# Patient Record
Sex: Female | Born: 1940 | Hispanic: No | Marital: Married | Smoking: Never smoker
Health system: Southern US, Community
[De-identification: ages and names within clinical notes are randomized; demographics above are authoritative.]

## PROBLEM LIST (undated history)

## (undated) DIAGNOSIS — I10 Essential (primary) hypertension: Secondary | ICD-10-CM

## (undated) DIAGNOSIS — E119 Type 2 diabetes mellitus without complications: Secondary | ICD-10-CM

## (undated) DIAGNOSIS — E7800 Pure hypercholesterolemia, unspecified: Secondary | ICD-10-CM

## (undated) DIAGNOSIS — E039 Hypothyroidism, unspecified: Secondary | ICD-10-CM

## (undated) DIAGNOSIS — I639 Cerebral infarction, unspecified: Secondary | ICD-10-CM

---

## 2000-01-09 ENCOUNTER — Ambulatory Visit (HOSPITAL_COMMUNITY): Admission: RE | Admit: 2000-01-09 | Discharge: 2000-01-09 | Payer: Self-pay | Admitting: *Deleted

## 2000-04-09 ENCOUNTER — Other Ambulatory Visit: Admission: RE | Admit: 2000-04-09 | Discharge: 2000-04-09 | Payer: Self-pay | Admitting: Family Medicine

## 2003-02-09 ENCOUNTER — Emergency Department (HOSPITAL_COMMUNITY): Admission: EM | Admit: 2003-02-09 | Discharge: 2003-02-09 | Payer: Self-pay | Admitting: Emergency Medicine

## 2003-02-10 ENCOUNTER — Ambulatory Visit: Admission: RE | Admit: 2003-02-10 | Discharge: 2003-02-10 | Payer: Self-pay | Admitting: Emergency Medicine

## 2004-03-30 ENCOUNTER — Encounter: Admission: RE | Admit: 2004-03-30 | Discharge: 2004-03-30 | Payer: Self-pay | Admitting: Internal Medicine

## 2005-02-14 ENCOUNTER — Encounter: Admission: RE | Admit: 2005-02-14 | Discharge: 2005-02-14 | Payer: Self-pay | Admitting: Family Medicine

## 2005-02-24 ENCOUNTER — Encounter: Admission: RE | Admit: 2005-02-24 | Discharge: 2005-02-24 | Payer: Self-pay | Admitting: Internal Medicine

## 2006-01-25 ENCOUNTER — Encounter: Admission: RE | Admit: 2006-01-25 | Discharge: 2006-01-25 | Payer: Self-pay | Admitting: Internal Medicine

## 2006-03-05 ENCOUNTER — Encounter: Admission: RE | Admit: 2006-03-05 | Discharge: 2006-03-05 | Payer: Self-pay | Admitting: Internal Medicine

## 2010-05-02 ENCOUNTER — Ambulatory Visit: Payer: Self-pay | Admitting: Vascular Surgery

## 2010-08-07 ENCOUNTER — Encounter: Payer: Self-pay | Admitting: Internal Medicine

## 2010-11-29 NOTE — Consult Note (Signed)
NEW PATIENT CONSULTATION   Jill Rose, Jill Rose  DOB:  Aug 01, 1940                                       05/02/2010  ZOXWR#:[id number]   Patient is a 70 year old female patient referred by Dr. Ralene Ok for  a swollen left leg.  She has a history of eczema in both lower  extremities, left worse than right, for the past 19 years and has been  treated by multiple dermatologists with periodic injections and topical  creams for exacerbations.  She has been having swelling in the left leg  for the last 3 to 4 years, greater than the right leg.  She has no  history of varicose veins, deep venous thrombosis, thrombophlebitis,  stasis ulcers, bleeding, or other known venous issues.  She also has a  history of PE.  She does not wear elastic compression stockings nor  elevate her legs at night and states that in the morning the swelling  has resolved but comes back as the day progresses.   CHRONIC MEDICAL PROBLEMS:  1. Hypothyroidism.  2. Eczema in bilateral lower extremities.  3. Chronic edema, left leg.  4. Negative for diabetes, hypertension, hyperlipidemia, coronary      artery disease, COPD, or stroke.   SOCIAL HISTORY:  She is married, has 3 children, is retired.  She does  not use tobacco or alcohol.   FAMILY HISTORY:  Positive for stroke in her father.  Negative for  coronary disease and diabetes.   REVIEW OF SYSTEMS:  Positive for left ankle discomfort which she  attributes to swelling as the day progresses.  Denies any chest pain,  dyspnea on exertion, PND, orthopnea.  No wheezing.  All other systems in  review of systems are negative.   PHYSICAL EXAMINATION:  Blood pressure 183/88, heart rate 79,  respirations 16.  Generally, she is a well-developed, well-nourished  female in no apparent distress, alert and oriented x3.  HEENT:  Normal  for age.  EOMs intact.  Lungs:  Clear to auscultation.  No rhonchi or  wheezing.  Cardiovascular:  Regular rhythm.  No  murmurs.  Carotid pulses  are 3+.  No bruits.  Abdomen is soft, nontender with no masses palpable.  Musculoskeletal exam is free of major deformities.  Neurologic exam is  normal.  Skin exam reveals 2 large patches of eczema in the left leg,  worse than right.  Left beginning just below the knee, extending  anteriorly along the pretibial area to the lateral malleolus over about  a 20 x 6 cm area.  The right leg has the same distribution but to a less  degree, about 10 x 6 cm.  She has active ulcerations.  There is mild  edema in the left ankle.  She does have 3+ femoral, popliteal, and  dorsalis pedis pulses bilaterally.  I see no bulging varicosities.   Today I ordered a venous duplex exam which I reviewed and interpreted.  Her deep systems are widely patent with some mild reflux in the deep  system.  She has some mild reflux at the saphenofemoral junctions  bilaterally but her saphenous veins are small, and there is some mild  reflux in the left small saphenous vein.   I do not think her venous disease is anything severe enough to require  treatment with laser ablation.  I have recommended elastic  compression  stockings (short-leg) as well as elevation of the legs at night to treat  these symptoms.  If she develops varicosities in the future or swelling  becomes worse we can reassess things at that time.     [name], M.D.  Electronically Signed   JDL/MEDQ  D:  05/02/2010  T:  05/03/2010  Job:  [id number]

## 2010-11-29 NOTE — Procedures (Signed)
LOWER EXTREMITY VENOUS REFLUX EXAM   INDICATION:   EXAM:  Using color-flow imaging and pulse Doppler spectral analysis, the  right and left common femoral, superficial femoral, popliteal, posterior  tibial, greater and lesser saphenous veins are evaluated.  There is  evidence suggesting deep venous insufficiency in the right and left  lower extremities mildly.   The right and left saphenofemoral junctions are not competent with  reflux of >555milliseconds. The right and left GSV's are not competent  with reflux of >526milliseconds with the caliber as described below.   The left proximal short saphenous vein demonstrates incompetency.   GSV Diameter (used if found to be incompetent only)                                            Right    Left  Proximal Greater Saphenous Vein           53 cm    55 cm  Proximal-to-mid-thigh                     cm       cm  Mid thigh                                 25 cm    41 cm  Mid-distal thigh                          cm       cm  Distal thigh                              32 cm    45 cm  Knee                                      cm       cm   IMPRESSION:  1. Right and left greater saphenous vein is not competent with reflux      with >567milliseconds.  2. The right and left greater saphenous vein is not tortuous.  3. The deep venous system is not competent with reflux of      >538milliseconds.  4. The left short saphenous vein is not competent with reflux of      >581milliseconds.   ___________________________________________  [name], M.D.   OD/MEDQ  D:  05/02/2010  T:  05/02/2010  Job:  [id number]

## 2011-12-21 ENCOUNTER — Other Ambulatory Visit: Payer: Self-pay | Admitting: Internal Medicine

## 2011-12-21 DIAGNOSIS — Z1231 Encounter for screening mammogram for malignant neoplasm of breast: Secondary | ICD-10-CM

## 2012-01-23 ENCOUNTER — Ambulatory Visit: Payer: Self-pay

## 2012-01-24 ENCOUNTER — Ambulatory Visit: Payer: Self-pay

## 2012-05-15 ENCOUNTER — Encounter: Payer: Self-pay | Admitting: Internal Medicine

## 2013-10-13 ENCOUNTER — Other Ambulatory Visit: Payer: Self-pay | Admitting: Internal Medicine

## 2013-10-13 DIAGNOSIS — N23 Unspecified renal colic: Secondary | ICD-10-CM

## 2013-10-13 DIAGNOSIS — Z1231 Encounter for screening mammogram for malignant neoplasm of breast: Secondary | ICD-10-CM

## 2013-10-13 DIAGNOSIS — R52 Pain, unspecified: Secondary | ICD-10-CM

## 2013-10-17 ENCOUNTER — Ambulatory Visit
Admission: RE | Admit: 2013-10-17 | Discharge: 2013-10-17 | Disposition: A | Discharge status: Home / Self Care | Payer: Medicare HMO | Source: Ambulatory Visit | Attending: Internal Medicine | Admitting: Internal Medicine

## 2013-10-17 ENCOUNTER — Other Ambulatory Visit: Payer: Self-pay | Admitting: Internal Medicine

## 2013-10-17 DIAGNOSIS — I10 Essential (primary) hypertension: Secondary | ICD-10-CM

## 2013-10-17 DIAGNOSIS — N23 Unspecified renal colic: Secondary | ICD-10-CM

## 2013-10-17 DIAGNOSIS — R52 Pain, unspecified: Secondary | ICD-10-CM

## 2013-10-31 ENCOUNTER — Ambulatory Visit
Admission: RE | Admit: 2013-10-31 | Discharge: 2013-10-31 | Disposition: A | Discharge status: Home / Self Care | Payer: Medicare HMO | Source: Ambulatory Visit | Attending: Internal Medicine | Admitting: Internal Medicine

## 2013-10-31 DIAGNOSIS — Z1231 Encounter for screening mammogram for malignant neoplasm of breast: Secondary | ICD-10-CM

## 2014-12-30 ENCOUNTER — Other Ambulatory Visit: Payer: Self-pay | Admitting: Internal Medicine

## 2014-12-30 DIAGNOSIS — R0989 Other specified symptoms and signs involving the circulatory and respiratory systems: Secondary | ICD-10-CM

## 2015-01-04 ENCOUNTER — Other Ambulatory Visit: Payer: Medicare HMO

## 2016-04-26 ENCOUNTER — Ambulatory Visit: Payer: Medicare HMO | Admitting: Family Medicine

## 2016-07-17 DIAGNOSIS — G459 Transient cerebral ischemic attack, unspecified: Secondary | ICD-10-CM

## 2016-07-17 HISTORY — DX: Transient cerebral ischemic attack, unspecified: G45.9

## 2017-01-26 ENCOUNTER — Inpatient Hospital Stay (HOSPITAL_COMMUNITY)
Admission: EM | Admit: 2017-01-26 | Discharge: 2017-01-28 | DRG: 066 | Disposition: A | Discharge status: Home Health | Payer: Medicare HMO | Attending: Internal Medicine | Admitting: Internal Medicine

## 2017-01-26 ENCOUNTER — Emergency Department (HOSPITAL_COMMUNITY): Payer: Medicare HMO

## 2017-01-26 ENCOUNTER — Encounter (HOSPITAL_COMMUNITY): Payer: Self-pay | Admitting: Internal Medicine

## 2017-01-26 DIAGNOSIS — Z7982 Long term (current) use of aspirin: Secondary | ICD-10-CM

## 2017-01-26 DIAGNOSIS — Z8249 Family history of ischemic heart disease and other diseases of the circulatory system: Secondary | ICD-10-CM

## 2017-01-26 DIAGNOSIS — I160 Hypertensive urgency: Secondary | ICD-10-CM | POA: Diagnosis present

## 2017-01-26 DIAGNOSIS — I161 Hypertensive emergency: Secondary | ICD-10-CM | POA: Diagnosis present

## 2017-01-26 DIAGNOSIS — G459 Transient cerebral ischemic attack, unspecified: Secondary | ICD-10-CM

## 2017-01-26 DIAGNOSIS — R4701 Aphasia: Secondary | ICD-10-CM | POA: Diagnosis not present

## 2017-01-26 DIAGNOSIS — I6523 Occlusion and stenosis of bilateral carotid arteries: Secondary | ICD-10-CM | POA: Diagnosis present

## 2017-01-26 DIAGNOSIS — I639 Cerebral infarction, unspecified: Secondary | ICD-10-CM | POA: Diagnosis not present

## 2017-01-26 DIAGNOSIS — E039 Hypothyroidism, unspecified: Secondary | ICD-10-CM | POA: Diagnosis not present

## 2017-01-26 DIAGNOSIS — R4781 Slurred speech: Secondary | ICD-10-CM | POA: Diagnosis present

## 2017-01-26 DIAGNOSIS — I10 Essential (primary) hypertension: Secondary | ICD-10-CM | POA: Diagnosis present

## 2017-01-26 DIAGNOSIS — R29701 NIHSS score 1: Secondary | ICD-10-CM | POA: Diagnosis not present

## 2017-01-26 DIAGNOSIS — R29700 NIHSS score 0: Secondary | ICD-10-CM | POA: Diagnosis present

## 2017-01-26 DIAGNOSIS — E119 Type 2 diabetes mellitus without complications: Secondary | ICD-10-CM

## 2017-01-26 DIAGNOSIS — R29810 Facial weakness: Secondary | ICD-10-CM | POA: Diagnosis present

## 2017-01-26 HISTORY — DX: Essential (primary) hypertension: I10

## 2017-01-26 HISTORY — DX: Hypothyroidism, unspecified: E03.9

## 2017-01-26 HISTORY — DX: Type 2 diabetes mellitus without complications: E11.9

## 2017-01-26 LAB — COMPREHENSIVE METABOLIC PANEL
ALT: 14 U/L (ref 14–54)
AST: 21 U/L (ref 15–41)
Albumin: 3.7 g/dL (ref 3.5–5.0)
Alkaline Phosphatase: 53 U/L (ref 38–126)
Anion gap: 9 (ref 5–15)
BUN: 12 mg/dL (ref 6–20)
CO2: 27 mmol/L (ref 22–32)
Calcium: 9.1 mg/dL (ref 8.9–10.3)
Chloride: 101 mmol/L (ref 101–111)
Creatinine, Ser: 0.67 mg/dL (ref 0.44–1.00)
GFR calc Af Amer: >60 mL/min (ref >60)
GFR calc non Af Amer: >60 mL/min (ref >60)
Glucose, Bld: 174 mg/dL — ABNORMAL HIGH (ref 65–99)
Potassium: 4.7 mmol/L (ref 3.5–5.1)
Sodium: 137 mmol/L (ref 135–145)
Total Bilirubin: 0.4 mg/dL (ref 0.3–1.2)
Total Protein: 7.9 g/dL (ref 6.5–8.1)

## 2017-01-26 LAB — I-STAT TROPONIN, ED: Troponin i, poc: 0.01 ng/mL (ref 0.00–0.08)

## 2017-01-26 LAB — DIFFERENTIAL
Basophils Absolute: 0 10*3/uL (ref 0.0–0.1)
Basophils Relative: 0 %
Eosinophils Absolute: 0.5 10*3/uL (ref 0.0–0.7)
Eosinophils Relative: 5 %
Lymphocytes Relative: 44 %
Lymphs Abs: 4.4 10*3/uL — ABNORMAL HIGH (ref 0.7–4.0)
Monocytes Absolute: 0.7 10*3/uL (ref 0.1–1.0)
Monocytes Relative: 7 %
Neutro Abs: 4.3 10*3/uL (ref 1.7–7.7)
Neutrophils Relative %: 44 %

## 2017-01-26 LAB — I-STAT CHEM 8, ED
BUN: 11 mg/dL (ref 6–20)
Calcium, Ion: 1.18 mmol/L (ref 1.15–1.40)
Chloride: 101 mmol/L (ref 101–111)
Creatinine, Ser: 0.6 mg/dL (ref 0.44–1.00)
Glucose, Bld: 170 mg/dL — ABNORMAL HIGH (ref 65–99)
HCT: 39 % (ref 36.0–46.0)
Hemoglobin: 13.3 g/dL (ref 12.0–15.0)
Potassium: 4.6 mmol/L (ref 3.5–5.1)
Sodium: 139 mmol/L (ref 135–145)
TCO2: 29 mmol/L (ref 0–100)

## 2017-01-26 LAB — RAPID URINE DRUG SCREEN, HOSP PERFORMED
Amphetamines: NOT DETECTED
Barbiturates: NOT DETECTED
Benzodiazepines: NOT DETECTED
Cocaine: NOT DETECTED
Opiates: NOT DETECTED
Tetrahydrocannabinol: NOT DETECTED

## 2017-01-26 LAB — CBC
HCT: 38.2 % (ref 36.0–46.0)
Hemoglobin: 12.3 g/dL (ref 12.0–15.0)
MCH: 27.3 pg (ref 26.0–34.0)
MCHC: 32.2 g/dL (ref 30.0–36.0)
MCV: 84.7 fL (ref 78.0–100.0)
Platelets: 247 10*3/uL (ref 150–400)
RBC: 4.51 MIL/uL (ref 3.87–5.11)
RDW: 15.4 % (ref 11.5–15.5)
WBC: 9.8 10*3/uL (ref 4.0–10.5)

## 2017-01-26 LAB — PROTIME-INR
INR: 0.93
Prothrombin Time: 12.5 seconds (ref 11.4–15.2)

## 2017-01-26 LAB — CBG MONITORING, ED
Glucose-Capillary: 182 mg/dL — ABNORMAL HIGH (ref 65–99)
Glucose-Capillary: 220 mg/dL — ABNORMAL HIGH (ref 65–99)

## 2017-01-26 LAB — APTT: aPTT: 34 seconds (ref 24–36)

## 2017-01-26 MED ORDER — ACETAMINOPHEN 160 MG/5ML PO SOLN: 650.0000 mg | ORAL | Status: DC | PRN

## 2017-01-26 MED ORDER — NICARDIPINE HCL IN NACL 20-0.86 MG/200ML-% IV SOLN: 3.0000 mg/h | INTRAVENOUS | Status: DC

## 2017-01-26 MED ORDER — ACETAMINOPHEN 325 MG PO TABS: 650.0000 mg | ORAL_TABLET | ORAL | Status: DC | PRN

## 2017-01-26 MED ORDER — HYDRALAZINE HCL 20 MG/ML IJ SOLN
10.0000 mg | INTRAMUSCULAR | Status: AC
  Administered 2017-01-26: 10 mg via INTRAVENOUS
  Filled 2017-01-26: qty 0.5

## 2017-01-26 MED ORDER — ONDANSETRON HCL 4 MG/2ML IJ SOLN: 4.0000 mg | Freq: Three times a day (TID) | INTRAMUSCULAR | Status: DC | PRN

## 2017-01-26 MED ORDER — ENOXAPARIN SODIUM 40 MG/0.4ML ~~LOC~~ SOLN
40.0000 mg | SUBCUTANEOUS | Status: DC
  Administered 2017-01-27 – 2017-01-28 (×2): 40 mg via SUBCUTANEOUS
  Filled 2017-01-26 (×2): qty 0.4

## 2017-01-26 MED ORDER — LISINOPRIL 10 MG PO TABS: 10.0000 mg | ORAL_TABLET | Freq: Every day | ORAL | Status: DC

## 2017-01-26 MED ORDER — ASPIRIN 300 MG RE SUPP: 300.0000 mg | Freq: Every day | RECTAL | Status: DC

## 2017-01-26 MED ORDER — ZOLPIDEM TARTRATE 5 MG PO TABS
5.0000 mg | ORAL_TABLET | Freq: Every evening | ORAL | Status: DC | PRN
Start: 2017-01-26 — End: 2017-01-28

## 2017-01-26 MED ORDER — LISINOPRIL 10 MG PO TABS
10.0000 mg | ORAL_TABLET | Freq: Every day | ORAL | Status: DC
  Administered 2017-01-27 – 2017-01-28 (×3): 10 mg via ORAL
  Filled 2017-01-26 (×3): qty 1

## 2017-01-26 MED ORDER — INSULIN ASPART 100 UNIT/ML ~~LOC~~ SOLN
0.0000 [IU] | Freq: Every day | SUBCUTANEOUS | Status: DC
  Administered 2017-01-27: 2 [IU] via SUBCUTANEOUS
  Filled 2017-01-26: qty 1

## 2017-01-26 MED ORDER — ACETAMINOPHEN 650 MG RE SUPP: 650.0000 mg | RECTAL | Status: DC | PRN

## 2017-01-26 MED ORDER — INSULIN ASPART 100 UNIT/ML ~~LOC~~ SOLN
0.0000 [IU] | Freq: Three times a day (TID) | SUBCUTANEOUS | Status: DC
  Administered 2017-01-27: 3 [IU] via SUBCUTANEOUS
  Administered 2017-01-27 (×2): 2 [IU] via SUBCUTANEOUS
  Administered 2017-01-28: 3 [IU] via SUBCUTANEOUS
  Administered 2017-01-28: 5 [IU] via SUBCUTANEOUS

## 2017-01-26 MED ORDER — STROKE: EARLY STAGES OF RECOVERY BOOK
Freq: Once | Status: DC
  Filled 2017-01-26: qty 1

## 2017-01-26 MED ORDER — ASPIRIN 325 MG PO TABS
325.0000 mg | ORAL_TABLET | Freq: Every day | ORAL | Status: DC
  Administered 2017-01-27 – 2017-01-28 (×2): 325 mg via ORAL
  Filled 2017-01-26 (×2): qty 1

## 2017-01-26 MED ORDER — SENNOSIDES-DOCUSATE SODIUM 8.6-50 MG PO TABS: 1.0000 | ORAL_TABLET | Freq: Every evening | ORAL | Status: DC | PRN

## 2017-01-26 NOTE — ED Notes (Signed)
Pt had drawn for labs: blue

## 2017-01-26 NOTE — ED Notes (Signed)
EKG brought back from triage with patient and put in room. EKG given to Jeraldine LootsLockwood, MD by Clinical research associatewriter.

## 2017-01-26 NOTE — ED Triage Notes (Signed)
Pt c/o slurred speech onset yesterday at 1900, currently resolved. Patient has left lower face droop, family member states this is new. No confusion, vision changes, weakness, numbness, dizziness. No new symptoms today.  Right lower facial droop. CN II-XII otherwise intact to exam. Sensation intact and symmetrical bilaterally to exam.

## 2017-01-26 NOTE — ED Provider Notes (Signed)
[facility]-EMERGENCY DEPT Provider Note   CSN: [id number] Arrival date & time: 01/26/17  1611     History   Chief Complaint Chief Complaint  Patient presents with  . Aphasia    HPI Jill Rose is a 76 y.o. female with a past medical history significant for hypothyroidism and T2DM who presents today from PCP office with resolved episode of slurred speech and facial droop. Patient reports she was watching TV yesterday at home when she was trying to answer her husband in her native language and had some difficulty with her speech that she describes as having a "heavy tongue". Patient also reports that her husband noticed some right sided facial droop. Patient report she took 3 baby aspirin and went to bed. She did not have any other symptoms. This morning, patient was still having some mild slurred speech and after talking to a friend decided to go to her PCP. At her PCP, patient had elevated BP with a SBP in the 190. After describing her symptoms was told to go to the ED for a head CT. Patent denies any shortness of breath, chest pain, difficulty with gait, vision changes, weakness in her upper or lower extremities, no urinary or bowel incontinence.  HPI  Past Medical History:  Diagnosis Date  . Diabetes mellitus without complication (HCC)   . Essential hypertension   . Hypothyroidism     Patient Active Problem List   Diagnosis Date Noted  . Hypertensive emergency 01/26/2017  . TIA (transient ischemic attack) 01/26/2017  . Hypothyroidism 01/26/2017  . Aphasia   . Essential hypertension   . Diabetes mellitus without complication (HCC)     History reviewed. No pertinent surgical history.  OB History    No data available       Home Medications    Prior to Admission medications   Medication Sig Start Date End Date Taking? Authorizing Provider  aspirin EC 81 MG tablet Take 81 mg by mouth daily.   Yes [provider]  Barberry-Oreg Grape-Goldenseal (BERBERINE COMPLEX  PO) Take 500 mg by mouth 3 (three) times daily.   Yes [provider]  Cholecalciferol (VITAMIN D3) 10000 units TABS Take 1 tablet by mouth daily.   Yes [provider]  Coenzyme Q10 (CO Q-10 PO) Take 1 capsule by mouth daily.   Yes [provider]  levothyroxine (SYNTHROID, LEVOTHROID) 150 MCG tablet Take 150 mcg by mouth daily before breakfast.  01/20/17  Yes [provider]  Magnesium 500 MG CAPS Take 1 capsule by mouth daily.   Yes [provider]    Family History Family History  Problem Relation Age of Onset  . Heart disease Mother     Social History Social History  Substance Use Topics  . Smoking status: Never Smoker  . Smokeless tobacco: Never Used  . Alcohol use No     Allergies   Ammonia; Peroxide [hydrogen peroxide]; and Ammonium-containing compounds   Review of Systems Review of Systems  Constitutional: Negative.   HENT: Negative.   Eyes: Negative.   Respiratory: Negative.   Cardiovascular: Negative.   Gastrointestinal: Negative.   Endocrine: Negative.   Genitourinary: Negative.   Musculoskeletal: Negative.   Skin: Negative.   Allergic/Immunologic: Negative.   Neurological: Negative.   Hematological: Negative.   Psychiatric/Behavioral: Negative.      Physical Exam Updated Vital Signs BP (!) 208/67 (BP Location: Left Arm)   Pulse (!) 111   Temp 98.3 F (36.8 C) (Oral)   Resp 19  SpO2 98%   Physical Exam  Constitutional: She is oriented to person, place, and time. She appears well-developed and well-nourished.  HENT:  Head: Normocephalic and atraumatic.  Eyes: Pupils are equal, round, and reactive to light. EOM are normal.  Neck: Normal range of motion. Neck supple.  Cardiovascular: Normal rate and regular rhythm.   Pulmonary/Chest: Effort normal and breath sounds normal.  Abdominal: Soft. Bowel sounds are normal.  Musculoskeletal: Normal range of motion.  Neurological: She is alert and oriented to  person, place, and time.  Psychiatric: She has a normal mood and affect. Her behavior is normal.     ED Treatments / Results  Labs (all labs ordered are listed, but only abnormal results are displayed) Labs Reviewed  DIFFERENTIAL - Abnormal; Notable for the following:       Result Value   Lymphs Abs 4.4 (*)    All other components within normal limits  COMPREHENSIVE METABOLIC PANEL - Abnormal; Notable for the following:    Glucose, Bld 174 (*)    All other components within normal limits  CBG MONITORING, ED - Abnormal; Notable for the following:    Glucose-Capillary 182 (*)    All other components within normal limits  I-STAT CHEM 8, ED - Abnormal; Notable for the following:    Glucose, Bld 170 (*)    All other components within normal limits  CBC  PROTIME-INR  APTT  RAPID URINE DRUG SCREEN, HOSP PERFORMED  TSH  HEMOGLOBIN A1C  LIPID PANEL  I-STAT TROPOININ, ED    EKG  EKG Interpretation  Date/Time:  Friday January 26 2017 16:20:05 EDT Ventricular Rate:  81 PR Interval:    QRS Duration: 75 QT Interval:  399 QTC Calculation: 464 R Axis:   7 Text Interpretation:  Sinus rhythm Consider anterior infarct T wave abnormality Artifact Baseline wander Abnormal ekg Confirmed by Gerhard MunchLockwood, Robert [phone]([id number]) on 01/26/2017 4:37:11 PM       Radiology Ct Head Wo Contrast  Result Date: 01/26/2017 CLINICAL DATA:  Slurred speech beginning at 17 p.m. today. No known injury. EXAM: CT HEAD WITHOUT CONTRAST TECHNIQUE: Contiguous axial images were obtained from the base of the skull through the vertex without intravenous contrast. COMPARISON:  None. FINDINGS: Brain: There is mild cortical atrophy and chronic microvascular ischemic change. No evidence of acute abnormality including hemorrhage, infarct, mass lesion, mass effect, midline shift or abnormal extra-axial fluid collection. No hydrocephalus or pneumocephalus. Vascular: Extensive atherosclerotic vascular disease is seen. Skull: Intact.  Sinuses/Orbits: Negative. Other: None. IMPRESSION: No acute abnormality. Mild atrophy and chronic microvascular ischemic change. Atherosclerosis. Electronically Signed   By: Drusilla Kannerhomas  Dalessio M.D.   On: 01/26/2017 17:35    Procedures Procedures (including critical care time)  Medications Ordered in ED Medications  nicardipine (CARDENE) 20mg  in 0.86% saline 200ml IV infusion (0.1 mg/ml) (not administered)  insulin aspart (novoLOG) injection 0-9 Units (not administered)  insulin aspart (novoLOG) injection 0-5 Units (not administered)   stroke: mapping our early stages of recovery book (not administered)  acetaminophen (TYLENOL) tablet 650 mg (not administered)    Or  acetaminophen (TYLENOL) solution 650 mg (not administered)    Or  acetaminophen (TYLENOL) suppository 650 mg (not administered)  senna-docusate (Senokot-S) tablet 1 tablet (not administered)  enoxaparin (LOVENOX) injection 40 mg (not administered)  aspirin suppository 300 mg (not administered)    Or  aspirin tablet 325 mg (not administered)  ondansetron (ZOFRAN) injection 4 mg (not administered)  zolpidem (AMBIEN) tablet 5 mg (not administered)  lisinopril (PRINIVIL,ZESTRIL) tablet  10 mg (not administered)  hydrALAZINE (APRESOLINE) injection 10 mg (10 mg Intravenous Given 01/26/17 1942)     Initial Impression / Assessment and Plan / ED Course  Patient is 76 yo female who presented with resolved slurred speech, facial droop and hypertension concerning for TIA. Head CT showed chronic microvascular ischemic change but no other acute findings. Neurologic exam is within normal limit with no focal deficit. Symptoms have essentially resolved except for significantly elevated BP 248/86. Patient was started on hydralazine though discussed permissive hypertension. Case was discussed with Neurology who recommended admission for TIA work up. Hospitalist Service was consulted and will admit. Atherosclerosis. I have reviewed the triage  vital signs and the nursing notes.  Pertinent labs & imaging results that were available during my care of the patient were reviewed by me and considered in my medical decision making (see chart for details).     Final Clinical Impressions(s) / ED Diagnoses   Final diagnoses:  Aphasia  Hypertensive urgency  Slurred speech  Essential hypertension  Diabetes mellitus without complication [facility])    New Prescriptions New Prescriptions   No medications on file     Lovena Neighbours, MD 01/26/17 2147    Gerhard Munch, MD 01/28/17 [phone]

## 2017-01-26 NOTE — ED Notes (Signed)
Pt had drawn for labs: Gold Lavender Lt green Dark green

## 2017-01-26 NOTE — ED Notes (Signed)
Patient transported to CT 

## 2017-01-26 NOTE — H&P (Signed)
History and Physical    ARLEEN BAR ZOX:[id number] DOB: 05-11-1941 DOA: 01/26/2017  Referring MD/NP/PA:   PCP: Patient, No Pcp Per   Patient coming from:  The patient is coming from home.  At baseline, pt is independent for most of ADL.   Chief Complaint: Slurred speech  HPI: EILEENE KISLING is a 76 y.o. female with medical history significant of hypertension, diabetes mellitus, hypothyroidism, who presents with slurred speech.   Pt states that she had slurred speech that happened at about 19:00 Yesterday. She took 3 pills of 81 mg ASA and went to bed, her symptoms has resolved in the morning when she woke up. Patient denies unilateral weakness, numbness, vision change or hearing loss. She was noted to have right facial droop.  Patient does not have chest pain, shortness breath, cough, fever, chills, nausea, vomiting, diarrhea, abdominal pain, symptoms of UTI. Patient states that she has hx of diabetes, is taking nature medication 'Berberine' which is equivalent to metformin per her doctor in [place]. She has hx of hypertension, but currently not taking medications.   ED Course: pt was found to have elevated blood pressure 248/86, WBC 9.8, INR 0.93, electrolytes and rnal function okay, temperature normal, no tachycardia, oxygen saturation 100% on room air, negative CT head for acute intracranial abnormalities. Patient is placed on stepdown bed for observation. Neurology, Dr. Wilford Corner was consulted.  Review of Systems:   General: no fevers, chills, no changes in body weight, has fatigue HEENT: no blurry vision, hearing changes or sore throat Respiratory: no dyspnea, coughing, wheezing CV: no chest pain, no palpitations GI: no nausea, vomiting, abdominal pain, diarrhea, constipation GU: no dysuria, burning on urination, increased urinary frequency, hematuria  Ext: no leg edema Neuro: has slurred speech and right facial droop. No unilateral weakness, numbness, or tingling in extremities. No  vision change or hearing loss Skin: no rash, no skin tear. MSK: No muscle spasm, no deformity, no limitation of range of movement in spin Heme: No easy bruising.  Travel history: No recent long distant travel.  Allergy:  Allergies  Allergen Reactions  . Ammonia Shortness Of Breath, Itching and Swelling  . Peroxide [Hydrogen Peroxide] Shortness Of Breath, Itching and Swelling  . Ammonium-Containing Compounds     Past Medical History:  Diagnosis Date  . Diabetes mellitus without complication (HCC)   . Essential hypertension   . Hypothyroidism     History reviewed. No pertinent surgical history.  Social History:  reports that she has never smoked. She has never used smokeless tobacco. She reports that she does not drink alcohol or use drugs.  Family History:  Family History  Problem Relation Age of Onset  . Heart disease Mother      Prior to Admission medications   Medication Sig Start Date End Date Taking? Authorizing Provider  aspirin EC 81 MG tablet Take 81 mg by mouth daily.   Yes [provider]  Barberry-Oreg Grape-Goldenseal (BERBERINE COMPLEX PO) Take 500 mg by mouth 3 (three) times daily.   Yes [provider]  Cholecalciferol (VITAMIN D3) 10000 units TABS Take 1 tablet by mouth daily.   Yes [provider]  Coenzyme Q10 (CO Q-10 PO) Take 1 capsule by mouth daily.   Yes [provider]  levothyroxine (SYNTHROID, LEVOTHROID) 150 MCG tablet Take 150 mcg by mouth daily before breakfast.  01/20/17  Yes [provider]  Magnesium 500 MG CAPS Take 1 capsule by mouth daily.   Yes [provider]  Physical Exam: Vitals:   01/26/17 1915 01/26/17 1930 01/26/17 1942 01/26/17 2025  BP: (!) 227/86 (!) 248/86 (!) 248/86 (!) 209/66  Pulse: 77 80  (!) 115  Resp: 16 16  (!) 22  Temp:    (!) 97.4 F (36.3 C)  TempSrc:    Oral  SpO2: 100% 99%  99%   General: Not in acute distress HEENT:       Eyes: PERRL, EOMI, no scleral  icterus.       ENT: No discharge from the ears and nose, no pharynx injection, no tonsillar enlargement.        Neck: No JVD, no bruit, no mass felt. Heme: No neck lymph node enlargement. Cardiac: S1/S2, RRR, No murmurs, No gallops or rubs. Respiratory:  No rales, wheezing, rhonchi or rubs. GI: Soft, nondistended, nontender, no rebound pain, no organomegaly, BS present. GU: No hematuria Ext: No pitting leg edema bilaterally. 2+DP/PT pulse bilaterally. Musculoskeletal: No joint deformities, No joint redness or warmth, no limitation of ROM in spin. Skin: No rashes.  Neuro: Alert, oriented X3, cranial nerves II-XII grossly intact except for mild right facial droop,  moves all extremities normally. Muscle strength 5/5 in all extremities, sensation to light touch intact. Brachial reflex 2+ bilaterally. Negative Babinski's sign. Normal finger to nose test. Psych: Patient is not psychotic, no suicidal or hemocidal ideation.  Labs on Admission: I have personally reviewed following labs and imaging studies  CBC:  Recent Labs Lab 01/26/17 1702 01/26/17 1747  WBC 9.8  --   NEUTROABS 4.3  --   HGB 12.3 13.3  HCT 38.2 39.0  MCV 84.7  --   PLT 247  --    Basic Metabolic Panel:  Recent Labs Lab 01/26/17 1744 01/26/17 1747  NA 137 139  K 4.7 4.6  CL 101 101  CO2 27  --   GLUCOSE 174* 170*  BUN 12 11  CREATININE 0.67 0.60  CALCIUM 9.1  --    GFR: CrCl cannot be calculated (Unknown ideal weight.). Liver Function Tests:  Recent Labs Lab 01/26/17 1744  AST 21  ALT 14  ALKPHOS 53  BILITOT 0.4  PROT 7.9  ALBUMIN 3.7   No results for input(s): LIPASE, AMYLASE in the last 168 hours. No results for input(s): AMMONIA in the last 168 hours. Coagulation Profile:  Recent Labs Lab 01/26/17 1800  INR 0.93   Cardiac Enzymes: No results for input(s): CKTOTAL, CKMB, CKMBINDEX, TROPONINI in the last 168 hours. BNP (last 3 results) No results for input(s): PROBNP in the last 8760  hours. HbA1C: No results for input(s): HGBA1C in the last 72 hours. CBG:  Recent Labs Lab 01/26/17 1657  GLUCAP 182*   Lipid Profile: No results for input(s): CHOL, HDL, LDLCALC, TRIG, CHOLHDL, LDLDIRECT in the last 72 hours. Thyroid Function Tests: No results for input(s): TSH, T4TOTAL, FREET4, T3FREE, THYROIDAB in the last 72 hours. Anemia Panel: No results for input(s): VITAMINB12, FOLATE, FERRITIN, TIBC, IRON, RETICCTPCT in the last 72 hours. Urine analysis: No results found for: COLORURINE, APPEARANCEUR, LABSPEC, PHURINE, GLUCOSEU, HGBUR, BILIRUBINUR, KETONESUR, PROTEINUR, UROBILINOGEN, NITRITE, LEUKOCYTESUR Sepsis Labs: @LABRCNTIP (procalcitonin:4,lacticidven:4) )No results found for this or any previous visit (from the past 240 hour(s)).   Radiological Exams on Admission: Ct Head Wo Contrast  Result Date: 01/26/2017 CLINICAL DATA:  Slurred speech beginning at 17 p.m. today. No known injury. EXAM: CT HEAD WITHOUT CONTRAST TECHNIQUE: Contiguous axial images were obtained from the base of the skull through the vertex without intravenous contrast. COMPARISON:  None. FINDINGS: Brain: There is mild cortical atrophy and chronic microvascular ischemic change. No evidence of acute abnormality including hemorrhage, infarct, mass lesion, mass effect, midline shift or abnormal extra-axial fluid collection. No hydrocephalus or pneumocephalus. Vascular: Extensive atherosclerotic vascular disease is seen. Skull: Intact. Sinuses/Orbits: Negative. Other: None. IMPRESSION: No acute abnormality. Mild atrophy and chronic microvascular ischemic change. Atherosclerosis. Electronically Signed   By: Drusilla Kannerhomas  Dalessio M.D.   On: 01/26/2017 17:35     EKG: Independently reviewed.  Sinus rhythm, QTC 464, poor R-wave progression, nonspecific T-wave change.  Assessment/Plan Principal Problem:   Hypertensive emergency Active Problems:   TIA (transient ischemic attack)   Hypothyroidism   Essential  hypertension   Diabetes mellitus without complication (HCC)   Hypertensive emergency: Blood pressure elevated at 248/86, with slurred speech and right facial droop, indicating possible TIA. Mental status normal. No chest pain. Renal function okay. Pt received 10 mg of IV hydralazine ED, but the blood pressure is still elevated.   -will place on SDU for -start Cardene gtt targeting SBP 170-190 mmHg. -start Lisinopril 10 mg daily  Possible TIA: pt has right facial droop and slurred speech, her slurred speech has resolved. CT head is negative for acute intracranial abnormalities. Neurology, Dr. Wilford CornerArora was consulted by EDP.  - Neurology was consulted by EDP, will follow up recommendations. -Atrial fibrillation: not present  - Risk factor modification: HgbA1c, fasting lipid panel - MRI, MRA of the brain without contrast  - PT consult, OT consult - Bedside swallowing screen was ordered, will get speech consult in AM - 2 d Echocardiogram  - Carotid dopplers  - Aspirin 325 mg daily - check UDS  Hypothyroidism: Last TSH was not on record -Continue home Synthroid -Check TSH  Essential hypertension: not taking meds at home. Now has hypertensive emergency -See above  DM-II: Last A1c not on record. Patient states that she is taking nature medication 'Berberine' which is equivalent to metformin per her doctor in [place]. Blood sugar is 174. -SSI -Check A1c  DVT ppx: SQ Lovenox Code Status: Full code Family Communication:   Yes, patient's husband and son at bed side Disposition Plan:  Anticipate discharge back to previous home environment Consults called:  Neuro, dr. Wilford CornerArora Admission status:   SDU/obs  Date of Service 01/26/2017    Lorretta HarpNIU, Crispin Vogel [facility] Pager [phone]  If 7PM-7AM, please contact night-coverage [email or web address] Password [facility] 01/26/2017, 9:17 PM

## 2017-01-27 ENCOUNTER — Observation Stay (HOSPITAL_COMMUNITY): Payer: Medicare HMO

## 2017-01-27 ENCOUNTER — Encounter (HOSPITAL_COMMUNITY): Payer: Medicare HMO

## 2017-01-27 DIAGNOSIS — Z7982 Long term (current) use of aspirin: Secondary | ICD-10-CM | POA: Diagnosis not present

## 2017-01-27 DIAGNOSIS — I639 Cerebral infarction, unspecified: Secondary | ICD-10-CM | POA: Diagnosis present

## 2017-01-27 DIAGNOSIS — I161 Hypertensive emergency: Secondary | ICD-10-CM | POA: Diagnosis not present

## 2017-01-27 DIAGNOSIS — I63312 Cerebral infarction due to thrombosis of left middle cerebral artery: Secondary | ICD-10-CM | POA: Diagnosis not present

## 2017-01-27 DIAGNOSIS — I6523 Occlusion and stenosis of bilateral carotid arteries: Secondary | ICD-10-CM | POA: Diagnosis present

## 2017-01-27 DIAGNOSIS — I160 Hypertensive urgency: Secondary | ICD-10-CM

## 2017-01-27 DIAGNOSIS — E039 Hypothyroidism, unspecified: Secondary | ICD-10-CM | POA: Diagnosis present

## 2017-01-27 DIAGNOSIS — R4701 Aphasia: Secondary | ICD-10-CM | POA: Diagnosis present

## 2017-01-27 DIAGNOSIS — I10 Essential (primary) hypertension: Secondary | ICD-10-CM

## 2017-01-27 DIAGNOSIS — G459 Transient cerebral ischemic attack, unspecified: Secondary | ICD-10-CM | POA: Diagnosis not present

## 2017-01-27 DIAGNOSIS — R29701 NIHSS score 1: Secondary | ICD-10-CM | POA: Diagnosis present

## 2017-01-27 DIAGNOSIS — R29810 Facial weakness: Secondary | ICD-10-CM | POA: Diagnosis present

## 2017-01-27 DIAGNOSIS — Z8249 Family history of ischemic heart disease and other diseases of the circulatory system: Secondary | ICD-10-CM | POA: Diagnosis not present

## 2017-01-27 DIAGNOSIS — R29700 NIHSS score 0: Secondary | ICD-10-CM | POA: Diagnosis present

## 2017-01-27 DIAGNOSIS — E119 Type 2 diabetes mellitus without complications: Secondary | ICD-10-CM | POA: Diagnosis present

## 2017-01-27 DIAGNOSIS — R4781 Slurred speech: Secondary | ICD-10-CM | POA: Diagnosis present

## 2017-01-27 LAB — LIPID PANEL
Cholesterol: 237 mg/dL — ABNORMAL HIGH (ref 0–200)
HDL: 50 mg/dL (ref >40)
LDL Cholesterol: 168 mg/dL — ABNORMAL HIGH (ref 0–99)
Total CHOL/HDL Ratio: 4.7 RATIO
Triglycerides: 96 mg/dL (ref <150)
VLDL: 19 mg/dL (ref 0–40)

## 2017-01-27 LAB — GLUCOSE, CAPILLARY
Glucose-Capillary: 182 mg/dL — ABNORMAL HIGH (ref 65–99)
Glucose-Capillary: 219 mg/dL — ABNORMAL HIGH (ref 65–99)
Glucose-Capillary: 184 mg/dL — ABNORMAL HIGH (ref 65–99)
Glucose-Capillary: 196 mg/dL — ABNORMAL HIGH (ref 65–99)

## 2017-01-27 LAB — MRSA PCR SCREENING: MRSA by PCR: NEGATIVE

## 2017-01-27 LAB — TSH: TSH: 43.122 u[IU]/mL — ABNORMAL HIGH (ref 0.350–4.500)

## 2017-01-27 MED ORDER — ATORVASTATIN CALCIUM 40 MG PO TABS
40.0000 mg | ORAL_TABLET | Freq: Every day | ORAL | Status: DC
  Administered 2017-01-27: 40 mg via ORAL
  Filled 2017-01-27: qty 1

## 2017-01-27 NOTE — Progress Notes (Signed)
PT Cancellation Note  Patient Details Name: Jill Rose MRN: [id number] DOB: 12/02/1940   Cancelled Treatment:    Reason Eval/Treat Not Completed: Patient not medically ready Patient remains on bedrest per orders.   MD:  Please write activity orders when appropriate for patient.  PT will initiate evaluation at that time.  Thank you.  Vena AustriaSusan H Jahir Halt 01/27/2017, 12:56 PM Durenda HurtSusan H. Renaldo Fiddleravis, PT, [facility] Acute Rehab Services Pager [phone]'

## 2017-01-27 NOTE — ED Notes (Signed)
[facility] called for transport to [place] 

## 2017-01-27 NOTE — Consult Note (Signed)
Reason for Consult: Dysarthria Referring Physician: Dr. Myra Gianotti is an 76 y.o. female.  HPI: Thursday night, she developed acute onset of dysarthria.  She denies any weakness, numbness, or incoordination at any time.  No headaches.  No vertigo.  She normally takes ASA 162 mg per day, but took 243 mg instead and went to bed.  When she woke up in the morning, she still had dysarthria which is more noticeable if she spoke in her native language.  CT Brain is normal except for mild small vessel ischemic disease.  She has a history of HTN and DM, but does not take medication or takes herbal supplements for them.  She was not on a statin.    Past Medical History:  Diagnosis Date  . Diabetes mellitus without complication ([place])   . Essential hypertension   . Hypothyroidism     History reviewed. No pertinent surgical history.  Family History  Problem Relation Age of Onset  . Heart disease Mother     Social History:  reports that she has never smoked. She has never used smokeless tobacco. She reports that she does not drink alcohol or use drugs.  Allergies:  Allergies  Allergen Reactions  . Ammonia Shortness Of Breath, Itching and Swelling  . Peroxide [Hydrogen Peroxide] Shortness Of Breath, Itching and Swelling  . Ammonium-Containing Compounds     Prior to Admission medications   Medication Sig Start Date End Date Taking? Authorizing Provider  aspirin EC 81 MG tablet Take 81 mg by mouth daily.   Yes [provider]  Barberry-Oreg Grape-Goldenseal (BERBERINE COMPLEX PO) Take 500 mg by mouth 3 (three) times daily.   Yes [provider]  Cholecalciferol (VITAMIN D3) 10000 units TABS Take 1 tablet by mouth daily.   Yes [provider]  Coenzyme Q10 (CO Q-10 PO) Take 1 capsule by mouth daily.   Yes [provider]  levothyroxine (SYNTHROID, LEVOTHROID) 150 MCG tablet Take 150 mcg by mouth daily before breakfast.  01/20/17  Yes [provider]  Magnesium 500 MG CAPS Take 1 capsule by mouth daily.   Yes [provider]    Medications:  Scheduled: .  stroke: mapping our early stages of recovery book   Does not apply Once  . aspirin  300 mg Rectal Daily   Or  . aspirin  325 mg Oral Daily  . enoxaparin (LOVENOX) injection  40 mg Subcutaneous Q24H  . insulin aspart  0-5 Units Subcutaneous QHS  . insulin aspart  0-9 Units Subcutaneous TID WC  . lisinopril  10 mg Oral Daily    Results for orders placed or performed during the hospital encounter of 01/26/17 (from the past 48 hour(s))  CBG monitoring, ED     Status: Abnormal   Collection Time: 01/26/17  4:57 PM  Result Value Ref Range   Glucose-Capillary 182 (H) 65 - 99 mg/dL  CBC     Status: None   Collection Time: 01/26/17  5:02 PM  Result Value Ref Range   WBC 9.8 4.0 - 10.5 K/uL   RBC 4.51 3.87 - 5.11 MIL/uL   Hemoglobin 12.3 12.0 - 15.0 g/dL   HCT 38.2 36.0 - 46.0 %   MCV 84.7 78.0 - 100.0 fL   MCH 27.3 26.0 - 34.0 pg   MCHC 32.2 30.0 - 36.0 g/dL   RDW 15.4 11.5 - 15.5 %   Platelets 247 150 - 400 K/uL  Differential     Status: Abnormal  Collection Time: 01/26/17  5:02 PM  Result Value Ref Range   Neutrophils Relative % 44 %   Neutro Abs 4.3 1.7 - 7.7 K/uL   Lymphocytes Relative 44 %   Lymphs Abs 4.4 (H) 0.7 - 4.0 K/uL   Monocytes Relative 7 %   Monocytes Absolute 0.7 0.1 - 1.0 K/uL   Eosinophils Relative 5 %   Eosinophils Absolute 0.5 0.0 - 0.7 K/uL   Basophils Relative 0 %   Basophils Absolute 0.0 0.0 - 0.1 K/uL  I-stat troponin, ED     Status: None   Collection Time: 01/26/17  5:14 PM  Result Value Ref Range   Troponin i, poc 0.01 0.00 - 0.08 ng/mL   Comment 3            Comment: Due to the release kinetics of cTnI, a negative result within the first hours of the onset of symptoms does not rule out myocardial infarction with certainty. If myocardial infarction is still suspected, repeat the test at appropriate intervals.   Comprehensive  metabolic panel     Status: Abnormal   Collection Time: 01/26/17  5:44 PM  Result Value Ref Range   Sodium 137 135 - 145 mmol/L   Potassium 4.7 3.5 - 5.1 mmol/L   Chloride 101 101 - 111 mmol/L   CO2 27 22 - 32 mmol/L   Glucose, Bld 174 (H) 65 - 99 mg/dL   BUN 12 6 - 20 mg/dL   Creatinine, Ser 0.67 0.44 - 1.00 mg/dL   Calcium 9.1 8.9 - 10.3 mg/dL   Total Protein 7.9 6.5 - 8.1 g/dL   Albumin 3.7 3.5 - 5.0 g/dL   AST 21 15 - 41 U/L   ALT 14 14 - 54 U/L   Alkaline Phosphatase 53 38 - 126 U/L   Total Bilirubin 0.4 0.3 - 1.2 mg/dL   GFR calc non Af Amer >60 >60 mL/min   GFR calc Af Amer >60 >60 mL/min    Comment: (NOTE) The eGFR has been calculated using the CKD EPI equation. This calculation has not been validated in all clinical situations. eGFR's persistently <60 mL/min signify possible Chronic Kidney Disease.    Anion gap 9 5 - 15  I-Stat Chem 8, ED     Status: Abnormal   Collection Time: 01/26/17  5:47 PM  Result Value Ref Range   Sodium 139 135 - 145 mmol/L   Potassium 4.6 3.5 - 5.1 mmol/L   Chloride 101 101 - 111 mmol/L   BUN 11 6 - 20 mg/dL   Creatinine, Ser 0.60 0.44 - 1.00 mg/dL   Glucose, Bld 170 (H) 65 - 99 mg/dL   Calcium, Ion 1.18 1.15 - 1.40 mmol/L   TCO2 29 0 - 100 mmol/L   Hemoglobin 13.3 12.0 - 15.0 g/dL   HCT 39.0 36.0 - 46.0 %  Protime-INR     Status: None   Collection Time: 01/26/17  6:00 PM  Result Value Ref Range   Prothrombin Time 12.5 11.4 - 15.2 seconds   INR 0.93   APTT     Status: None   Collection Time: 01/26/17  6:00 PM  Result Value Ref Range   aPTT 34 24 - 36 seconds  Rapid urine drug screen (hospital performed)     Status: None   Collection Time: 01/26/17  7:39 PM  Result Value Ref Range   Opiates NONE DETECTED NONE DETECTED   Cocaine NONE DETECTED NONE DETECTED   Benzodiazepines NONE DETECTED NONE DETECTED  Amphetamines NONE DETECTED NONE DETECTED   Tetrahydrocannabinol NONE DETECTED NONE DETECTED   Barbiturates NONE DETECTED NONE  DETECTED    Comment:        DRUG SCREEN FOR MEDICAL PURPOSES ONLY.  IF CONFIRMATION IS NEEDED FOR ANY PURPOSE, NOTIFY LAB WITHIN 5 DAYS.        LOWEST DETECTABLE LIMITS FOR URINE DRUG SCREEN Drug Class       Cutoff (ng/mL) Amphetamine      1000 Barbiturate      200 Benzodiazepine   863 Tricyclics       817 Opiates          300 Cocaine          300 THC              50   CBG monitoring, ED     Status: Abnormal   Collection Time: 01/26/17 10:13 PM  Result Value Ref Range   Glucose-Capillary 220 (H) 65 - 99 mg/dL  MRSA PCR Screening     Status: None   Collection Time: 01/27/17  2:45 AM  Result Value Ref Range   MRSA by PCR NEGATIVE NEGATIVE    Comment:        The GeneXpert MRSA Assay (FDA approved for NASAL specimens only), is one component of a comprehensive MRSA colonization surveillance program. It is not intended to diagnose MRSA infection nor to guide or monitor treatment for MRSA infections.     Ct Head Wo Contrast  Result Date: 01/26/2017 CLINICAL DATA:  Slurred speech beginning at 17 p.m. today. No known injury. EXAM: CT HEAD WITHOUT CONTRAST TECHNIQUE: Contiguous axial images were obtained from the base of the skull through the vertex without intravenous contrast. COMPARISON:  None. FINDINGS: Brain: There is mild cortical atrophy and chronic microvascular ischemic change. No evidence of acute abnormality including hemorrhage, infarct, mass lesion, mass effect, midline shift or abnormal extra-axial fluid collection. No hydrocephalus or pneumocephalus. Vascular: Extensive atherosclerotic vascular disease is seen. Skull: Intact. Sinuses/Orbits: Negative. Other: None. IMPRESSION: No acute abnormality. Mild atrophy and chronic microvascular ischemic change. Atherosclerosis. Electronically Signed   By: Inge Rise M.D.   On: 01/26/2017 17:35    ROS Blood pressure (!) 169/73, pulse 96, temperature 97.6 F (36.4 C), temperature source Oral, resp. rate 16, weight 80.7  kg (178 lb), SpO2 98 %. Neurologic Examination:  Very mild and subtle dysarthria.   Fluent.   Comprehension, naming, repetition- intact. Face symmetric. Tongue midline. Palate raises symmetrically.   Strength 5/5 BUE and BLE.   Coord- FTN, HTS, RAM intact bil. No babinski.  No hoffman's.    Assessment/Plan:  History of isolated mild dysarthria without any other neurological symptoms.  Presumably, she may have had a subcortical small tiny infarct causing this.    Awaiting MRI/MRA Brain.  Go ahead and do TTE and CDUS because we would do it if it was a TIA too.    Consider changing ASA to Plavix as may constitute Aspirin failure.    Her risk factors are poorly controlled and need to be modified.    Rogue Jury, MD 01/27/2017, 4:14 AM

## 2017-01-27 NOTE — Progress Notes (Signed)
[facility] PROGRESS NOTE  Jill Rose ZOX:[id number] DOB: 04/19/1941 DOA: 01/26/2017 PCP: Patient, No Pcp Per  Brief summary   76 y.o. female with a past medical history significant for hypothyroidism and T2DM who presents from PCP office with resolved episode of slurred speech and facial droop. Patient reports she was watching TV yesterday at home when she was trying to answer her husband in her native language and had some difficulty with her speech that she describes as having a "heavy tongue". Patient also reports that her husband noticed some right sided facial droop. Patient report she took 3 baby aspirin and went to bed. She did not have any other symptoms. This morning, patient was still having some mild slurred speech and after talking to a friend decided to go to her PCP. At her PCP, patient had elevated BP with a SBP in the 190. After describing her symptoms was told to go to the ED for a head CT. Patent denies any shortness of breath, chest pain, difficulty with gait, vision changes, weakness in her upper or lower extremities, no urinary or bowel incontinence.  -ED BP is elevated. Up to 248/86.    Assessment/Plan:  Slurred speech. Unclear etiology. Probable TIA vs hypertension related. Ongoing work up to r/o stroke. Pend mra, echo, carotids, lipids, ha1c.  -symptoms have resolved. Neuro exam is non focal, ct head: no acute infarcts.  Cont aspirin, neuro checks.  monitor   HTN Uregncy. Not on meds at home. Improved on lisinopril. Will adjust add second gent as needed.  DM. Unclear history. Reports taking barbery at home. Will check ha1c Hypothyroidism, f/u TSH  Code Status: full Family Communication: d/w patient, her hsuband (indicate person spoken with, relationship, and if by phone, the number) Disposition Plan: home pend stroke work up   Consultants:  neuro  Procedures:  Pend echo  Antibiotics:  none (indicate start date, and stop date if  known)  HPI/Subjective: Alert, oriented. Reports feeling better, no focal neuro symptoms   Objective: Vitals:   01/27/17 0600 01/27/17 0743  BP: (!) 161/65 (!) 168/74  Pulse: 83   Resp: 11   Temp:  97.9 F (36.6 C)   No intake or output data in the 24 hours ending 01/27/17 0812 Filed Weights   01/27/17 0206 01/27/17 0240  Weight: 80.7 kg (178 lb) 80.7 kg (178 lb)    Exam:   General:  No distress   Cardiovascular: s1,s2 rrr  Respiratory: CTA BL  Abdomen: soft, nt, nd   Musculoskeletal: no leg edema    Data Reviewed: Basic Metabolic Panel:  Recent Labs Lab 01/26/17 1744 01/26/17 1747  NA 137 139  K 4.7 4.6  CL 101 101  CO2 27  --   GLUCOSE 174* 170*  BUN 12 11  CREATININE 0.67 0.60  CALCIUM 9.1  --    Liver Function Tests:  Recent Labs Lab 01/26/17 1744  AST 21  ALT 14  ALKPHOS 53  BILITOT 0.4  PROT 7.9  ALBUMIN 3.7   No results for input(s): LIPASE, AMYLASE in the last 168 hours. No results for input(s): AMMONIA in the last 168 hours. CBC:  Recent Labs Lab 01/26/17 1702 01/26/17 1747  WBC 9.8  --   NEUTROABS 4.3  --   HGB 12.3 13.3  HCT 38.2 39.0  MCV 84.7  --   PLT 247  --    Cardiac Enzymes: No results for input(s): CKTOTAL, CKMB, CKMBINDEX, TROPONINI in the last 168 hours. BNP (last 3  results) No results for input(s): BNP in the last 8760 hours.  ProBNP (last 3 results) No results for input(s): PROBNP in the last 8760 hours.  CBG:  Recent Labs Lab 01/26/17 1657 01/26/17 2213  GLUCAP 182* 220*    Recent Results (from the past 240 hour(s))  MRSA PCR Screening     Status: None   Collection Time: 01/27/17  2:45 AM  Result Value Ref Range Status   MRSA by PCR NEGATIVE NEGATIVE Final    Comment:        The GeneXpert MRSA Assay (FDA approved for NASAL specimens only), is one component of a comprehensive MRSA colonization surveillance program. It is not intended to diagnose MRSA infection nor to guide or monitor  treatment for MRSA infections.      Studies: Ct Head Wo Contrast  Result Date: 01/26/2017 CLINICAL DATA:  Slurred speech beginning at 17 p.m. today. No known injury. EXAM: CT HEAD WITHOUT CONTRAST TECHNIQUE: Contiguous axial images were obtained from the base of the skull through the vertex without intravenous contrast. COMPARISON:  None. FINDINGS: Brain: There is mild cortical atrophy and chronic microvascular ischemic change. No evidence of acute abnormality including hemorrhage, infarct, mass lesion, mass effect, midline shift or abnormal extra-axial fluid collection. No hydrocephalus or pneumocephalus. Vascular: Extensive atherosclerotic vascular disease is seen. Skull: Intact. Sinuses/Orbits: Negative. Other: None. IMPRESSION: No acute abnormality. Mild atrophy and chronic microvascular ischemic change. Atherosclerosis. Electronically Signed   By: Drusilla Kanner M.D.   On: 01/26/2017 17:35    Scheduled Meds: .  stroke: mapping our early stages of recovery book   Does not apply Once  . aspirin  300 mg Rectal Daily   Or  . aspirin  325 mg Oral Daily  . enoxaparin (LOVENOX) injection  40 mg Subcutaneous Q24H  . insulin aspart  0-5 Units Subcutaneous QHS  . insulin aspart  0-9 Units Subcutaneous TID WC  . lisinopril  10 mg Oral Daily   Continuous Infusions: . niCARDipine      Principal Problem:   Hypertensive emergency Active Problems:   TIA (transient ischemic attack)   Hypothyroidism   Essential hypertension   Diabetes mellitus without complication (H[facility])    Time spent: >35 minutes     Esperanza Sheets  [facility] Pager [phone]. If 7PM-7AM, please contact night-coverage at [email or web address], password [email or web address] 01/27/2017, 8:12 AM  LOS: 0 days

## 2017-01-27 NOTE — Progress Notes (Addendum)
STROKE TEAM PROGRESS NOTE   HISTORY OF PRESENT ILLNESS (per record) Jill Rose is a 76 y.o. female who developed acute onset of dysarthria Thursday evening. She denied any weakness, numbness, or incoordination at any time.  No headaches.  No vertigo.  She normally takes ASA 162 mg per day, but took 243 mg instead and went to bed. When she woke up in the morning, she still had dysarthria which is more noticeable if she spoke in her native language.  CT Brain is normal except for mild small vessel ischemic disease.  She has a history of HTN and DM, but does not take medication or takes herbal supplements for them.  She was not on a statin.     SUBJECTIVE (INTERVAL HISTORY) Her husband is at the bedside.  Pt still has right facial droop and mild dysarthria but otherwise doing well. Pending CUS and TTE.    OBJECTIVE Temp:  [97.5 F (36.4 C)-98.2 F (36.8 C)] 97.5 F (36.4 C) (07/15 0743) Pulse Rate:  [80-89] 80 (07/15 0743) Cardiac Rhythm: Normal sinus rhythm (07/15 0735) Resp:  [16-22] 16 (07/14 2336) BP: (159-165)/(62-73) 159/73 (07/15 0743) SpO2:  [97 %-99 %] 99 % (07/15 0743) Weight:  [81.2 kg (179 lb 0.2 oz)] 81.2 kg (179 lb 0.2 oz) (07/15 0000)  CBC:   Recent Labs Lab 01/26/17 1702 01/26/17 1747  WBC 9.8  --   NEUTROABS 4.3  --   HGB 12.3 13.3  HCT 38.2 39.0  MCV 84.7  --   PLT 247  --     Basic Metabolic Panel:   Recent Labs Lab 01/26/17 1744 01/26/17 1747  NA 137 139  K 4.7 4.6  CL 101 101  CO2 27  --   GLUCOSE 174* 170*  BUN 12 11  CREATININE 0.67 0.60  CALCIUM 9.1  --     Lipid Panel:     Component Value Date/Time   CHOL 237 (H) 01/27/2017 0748   TRIG 96 01/27/2017 0748   HDL 50 01/27/2017 0748   CHOLHDL 4.7 01/27/2017 0748   VLDL 19 01/27/2017 0748   LDLCALC 168 (H) 01/27/2017 0748   HgbA1c: No results found for: HGBA1C Urine Drug Screen:     Component Value Date/Time   LABOPIA NONE DETECTED 01/26/2017 1939   COCAINSCRNUR NONE DETECTED  01/26/2017 1939   LABBENZ NONE DETECTED 01/26/2017 1939   AMPHETMU NONE DETECTED 01/26/2017 1939   THCU NONE DETECTED 01/26/2017 1939   LABBARB NONE DETECTED 01/26/2017 1939    Alcohol Level No results found for: ETH  IMAGING I have personally reviewed the radiological images below and agree with the radiology interpretations.  Ct Head Wo Contrast 01/26/2017 No acute abnormality. Mild atrophy and chronic microvascular ischemic change. Atherosclerosis.   Mri and Mra Head/brain Wo Cm 01/27/2017 1. Small areas of acute/subacute nonhemorrhagic white matter infarction involving the posterior left centrum semi ovale correlate with patient's symptoms.  2. Mild diffuse periventricular and subcortical T2 changes bilaterally are mildly advanced for age. This likely reflects the sequela of chronic microvascular ischemia.  3. The non dominant left vertebral artery terminates at the PICA origin the aunt is likely occluded more distally given the small stump of a vessel at the vertebrobasilar junction.  4. 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   CUS - Findings are consistent with a 1-39 percent stenosis involving the right internal carotid artery and the left internal carotid artery. The vertebral arteries demonstrate antegrade flow. The vertebral arteries also demonstrate high  resistive waveforms bilaterally of an unknown etiology.   TTE - Left ventricle: The cavity size was normal. There was mild focal   basal hypertrophy of the septum. Systolic function was vigorous.   The estimated ejection fraction was in the range of 65% to 70%.   Wall motion was normal; there were no regional wall motion   abnormalities. Doppler parameters are consistent with abnormal   left ventricular relaxation (grade 1 diastolic dysfunction). - Aortic valve: Trileaflet; mildly thickened, mildly calcified   leaflets. - Mitral valve: Calcified annulus.   PHYSICAL EXAM  Temp:  [97.5 F (36.4 C)-98.2 F (36.8  C)] 97.5 F (36.4 C) (07/15 0743) Pulse Rate:  [80-89] 80 (07/15 0743) Resp:  [16-22] 16 (07/14 2336) BP: (159-165)/(62-73) 159/73 (07/15 0743) SpO2:  [97 %-99 %] 99 % (07/15 0743) Weight:  [179 lb 0.2 oz (81.2 kg)] 179 lb 0.2 oz (81.2 kg) (07/15 0000)  General - Well nourished, well developed, in no apparent distress.  Ophthalmologic - Sharp disc margins OU.   Cardiovascular - Regular rate and rhythm.  Mental Status -  Level of arousal and orientation to time, place, and person were intact. Language including expression, naming, repetition, comprehension was assessed and found intact. Attention span and concentration were normal. Fund of Knowledge was assessed and was intact.  Cranial Nerves II - XII - II - Visual field intact OU. III, IV, VI - Extraocular movements intact. V - Facial sensation intact bilaterally. VII - right lower facial weakness. VIII - Hearing & vestibular intact bilaterally. X - Palate elevates symmetrically, mild dysarthria. XI - Chin turning & shoulder shrug intact bilaterally. XII - Tongue protrusion intact.  Motor Strength - The patient's strength was normal in all extremities and pronator drift was absent.  Bulk was normal and fasciculations were absent.   Motor Tone - Muscle tone was assessed at the neck and appendages and was normal.  Reflexes - The patient's reflexes were 1+ in all extremities and she had no pathological reflexes.  Sensory - Light touch, temperature/pinprick, vibration and proprioception, and Romberg testing were assessed and were symmetrical.    Coordination - The patient had normal movements in the hands and feet with no ataxia or dysmetria.  Tremor was absent.  Gait and Station - The patient's transfers, posture, gait, station, and turns were observed as normal.    ASSESSMENT/PLAN [name] is a 76 y.o. female with history of hypertension, diabetes mellitus, and hypothyroidism presenting with aphasia. She did not  receive IV t-PA due to late presentation.  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  CT head - No acute abnormality.  MRI head - small areas of acute/subacute nonhemorrhagic white matter infarct involving the posterior Lt centrum semi ovale  MRA head - Possible occlusion left vertebral artery.  Carotid Doppler unremarkable  2D Echo - EF 55-70%  LDL - 168  HgbA1c - pending  VTE prophylaxis - Lovenox Diet heart healthy/carb modified Room service appropriate? Yes; Fluid consistency: Thin  aspirin 162 mg daily prior to admission, now on aspirin 325 mg daily. Due to failed ASA therapy, will change to plavix for stroke prevention.   Patient counseled to be compliant with her antithrombotic medications  Ongoing aggressive stroke risk factor management  Therapy recommendations: Home health PT  Disposition:  Likely home today  Hypertension  Stable  Permissive hypertension (OK if < 220/120) but gradually normalize in 5-7 days  Long-term BP goal normotensive  Hyperlipidemia  Home meds: No lipid lowering medications prior to admission  LDL 168, goal < 70  Add Lipitor 40 mg daily  Continue statin at discharge  Diabetes  HgbA1c pending, goal < 7.0  Uncontrolled  Hyperglycemia  SSI  CBG monitoring  Close PCP follow up  Other Stroke Risk Factors  Advanced age  Obesity, Body mass index is 33.82 kg/m., recommend weight loss, diet and exercise as appropriate   Other Active Problems  Hypothyroidism with high TSH - on levothyroxine.   Hospital day # 1  Neurology will sign off. Please call with questions. [facility]. Thanks for the consult.   Marvel PlanJindong Karista Aispuro, MD PhD Stroke Neurology 01/28/2017 11:08 AM    To contact Stroke Continuity provider, please refer to [email or web address]. After hours, contact General Neurology

## 2017-01-28 ENCOUNTER — Inpatient Hospital Stay (HOSPITAL_COMMUNITY): Payer: Medicare HMO

## 2017-01-28 DIAGNOSIS — G459 Transient cerebral ischemic attack, unspecified: Secondary | ICD-10-CM

## 2017-01-28 DIAGNOSIS — I63312 Cerebral infarction due to thrombosis of left middle cerebral artery: Secondary | ICD-10-CM

## 2017-01-28 LAB — ECHOCARDIOGRAM COMPLETE
Height: 61 in
Weight: 2864.22 oz

## 2017-01-28 LAB — GLUCOSE, CAPILLARY
Glucose-Capillary: 203 mg/dL — ABNORMAL HIGH (ref 65–99)
Glucose-Capillary: 256 mg/dL — ABNORMAL HIGH (ref 65–99)

## 2017-01-28 LAB — T4, FREE: Free T4: 0.93 ng/dL (ref 0.61–1.12)

## 2017-01-28 MED ORDER — METFORMIN HCL 500 MG PO TABS: 500.0000 mg | ORAL_TABLET | Freq: Every day | ORAL | 0 refills | Status: DC

## 2017-01-28 MED ORDER — ATORVASTATIN CALCIUM 40 MG PO TABS: 40.0000 mg | ORAL_TABLET | Freq: Every day | ORAL | 0 refills | Status: DC

## 2017-01-28 MED ORDER — LISINOPRIL 20 MG PO TABS: 20.0000 mg | ORAL_TABLET | Freq: Every day | ORAL | 0 refills | Status: DC

## 2017-01-28 MED ORDER — CLOPIDOGREL BISULFATE 75 MG PO TABS: 75.0000 mg | ORAL_TABLET | Freq: Every day | ORAL | 0 refills | Status: DC

## 2017-01-28 MED ORDER — LEVOTHYROXINE SODIUM 75 MCG PO TABS: 150.0000 ug | ORAL_TABLET | Freq: Every day | ORAL | Status: DC

## 2017-01-28 MED ORDER — LEVOTHYROXINE SODIUM 175 MCG PO TABS: 175.0000 ug | ORAL_TABLET | Freq: Every day | ORAL | 0 refills | Status: DC

## 2017-01-28 MED ORDER — LEVOTHYROXINE SODIUM 75 MCG PO TABS: 175.0000 ug | ORAL_TABLET | Freq: Every day | ORAL | Status: DC

## 2017-01-28 MED ORDER — PERFLUTREN LIPID MICROSPHERE
INTRAVENOUS | Status: AC
  Filled 2017-01-28: qty 10

## 2017-01-28 MED ORDER — CLOPIDOGREL BISULFATE 75 MG PO TABS: 75.0000 mg | ORAL_TABLET | Freq: Every day | ORAL | Status: DC

## 2017-01-28 MED ORDER — PERFLUTREN LIPID MICROSPHERE
1.0000 mL | INTRAVENOUS | Status: AC | PRN
  Administered 2017-01-28: 2 mL via INTRAVENOUS
  Filled 2017-01-28: qty 10

## 2017-01-28 MED ORDER — BLOOD GLUCOSE MONITOR KIT: PACK | 0 refills | Status: DC

## 2017-01-28 NOTE — Care Management Note (Signed)
Case Management Note  Patient Details  Name: Jill Rose MRN: [id number] Date of Birth: 01/10/1941  Subjective/Objective:                 [facility] S[facility]. No other CM needs identified.    Action/Plan:   Expected Discharge Date:  01/28/17               Expected Discharge Plan:  Home w Home Health Services  In-House Referral:     Discharge planning Services  CM Consult  Post Acute Care Choice:  Home Health Choice offered to:  Patient, Spouse  DME Arranged:    DME Agency:     HH Arranged:  PT, OT, Speech Therapy HH Agency:  [facility]  Status of Service:  Completed, signed off  If discussed at Long Length of Stay Meetings, dates discussed:    Additional Comments:  Jill SabalDebbie Juliano Mceachin, RN 01/28/2017, 3:39 PM

## 2017-01-28 NOTE — Progress Notes (Signed)
**  Preliminary report by tech**  Carotid artery duplex complete. Findings are consistent with a 1-39 percent stenosis involving the right internal carotid artery and the left internal carotid artery.  The vertebral arteries demonstrate antegrade flow. The vertebral arteries also demonstrate high resistive waveforms bilaterally of an unknown etiology.   01/28/17 1:49 PM Olen CordialGreg Azekiel Cremer RVT

## 2017-01-28 NOTE — Discharge Instructions (Signed)
DASH Eating Plan DASH stands for "Dietary Approaches to Stop Hypertension." The DASH eating plan is a healthy eating plan that has been shown to reduce high blood pressure (hypertension). It may also reduce your risk for type 2 diabetes, heart disease, and stroke. The DASH eating plan may also help with weight loss. What are tips for following this plan? General guidelines  Avoid eating more than 2,300 mg (milligrams) of salt (sodium) a day. If you have hypertension, you may need to reduce your sodium intake to 1,500 mg a day.  Limit alcohol intake to no more than 1 drink a day for nonpregnant women and 2 drinks a day for men. One drink equals 12 oz of beer, 5 oz of wine, or 1 oz of hard liquor.  Work with your health care provider to maintain a healthy body weight or to lose weight. Ask what an ideal weight is for you.  Get at least 30 minutes of exercise that causes your heart to beat faster (aerobic exercise) most days of the week. Activities may include walking, swimming, or biking.  Work with your health care provider or diet and nutrition specialist (dietitian) to adjust your eating plan to your individual calorie needs. Reading food labels  Check food labels for the amount of sodium per serving. Choose foods with less than 5 percent of the Daily Value of sodium. Generally, foods with less than 300 mg of sodium per serving fit into this eating plan.  To find whole grains, look for the word "whole" as the first word in the ingredient list. Shopping  Buy products labeled as "low-sodium" or "no salt added."  Buy fresh foods. Avoid canned foods and premade or frozen meals. Cooking  Avoid adding salt when cooking. Use salt-free seasonings or herbs instead of table salt or sea salt. Check with your health care provider or pharmacist before using salt substitutes.  Do not fry foods. Cook foods using healthy methods such as baking, boiling, grilling, and broiling instead.  Cook with  heart-healthy oils, such as olive, canola, soybean, or sunflower oil. Meal planning   Eat a balanced diet that includes: ? 5 or more servings of fruits and vegetables each day. At each meal, try to fill half of your plate with fruits and vegetables. ? Up to 6-8 servings of whole grains each day. ? Less than 6 oz of lean meat, poultry, or fish each day. A 3-oz serving of meat is about the same size as a deck of cards. One egg equals 1 oz. ? 2 servings of low-fat dairy each day. ? A serving of nuts, seeds, or beans 5 times each week. ? Heart-healthy fats. Healthy fats called Omega-3 fatty acids are found in foods such as flaxseeds and coldwater fish, like sardines, salmon, and mackerel.  Limit how much you eat of the following: ? Canned or prepackaged foods. ? Food that is high in trans fat, such as fried foods. ? Food that is high in saturated fat, such as fatty meat. ? Sweets, desserts, sugary drinks, and other foods with added sugar. ? Full-fat dairy products.  Do not salt foods before eating.  Try to eat at least 2 vegetarian meals each week.  Eat more home-cooked food and less restaurant, buffet, and fast food.  When eating at a restaurant, ask that your food be prepared with less salt or no salt, if possible. What foods are recommended? The items listed may not be a complete list. Talk with your dietitian about what   dietary choices are best for you. Grains Whole-grain or whole-wheat bread. Whole-grain or whole-wheat pasta. Brown rice. Oatmeal. Quinoa. Bulgur. Whole-grain and low-sodium cereals. Pita bread. Low-fat, low-sodium crackers. Whole-wheat flour tortillas. Vegetables Fresh or frozen vegetables (raw, steamed, roasted, or grilled). Low-sodium or reduced-sodium tomato and vegetable juice. Low-sodium or reduced-sodium tomato sauce and tomato paste. Low-sodium or reduced-sodium canned vegetables. Fruits All fresh, dried, or frozen fruit. Canned fruit in natural juice (without  added sugar). Meat and other protein foods Skinless chicken or turkey. Ground chicken or turkey. Pork with fat trimmed off. Fish and seafood. Egg whites. Dried beans, peas, or lentils. Unsalted nuts, nut butters, and seeds. Unsalted canned beans. Lean cuts of beef with fat trimmed off. Low-sodium, lean deli meat. Dairy Low-fat (1%) or fat-free (skim) milk. Fat-free, low-fat, or reduced-fat cheeses. Nonfat, low-sodium ricotta or cottage cheese. Low-fat or nonfat yogurt. Low-fat, low-sodium cheese. Fats and oils Soft margarine without trans fats. Vegetable oil. Low-fat, reduced-fat, or light mayonnaise and salad dressings (reduced-sodium). Canola, safflower, olive, soybean, and sunflower oils. Avocado. Seasoning and other foods Herbs. Spices. Seasoning mixes without salt. Unsalted popcorn and pretzels. Fat-free sweets. What foods are not recommended? The items listed may not be a complete list. Talk with your dietitian about what dietary choices are best for you. Grains Baked goods made with fat, such as croissants, muffins, or some breads. Dry pasta or rice meal packs. Vegetables Creamed or fried vegetables. Vegetables in a cheese sauce. Regular canned vegetables (not low-sodium or reduced-sodium). Regular canned tomato sauce and paste (not low-sodium or reduced-sodium). Regular tomato and vegetable juice (not low-sodium or reduced-sodium). Pickles. Olives. Fruits Canned fruit in a light or heavy syrup. Fried fruit. Fruit in cream or butter sauce. Meat and other protein foods Fatty cuts of meat. Ribs. Fried meat. Bacon. Sausage. Bologna and other processed lunch meats. Salami. Fatback. Hotdogs. Bratwurst. Salted nuts and seeds. Canned beans with added salt. Canned or smoked fish. Whole eggs or egg yolks. Chicken or turkey with skin. Dairy Whole or 2% milk, cream, and half-and-half. Whole or full-fat cream cheese. Whole-fat or sweetened yogurt. Full-fat cheese. Nondairy creamers. Whipped toppings.  Processed cheese and cheese spreads. Fats and oils Butter. Stick margarine. Lard. Shortening. Ghee. Bacon fat. Tropical oils, such as coconut, palm kernel, or palm oil. Seasoning and other foods Salted popcorn and pretzels. Onion salt, garlic salt, seasoned salt, table salt, and sea salt. Worcestershire sauce. Tartar sauce. Barbecue sauce. Teriyaki sauce. Soy sauce, including reduced-sodium. Steak sauce. Canned and packaged gravies. Fish sauce. Oyster sauce. Cocktail sauce. Horseradish that you find on the shelf. Ketchup. Mustard. Meat flavorings and tenderizers. Bouillon cubes. Hot sauce and Tabasco sauce. Premade or packaged marinades. Premade or packaged taco seasonings. Relishes. Regular salad dressings. Where to find more information:  National Heart, Lung, and Blood Institute: www.nhlbi.nih.gov  American Heart Association: www.heart.org Summary  The DASH eating plan is a healthy eating plan that has been shown to reduce high blood pressure (hypertension). It may also reduce your risk for type 2 diabetes, heart disease, and stroke.  With the DASH eating plan, you should limit salt (sodium) intake to 2,300 mg a day. If you have hypertension, you may need to reduce your sodium intake to 1,500 mg a day.  When on the DASH eating plan, aim to eat more fresh fruits and vegetables, whole grains, lean proteins, low-fat dairy, and heart-healthy fats.  Work with your health care provider or diet and nutrition specialist (dietitian) to adjust your eating plan to your individual   calorie needs. This information is not intended to replace advice given to you by your health care provider. Make sure you discuss any questions you have with your health care provider. Document Released: 06/22/2011 Document Revised: 06/26/2016 Document Reviewed: 06/26/2016 Elsevier Interactive Patient Education  2017 [employer]  

## 2017-01-28 NOTE — Progress Notes (Signed)
Patient to transfer to 5M03 report given to receiving nurse, all questions answered at this time.  Pt. VSS with no s/s of distress noted.  Patient stable for transfer.

## 2017-01-28 NOTE — Progress Notes (Signed)
[facility] PROGRESS NOTE  DILIA ALEMANY WUJ:[id number] DOB: 1941/04/01 DOA: 01/26/2017 PCP: Patient, No Pcp Per  Brief summary   76 y.o. female with a past medical history significant for hypothyroidism and T2DM who presents from PCP office with resolved episode of slurred speech and facial droop. Patient reports she was watching TV yesterday at home when she was trying to answer her husband in her native language and had some difficulty with her speech that she describes as having a "heavy tongue". Patient also reports that her husband noticed some right sided facial droop. Patient report she took 3 baby aspirin and went to bed. She did not have any other symptoms. This morning, patient was still having some mild slurred speech and after talking to a friend decided to go to her PCP. At her PCP, patient had elevated BP with a SBP in the 190. After describing her symptoms was told to go to the ED for a head CT. Patent denies any shortness of breath, chest pain, difficulty with gait, vision changes, weakness in her upper or lower extremities, no urinary or bowel incontinence. In the ED, BP was elevated. Up to 248/86.    Assessment/Plan:  CVA -ASA changed to plavix -echo pending -carotids pending -Hgba1c pending -LDL: 168- on statin -appreciate neurology consultation -PT/OT eval  HTN Urgency -Not on meds at home -permissive HTN due to CVA  DM. Unclear history. Reports taking barbery at home - hbga1c pending -refusing insulin  Hypothyroidism -TSH 43 -was on synthroid at home (not sure she was taking)   Code Status: full Family Communication: husband Disposition Plan: home pend stroke work up   Consultants:  neuro    HPI/Subjective: Facial droop only now with slurred speech  Objective: Vitals:   01/28/17 0743 01/28/17 1141  BP: (!) 159/73 (!) 164/67  Pulse: 80 94  Resp:  18  Temp: (!) 97.5 F (36.4 C) 98.7 F (37.1 C)   No intake or output data in the  24 hours ending 01/28/17 1251 Filed Weights   01/27/17 0206 01/27/17 0240 01/28/17 0000  Weight: 80.7 kg (178 lb) 80.7 kg (178 lb) 81.2 kg (179 lb 0.2 oz)    Exam:   General:  In bed, NAD  Cardiovascular: rrr  Respiratory: Clear  Abdomen: +BS ,soft  Musculoskeletal: moves all 4 ext  Data Reviewed: Basic Metabolic Panel:  Recent Labs Lab 01/26/17 1744 01/26/17 1747  NA 137 139  K 4.7 4.6  CL 101 101  CO2 27  --   GLUCOSE 174* 170*  BUN 12 11  CREATININE 0.67 0.60  CALCIUM 9.1  --    Liver Function Tests:  Recent Labs Lab 01/26/17 1744  AST 21  ALT 14  ALKPHOS 53  BILITOT 0.4  PROT 7.9  ALBUMIN 3.7   No results for input(s): LIPASE, AMYLASE in the last 168 hours. No results for input(s): AMMONIA in the last 168 hours. CBC:  Recent Labs Lab 01/26/17 1702 01/26/17 1747  WBC 9.8  --   NEUTROABS 4.3  --   HGB 12.3 13.3  HCT 38.2 39.0  MCV 84.7  --   PLT 247  --    Cardiac Enzymes: No results for input(s): CKTOTAL, CKMB, CKMBINDEX, TROPONINI in the last 168 hours. BNP (last 3 results) No results for input(s): BNP in the last 8760 hours.  ProBNP (last 3 results) No results for input(s): PROBNP in the last 8760 hours.  CBG:  Recent Labs Lab 01/27/17 1150 01/27/17 1637 01/27/17  2122 01/28/17 0754 01/28/17 1120  GLUCAP 219* 184* 196* 203* 256*    Recent Results (from the past 240 hour(s))  MRSA PCR Screening     Status: None   Collection Time: 01/27/17  2:45 AM  Result Value Ref Range Status   MRSA by PCR NEGATIVE NEGATIVE Final    Comment:        The GeneXpert MRSA Assay (FDA approved for NASAL specimens only), is one component of a comprehensive MRSA colonization surveillance program. It is not intended to diagnose MRSA infection nor to guide or monitor treatment for MRSA infections.      Studies: Ct Head Wo Contrast  Result Date: 01/26/2017 CLINICAL DATA:  Slurred speech beginning at 17 p.m. today. No known injury. EXAM:  CT HEAD WITHOUT CONTRAST TECHNIQUE: Contiguous axial images were obtained from the base of the skull through the vertex without intravenous contrast. COMPARISON:  None. FINDINGS: Brain: There is mild cortical atrophy and chronic microvascular ischemic change. No evidence of acute abnormality including hemorrhage, infarct, mass lesion, mass effect, midline shift or abnormal extra-axial fluid collection. No hydrocephalus or pneumocephalus. Vascular: Extensive atherosclerotic vascular disease is seen. Skull: Intact. Sinuses/Orbits: Negative. Other: None. IMPRESSION: No acute abnormality. Mild atrophy and chronic microvascular ischemic change. Atherosclerosis. Electronically Signed   By: Drusilla Kanner M.D.   On: 01/26/2017 17:35   Mr Brain Wo Contrast  Result Date: 01/27/2017 CLINICAL DATA:  Episode of slurred speech beginning at 7 p.m. yesterday. Patient tip 3 baby aspirin and went today. Symptoms had resolved this morning. Right facial droop. Personal history of hypertension and diabetes. EXAM: MRI HEAD WITHOUT CONTRAST MRA HEAD WITHOUT CONTRAST TECHNIQUE: Multiplanar, multiecho pulse sequences of the brain and surrounding structures were obtained without intravenous contrast. Angiographic images of the head were obtained using MRA technique without contrast. COMPARISON:  CT head without contrast 01/26/17 FINDINGS: MRI HEAD FINDINGS Brain: The diffusion-weighted images demonstrate 3 foci of acute nonhemorrhagic white matter infarct involving the posterior left centrum semi ovale. The largest measures 9 mm. T2 changes are associated with the areas of acute infarction. Additional periventricular and scattered subcortical T2 hyperintensities are noted bilaterally. No acute hemorrhage or mass lesion is present. Mild white matter changes extend into the brainstem. A remote lacunar infarct is present in the posterior right cerebellum. Vascular: Flow is present in the major intracranial arteries. Skull and upper  cervical spine: The skullbase is within normal limits. Midline sagittal structures are unremarkable. Craniocervical junction is normal. Sinuses/Orbits: A fluid level is present in the right sphenoid sinus. A single ethmoid air cell is opacified bilaterally. The remaining paranasal sinuses and mastoid air cells are clear. Bilateral lens replacements are present. The globes and orbits are within normal limits. MRA HEAD FINDINGS The internal carotid arteries are within normal limits bilaterally. Prominent bilateral posterior communicating arteries are present. The left A1 and M1 segments are normal. The anterior communicating artery is present. High-grade stenosis is present proximally within the non dominant right A1 segment. The right M1 segment is normal. The MCA bifurcations are intact bilaterally. The ACA and MCA branch vessels are within normal limits. The right vertebral artery is the dominant vessel. The right PICA origin is visualized and normal. The left vertebral artery terminates at the PICA. There is a moderate stenosis of the origin of the left PICA. There is a stump of the distal left vertebral artery at the vertebrobasilar junction suggesting occlusion. Basilar artery is small and somewhat irregular. Fetal type posterior cerebral arteries are patent bilaterally.  There is focal signal loss at the right P2 bifurcation suggesting stenosis. PCA branch vessels are otherwise intact. IMPRESSION: 1. Small areas of acute/subacute nonhemorrhagic white matter infarction involving the posterior left centrum semi ovale correlate with patient's symptoms. 2. Mild diffuse periventricular and subcortical T2 changes bilaterally are mildly advanced for age. This likely reflects the sequela of chronic microvascular ischemia. 3. The non dominant left vertebral artery terminates at the PICA origin the aunt is likely occluded more distally given the small stump of a vessel at the vertebrobasilar junction. 4. Moderate stenosis  at the right P2 bifurcation. Electronically Signed   By: Marin Roberts M.D.   On: 01/27/2017 13:38   Mr Maxine Glenn Head/brain ZO Cm  Result Date: 01/27/2017 CLINICAL DATA:  Episode of slurred speech beginning at 7 p.m. yesterday. Patient tip 3 baby aspirin and went today. Symptoms had resolved this morning. Right facial droop. Personal history of hypertension and diabetes. EXAM: MRI HEAD WITHOUT CONTRAST MRA HEAD WITHOUT CONTRAST TECHNIQUE: Multiplanar, multiecho pulse sequences of the brain and surrounding structures were obtained without intravenous contrast. Angiographic images of the head were obtained using MRA technique without contrast. COMPARISON:  CT head without contrast 01/26/17 FINDINGS: MRI HEAD FINDINGS Brain: The diffusion-weighted images demonstrate 3 foci of acute nonhemorrhagic white matter infarct involving the posterior left centrum semi ovale. The largest measures 9 mm. T2 changes are associated with the areas of acute infarction. Additional periventricular and scattered subcortical T2 hyperintensities are noted bilaterally. No acute hemorrhage or mass lesion is present. Mild white matter changes extend into the brainstem. A remote lacunar infarct is present in the posterior right cerebellum. Vascular: Flow is present in the major intracranial arteries. Skull and upper cervical spine: The skullbase is within normal limits. Midline sagittal structures are unremarkable. Craniocervical junction is normal. Sinuses/Orbits: A fluid level is present in the right sphenoid sinus. A single ethmoid air cell is opacified bilaterally. The remaining paranasal sinuses and mastoid air cells are clear. Bilateral lens replacements are present. The globes and orbits are within normal limits. MRA HEAD FINDINGS The internal carotid arteries are within normal limits bilaterally. Prominent bilateral posterior communicating arteries are present. The left A1 and M1 segments are normal. The anterior communicating  artery is present. High-grade stenosis is present proximally within the non dominant right A1 segment. The right M1 segment is normal. The MCA bifurcations are intact bilaterally. The ACA and MCA branch vessels are within normal limits. The right vertebral artery is the dominant vessel. The right PICA origin is visualized and normal. The left vertebral artery terminates at the PICA. There is a moderate stenosis of the origin of the left PICA. There is a stump of the distal left vertebral artery at the vertebrobasilar junction suggesting occlusion. Basilar artery is small and somewhat irregular. Fetal type posterior cerebral arteries are patent bilaterally. There is focal signal loss at the right P2 bifurcation suggesting stenosis. PCA branch vessels are otherwise intact. IMPRESSION: 1. Small areas of acute/subacute nonhemorrhagic white matter infarction involving the posterior left centrum semi ovale correlate with patient's symptoms. 2. Mild diffuse periventricular and subcortical T2 changes bilaterally are mildly advanced for age. This likely reflects the sequela of chronic microvascular ischemia. 3. The non dominant left vertebral artery terminates at the PICA origin the aunt is likely occluded more distally given the small stump of a vessel at the vertebrobasilar junction. 4. 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 Electronically Signed   By: Marin Roberts M.D.   On: 01/27/2017 13:38  Scheduled Meds: . perflutren lipid microspheres (DEFINITY) IV suspension      .  stroke: mapping our early stages of recovery book   Does not apply Once  . atorvastatin  40 mg Oral q1800  . [START ON 01/29/2017] clopidogrel  75 mg Oral Daily  . enoxaparin (LOVENOX) injection  40 mg Subcutaneous Q24H  . insulin aspart  0-5 Units Subcutaneous QHS  . insulin aspart  0-9 Units Subcutaneous TID WC  . lisinopril  10 mg Oral Daily   Continuous Infusions:   Principal Problem:   Hypertensive emergency Active  Problems:   TIA (transient ischemic attack)   Hypothyroidism   Essential hypertension   Diabetes mellitus without complication (HCC)    Time spent: 25 minutes     Sanita Estrada U Winna Golla  [facility]  If 7PM-7AM, please contact night-coverage at [email or web address], password [email or web address] 01/28/2017, 12:51 PM  LOS: 1 day

## 2017-01-28 NOTE — Evaluation (Signed)
Physical Therapy Evaluation Patient Details Name: Jill Rose MRN: [id number] DOB: 1941/07/08 Today's Date: 01/28/2017   History of Present Illness  Jill Rose is a 76 y.o. female with medical history significant of hypertension, diabetes mellitus, hypothyroidism, who presents with slurred speech. Pt was diagnosed with a acute.subacute nonhemorrhagic white matter infarct via MRI.   Clinical Impression  Pt presents with the above diagnosis and below deficits for therapy evaluation. Prior to admission, pt lived with her husband in a two story home and was completely independent. Pt is able to perform transfers and gait with Min guard to Mod I this session. Pt will benefit from continued acute PT follow-up in order to ensure safety with stair negotiation and longer distance gait prior to discharge.     Follow Up Recommendations Home health PT    Equipment Recommendations  None recommended by PT    Recommendations for Other Services OT consult     Precautions / Restrictions Precautions Precautions: None Restrictions Weight Bearing Restrictions: No      Mobility  Bed Mobility Overal bed mobility: Independent                Transfers Overall transfer level: Modified independent               General transfer comment: pt uses railings to stand. otherwise no assitance needed.   Ambulation/Gait Ambulation/Gait assistance: Min guard Ambulation Distance (Feet): 150 Feet Assistive device: None Gait Pattern/deviations: Step-through pattern;Decreased stride length Gait velocity: decreased Gait velocity interpretation: Below normal speed for age/gender General Gait Details: decreased stride length bilaterally, no LOB. Min guard for safety. Pt was coming out of the bathroom when PT arrived for evaluation.   Stairs            Wheelchair Mobility    Modified Rankin (Stroke Patients Only)       Balance Overall balance assessment: Modified Independent                                           Pertinent Vitals/Pain Pain Assessment: No/denies pain    Home Living Family/patient expects to be discharged to:: Private residence Living Arrangements: Spouse/significant other Available Help at Discharge: Family Type of Home: House Home Access: Stairs to enter     Home Layout: Two level Home Equipment: None      Prior Function Level of Independence: Independent         Comments: was completely independent and driving prior to admission     Hand Dominance   Dominant Hand: Right    Extremity/Trunk Assessment   Upper Extremity Assessment Upper Extremity Assessment: Defer to OT evaluation    Lower Extremity Assessment Lower Extremity Assessment: Overall WFL for tasks assessed    Cervical / Trunk Assessment Cervical / Trunk Assessment: Normal  Communication   Communication: No difficulties  Cognition Arousal/Alertness: Awake/alert Behavior During Therapy: WFL for tasks assessed/performed Overall Cognitive Status: Within Functional Limits for tasks assessed                                        General Comments      Exercises     Assessment/Plan    PT Assessment Patient needs continued PT services  PT Problem List Decreased strength;Decreased activity tolerance;Decreased balance;Decreased mobility  PT Treatment Interventions DME instruction;Gait training;Stair training;Functional mobility training;Balance training;Therapeutic exercise;Therapeutic activities    PT Goals (Current goals can be found in the Care Plan section)  Acute Rehab PT Goals Patient Stated Goal: to get back to doing for herself PT Goal Formulation: With patient Time For Goal Achievement: 02/11/17 Potential to Achieve Goals: Good    Frequency Min 5X/week   Barriers to discharge        Co-evaluation               AM-PAC PT "6 Clicks" Daily Activity  Outcome Measure Difficulty turning over in  bed (including adjusting bedclothes, sheets and blankets)?: None Difficulty moving from lying on back to sitting on the side of the bed? : None Difficulty sitting down on and standing up from a chair with arms (e.g., wheelchair, bedside commode, etc,.)?: A Little Help needed moving to and from a bed to chair (including a wheelchair)?: A Little Help needed walking in hospital room?: A Little Help needed climbing 3-5 steps with a railing? : A Little 6 Click Score: 20    End of Session Equipment Utilized During Treatment: Gait belt Activity Tolerance: Patient tolerated treatment well Patient left: in bed;with call bell/phone within reach;with family/visitor present Nurse Communication: Mobility status PT Visit Diagnosis: Unsteadiness on feet (R26.81);Other abnormalities of gait and mobility (R26.89)    Time: [id number] PT Time Calculation (min) (ACUTE ONLY): 5 min   Charges:   PT Evaluation $PT Eval Moderate Complexity: 1 Procedure     PT G Codes:        Colin BroachSabra M. Devra Stare PT, DPT  [phone]   Roxy MannsSabra Marie Ryann Pauli 01/28/2017, 1:54 PM

## 2017-01-28 NOTE — Progress Notes (Signed)
Patient ready for discharge to home; discharge instructions given and reviewed; Rx's given; patient verbalized her understanding of the instructions; husband present at bedside; patient discharged out via wheelchair; accompanied home by husband.

## 2017-01-28 NOTE — Discharge Summary (Signed)
Physician Discharge Summary  [name] PPJ:[id number] DOB: 11-14-1940 DOA: 01/26/2017  PCP: Jill Panda, MD  Admit date: 01/26/2017 Discharge date: 01/28/2017   Recommendations for Outpatient Follow-Up:   Free t4 pending TSH elevated-- needs follow up-- patient claims compliance  Discharge Diagnosis:   Principal Problem:   Hypertensive emergency Active Problems:   TIA (transient ischemic attack)   Hypothyroidism   Essential hypertension   Diabetes mellitus without complication [facility])   Discharge disposition:  Home.  Discharge Condition: Improved.  Diet recommendation: Low sodium, heart healthy.  Carbohydrate-modified.  Regular.  Wound care: None.   History of Present Illness:   Jill Rose is a 76 y.o. female with medical history significant of hypertension, diabetes mellitus, hypothyroidism, who presents with slurred speech.   Pt states that she had slurred speech that happened at about 19:00 Yesterday. She took 3 pills of 81 mg ASA and went to bed, her symptoms has resolved in the morning when she woke up. Patient denies unilateral weakness, numbness, vision change or hearing loss. She was noted to have right facial droop.  Patient does not have chest pain, shortness breath, cough, fever, chills, nausea, vomiting, diarrhea, abdominal pain, symptoms of UTI. Patient states that she has hx of diabetes, is taking nature medication 'Berberine' which is equivalent to metformin per her doctor in [place]. She has hx of hypertension, but currently not taking medications.    Hospital Course by Problem:   CVA -ASA changed to plavix by neuro -echo below -carotids Findings are consistent with a 1-39 percent stenosis involving the right internal carotid artery and the left internal carotid artery. The vertebral arteries demonstrate antegrade flow. The vertebral arteries also demonstrate high resistive waveforms bilaterally of an unknown etiology. -Hgba1c pending -LDL: 168- on  statin -appreciate neurology consultation -PT/OT eval  HTN Urgency -Not on meds at home -permissive HTN due to CVA  DM. Unclear history. Reports taking barbery at home - hbga1c pending -refusing insulin -added metformin  Hypothyroidism -TSH 43 -was on synthroid 147mg at home-- plan to increase dose and follow up outpatient     Medical Consultants:    Neuro.   Discharge Exam:   Vitals:   01/28/17 0743 01/28/17 1141  BP: (!) 159/73 (!) 164/67  Pulse: 80 94  Resp:  18  Temp: (!) 97.5 F (36.4 C) 98.7 F (37.1 C)   Vitals:   01/27/17 2336 01/28/17 0000 01/28/17 0743 01/28/17 1141  BP: (!) 163/62  (!) 159/73 (!) 164/67  Pulse: 84  80 94  Resp: 16   18  Temp: (!) 97.5 F (36.4 C)  (!) 97.5 F (36.4 C) 98.7 F (37.1 C)  TempSrc: Oral  Oral Oral  SpO2: 97%  99% 100%  Weight:  81.2 kg (179 lb 0.2 oz)    Height:  5' 1"  (1.549 m)         The results of significant diagnostics from this hospitalization (including imaging, microbiology, ancillary and laboratory) are listed below for reference.     Procedures and Diagnostic Studies:   Ct Head Wo Contrast  Result Date: 01/26/2017 CLINICAL DATA:  Slurred speech beginning at 17 p.m. today. No known injury. EXAM: CT HEAD WITHOUT CONTRAST TECHNIQUE: Contiguous axial images were obtained from the base of the skull through the vertex without intravenous contrast. COMPARISON:  None. FINDINGS: Brain: There is mild cortical atrophy and chronic microvascular ischemic change. No evidence of acute abnormality including hemorrhage, infarct, mass lesion, mass effect, midline shift or abnormal extra-axial  fluid collection. No hydrocephalus or pneumocephalus. Vascular: Extensive atherosclerotic vascular disease is seen. Skull: Intact. Sinuses/Orbits: Negative. Other: None. IMPRESSION: No acute abnormality. Mild atrophy and chronic microvascular ischemic change. Atherosclerosis. Electronically Signed   By: Inge Rise M.D.    On: 01/26/2017 17:35   Mr Brain Wo Contrast  Result Date: 01/27/2017 CLINICAL DATA:  Episode of slurred speech beginning at 7 p.m. yesterday. Patient tip 3 baby aspirin and went today. Symptoms had resolved this morning. Right facial droop. Personal history of hypertension and diabetes. EXAM: MRI HEAD WITHOUT CONTRAST MRA HEAD WITHOUT CONTRAST TECHNIQUE: Multiplanar, multiecho pulse sequences of the brain and surrounding structures were obtained without intravenous contrast. Angiographic images of the head were obtained using MRA technique without contrast. COMPARISON:  CT head without contrast 01/26/17 FINDINGS: MRI HEAD FINDINGS Brain: The diffusion-weighted images demonstrate 3 foci of acute nonhemorrhagic white matter infarct involving the posterior left centrum semi ovale. The largest measures 9 mm. T2 changes are associated with the areas of acute infarction. Additional periventricular and scattered subcortical T2 hyperintensities are noted bilaterally. No acute hemorrhage or mass lesion is present. Mild white matter changes extend into the brainstem. A remote lacunar infarct is present in the posterior right cerebellum. Vascular: Flow is present in the major intracranial arteries. Skull and upper cervical spine: The skullbase is within normal limits. Midline sagittal structures are unremarkable. Craniocervical junction is normal. Sinuses/Orbits: A fluid level is present in the right sphenoid sinus. A single ethmoid air cell is opacified bilaterally. The remaining paranasal sinuses and mastoid air cells are clear. Bilateral lens replacements are present. The globes and orbits are within normal limits. MRA HEAD FINDINGS The internal carotid arteries are within normal limits bilaterally. Prominent bilateral posterior communicating arteries are present. The left A1 and M1 segments are normal. The anterior communicating artery is present. High-grade stenosis is present proximally within the non dominant right  A1 segment. The right M1 segment is normal. The MCA bifurcations are intact bilaterally. The ACA and MCA branch vessels are within normal limits. The right vertebral artery is the dominant vessel. The right PICA origin is visualized and normal. The left vertebral artery terminates at the PICA. There is a moderate stenosis of the origin of the left PICA. There is a stump of the distal left vertebral artery at the vertebrobasilar junction suggesting occlusion. Basilar artery is small and somewhat irregular. Fetal type posterior cerebral arteries are patent bilaterally. There is focal signal loss at the right P2 bifurcation suggesting stenosis. PCA branch vessels are otherwise intact. IMPRESSION: 1. Small areas of acute/subacute nonhemorrhagic white matter infarction involving the posterior left centrum semi ovale correlate with patient's symptoms. 2. Mild diffuse periventricular and subcortical T2 changes bilaterally are mildly advanced for age. This likely reflects the sequela of chronic microvascular ischemia. 3. The non dominant left vertebral artery terminates at the PICA origin the aunt is likely occluded more distally given the small stump of a vessel at the vertebrobasilar junction. 4. 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 Electronically Signed   By: San Morelle M.D.   On: 01/27/2017 13:38   Mr Jodene Nam Head/brain NF Cm  Result Date: 01/27/2017 CLINICAL DATA:  Episode of slurred speech beginning at 7 p.m. yesterday. Patient tip 3 baby aspirin and went today. Symptoms had resolved this morning. Right facial droop. Personal history of hypertension and diabetes. EXAM: MRI HEAD WITHOUT CONTRAST MRA HEAD WITHOUT CONTRAST TECHNIQUE: Multiplanar, multiecho pulse sequences of the brain and surrounding structures were obtained without intravenous contrast.  Angiographic images of the head were obtained using MRA technique without contrast. COMPARISON:  CT head without contrast 01/26/17 FINDINGS: MRI HEAD  FINDINGS Brain: The diffusion-weighted images demonstrate 3 foci of acute nonhemorrhagic white matter infarct involving the posterior left centrum semi ovale. The largest measures 9 mm. T2 changes are associated with the areas of acute infarction. Additional periventricular and scattered subcortical T2 hyperintensities are noted bilaterally. No acute hemorrhage or mass lesion is present. Mild white matter changes extend into the brainstem. A remote lacunar infarct is present in the posterior right cerebellum. Vascular: Flow is present in the major intracranial arteries. Skull and upper cervical spine: The skullbase is within normal limits. Midline sagittal structures are unremarkable. Craniocervical junction is normal. Sinuses/Orbits: A fluid level is present in the right sphenoid sinus. A single ethmoid air cell is opacified bilaterally. The remaining paranasal sinuses and mastoid air cells are clear. Bilateral lens replacements are present. The globes and orbits are within normal limits. MRA HEAD FINDINGS The internal carotid arteries are within normal limits bilaterally. Prominent bilateral posterior communicating arteries are present. The left A1 and M1 segments are normal. The anterior communicating artery is present. High-grade stenosis is present proximally within the non dominant right A1 segment. The right M1 segment is normal. The MCA bifurcations are intact bilaterally. The ACA and MCA branch vessels are within normal limits. The right vertebral artery is the dominant vessel. The right PICA origin is visualized and normal. The left vertebral artery terminates at the PICA. There is a moderate stenosis of the origin of the left PICA. There is a stump of the distal left vertebral artery at the vertebrobasilar junction suggesting occlusion. Basilar artery is small and somewhat irregular. Fetal type posterior cerebral arteries are patent bilaterally. There is focal signal loss at the right P2 bifurcation  suggesting stenosis. PCA branch vessels are otherwise intact. IMPRESSION: 1. Small areas of acute/subacute nonhemorrhagic white matter infarction involving the posterior left centrum semi ovale correlate with patient's symptoms. 2. Mild diffuse periventricular and subcortical T2 changes bilaterally are mildly advanced for age. This likely reflects the sequela of chronic microvascular ischemia. 3. The non dominant left vertebral artery terminates at the PICA origin the aunt is likely occluded more distally given the small stump of a vessel at the vertebrobasilar junction. 4. 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 Electronically Signed   By: San Morelle M.D.   On: 01/27/2017 13:38     Labs:   Basic Metabolic Panel:  Recent Labs Lab 01/26/17 1744 01/26/17 1747  NA 137 139  K 4.7 4.6  CL 101 101  CO2 27  --   GLUCOSE 174* 170*  BUN 12 11  CREATININE 0.67 0.60  CALCIUM 9.1  --    GFR Estimated Creatinine Clearance: 59.6 mL/min (by C-G formula based on SCr of 0.6 mg/dL). Liver Function Tests:  Recent Labs Lab 01/26/17 1744  AST 21  ALT 14  ALKPHOS 53  BILITOT 0.4  PROT 7.9  ALBUMIN 3.7   No results for input(s): LIPASE, AMYLASE in the last 168 hours. No results for input(s): AMMONIA in the last 168 hours. Coagulation profile  Recent Labs Lab 01/26/17 1800  INR 0.93    CBC:  Recent Labs Lab 01/26/17 1702 01/26/17 1747  WBC 9.8  --   NEUTROABS 4.3  --   HGB 12.3 13.3  HCT 38.2 39.0  MCV 84.7  --   PLT 247  --    Cardiac Enzymes: No results for input(s):  CKTOTAL, CKMB, CKMBINDEX, TROPONINI in the last 168 hours. BNP: Invalid input(s): POCBNP CBG:  Recent Labs Lab 01/27/17 1150 01/27/17 1637 01/27/17 2122 01/28/17 0754 01/28/17 1120  GLUCAP 219* 184* 196* 203* 256*   D-Dimer No results for input(s): DDIMER in the last 72 hours. Hgb A1c No results for input(s): HGBA1C in the last 72 hours. Lipid Profile  Recent Labs  01/27/17 0748   CHOL 237*  HDL 50  LDLCALC 168*  TRIG 96  CHOLHDL 4.7   Thyroid function studies  Recent Labs  01/27/17 0748  TSH 43.122*   Anemia work up No results for input(s): VITAMINB12, FOLATE, FERRITIN, TIBC, IRON, RETICCTPCT in the last 72 hours. Microbiology Recent Results (from the past 240 hour(s))  MRSA PCR Screening     Status: None   Collection Time: 01/27/17  2:45 AM  Result Value Ref Range Status   MRSA by PCR NEGATIVE NEGATIVE Final    Comment:        The GeneXpert MRSA Assay (FDA approved for NASAL specimens only), is one component of a comprehensive MRSA colonization surveillance program. It is not intended to diagnose MRSA infection nor to guide or monitor treatment for MRSA infections.      Discharge Instructions:   Discharge Instructions    Ambulatory referral to Neurology    Complete by:  As directed    An appointment is requested in approximately: 6 weeks   Diet - low sodium heart healthy    Complete by:  As directed    Diet Carb Modified    Complete by:  As directed    Discharge instructions    Complete by:  As directed    Home health PT/OT/SLP Glucometer for blood sugar See PCP 1 week for   Increase activity slowly    Complete by:  As directed      Allergies as of 01/28/2017      Reactions   Ammonia Shortness Of Breath, Itching, Swelling   Peroxide [hydrogen Peroxide] Shortness Of Breath, Itching, Swelling   Ammonium-containing Compounds       Medication List    STOP taking these medications   aspirin EC 81 MG tablet   BERBERINE COMPLEX PO     TAKE these medications   atorvastatin 40 MG tablet Commonly known as:  LIPITOR Take 1 tablet (40 mg total) by mouth daily at 6 PM.   blood glucose meter kit and supplies Kit Dispense based on patient and insurance preference. Use up to four times daily as directed. (FOR ICD-9 250.00, 250.01).   clopidogrel 75 MG tablet Commonly known as:  PLAVIX Take 1 tablet (75 mg total) by mouth  daily.   CO Q-10 PO Take 1 capsule by mouth daily.   levothyroxine 175 MCG tablet Commonly known as:  SYNTHROID, LEVOTHROID Take 1 tablet (175 mcg total) by mouth daily before breakfast. What changed:  medication strength  how much to take   lisinopril 20 MG tablet Commonly known as:  PRINIVIL,ZESTRIL Take 1 tablet (20 mg total) by mouth daily.   Magnesium 500 MG Caps Take 1 capsule by mouth daily.   metFORMIN 500 MG tablet Commonly known as:  GLUCOPHAGE Take 1 tablet (500 mg total) by mouth daily with breakfast.   Vitamin D3 10000 units Tabs Take 1 tablet by mouth daily.      Follow-up Information    Dennie Bible, NP. Schedule an appointment as soon as possible for a visit in 6 week(s).   Specialty:  Family  Medicine Contact information: [address] [place] [address] [phone]            Time coordinating discharge: 35 min  Signed:  Aaron Bostwick Alison Stalling   [facility] 01/28/2017, 3:03 PM

## 2017-01-28 NOTE — Progress Notes (Signed)
  Echocardiogram 2D Echocardiogram has been performed.  Channel Papandrea 01/28/2017, 9:50 AM

## 2017-01-29 LAB — VAS US CAROTID
LEFT ECA DIAS: 0 cm/s
LEFT VERTEBRAL DIAS: -13 cm/s
Left CCA dist dias: -10 cm/s
Left CCA dist sys: -59 cm/s
Left CCA prox dias: 9 cm/s
Left CCA prox sys: 81 cm/s
Left ICA dist dias: -22 cm/s
Left ICA dist sys: -82 cm/s
Left ICA prox dias: -8 cm/s
Left ICA prox sys: -47 cm/s
RIGHT ECA DIAS: 0 cm/s
RIGHT VERTEBRAL DIAS: -10 cm/s
Right CCA prox dias: -7 cm/s
Right CCA prox sys: -47 cm/s
Right cca dist sys: -66 cm/s

## 2017-01-30 LAB — HEMOGLOBIN A1C
Hgb A1c MFr Bld: 8.5 % — ABNORMAL HIGH (ref 4.8–5.6)
Mean Plasma Glucose: 197 mg/dL

## 2017-02-13 NOTE — Progress Notes (Signed)
Cardiology Office Note   Date:  02/16/2017   ID:  Jill Rose, DOB 09/22/1940, MRN [id number]  PCP:  Ralene OkMoreira, Roy, MD  Cardiologist:   Jill HawsPeter Nishan, MD   No chief complaint on file.     History of Present Illness: Jill Rose is a 76 y.o. female who presents for consultation regarding vascular disease and recent CVA. Patient has elevated lipids, poorly controlled DM, HTN and hypothyroidism  [facility]21/17 with slurred speech.  MRI showed small area of infarct involving posterior left centrum semi ovale Carotids plaque no stenosis . Echo 01/28/17 also reviewed EF 65-70% no valve disease or SOE. Telemetry showed no arrhythmia  TSH noted to be elevated at 43 despite claiming compliance   She still is self conscious about speech a bit but is doing well Thyroid dose has been increased twice. She has chronic LLE venous disease with stasis and complains of LE  Pain that may be neuropathic She needs screening for CAD given stroke and poorly controlled DM and elevated lipids.   Past Medical History:  Diagnosis Date  . Diabetes mellitus without complication (HCC)   . Essential hypertension   . Hypothyroidism     History reviewed. No pertinent surgical history.   Current Outpatient Prescriptions  Medication Sig Dispense Refill  . atorvastatin (LIPITOR) 40 MG tablet Take 1 tablet (40 mg total) by mouth daily at 6 PM. 30 tablet 0  . Cholecalciferol (VITAMIN D3) 10000 units TABS Take 1 tablet by mouth daily.    . clopidogrel (PLAVIX) 75 MG tablet Take 1 tablet (75 mg total) by mouth daily. 30 tablet 0  . Coenzyme Q10 (CO Q-10 PO) Take 1 capsule by mouth daily.    Marland Kitchen. levothyroxine (SYNTHROID, LEVOTHROID) 175 MCG tablet Take 1 tablet (175 mcg total) by mouth daily before breakfast. 30 tablet 0  . lisinopril (PRINIVIL,ZESTRIL) 20 MG tablet Take 1 tablet (20 mg total) by mouth daily. 30 tablet 0  . Magnesium 500 MG CAPS Take 1 capsule by mouth daily.    . metFORMIN (GLUCOPHAGE)  500 MG tablet Take 1 tablet (500 mg total) by mouth daily with breakfast. 30 tablet 0  . metoprolol tartrate (LOPRESSOR) 25 MG tablet Take 1 tablet (25 mg total) by mouth 2 (two) times daily. 180 tablet 3   No current facility-administered medications for this visit.     Allergies:   Ammonia; Peroxide [hydrogen peroxide]; and Ammonium-containing compounds    Social History:  The patient  reports that she has never smoked. She has never used smokeless tobacco. She reports that she does not drink alcohol or use drugs.   Family History:  The patient's family history includes Heart disease in her mother.    ROS:  Please see the history of present illness.   Otherwise, review of systems are positive for none.   All other systems are reviewed and negative.    PHYSICAL EXAM: VS:  BP (!) 142/80   Pulse (!) 109   Ht 5\' 1"  (1.549 m)   Wt 174 lb 6.4 oz (79.1 kg)   SpO2 99%   BMI 32.95 kg/m  , BMI Body mass index is 32.95 kg/m. Affect appropriate Overweight [place] female  HEENT: normal Neck supple with no adenopathy JVP normal no bruits no thyromegaly Lungs clear with no wheezing and good diaphragmatic motion Heart:  S1/S2 SEM murmur, no rub, gallop or click PMI normal Abdomen: benighn, BS positve, no tenderness, no AAA no bruit.  No  HSM or HJR Distal pulses intact with no bruits Plus one bilateral  Edema with vitiligo and stasis LLE Neuro non-focal Skin warm and dry No muscular weakness    EKG:  01/26/17 SR nonspecific ST changes low precordial voltage    Recent Labs: 01/26/2017: ALT 14; BUN 11; Creatinine, Ser 0.60; Hemoglobin 13.3; Platelets 247; Potassium 4.6; Sodium 139 01/27/2017: TSH 43.122    Lipid Panel    Component Value Date/Time   CHOL 237 (H) 01/27/2017 0748   TRIG 96 01/27/2017 0748   HDL 50 01/27/2017 0748   CHOLHDL 4.7 01/27/2017 0748   VLDL 19 01/27/2017 0748   LDLCALC 168 (H) 01/27/2017 0748      Wt Readings from Last 3 Encounters:  02/16/17 174 lb  6.4 oz (79.1 kg)  01/28/17 179 lb 0.2 oz (81.2 kg)      Other studies Reviewed: Additional studies/ records that were reviewed today include: Epic notes recent admission echo, MRI, carotid duplex And ECG as well as lab work .    ASSESSMENT AND PLAN:  1.  CVA: continue plavix does not appear embolic speech Rx 2. HTN borderline control with tachycardia add lopressor 25 bid and f/u next available  3. Cholesterol on statin now labs with primary  4. Hypothyroidism escalating doses of synthroid f/u primary for TSH 5. DM suspect she will need more than once daily glucophage A1c 8.5 Correlation with stroke and vascular disease discussed  6. Venous Stasis :  Compression stockings she indicates having seen a vein specialist before May need diuretic in future 7. Leg pain: check ABI('s and LE arterial duplex more likely neuropathic 8. Vascular:  Poorly controlled DM , elevated lipids and CVA f/u lexiscan myovue to r/o CAD   Current medicines are reviewed at length with the patient today.  The patient does not have concerns regarding medicines.  The following changes have been made:  Lopressor 25 bid   Labs/ tests ordered today include: Lexiscan myovue and ABI with LE arterieal duplex  Orders Placed This Encounter  Procedures  . Myocardial Perfusion Imaging     Disposition:   FU with me next available     Signed, Jill HawsPeter Nishan, MD  02/16/2017 11:30 AM    [facility] [address], [place], [place]  [address] Phone: [phone]) [phone]; Fax: [phone]) [phone]

## 2017-02-16 ENCOUNTER — Ambulatory Visit (INDEPENDENT_AMBULATORY_CARE_PROVIDER_SITE_OTHER): Payer: Medicare HMO | Admitting: Cardiovascular Disease

## 2017-02-16 ENCOUNTER — Encounter: Payer: Self-pay | Admitting: Cardiovascular Disease

## 2017-02-16 ENCOUNTER — Telehealth (HOSPITAL_COMMUNITY): Payer: Self-pay | Admitting: *Deleted

## 2017-02-16 VITALS — BP 142/80 | HR 109 | Ht 61.0 in | Wt 174.4 lb

## 2017-02-16 DIAGNOSIS — I739 Peripheral vascular disease, unspecified: Secondary | ICD-10-CM

## 2017-02-16 DIAGNOSIS — R9431 Abnormal electrocardiogram [ECG] [EKG]: Secondary | ICD-10-CM | POA: Diagnosis not present

## 2017-02-16 DIAGNOSIS — R0600 Dyspnea, unspecified: Secondary | ICD-10-CM

## 2017-02-16 MED ORDER — METOPROLOL TARTRATE 25 MG PO TABS: 25.0000 mg | ORAL_TABLET | Freq: Two times a day (BID) | ORAL | 3 refills | Status: DC

## 2017-02-16 NOTE — Telephone Encounter (Signed)
Patient given detailed instructions per Myocardial Perfusion Study Information Sheet for the test on 8.9/18 at 10:00. Patient notified to arrive 15 minutes early and that it is imperative to arrive on time for appointment to keep from having the test rescheduled. ° If you need to cancel or reschedule your appointment, please call the office within 24 hours of your appointment. . Patient verbalized understanding.Sharon S Brooks ° ° ° °

## 2017-02-16 NOTE — Patient Instructions (Signed)
Medication Instructions:  Your physician has recommended you make the following change in your medication:  1-START Metoprolol 25 mg by mouth twice daily  Labwork: NONE  Testing/Procedures: Your physician has requested that you have a lexiscan myoview. For further information please visit [email or web address]. Please follow instruction sheet, as given.  Your physician has requested that you have a lower extremity arterial duplex. During this test an ultrasound is used to evaluate arterial blood flow in the legs. Allow one hour for this exam. There are no restrictions or special instructions.  Follow-Up: Your physician wants you to follow-up next available with Dr. Eden EmmsNishan.  If you need a refill on your cardiac medications before your next appointment, please call your pharmacy.

## 2017-02-19 ENCOUNTER — Other Ambulatory Visit: Payer: Self-pay | Admitting: Cardiovascular Disease

## 2017-02-19 DIAGNOSIS — R0600 Dyspnea, unspecified: Secondary | ICD-10-CM

## 2017-02-19 DIAGNOSIS — I739 Peripheral vascular disease, unspecified: Secondary | ICD-10-CM

## 2017-02-19 DIAGNOSIS — R9431 Abnormal electrocardiogram [ECG] [EKG]: Secondary | ICD-10-CM

## 2017-02-22 ENCOUNTER — Encounter (HOSPITAL_COMMUNITY): Payer: Medicare HMO

## 2017-03-02 ENCOUNTER — Inpatient Hospital Stay (HOSPITAL_COMMUNITY): Admission: RE | Admit: 2017-03-02 | Payer: Medicare HMO | Source: Ambulatory Visit

## 2017-03-08 ENCOUNTER — Telehealth (HOSPITAL_COMMUNITY): Payer: Self-pay | Admitting: Cardiovascular Disease

## 2017-03-20 NOTE — Telephone Encounter (Signed)
User: Trina AoGRIFFIN, Tishina Lown A Date/time: 03/09/17 1:40 PM  Comment: Called pt and lmsg for her to CB to r/s myoview.   Context:  Outcome: Left Message  Phone number: [phone] Phone Type: Home Phone  Comm. type: Telephone Call type: Outgoing  Contact: Lottie Raterhomas, Makaelyn L Relation to patient: Self    User: Trina AoGRIFFIN, Chinenye Katzenberger A Date/time: 03/08/17 9:53 AM  Comment: Called pt and lmsg for her to Cb r/s myoview in 3 months. Pt was also contacted at 3:30 on 03/07/17 and a message was left as well.   Context:  Outcome: Left Message  Phone number: [phone] Phone Type: Home Phone  Comm. type: Telephone Call type: Outgoing  Contact: Lottie Raterhomas, Janea L Relation to patient: Self

## 2017-05-15 ENCOUNTER — Ambulatory Visit: Payer: Medicare HMO | Admitting: Cardiovascular Disease

## 2017-05-30 ENCOUNTER — Encounter: Payer: Self-pay | Admitting: Cardiovascular Disease

## 2019-12-29 ENCOUNTER — Encounter: Payer: Medicare HMO | Admitting: Family Medicine

## 2020-01-05 ENCOUNTER — Other Ambulatory Visit: Payer: Self-pay

## 2020-01-05 ENCOUNTER — Emergency Department (HOSPITAL_COMMUNITY): Payer: Medicare HMO

## 2020-01-05 ENCOUNTER — Observation Stay (HOSPITAL_COMMUNITY): Payer: Medicare HMO

## 2020-01-05 ENCOUNTER — Inpatient Hospital Stay (HOSPITAL_COMMUNITY)
Admission: EM | Admit: 2020-01-05 | Discharge: 2020-01-14 | DRG: 065 | Disposition: A | Discharge status: Rehabilitation Facility | Payer: Medicare HMO | Attending: Internal Medicine | Admitting: Internal Medicine

## 2020-01-05 ENCOUNTER — Encounter (HOSPITAL_COMMUNITY): Payer: Self-pay

## 2020-01-05 DIAGNOSIS — E669 Obesity, unspecified: Secondary | ICD-10-CM | POA: Diagnosis present

## 2020-01-05 DIAGNOSIS — Z6830 Body mass index (BMI) 30.0-30.9, adult: Secondary | ICD-10-CM

## 2020-01-05 DIAGNOSIS — E039 Hypothyroidism, unspecified: Secondary | ICD-10-CM | POA: Diagnosis not present

## 2020-01-05 DIAGNOSIS — G8194 Hemiplegia, unspecified affecting left nondominant side: Secondary | ICD-10-CM

## 2020-01-05 DIAGNOSIS — Z883 Allergy status to other anti-infective agents status: Secondary | ICD-10-CM

## 2020-01-05 DIAGNOSIS — E871 Hypo-osmolality and hyponatremia: Secondary | ICD-10-CM | POA: Diagnosis present

## 2020-01-05 DIAGNOSIS — R531 Weakness: Secondary | ICD-10-CM

## 2020-01-05 DIAGNOSIS — E6609 Other obesity due to excess calories: Secondary | ICD-10-CM

## 2020-01-05 DIAGNOSIS — I110 Hypertensive heart disease with heart failure: Secondary | ICD-10-CM | POA: Diagnosis present

## 2020-01-05 DIAGNOSIS — I5032 Chronic diastolic (congestive) heart failure: Secondary | ICD-10-CM | POA: Diagnosis present

## 2020-01-05 DIAGNOSIS — I63511 Cerebral infarction due to unspecified occlusion or stenosis of right middle cerebral artery: Secondary | ICD-10-CM | POA: Diagnosis not present

## 2020-01-05 DIAGNOSIS — I358 Other nonrheumatic aortic valve disorders: Secondary | ICD-10-CM | POA: Diagnosis present

## 2020-01-05 DIAGNOSIS — R29810 Facial weakness: Secondary | ICD-10-CM | POA: Diagnosis present

## 2020-01-05 DIAGNOSIS — E785 Hyperlipidemia, unspecified: Secondary | ICD-10-CM | POA: Diagnosis present

## 2020-01-05 DIAGNOSIS — Z8249 Family history of ischemic heart disease and other diseases of the circulatory system: Secondary | ICD-10-CM

## 2020-01-05 DIAGNOSIS — E1165 Type 2 diabetes mellitus with hyperglycemia: Secondary | ICD-10-CM | POA: Diagnosis present

## 2020-01-05 DIAGNOSIS — Z8673 Personal history of transient ischemic attack (TIA), and cerebral infarction without residual deficits: Secondary | ICD-10-CM

## 2020-01-05 DIAGNOSIS — E1142 Type 2 diabetes mellitus with diabetic polyneuropathy: Secondary | ICD-10-CM | POA: Diagnosis present

## 2020-01-05 DIAGNOSIS — Z79899 Other long term (current) drug therapy: Secondary | ICD-10-CM

## 2020-01-05 DIAGNOSIS — I10 Essential (primary) hypertension: Secondary | ICD-10-CM | POA: Diagnosis present

## 2020-01-05 DIAGNOSIS — Z91048 Other nonmedicinal substance allergy status: Secondary | ICD-10-CM

## 2020-01-05 DIAGNOSIS — Z7982 Long term (current) use of aspirin: Secondary | ICD-10-CM

## 2020-01-05 DIAGNOSIS — Z79890 Hormone replacement therapy (postmenopausal): Secondary | ICD-10-CM

## 2020-01-05 DIAGNOSIS — M1990 Unspecified osteoarthritis, unspecified site: Secondary | ICD-10-CM | POA: Diagnosis present

## 2020-01-05 DIAGNOSIS — I639 Cerebral infarction, unspecified: Secondary | ICD-10-CM | POA: Diagnosis not present

## 2020-01-05 DIAGNOSIS — D72829 Elevated white blood cell count, unspecified: Secondary | ICD-10-CM

## 2020-01-05 DIAGNOSIS — Z20822 Contact with and (suspected) exposure to covid-19: Secondary | ICD-10-CM | POA: Diagnosis present

## 2020-01-05 LAB — COMPREHENSIVE METABOLIC PANEL
ALT: 12 U/L (ref 0–44)
AST: 18 U/L (ref 15–41)
Albumin: 3.5 g/dL (ref 3.5–5.0)
Alkaline Phosphatase: 57 U/L (ref 38–126)
Anion gap: 12 (ref 5–15)
BUN: 15 mg/dL (ref 8–23)
CO2: 22 mmol/L (ref 22–32)
Calcium: 9.3 mg/dL (ref 8.9–10.3)
Chloride: 98 mmol/L (ref 98–111)
Creatinine, Ser: 0.78 mg/dL (ref 0.44–1.00)
GFR calc Af Amer: >60 mL/min (ref >60)
GFR calc non Af Amer: >60 mL/min (ref >60)
Glucose, Bld: 135 mg/dL — ABNORMAL HIGH (ref 70–99)
Potassium: 5.3 mmol/L — ABNORMAL HIGH (ref 3.5–5.1)
Sodium: 132 mmol/L — ABNORMAL LOW (ref 135–145)
Total Bilirubin: 0.3 mg/dL (ref 0.3–1.2)
Total Protein: 8 g/dL (ref 6.5–8.1)

## 2020-01-05 LAB — CBC
HCT: 41.7 % (ref 36.0–46.0)
Hemoglobin: 13 g/dL (ref 12.0–15.0)
MCH: 27.3 pg (ref 26.0–34.0)
MCHC: 31.2 g/dL (ref 30.0–36.0)
MCV: 87.4 fL (ref 80.0–100.0)
Platelets: 258 10*3/uL (ref 150–400)
RBC: 4.77 MIL/uL (ref 3.87–5.11)
RDW: 15.2 % (ref 11.5–15.5)
WBC: 11.1 10*3/uL — ABNORMAL HIGH (ref 4.0–10.5)
nRBC: 0 % (ref 0.0–0.2)

## 2020-01-05 LAB — DIFFERENTIAL
Abs Immature Granulocytes: 0.05 10*3/uL (ref 0.00–0.07)
Basophils Absolute: 0.1 10*3/uL (ref 0.0–0.1)
Basophils Relative: 1 %
Eosinophils Absolute: 0.3 10*3/uL (ref 0.0–0.5)
Eosinophils Relative: 3 %
Immature Granulocytes: 1 %
Lymphocytes Relative: 32 %
Lymphs Abs: 3.5 10*3/uL (ref 0.7–4.0)
Monocytes Absolute: 0.7 10*3/uL (ref 0.1–1.0)
Monocytes Relative: 7 %
Neutro Abs: 6.5 10*3/uL (ref 1.7–7.7)
Neutrophils Relative %: 56 %

## 2020-01-05 LAB — SARS CORONAVIRUS 2 BY RT PCR (HOSPITAL ORDER, PERFORMED IN ~~LOC~~ HOSPITAL LAB): SARS Coronavirus 2: NEGATIVE

## 2020-01-05 LAB — APTT: aPTT: 34 seconds (ref 24–36)

## 2020-01-05 LAB — PROTIME-INR
INR: 1 (ref 0.8–1.2)
Prothrombin Time: 12.6 seconds (ref 11.4–15.2)

## 2020-01-05 LAB — CBG MONITORING, ED: Glucose-Capillary: 151 mg/dL — ABNORMAL HIGH (ref 70–99)

## 2020-01-05 MED ORDER — LOSARTAN POTASSIUM 50 MG PO TABS
50.0000 mg | ORAL_TABLET | Freq: Every day | ORAL | Status: DC
  Administered 2020-01-05 – 2020-01-13 (×9): 50 mg via ORAL
  Filled 2020-01-05 (×9): qty 1

## 2020-01-05 MED ORDER — ASPIRIN 300 MG RE SUPP: 300.0000 mg | Freq: Every day | RECTAL | Status: DC

## 2020-01-05 MED ORDER — SODIUM CHLORIDE 0.9% FLUSH: 3.0000 mL | Freq: Once | INTRAVENOUS | Status: DC

## 2020-01-05 MED ORDER — ACETAMINOPHEN 160 MG/5ML PO SOLN: 650.0000 mg | ORAL | Status: DC | PRN

## 2020-01-05 MED ORDER — ACETAMINOPHEN 650 MG RE SUPP: 650.0000 mg | RECTAL | Status: DC | PRN

## 2020-01-05 MED ORDER — ACETAMINOPHEN 325 MG PO TABS
650.0000 mg | ORAL_TABLET | ORAL | Status: DC | PRN
  Administered 2020-01-11 – 2020-01-13 (×2): 650 mg via ORAL
  Filled 2020-01-05 (×2): qty 2

## 2020-01-05 MED ORDER — ENOXAPARIN SODIUM 40 MG/0.4ML ~~LOC~~ SOLN
40.0000 mg | SUBCUTANEOUS | Status: DC
  Administered 2020-01-06 – 2020-01-14 (×9): 40 mg via SUBCUTANEOUS
  Filled 2020-01-05 (×9): qty 0.4

## 2020-01-05 MED ORDER — LEVOTHYROXINE SODIUM 88 MCG PO TABS
88.0000 ug | ORAL_TABLET | Freq: Every day | ORAL | Status: DC
  Administered 2020-01-06 – 2020-01-14 (×9): 88 ug via ORAL
  Filled 2020-01-05 (×10): qty 1

## 2020-01-05 MED ORDER — STROKE: EARLY STAGES OF RECOVERY BOOK: Freq: Once | Status: AC

## 2020-01-05 MED ORDER — ASPIRIN 325 MG PO TABS
325.0000 mg | ORAL_TABLET | Freq: Every day | ORAL | Status: DC
  Administered 2020-01-06 – 2020-01-14 (×9): 325 mg via ORAL
  Filled 2020-01-05 (×9): qty 1

## 2020-01-05 NOTE — ED Provider Notes (Signed)
[facility] EMERGENCY DEPARTMENT Provider Note   CSN: [id number] Arrival date & time: 01/05/20  1255     History No chief complaint on file.   Jill Rose is a 79 y.o. female.  HPI Patient presents with left-sided weakness.  Began around 1 week ago.  States she has trouble moving her left leg.  Also some difficulty moving her left arm was at times feels as if her speech is slurred.  No headache.  States that is not pain in the left knee although she has been recently treated for pain in the knee.  States she cannot go up the stairs because of weakness in the leg.  States her speech sounds different than baseline.  Couple years ago had possible stroke reviewing records appears to have stroke on the left side of her brain.  Somewhat difficult to determine what symptoms were at that time.  No headache.  No fall.  States she had seen Jilda Panda as her primary but is now switching to [facility].    Past Medical History:  Diagnosis Date  . Diabetes mellitus without complication ([place])   . Essential hypertension   . Hypothyroidism     Patient Active Problem List   Diagnosis Date Noted  . Hypertensive emergency 01/26/2017  . TIA (transient ischemic attack) 01/26/2017  . Hypothyroidism 01/26/2017  . Aphasia   . Essential hypertension   . Diabetes mellitus without complication ([place])     History reviewed. No pertinent surgical history.   OB History   No obstetric history on file.     Family History  Problem Relation Age of Onset  . Heart disease Mother     Social History   Tobacco Use  . Smoking status: Never Smoker  . Smokeless tobacco: Never Used  Substance Use Topics  . Alcohol use: No  . Drug use: No    Home Medications Prior to Admission medications   Medication Sig Start Date End Date Taking? Authorizing Provider  atorvastatin (LIPITOR) 40 MG tablet Take 1 tablet (40 mg total) by mouth daily at 6 PM. 01/28/17   Geradine Girt, DO    Cholecalciferol (VITAMIN D3) 10000 units TABS Take 1 tablet by mouth daily.    [provider]  clopidogrel (PLAVIX) 75 MG tablet Take 1 tablet (75 mg total) by mouth daily. 01/29/17   Geradine Girt, DO  Coenzyme Q10 (CO Q-10 PO) Take 1 capsule by mouth daily.    [provider]  levothyroxine (SYNTHROID, LEVOTHROID) 175 MCG tablet Take 1 tablet (175 mcg total) by mouth daily before breakfast. 01/29/17   Geradine Girt, DO  lisinopril (PRINIVIL,ZESTRIL) 20 MG tablet Take 1 tablet (20 mg total) by mouth daily. 01/29/17   Geradine Girt, DO  Magnesium 500 MG CAPS Take 1 capsule by mouth daily.    [provider]  metFORMIN (GLUCOPHAGE) 500 MG tablet Take 1 tablet (500 mg total) by mouth daily with breakfast. 01/28/17 01/28/18  Geradine Girt, DO  metoprolol tartrate (LOPRESSOR) 25 MG tablet Take 1 tablet (25 mg total) by mouth 2 (two) times daily. 02/16/17 02/11/18  Josue Hector, MD    Allergies    Ammonia, Peroxide Karle Barr peroxide], and Ammonium-containing compounds  Review of Systems   Review of Systems  Constitutional: Negative for appetite change.  HENT: Negative for congestion.   Respiratory: Negative for shortness of breath.   Cardiovascular: Negative for chest pain.  Gastrointestinal: Negative for abdominal pain.  Genitourinary: Negative  for flank pain.  Musculoskeletal: Positive for gait problem.  Neurological: Positive for speech difficulty and weakness. Negative for headaches.  Psychiatric/Behavioral: Negative for confusion.    Physical Exam Updated Vital Signs BP (!) 188/65 (BP Location: Right Arm)   Pulse 90   Temp 98.3 F (36.8 C) (Oral)   Resp 20   SpO2 100%   Physical Exam Vitals and nursing note reviewed.  HENT:     Head: Atraumatic.     Comments: Does not have definite facial droop however patient's husband states the face is little more "swollen" than normal. Eyes:     Extraocular Movements: Extraocular movements intact.      Pupils: Pupils are equal, round, and reactive to light.     Comments: Visual fields grossly intact to confrontation.  Cardiovascular:     Rate and Rhythm: Regular rhythm.  Pulmonary:     Breath sounds: No wheezing or rhonchi.  Abdominal:     Tenderness: There is no abdominal tenderness.  Musculoskeletal:     Cervical back: Neck supple.  Skin:    Capillary Refill: Capillary refill takes less than 2 seconds.  Neurological:     Mental Status: She is alert and oriented to person, place, and time.     Comments: Eye movements intact.  Visual fields grossly intact by confrontation.  May have slight facial asymmetry.  Slight pronator drift on left side.  Strength in left upper extremity may be slightly less than the right, although patient is right-handed.  Mildly decreased strength in left lower extremity.  Good passive movement.  Good flexion-extension of the ankle but straight leg raises not as strong on the left compared to the right.     ED Results / Procedures / Treatments   Labs (all labs ordered are listed, but only abnormal results are displayed) Labs Reviewed  CBC - Abnormal; Notable for the following components:      Result Value   WBC 11.1 (*)    All other components within normal limits  COMPREHENSIVE METABOLIC PANEL - Abnormal; Notable for the following components:   Sodium 132 (*)    Potassium 5.3 (*)    Glucose, Bld 135 (*)    All other components within normal limits  CBG MONITORING, ED - Abnormal; Notable for the following components:   Glucose-Capillary 151 (*)    All other components within normal limits  PROTIME-INR  APTT  DIFFERENTIAL    EKG EKG Interpretation  Date/Time:  Monday January 05 2020 19:45:36 EDT Ventricular Rate:  90 PR Interval:    QRS Duration: 76 QT Interval:  360 QTC Calculation: 441 R Axis:   -11 Text Interpretation: Sinus rhythm LVH with secondary repolarization abnormality Anterior Q waves, possibly due to LVH Confirmed by Benjiman Core [phone]) on 01/05/2020 7:51:29 PM   Radiology CT HEAD WO CONTRAST  Result Date: 01/05/2020 CLINICAL DATA:  Increased left leg weakness. EXAM: CT HEAD WITHOUT CONTRAST TECHNIQUE: Contiguous axial images were obtained from the base of the skull through the vertex without intravenous contrast. COMPARISON:  January 26, 2017 FINDINGS: Brain: There is mild cerebral atrophy with widening of the extra-axial spaces and ventricular dilatation. There are areas of decreased attenuation within the white matter tracts of the supratentorial brain, consistent with microvascular disease changes. Small chronic left basal ganglia lacunar infarcts are noted. Vascular: No hyperdense vessel or unexpected calcification. Skull: Normal. Negative for fracture or focal lesion. Sinuses/Orbits: There is mild sphenoid sinus mucosal thickening. Other: None. IMPRESSION: 1. Generalized cerebral atrophy.  2. Small chronic left basal ganglia lacunar infarcts. 3. No acute intracranial abnormality. Electronically Signed   By: Aram Candela M.D.   On: 01/05/2020 15:42    Procedures Procedures (including critical care time)  Medications Ordered in ED Medications  sodium chloride flush (NS) 0.9 % injection 3 mL (has no administration in time range)    ED Course  I have reviewed the triage vital signs and the nursing notes.  Pertinent labs & imaging results that were available during my care of the patient were reviewed by me and considered in my medical decision making (see chart for details).    MDM Rules/Calculators/A&P                          Patient presents with left-sided weakness.  Initially thought may be only in the leg but with further history it looks as if there could be some upper extremity involvement.  Does have previous stroke but those were left-sided brain findings which should not give these left-sided deficits.  Mild hypertension with initial BP of 188/65.  Head CT shows old stroke on left-sided basal  ganglia.  With new deficits I think patient benefit from mission in the hospital for further work-up.  Has had some vascular abnormalities also.  Not a TPA candidate due to time of onset of a week ago.  Will discuss with neurology also. Final Clinical Impression(s) / ED Diagnoses Final diagnoses:  Weakness    Rx / DC Orders ED Discharge Orders    None       Benjiman Core, MD 01/05/20 2012

## 2020-01-05 NOTE — Consult Note (Addendum)
Neurology Consultation Reason for Consult: L sided weakness Referring Physician: Rubin Payor, N  CC: L sided weakness  History is obtained from: patient, daughter  HPI: Jill Rose is a 79 y.o. female with a history of diabetes, hypertension who presents with left-sided weakness that started abruptly about a week ago.  She states that she was unable to maintain her strength on her left leg, but due to knee problems this was initially attributed to arthritis.  She states that it was weakness but not pain that caused her to have difficulty standing.  Over the intervening week, she has continued to have problems with her left side and was not getting better and therefore decided to seek care today.  Her daughter also feels that her speech is off and she is slurring her words.   LKW: A week ago tpa given?: no, out of window    ROS: A 14 point ROS was performed and is negative except as noted in the HPI.   Past Medical History:  Diagnosis Date  . Diabetes mellitus without complication (HCC)   . Essential hypertension   . Hypothyroidism   . TIA (transient ischemic attack) 2018     Family History  Problem Relation Age of Onset  . Heart disease Mother      Social History:  reports that she has never smoked. She has never used smokeless tobacco. She reports that she does not drink alcohol and does not use drugs.   Exam: Current vital signs: BP (!) 188/65 (BP Location: Right Arm)   Pulse 90   Temp 98.3 F (36.8 C) (Oral)   Resp 20   SpO2 100%  Vital signs in last 24 hours: Temp:  [97.5 F (36.4 C)-98.3 F (36.8 C)] 98.3 F (36.8 C) (06/21 1940) Pulse Rate:  [88-90] 90 (06/21 1940) Resp:  [18-20] 20 (06/21 1942) BP: (163-188)/(65-68) 188/65 (06/21 1940) SpO2:  [99 %-100 %] 100 % (06/21 1940)   Physical Exam  Constitutional: Appears well-developed and well-nourished.  Psych: Affect appropriate to situation Eyes: No scleral injection HENT: No OP obstrucion MSK: no  joint deformities.  Cardiovascular: Normal rate and regular rhythm.  Respiratory: Effort normal, non-labored breathing GI: Soft.  No distension. There is no tenderness.  Skin: WDI  Neuro: Mental Status: Patient is awake, alert, oriented to person, place, month, year, and situation. Patient is able to give a clear and coherent history. She does have an increased latency of response, but unclear if this is due to language barrier versus mild aphasia. Cranial Nerves: II: Visual Fields are full. Pupils are equal, round, and reactive to light.   III,IV, VI: EOMI without ptosis or diploplia.  V: Facial sensation is symmetric to temperature VII: Facial movement with mild left facial weakness VIII: hearing is intact to voice X: Uvula elevates symmetrically XI: Shoulder shrug is symmetric. XII: tongue is midline without atrophy or fasciculations.  Motor: Tone is normal. Bulk is normal. 5/5 strength was present on the right, she has 4+/5 strength in the left arm and leg with mild drift in each Sensory: Sensation is symmetric to light touch and temperature in the arms but diminished in the left leg Cerebellar: Does not perform.   I have reviewed labs in epic and the results pertinent to this consultation are: Sodium 132, indicating mild hyponatremia Creatinine 0.78  I have reviewed the images obtained: CT head-negative  Impression: 79 year old female with left-sided weakness consistent with a small ischemic stroke.  CT is negative, however given her  degree of white matter disease and ischemic changes, a subacute stroke could easily be hiding in there.  He is being admitted for secondary risk factor modification and physical therapy.  Recommendations: - HgbA1c, fasting lipid panel - MRI, MRA  of the brain without contrast - Frequent neuro checks - Echocardiogram - Carotid dopplers - Prophylactic therapy-Antiplatelet med: Aspirin - dose 81 mg with Plavix 75 mg daily for 3 weeks - Risk  factor modification - Telemetry monitoring - PT consult, OT consult, Speech consult - Stroke team to follow    Roland Rack, MD [facility] [phone]  If 7pm- 7am, please page neurology on call as listed in [place].

## 2020-01-05 NOTE — H&P (Addendum)
History and Physical    Jill Rose SWN:[id number] DOB: [birth year] DOA: 01/05/2020  PCP: Jilda Panda, MD  Patient coming from: Home  I have personally briefly reviewed patient's old medical records in [place]  Chief Complaint: L sided weakness  HPI: Jill Rose is a 79 y.o. female with medical history significant of HTN, DM (diet controlled it seems), hypothyroidism, and prior TIA.  Pt with L sided weakness, LLE more so than LUE for past 1 week.  Symptoms constant, also feels like speech slurred.  No headache.  Difficulty getting up stairs due to weakness in L leg.   ED Course: CT head just shows old L basal ganglia strokes.  Neuro consulted, they are concerned she had a stroke.  Hospitalist asked to admit for stroke work up.   Review of Systems: As per HPI, otherwise all review of systems negative.  Past Medical History:  Diagnosis Date  . Diabetes mellitus without complication ([place])   . Essential hypertension   . Hypothyroidism   . TIA (transient ischemic attack) 2018    History reviewed. No pertinent surgical history.   reports that she has never smoked. She has never used smokeless tobacco. She reports that she does not drink alcohol and does not use drugs.  Allergies  Allergen Reactions  . Ammonia Shortness Of Breath, Itching and Swelling  . Peroxide [Hydrogen Peroxide] Shortness Of Breath, Itching and Swelling    Family History  Problem Relation Age of Onset  . Heart disease Mother      Prior to Admission medications   Medication Sig Start Date End Date Taking? Authorizing Provider  aspirin EC 81 MG tablet Take 162 mg by mouth daily before supper. Swallow whole.   Yes [provider]  b complex vitamins tablet Take 1 tablet by mouth daily with lunch.   Yes [provider]  Barberry-Oreg Grape-Goldenseal (BERBERINE COMPLEX PO) Take 1 tablet by mouth daily with lunch.   Yes [provider]  Cholecalciferol (VITAMIN  D3 PO) Take 1 tablet by mouth daily with lunch.    Yes [provider]  Cyanocobalamin (VITAMIN B-12 PO) Take 1 tablet by mouth daily with lunch.   Yes [provider]  levothyroxine (SYNTHROID) 88 MCG tablet Take 88 mcg by mouth daily before breakfast. 12/07/19  Yes [provider]  losartan (COZAAR) 50 MG tablet Take 50 mg by mouth at bedtime. 12/29/19  Yes [provider]  Menaquinone-7 (VITAMIN K2 PO) Take 1 tablet by mouth daily with lunch.   Yes [provider]    Physical Exam: Vitals:   01/05/20 1319 01/05/20 1942  BP: (!) 163/68 (!) 188/65  Pulse: 88 90  Resp: 18 20  Temp: (!) 97.5 F (36.4 C) 98.3 F (36.8 C)  TempSrc: Oral Oral  SpO2: 99% 100%    Constitutional: NAD, calm, comfortable Eyes: PERRL, lids and conjunctivae normal ENMT: Mucous membranes are moist. Posterior pharynx clear of any exudate or lesions.Normal dentition.  Neck: normal, supple, no masses, no thyromegaly Respiratory: clear to auscultation bilaterally, no wheezing, no crackles. Normal respiratory effort. No accessory muscle use.  Cardiovascular: Regular rate and rhythm, no murmurs / rubs / gallops. No extremity edema. 2+ pedal pulses. No carotid bruits.  Abdomen: no tenderness, no masses palpated. No hepatosplenomegaly. Bowel sounds positive.  Musculoskeletal: no clubbing / cyanosis. No joint deformity upper and lower extremities. Good ROM, no contractures. Normal muscle tone.  Skin: no rashes, lesions, ulcers. No induration Neurologic: Slight pronator drift  in LUE.  4/5 strength in LLE, primarily weak with straight leg raise. Psychiatric: Normal judgment and insight. Alert and oriented x 3. Normal mood.    Labs on Admission: I have personally reviewed following labs and imaging studies  CBC: Recent Labs  Lab 01/05/20 1401  WBC 11.1*  NEUTROABS 6.5  HGB 13.0  HCT 41.7  MCV 87.4  PLT 258   Basic Metabolic Panel: Recent Labs  Lab 01/05/20 1401  NA  132*  K 5.3*  CL 98  CO2 22  GLUCOSE 135*  BUN 15  CREATININE 0.78  CALCIUM 9.3   GFR: CrCl cannot be calculated (Unknown ideal weight.). Liver Function Tests: Recent Labs  Lab 01/05/20 1401  AST 18  ALT 12  ALKPHOS 57  BILITOT 0.3  PROT 8.0  ALBUMIN 3.5   No results for input(s): LIPASE, AMYLASE in the last 168 hours. No results for input(s): AMMONIA in the last 168 hours. Coagulation Profile: Recent Labs  Lab 01/05/20 1401  INR 1.0   Cardiac Enzymes: No results for input(s): CKTOTAL, CKMB, CKMBINDEX, TROPONINI in the last 168 hours. BNP (last 3 results) No results for input(s): PROBNP in the last 8760 hours. HbA1C: No results for input(s): HGBA1C in the last 72 hours. CBG: Recent Labs  Lab 01/05/20 1949  GLUCAP 151*   Lipid Profile: No results for input(s): CHOL, HDL, LDLCALC, TRIG, CHOLHDL, LDLDIRECT in the last 72 hours. Thyroid Function Tests: No results for input(s): TSH, T4TOTAL, FREET4, T3FREE, THYROIDAB in the last 72 hours. Anemia Panel: No results for input(s): VITAMINB12, FOLATE, FERRITIN, TIBC, IRON, RETICCTPCT in the last 72 hours. Urine analysis: No results found for: COLORURINE, APPEARANCEUR, LABSPEC, PHURINE, GLUCOSEU, HGBUR, BILIRUBINUR, KETONESUR, PROTEINUR, UROBILINOGEN, NITRITE, LEUKOCYTESUR  Radiological Exams on Admission: CT HEAD WO CONTRAST  Result Date: 01/05/2020 CLINICAL DATA:  Increased left leg weakness. EXAM: CT HEAD WITHOUT CONTRAST TECHNIQUE: Contiguous axial images were obtained from the base of the skull through the vertex without intravenous contrast. COMPARISON:  January 26, 2017 FINDINGS: Brain: There is mild cerebral atrophy with widening of the extra-axial spaces and ventricular dilatation. There are areas of decreased attenuation within the white matter tracts of the supratentorial brain, consistent with microvascular disease changes. Small chronic left basal ganglia lacunar infarcts are noted. Vascular: No hyperdense vessel  or unexpected calcification. Skull: Normal. Negative for fracture or focal lesion. Sinuses/Orbits: There is mild sphenoid sinus mucosal thickening. Other: None. IMPRESSION: 1. Generalized cerebral atrophy. 2. Small chronic left basal ganglia lacunar infarcts. 3. No acute intracranial abnormality. Electronically Signed   By: Aram Candela M.D.   On: 01/05/2020 15:42    EKG: Independently reviewed.  Assessment/Plan Principal Problem:   Acute ischemic stroke [facility]) Active Problems:   Hypothyroidism   Essential hypertension    1. Acute ischemic stroke - 1. Stroke pathway 2. ASA 325 for now 3. Neuro consult 4. Tele monitor 5. 2d echo 6. MRI brain 7. Carotid dopplers 8. LFTs, A1C 9. PT/OT/SLP 2. HTN - 1. Cont home losartan 3. Hypothyroidism - 1. Cont synthroid 4. ? DM2 - 1. Seems to be diet controlled 2. Will just check A1C for now 3. Check CBG AC/HS  DVT prophylaxis: Lovenox Code Status: Full Family Communication: Husband at bedside Disposition Plan: Home after stroke workup completed Consults called: Neuro Admission status: Place in 18    Denzel Etienne M. DO [facility]  How to contact the [facility] Attending or Consulting provider 7A - 7P or covering provider during after hours 7P -7A, for  this patient?  1. Check the care team in [facility] and look for a) attending/consulting [facility] provider listed and b) the [facility] team listed 2. Log into [email or web address]  Amion Physician Scheduling and messaging for groups and whole hospitals  On call and physician scheduling software for group practices, residents, hospitalists and other medical providers for call, clinic, rotation and shift schedules. On[employer] is a hospital-wide system for scheduling doctors and paging doctors on call. EasyPlot is for scientific plotting and data analysis.  [email or web address]  and use [facility]'s universal password to access. If you do not have the password, please contact the hospital operator.   3. Locate the [facility] provider you are looking for under [facility] and page to a number that you can be directly reached. 4. If you still have difficulty reaching the provider, please page the [facility] (Director on Call) for the Hospitalists listed on amion for assistance.  01/05/2020, 10:19 PM

## 2020-01-05 NOTE — ED Triage Notes (Signed)
Patient arrived by [facility] and has 1 week of increased left leg weakness that MD reports related to arthritis. Patient reports that she had stroke in past and has had some residual speech deficits from that, reports that her speech is normal for her. Also complains of neck pain since am. No drift noted, alert and orirnted

## 2020-01-05 NOTE — ED Notes (Signed)
CBG Results of 151 reported to Phill, RN.

## 2020-01-06 ENCOUNTER — Observation Stay (HOSPITAL_COMMUNITY): Payer: Medicare HMO

## 2020-01-06 ENCOUNTER — Observation Stay (HOSPITAL_BASED_OUTPATIENT_CLINIC_OR_DEPARTMENT_OTHER): Payer: Medicare HMO

## 2020-01-06 ENCOUNTER — Inpatient Hospital Stay (HOSPITAL_COMMUNITY): Payer: Medicare HMO

## 2020-01-06 DIAGNOSIS — Z20822 Contact with and (suspected) exposure to covid-19: Secondary | ICD-10-CM | POA: Diagnosis present

## 2020-01-06 DIAGNOSIS — Z8249 Family history of ischemic heart disease and other diseases of the circulatory system: Secondary | ICD-10-CM | POA: Diagnosis not present

## 2020-01-06 DIAGNOSIS — G8194 Hemiplegia, unspecified affecting left nondominant side: Secondary | ICD-10-CM | POA: Diagnosis present

## 2020-01-06 DIAGNOSIS — R799 Abnormal finding of blood chemistry, unspecified: Secondary | ICD-10-CM | POA: Diagnosis not present

## 2020-01-06 DIAGNOSIS — I358 Other nonrheumatic aortic valve disorders: Secondary | ICD-10-CM | POA: Diagnosis present

## 2020-01-06 DIAGNOSIS — Z79890 Hormone replacement therapy (postmenopausal): Secondary | ICD-10-CM | POA: Diagnosis not present

## 2020-01-06 DIAGNOSIS — Z91048 Other nonmedicinal substance allergy status: Secondary | ICD-10-CM | POA: Diagnosis not present

## 2020-01-06 DIAGNOSIS — I63511 Cerebral infarction due to unspecified occlusion or stenosis of right middle cerebral artery: Secondary | ICD-10-CM | POA: Diagnosis present

## 2020-01-06 DIAGNOSIS — R0989 Other specified symptoms and signs involving the circulatory and respiratory systems: Secondary | ICD-10-CM | POA: Diagnosis not present

## 2020-01-06 DIAGNOSIS — I110 Hypertensive heart disease with heart failure: Secondary | ICD-10-CM | POA: Diagnosis present

## 2020-01-06 DIAGNOSIS — Z79899 Other long term (current) drug therapy: Secondary | ICD-10-CM | POA: Diagnosis not present

## 2020-01-06 DIAGNOSIS — E669 Obesity, unspecified: Secondary | ICD-10-CM | POA: Diagnosis present

## 2020-01-06 DIAGNOSIS — I639 Cerebral infarction, unspecified: Secondary | ICD-10-CM | POA: Diagnosis not present

## 2020-01-06 DIAGNOSIS — I342 Nonrheumatic mitral (valve) stenosis: Secondary | ICD-10-CM

## 2020-01-06 DIAGNOSIS — D72829 Elevated white blood cell count, unspecified: Secondary | ICD-10-CM | POA: Diagnosis present

## 2020-01-06 DIAGNOSIS — Z6830 Body mass index (BMI) 30.0-30.9, adult: Secondary | ICD-10-CM | POA: Diagnosis not present

## 2020-01-06 DIAGNOSIS — E871 Hypo-osmolality and hyponatremia: Secondary | ICD-10-CM | POA: Diagnosis present

## 2020-01-06 DIAGNOSIS — I5032 Chronic diastolic (congestive) heart failure: Secondary | ICD-10-CM | POA: Diagnosis present

## 2020-01-06 DIAGNOSIS — I10 Essential (primary) hypertension: Secondary | ICD-10-CM | POA: Diagnosis not present

## 2020-01-06 DIAGNOSIS — N182 Chronic kidney disease, stage 2 (mild): Secondary | ICD-10-CM | POA: Diagnosis not present

## 2020-01-06 DIAGNOSIS — M1990 Unspecified osteoarthritis, unspecified site: Secondary | ICD-10-CM | POA: Diagnosis present

## 2020-01-06 DIAGNOSIS — E1142 Type 2 diabetes mellitus with diabetic polyneuropathy: Secondary | ICD-10-CM | POA: Diagnosis present

## 2020-01-06 DIAGNOSIS — R29810 Facial weakness: Secondary | ICD-10-CM | POA: Diagnosis present

## 2020-01-06 DIAGNOSIS — R7309 Other abnormal glucose: Secondary | ICD-10-CM | POA: Diagnosis not present

## 2020-01-06 DIAGNOSIS — E039 Hypothyroidism, unspecified: Secondary | ICD-10-CM | POA: Diagnosis present

## 2020-01-06 DIAGNOSIS — Z7982 Long term (current) use of aspirin: Secondary | ICD-10-CM | POA: Diagnosis not present

## 2020-01-06 DIAGNOSIS — I69354 Hemiplegia and hemiparesis following cerebral infarction affecting left non-dominant side: Secondary | ICD-10-CM | POA: Diagnosis not present

## 2020-01-06 DIAGNOSIS — E1165 Type 2 diabetes mellitus with hyperglycemia: Secondary | ICD-10-CM | POA: Diagnosis present

## 2020-01-06 DIAGNOSIS — E785 Hyperlipidemia, unspecified: Secondary | ICD-10-CM | POA: Diagnosis present

## 2020-01-06 DIAGNOSIS — Z8673 Personal history of transient ischemic attack (TIA), and cerebral infarction without residual deficits: Secondary | ICD-10-CM | POA: Diagnosis not present

## 2020-01-06 DIAGNOSIS — Z883 Allergy status to other anti-infective agents status: Secondary | ICD-10-CM | POA: Diagnosis not present

## 2020-01-06 DIAGNOSIS — E6609 Other obesity due to excess calories: Secondary | ICD-10-CM | POA: Diagnosis not present

## 2020-01-06 LAB — LIPID PANEL
Cholesterol: 265 mg/dL — ABNORMAL HIGH (ref 0–200)
HDL: 56 mg/dL (ref >40)
LDL Cholesterol: 196 mg/dL — ABNORMAL HIGH (ref 0–99)
Total CHOL/HDL Ratio: 4.7 RATIO
Triglycerides: 65 mg/dL (ref <150)
VLDL: 13 mg/dL (ref 0–40)

## 2020-01-06 LAB — BASIC METABOLIC PANEL
Anion gap: 11 (ref 5–15)
BUN: 13 mg/dL (ref 8–23)
CO2: 24 mmol/L (ref 22–32)
Calcium: 9.1 mg/dL (ref 8.9–10.3)
Chloride: 102 mmol/L (ref 98–111)
Creatinine, Ser: 0.72 mg/dL (ref 0.44–1.00)
GFR calc Af Amer: >60 mL/min (ref >60)
GFR calc non Af Amer: >60 mL/min (ref >60)
Glucose, Bld: 161 mg/dL — ABNORMAL HIGH (ref 70–99)
Potassium: 4.3 mmol/L (ref 3.5–5.1)
Sodium: 137 mmol/L (ref 135–145)

## 2020-01-06 LAB — GLUCOSE, CAPILLARY
Glucose-Capillary: 152 mg/dL — ABNORMAL HIGH (ref 70–99)
Glucose-Capillary: 176 mg/dL — ABNORMAL HIGH (ref 70–99)

## 2020-01-06 LAB — CBG MONITORING, ED: Glucose-Capillary: 207 mg/dL — ABNORMAL HIGH (ref 70–99)

## 2020-01-06 LAB — ECHOCARDIOGRAM COMPLETE

## 2020-01-06 MED ORDER — IOHEXOL 350 MG/ML SOLN
80.0000 mL | Freq: Once | INTRAVENOUS | Status: AC | PRN
  Administered 2020-01-06: 80 mL via INTRAVENOUS

## 2020-01-06 MED ORDER — CLOPIDOGREL BISULFATE 75 MG PO TABS
75.0000 mg | ORAL_TABLET | Freq: Every day | ORAL | Status: DC
  Administered 2020-01-07 – 2020-01-14 (×8): 75 mg via ORAL
  Filled 2020-01-06 (×8): qty 1

## 2020-01-06 MED ORDER — CLOPIDOGREL BISULFATE 300 MG PO TABS
300.0000 mg | ORAL_TABLET | Freq: Once | ORAL | Status: AC
  Administered 2020-01-06: 300 mg via ORAL
  Filled 2020-01-06: qty 1

## 2020-01-06 MED ORDER — PERFLUTREN LIPID MICROSPHERE
1.0000 mL | INTRAVENOUS | Status: DC | PRN
  Filled 2020-01-06: qty 10

## 2020-01-06 NOTE — ED Notes (Signed)
Lunch Tray Ordered @ 1048. °

## 2020-01-06 NOTE — Evaluation (Signed)
Physical Therapy Evaluation Patient Details Name: Jill Rose MRN: [id number] DOB: 16-May-1941 Today's Date: 01/06/2020   History of Present Illness  Pt is a 79 y/o female admitted secondary to worsening L sided weakness. 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 PMH includes HTN.   Clinical Impression  Pt admitted secondary to problem above with deficits below. Pt requiring mod A for bed mobility and to stand this session. Pt with L knee instability in standing and required manual assist to block. Pt was previously very independent. Feel she would be excellent candidate for CIR. Will continue to follow acutely to maximize functional mobility independence and safety.     Follow Up Recommendations CIR    Equipment Recommendations  Other (comment) (TBD)    Recommendations for Other Services       Precautions / Restrictions Precautions Precautions: Fall Restrictions Weight Bearing Restrictions: No      Mobility  Bed Mobility Overal bed mobility: Needs Assistance Bed Mobility: Supine to Sit;Sit to Supine     Supine to sit: Mod assist Sit to supine: Mod assist   General bed mobility comments: Mod A for trunk assist and LE assist. Required assist to scoot hips to EOB as well. Increased time required.   Transfers Overall transfer level: Needs assistance Equipment used: 1 person hand held assist Transfers: Sit to/from Stand Sit to Stand: Mod assist         General transfer comment: PT stood in front of pt and had pt hold to PT arms. Pt requiring mod A for lift assist and steadying to stand and required manual blocking of L knee. Pt unable to take steps at EOB this session.   Ambulation/Gait                Stairs            Wheelchair Mobility    Modified Rankin (Stroke Patients Only) Modified Rankin (Stroke Patients Only) Pre-Morbid Rankin Score: No symptoms Modified Rankin: Moderately severe disability     Balance  Overall balance assessment: Needs assistance Sitting-balance support: No upper extremity supported;Feet supported Sitting balance-Leahy Scale: Fair     Standing balance support: Bilateral upper extremity supported;During functional activity Standing balance-Leahy Scale: Poor Standing balance comment: Reliant on UE and external support                              Pertinent Vitals/Pain Pain Assessment: No/denies pain    Home Living Family/patient expects to be discharged to:: Private residence Living Arrangements: Spouse/significant other Available Help at Discharge: Family Type of Home: House Home Access: Stairs to enter Entrance Stairs-Rails: Doctor, general practice of Steps: 3-4 Home Layout: Two level Home Equipment: None      Prior Function Level of Independence: Independent               Hand Dominance        Extremity/Trunk Assessment   Upper Extremity Assessment Upper Extremity Assessment: Defer to OT evaluation    Lower Extremity Assessment Lower Extremity Assessment: LLE deficits/detail LLE Deficits / Details: Grossly 3+/5 throughout LLE. Functional weakness noted as LLE would buckle    Cervical / Trunk Assessment Cervical / Trunk Assessment: Normal  Communication   Communication: No difficulties  Cognition Arousal/Alertness: Awake/alert Behavior During Therapy: WFL for tasks assessed/performed Overall Cognitive Status: Within Functional Limits for tasks assessed  General Comments General comments (skin integrity, edema, etc.): Pt's sister present in room     Exercises     Assessment/Plan    PT Assessment Patient needs continued PT services  PT Problem List Decreased strength;Decreased range of motion;Decreased balance;Decreased mobility;Decreased coordination;Decreased knowledge of use of DME;Decreased knowledge of precautions       PT Treatment Interventions  DME instruction;Gait training;Functional mobility training;Therapeutic activities;Stair training;Therapeutic exercise;Balance training;Neuromuscular re-education;Patient/family education    PT Goals (Current goals can be found in the Care Plan section)  Acute Rehab PT Goals Patient Stated Goal: to be able to walk  PT Goal Formulation: With patient Time For Goal Achievement: 01/20/20 Potential to Achieve Goals: Good    Frequency Min 5X/week   Barriers to discharge        Co-evaluation               AM-PAC PT "6 Clicks" Mobility  Outcome Measure Help needed turning from your back to your side while in a flat bed without using bedrails?: A Lot Help needed moving from lying on your back to sitting on the side of a flat bed without using bedrails?: A Lot Help needed moving to and from a bed to a chair (including a wheelchair)?: A Lot Help needed standing up from a chair using your arms (e.g., wheelchair or bedside chair)?: A Lot Help needed to walk in hospital room?: Total Help needed climbing 3-5 steps with a railing? : Total 6 Click Score: 10    End of Session Equipment Utilized During Treatment: Gait belt Activity Tolerance: Patient tolerated treatment well Patient left: in bed;with call bell/phone within reach;with family/visitor present (in ED ) Nurse Communication: Mobility status PT Visit Diagnosis: Unsteadiness on feet (R26.81);Difficulty in walking, not elsewhere classified (R26.2);Hemiplegia and hemiparesis Hemiplegia - Right/Left: Left Hemiplegia - caused by: Cerebral infarction    Time: 1250-1307 PT Time Calculation (min) (ACUTE ONLY): 17 min   Charges:   PT Evaluation $PT Eval Moderate Complexity: 1 Mod          Reuel Derby, PT, DPT  Acute Rehabilitation Services  Pager: [phone] Office: [phone]   Rudean Hitt 01/06/2020, 2:05 PM

## 2020-01-06 NOTE — Progress Notes (Signed)
  Echocardiogram 2D Echocardiogram has been performed.  Leta Jungling M 01/06/2020, 8:47 AM

## 2020-01-06 NOTE — ED Notes (Signed)
Patient transported to MRI 

## 2020-01-06 NOTE — Progress Notes (Signed)
Inpatient Rehab Admissions Coordinator Note:   Per PT recommendations, pt was screened for CIR candidacy by Estill Dooms, PT, DPT.  At this time we are recommending a CIR consult.  I will place an order per our protocol.  Please contact me with questions.   Estill Dooms, PT, DPT [phone] 01/06/20 4:17 PM

## 2020-01-06 NOTE — Progress Notes (Signed)
STROKE TEAM PROGRESS NOTE   INTERVAL HISTORY I have personally reviewed history of presenting illness with the patient, reviewed electronic medical records and imaging films in PACS.  She presented with 1 week history of left-sided weakness.  MRI scan shows acute and subacute infarcts involving the right corona radiata, posterior temporal and occipital lobes in the PCA distribution mostly.  Remote lacunar infarcts are noted bilateral subcortical regions and left pons. CT angiogram shows severe intracranial atherosclerotic changes without large vessel occlusion.  Echocardiogram shows normal ejection fraction without cardiac source of embolism.  LDL cholesterol is 196 mg percent. Vitals:   01/06/20 0345 01/06/20 0400 01/06/20 0600 01/06/20 0700  BP:   (!) 157/60 (!) 179/70  Pulse: 94 95 79 88  Resp: (!) 23 (!) 21 17 17   Temp:      TempSrc:      SpO2: 97% 100% 99% 100%    CBC:  Recent Labs  Lab 01/05/20 1401  WBC 11.1*  NEUTROABS 6.5  HGB 13.0  HCT 41.7  MCV 87.4  PLT 258    Basic Metabolic Panel:  Recent Labs  Lab 01/05/20 1401 01/06/20 0842  NA 132* 137  K 5.3* 4.3  CL 98 102  CO2 22 24  GLUCOSE 135* 161*  BUN 15 13  CREATININE 0.78 0.72  CALCIUM 9.3 9.1   Lipid Panel:     Component Value Date/Time   CHOL 265 (H) 01/06/2020 0235   TRIG 65 01/06/2020 0235   HDL 56 01/06/2020 0235   CHOLHDL 4.7 01/06/2020 0235   VLDL 13 01/06/2020 0235   LDLCALC 196 (H) 01/06/2020 0235   HgbA1c:  Lab Results  Component Value Date   HGBA1C 8.5 (H) 01/27/2017   Urine Drug Screen:     Component Value Date/Time   LABOPIA NONE DETECTED 01/26/2017 1939   COCAINSCRNUR NONE DETECTED 01/26/2017 1939   LABBENZ NONE DETECTED 01/26/2017 1939   AMPHETMU NONE DETECTED 01/26/2017 1939   THCU NONE DETECTED 01/26/2017 1939   LABBARB NONE DETECTED 01/26/2017 1939    Alcohol Level No results found for: ETH  IMAGING past 24 hours DG Chest 2 View  Result Date: 01/05/2020 CLINICAL DATA:   Acute ischemic stroke. EXAM: CHEST - 2 VIEW COMPARISON:  10/17/2013 FINDINGS: Lower lung volumes from prior exam. There are patchy opacities at the left greater than right lung base. Mild cardiomegaly. Aortic atherosclerosis. No significant pleural effusion. There may be minimal fluid in the fissures. No pneumothorax. No acute osseous abnormalities are seen. IMPRESSION: 1. Patchy bibasilar opacities, left greater than right, may be atelectasis or pneumonia. 2. Mild cardiomegaly.  Aortic Atherosclerosis (ICD10-I70.0). Electronically Signed   By: 12/17/2013 M.D.   On: 01/05/2020 22:43   CT HEAD WO CONTRAST  Result Date: 01/05/2020 CLINICAL DATA:  Increased left leg weakness. EXAM: CT HEAD WITHOUT CONTRAST TECHNIQUE: Contiguous axial images were obtained from the base of the skull through the vertex without intravenous contrast. COMPARISON:  January 26, 2017 FINDINGS: Brain: There is mild cerebral atrophy with widening of the extra-axial spaces and ventricular dilatation. There are areas of decreased attenuation within the white matter tracts of the supratentorial brain, consistent with microvascular disease changes. Small chronic left basal ganglia lacunar infarcts are noted. Vascular: No hyperdense vessel or unexpected calcification. Skull: Normal. Negative for fracture or focal lesion. Sinuses/Orbits: There is mild sphenoid sinus mucosal thickening. Other: None. IMPRESSION: 1. Generalized cerebral atrophy. 2. Small chronic left basal ganglia lacunar infarcts. 3. No acute intracranial abnormality. Electronically Signed  By: Aram Candela M.D.   On: 01/05/2020 15:42   MR BRAIN WO CONTRAST  Result Date: 01/06/2020 CLINICAL DATA:  Left-sided weakness over the last week. EXAM: MRI HEAD WITHOUT CONTRAST TECHNIQUE: Multiplanar, multiecho pulse sequences of the brain and surrounding structures were obtained without intravenous contrast. COMPARISON:  CT head without contrast 01/05/2020 FINDINGS: Brain: Arcs  the diffusion-weighted images demonstrate acute/subacute nonhemorrhagic infarcts involving the right corona radiata. Additional subcortical white matter infarcts are present in the posterior right temporal and right occipital lobes. Minimal cortical involvement is present. Extensive white matter disease is present bilaterally. Remote lacunar infarcts are present within the corona radiata bilaterally. Remote lacunar infarcts are also present in the left lentiform nucleus. Remote lacunar infarct is present in the left thalamus. Remote white matter infarcts are present adjacent to the atrium of the right lateral ventricle. A remote lacunar infarct is present in the left paramedian pons. White matter changes extend through the brainstem. Remote lacunar infarcts are present in the posteroinferior right cerebellum. The ventricles are proportionate to the degree of atrophy. No significant extraaxial fluid collection is present. The internal auditory canals are within normal limits. Vascular: Flow is present in the major intracranial arteries. Skull and upper cervical spine: Mild degenerative changes are present in the upper cervical spine. Craniocervical junction is normal. Marrow signal is normal. Sinuses/Orbits: A fluid level is present in the right sphenoid sinus. The paranasal sinuses and mastoid air cells are otherwise clear. Bilateral lens replacements are noted. Globes and orbits are otherwise unremarkable. IMPRESSION: 1. Acute/subacute nonhemorrhagic infarcts involving the right corona radiata and posterior right temporal and occipital lobes. Findings correspond to the subacute time frame of left-sided weakness. 2. Remote lacunar infarcts of the corona radiata bilaterally, left thalamus, and left paramedian pons. 3. Extensive white matter disease likely reflects the sequela of chronic microvascular ischemia. 4. Right sphenoid sinus disease. Electronically Signed   By: Marin Roberts M.D.   On: 01/06/2020  05:33   ECHOCARDIOGRAM COMPLETE  Result Date: 01/06/2020    ECHOCARDIOGRAM REPORT   Patient Name:   Jill Rose Date of Exam: 01/06/2020 Medical Rec #:  [id number]      Height:       61.0 in Accession #:    [id number]     Weight:       174.4 lb Date of Birth:  Sep 26, 1940       BSA:          1.782 m Patient Age:    78 years       BP:           179/70 mmHg Patient Gender: F              HR:           88 bpm. Exam Location:  Inpatient Procedure: 2D Echo Indications:     Stroke 434.91 / I163.9  History:         Patient has prior history of Echocardiogram examinations, most                  recent 01/28/2017. TIA; Risk Factors:Hypertension and Diabetes.                  Thyroid disease.  Sonographer:     Leta Jungling RDCS Referring Phys:  [id number] [place] Diagnosing Phys: Armanda Magic MD  Sonographer Comments: No IV access. IMPRESSIONS  1. There is a mid cavitary resting gradient of due to hyperdynamic LVF.     Marland Kitchen  Left ventricular ejection fraction, by estimation, is 65 to 70%. The left ventricle has normal function. The left ventricle has no regional wall motion abnormalities. Left ventricular diastolic parameters are consistent with Grade I diastolic dysfunction (impaired relaxation). Elevated left ventricular end-diastolic pressure.  2. Right ventricular systolic function is normal. The right ventricular size is normal. Tricuspid regurgitation signal is inadequate for assessing PA pressure.  3. The mitral valve is normal in structure. No evidence of mitral valve regurgitation. Mild mitral stenosis. The mean mitral valve gradient is 6.5 mmHg.  4. The aortic valve is tricuspid. Aortic valve regurgitation is not visualized. Mild to moderate aortic valve sclerosis/calcification is present, without any evidence of aortic stenosis. Aortic valve mean gradient measures 9.0 mmHg. Aortic valve Vmax measures 1.97 m/s.  5. Aortic dilatation noted. 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  6.  The inferior vena cava is normal in size with greater than 50% respiratory variability, suggesting right atrial pressure of 3 mmHg. FINDINGS  Left Ventricle: There is a mid cavitary resting gradient of 24mmHg due to hyperdynamic LVF. Left ventricular ejection fraction, by estimation, is 65 to 70%. The left ventricle has normal function. The left ventricle has no regional wall motion abnormalities. The left ventricular internal cavity size was normal in size. There is no left ventricular hypertrophy. Left ventricular diastolic parameters are consistent with Grade I diastolic dysfunction (impaired relaxation). Elevated left ventricular end-diastolic pressure. Right Ventricle: The right ventricular size is normal. No increase in right ventricular wall thickness. Right ventricular systolic function is normal. Tricuspid regurgitation signal is inadequate for assessing PA pressure. Left Atrium: Left atrial size was normal in size. Right Atrium: Right atrial size was normal in size. Pericardium: There is no evidence of pericardial effusion. Mitral Valve: The mitral valve is normal in structure. Normal mobility of the mitral valve leaflets. Severe mitral annular calcification. No evidence of mitral valve regurgitation. Mild mitral valve stenosis. MV peak gradient, 18.6 mmHg. The mean mitral valve gradient is 6.5 mmHg. Tricuspid Valve: The tricuspid valve is normal in structure. Tricuspid valve regurgitation is not demonstrated. No evidence of tricuspid stenosis. Aortic Valve: The aortic valve is tricuspid. Aortic valve regurgitation is not visualized. Mild to moderate aortic valve sclerosis/calcification is present, without any evidence of aortic stenosis. Aortic valve mean gradient measures 9.0 mmHg. Aortic valve peak gradient measures 15.5 mmHg. Aortic valve area, by VTI measures 1.06 cm. Pulmonic Valve: The pulmonic valve was normal in structure. Pulmonic valve regurgitation is trivial. No evidence of pulmonic stenosis.  Aorta: Aortic dilatation noted. 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 Venous: The inferior vena cava was not well visualized. The inferior vena cava is normal in size with greater than 50% respiratory variability, suggesting right atrial pressure of 3 mmHg. IAS/Shunts: No atrial level shunt detected by color flow Doppler.  LEFT VENTRICLE PLAX 2D LVIDd:         3.80 cm  Diastology LVIDs:         2.60 cm  LV e' lateral:   4.46 cm/s LV PW:         1.00 cm  LV E/e' lateral: 26.2 LV IVS:        1.10 cm  LV e' medial:    4.13 cm/s LVOT diam:     1.60 cm  LV E/e' medial:  28.3 LV SV:         38 LV SV Index:   21 LVOT Area:  2.01 cm  RIGHT VENTRICLE TAPSE (M-mode): 1.6 cm LEFT ATRIUM             Index LA diam:        3.10 cm 1.74 cm/m LA Vol (A2C):   30.1 ml 16.89 ml/m LA Vol (A4C):   29.3 ml 16.44 ml/m LA Biplane Vol: 34.9 ml 19.59 ml/m  AORTIC VALVE AV Area (Vmax):    1.30 cm AV Area (Vmean):   1.34 cm AV Area (VTI):     1.06 cm AV Vmax:           197.00 cm/s AV Vmean:          135.000 cm/s AV VTI:            0.355 m AV Peak Grad:      15.5 mmHg AV Mean Grad:      9.0 mmHg LVOT Vmax:         127.00 cm/s LVOT Vmean:        89.900 cm/s LVOT VTI:          0.187 m LVOT/AV VTI ratio: 0.53  AORTA Ao Root diam: 3.40 cm MITRAL VALVE MV Area (PHT): 2.48 cm     SHUNTS MV Peak grad:  18.6 mmHg    Systemic VTI:  0.19 m MV Mean grad:  6.5 mmHg     Systemic Diam: 1.60 cm MV Vmax:       2.16 m/s MV Vmean:      117.5 cm/s MV Decel Time: 306 msec MV E velocity: 117.00 cm/s MV A velocity: 209.00 cm/s MV E/A ratio:  0.56 Armanda Magic MD Electronically signed by Armanda Magic MD Signature Date/Time: 01/06/2020/8:59:43 AM    Final (Updated)     PHYSICAL EXAM Pleasant elderly lady not in distress. . Afebrile. Head is nontraumatic. Neck is supple without bruit.    Cardiac exam no murmur or gallop. Lungs are clear to auscultation. Distal pulses are well felt. Neurological Exam :  Awake alert oriented x  3 normal speech and language. Mild left lower face asymmetry. Tongue midline. No drift. Mild diminished fine finger movements on left. Orbits right over left upper extremity. Mild left grip weak.. Normal sensation . Normal coordination.  ASSESSMENT/PLAN [name] is a 79 y.o. female with history of diabetes, hypertension presenting with L sided weakness.   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 Atrophy. Old L basal ganglia lacunes.   MRI  R corona radiata and posterior R temporal and occipital lobe infarcts. Old B corona radiata, L thalamic and L paramedian pontine infarcts. Sinus dz. Extensive white matter disease.  MRA  No proximal occlusion. Multifocal atherosclerosis   CTA head & neck severe intracranial atherosclerosis w/o LVO. Proximal R ICA 25% stenosis. L VA w/ atherosclerosis and multiple stenoses; Severe distal L VA. R VA atherosclerosis w/o stenosis   Carotid Doppler  pending   2D Echo EF 65-70%. No source of embolus   LDL 196  HgbA1c pending   Lovenox 40 mg sq daily for VTE prophylaxis  aspirin 81 mg daily prior to admission, now on aspirin 325 mg daily and clopidogrel 75 mg daily following plavix load. Continue DAPT x 3 months then plavix alone  Therapy recommendations:  CIR  Disposition:  pending   Hypertension  Stable . Permissive hypertension (OK if < 220/120) but gradually normalize in 5-7 days . Long-term BP goal  normotensive  Hyperlipidemia  Home meds:  No statin  On lipitor 80  LDL 196, goal < 70  Continue statin at discharge  Diabetes type II   HgbA1c pending , goal < 7.0  Other Stroke Risk Factors  Advanced age  Hx stroke/TIA  01/2017 - Left BG/CR patchy small infarcts, likely 2/2 small vessel disease. Plavix. Lipitor 40. HH PT  Obesity, There is no height or weight on file to calculate BMI.   Other Active Problems  Hypothyroidism    Hospital day # 0 She presented with multiple subacute right MCA and PCA distribution infarcts from severe intracranial atherosclerosis.  She remains at significant risk for recurrent strokes and TIAs.  Recommend dual antiplatelet therapy for 3 months followed by Plavix alone and aggressive risk factor modification.  Patient counseled to quit smoking.  Discussed with Dr. Willia CrazeAmrit.  Greater than 50% time during this 35-minute visit was spent on counseling and coordination of care about her strokes and discussion about stroke prevention and treatment and answering questions Jill HeadyPramod Rolondo Pierre, MD To contact Stroke Continuity provider, please refer to [email or web address]. After hours, contact General Neurology

## 2020-01-06 NOTE — ED Notes (Signed)
pts family has been at bedside since arrival to ED

## 2020-01-06 NOTE — Progress Notes (Signed)
PROGRESS NOTE    Jill Rose L Rose  ZOX:[id number] DOB: 12/08/1940 DOA: 01/05/2020 PCP: Ralene OkMoreira, Roy, MD   Brief Narrative:  Patient is a 79 year old female with history of hypertension, diabetes type 2, hypothyroidism, history of TIA who presented with left-sided weakness.  She had weakness more on the left lower extremity than the upper.  It has been ongoing for last 1 week.  She also feels that her speech is slurred.  She had difficulty getting up the stairs due to weakness of the leg.  CT head on presentation showed old left basal ganglia strokes.  MRI of the brain showed acute/subacute nonhemorrhagic right-sided infarct.  Patient admitted for stroke work-up.  Neurology consulted.  Assessment & Plan:   Principal Problem:   Acute ischemic stroke [facility](HCC) Active Problems:   Hypothyroidism   Essential hypertension   Acute CVA (cerebrovascular accident) (HCC)   Acute ischemic stroke: MRI showed acute/subacute nonhemorrhagic infarcts involving the right corona radiata and posterior right temporal and occipital lobes,remote lacunar infarcts of the corona radiata bilaterally, left thalamus, and left paramedian pons. Started stroke work-up.  PT/OT/speech evaluation. LDL of 196.  Hemoglobin A1c is pending. CT angio head and neck has been ordered. Echocardiogram showed left ventricular ejection fraction of 65 to 70%, grade 1 diastolic dysfunction, no atrial level shunt, no cardiac source of emboli Started on aspirin and Plavix. She doesnot have any focal neurological deficits now.  Hypertension: Currently hypertensive.  Allow permissive hypertension.  Gradually normalize blood pressure in 3 to 5 days.  She is on losartan at home.  Continue as needed medications for severe hypertension.  Monitor blood pressure  Hyperlipidemia: LDL of 196.  Started on Lipitor 80 mg daily  Hypothyroidism: Continue Synthyroid  Diabetes type 2: Not on any medications at home.  Will check hemoglobin A1c         DVT prophylaxis:Lovenox Code Status: Full Family Communication: Husband present at the bedside Status is: Inpatient   Dispo: The patient is from: Home              Anticipated d/c is to: SNF vs home              Anticipated d/c date is: 1 day              Patient currently is not medically stable to d/c.    Consultants: Neurology  Procedures:None   Antimicrobials:  Anti-infectives (From admission, onward)   None      Subjective:  Patient seen and examined at the bedside this morning.  Hemodynamically stable.  Comfortable.  Husband was at the bedside.  Denies any weakness at present.  Her speech is clear.  Objective: Vitals:   01/06/20 0345 01/06/20 0400 01/06/20 0600 01/06/20 0700  BP:   (!) 157/60 (!) 179/70  Pulse: 94 95 79 88  Resp: (!) 23 (!) 21 17 17   Temp:      TempSrc:      SpO2: 97% 100% 99% 100%   No intake or output data in the 24 hours ending 01/06/20 1343 There were no vitals filed for this visit.  Examination:  General exam: Appears calm and comfortable ,Not in distress,average built,pleasant elderly female HEENT:PERRL,Oral mucosa moist, Ear/Nose normal on gross exam Respiratory system: Bilateral equal air entry, normal vesicular breath sounds, no wheezes or crackles  Cardiovascular system: S1 & S2 heard, RRR. No JVD, murmurs, rubs, gallops or clicks. No pedal edema. Gastrointestinal system: Abdomen is nondistended, soft and nontender. No organomegaly or masses felt.  Normal bowel sounds heard. Central nervous system: Alert and oriented. No focal neurological deficits. Extremities: No edema, no clubbing ,no cyanosis    Data Reviewed: I have personally reviewed following labs and imaging studies  CBC: Recent Labs  Lab 01/05/20 1401  WBC 11.1*  NEUTROABS 6.5  HGB 13.0  HCT 41.7  MCV 87.4  PLT 258   Basic Metabolic Panel: Recent Labs  Lab 01/05/20 1401 01/06/20 0842  NA 132* 137  K 5.3* 4.3  CL 98 102  CO2 22 24  GLUCOSE 135*  161*  BUN 15 13  CREATININE 0.78 0.72  CALCIUM 9.3 9.1   GFR: CrCl cannot be calculated (Unknown ideal weight.). Liver Function Tests: Recent Labs  Lab 01/05/20 1401  AST 18  ALT 12  ALKPHOS 57  BILITOT 0.3  PROT 8.0  ALBUMIN 3.5   No results for input(s): LIPASE, AMYLASE in the last 168 hours. No results for input(s): AMMONIA in the last 168 hours. Coagulation Profile: Recent Labs  Lab 01/05/20 1401  INR 1.0   Cardiac Enzymes: No results for input(s): CKTOTAL, CKMB, CKMBINDEX, TROPONINI in the last 168 hours. BNP (last 3 results) No results for input(s): PROBNP in the last 8760 hours. HbA1C: No results for input(s): HGBA1C in the last 72 hours. CBG: Recent Labs  Lab 01/05/20 1949 01/06/20 1202  GLUCAP 151* 207*   Lipid Profile: Recent Labs    01/06/20 0235  CHOL 265*  HDL 56  LDLCALC 196*  TRIG 65  CHOLHDL 4.7   Thyroid Function Tests: No results for input(s): TSH, T4TOTAL, FREET4, T3FREE, THYROIDAB in the last 72 hours. Anemia Panel: No results for input(s): VITAMINB12, FOLATE, FERRITIN, TIBC, IRON, RETICCTPCT in the last 72 hours. Sepsis Labs: No results for input(s): PROCALCITON, LATICACIDVEN in the last 168 hours.  Recent Results (from the past 240 hour(s))  SARS Coronavirus 2 by RT PCR (hospital order, performed in [facility] hospital lab) Nasopharyngeal Nasopharyngeal Swab     Status: None   Collection Time: 01/05/20  9:26 PM   Specimen: Nasopharyngeal Swab  Result Value Ref Range Status   SARS Coronavirus 2 NEGATIVE NEGATIVE Final    Comment: (NOTE) SARS-CoV-2 target nucleic acids are NOT DETECTED.  The SARS-CoV-2 RNA is generally detectable in upper and lower respiratory specimens during the acute phase of infection. The lowest concentration of SARS-CoV-2 viral copies this assay can detect is 250 copies / mL. A negative result does not preclude SARS-CoV-2 infection and should not be used as the sole basis for treatment or other patient  management decisions.  A negative result may occur with improper specimen collection / handling, submission of specimen other than nasopharyngeal swab, presence of viral mutation(s) within the areas targeted by this assay, and inadequate number of viral copies (<250 copies / mL). A negative result must be combined with clinical observations, patient history, and epidemiological information.  Fact Sheet for Patients:   BoilerBrush.com.cy  Fact Sheet for Healthcare Providers: https://pope.com/  This test is not yet approved or  cleared by the [place] FDA and has been authorized for detection and/or diagnosis of SARS-CoV-2 by FDA under an Emergency Use Authorization (EUA).  This EUA will remain in effect (meaning this test can be used) for the duration of the COVID-19 declaration under Section 564(b)(1) of the Act, 21 U.S.C. section 360bbb-3(b)(1), unless the authorization is terminated or revoked sooner.  Performed at [facility], [address], [place], [place] [address]  Radiology Studies: DG Chest 2 View  Result Date: 01/05/2020 CLINICAL DATA:  Acute ischemic stroke. EXAM: CHEST - 2 VIEW COMPARISON:  10/17/2013 FINDINGS: Lower lung volumes from prior exam. There are patchy opacities at the left greater than right lung base. Mild cardiomegaly. Aortic atherosclerosis. No significant pleural effusion. There may be minimal fluid in the fissures. No pneumothorax. No acute osseous abnormalities are seen. IMPRESSION: 1. Patchy bibasilar opacities, left greater than right, may be atelectasis or pneumonia. 2. Mild cardiomegaly.  Aortic Atherosclerosis (ICD10-I70.0). Electronically Signed   By: Narda Rutherford M.D.   On: 01/05/2020 22:43   CT HEAD WO CONTRAST  Result Date: 01/05/2020 CLINICAL DATA:  Increased left leg weakness. EXAM: CT HEAD WITHOUT CONTRAST TECHNIQUE: Contiguous axial images were obtained from the base  of the skull through the vertex without intravenous contrast. COMPARISON:  January 26, 2017 FINDINGS: Brain: There is mild cerebral atrophy with widening of the extra-axial spaces and ventricular dilatation. There are areas of decreased attenuation within the white matter tracts of the supratentorial brain, consistent with microvascular disease changes. Small chronic left basal ganglia lacunar infarcts are noted. Vascular: No hyperdense vessel or unexpected calcification. Skull: Normal. Negative for fracture or focal lesion. Sinuses/Orbits: There is mild sphenoid sinus mucosal thickening. Other: None. IMPRESSION: 1. Generalized cerebral atrophy. 2. Small chronic left basal ganglia lacunar infarcts. 3. No acute intracranial abnormality. Electronically Signed   By: Aram Candela M.D.   On: 01/05/2020 15:42   MR ANGIO HEAD WO CONTRAST  Result Date: 01/06/2020 CLINICAL DATA:  Infarcts on MRI brain EXAM: MRA HEAD WITHOUT CONTRAST TECHNIQUE: Angiographic images of the Circle of Willis were obtained using MRA technique without intravenous contrast. COMPARISON:  None. FINDINGS: Motion artifact is present. Intracranial internal carotid arteries are patent with atherosclerotic irregularity and at least moderate stenosis of the supraclinoid portion. Middle and anterior cerebral arteries are patent. Right A1 ACA is congenitally small or stenotic. Proximal left M2 MCA moderate to marked stenosis. Intracranial vertebral arteries, basilar artery, posterior cerebral arteries are patent. Moderate to marked stenosis of the mid basilar. Bilateral PCA atherosclerotic irregularity with right P2 stenosis. Bilateral posterior communicating arteries are present. There is no aneurysm. IMPRESSION: No proximal intracranial vessel occlusion. Multifocal atherosclerotic irregularity and stenoses. Electronically Signed   By: Guadlupe Spanish M.D.   On: 01/06/2020 11:20   MR BRAIN WO CONTRAST  Result Date: 01/06/2020 CLINICAL DATA:   Left-sided weakness over the last week. EXAM: MRI HEAD WITHOUT CONTRAST TECHNIQUE: Multiplanar, multiecho pulse sequences of the brain and surrounding structures were obtained without intravenous contrast. COMPARISON:  CT head without contrast 01/05/2020 FINDINGS: Brain: Arcs the diffusion-weighted images demonstrate acute/subacute nonhemorrhagic infarcts involving the right corona radiata. Additional subcortical white matter infarcts are present in the posterior right temporal and right occipital lobes. Minimal cortical involvement is present. Extensive white matter disease is present bilaterally. Remote lacunar infarcts are present within the corona radiata bilaterally. Remote lacunar infarcts are also present in the left lentiform nucleus. Remote lacunar infarct is present in the left thalamus. Remote white matter infarcts are present adjacent to the atrium of the right lateral ventricle. A remote lacunar infarct is present in the left paramedian pons. White matter changes extend through the brainstem. Remote lacunar infarcts are present in the posteroinferior right cerebellum. The ventricles are proportionate to the degree of atrophy. No significant extraaxial fluid collection is present. The internal auditory canals are within normal limits. Vascular: Flow is present in the major intracranial arteries. Skull and upper cervical  spine: Mild degenerative changes are present in the upper cervical spine. Craniocervical junction is normal. Marrow signal is normal. Sinuses/Orbits: A fluid level is present in the right sphenoid sinus. The paranasal sinuses and mastoid air cells are otherwise clear. Bilateral lens replacements are noted. Globes and orbits are otherwise unremarkable. IMPRESSION: 1. Acute/subacute nonhemorrhagic infarcts involving the right corona radiata and posterior right temporal and occipital lobes. Findings correspond to the subacute time frame of left-sided weakness. 2. Remote lacunar infarcts of  the corona radiata bilaterally, left thalamus, and left paramedian pons. 3. Extensive white matter disease likely reflects the sequela of chronic microvascular ischemia. 4. Right sphenoid sinus disease. Electronically Signed   By: Marin Roberts M.D.   On: 01/06/2020 05:33   ECHOCARDIOGRAM COMPLETE  Result Date: 01/06/2020    ECHOCARDIOGRAM REPORT   Patient Name:   ELLEANNA MELLING Date of Exam: 01/06/2020 Medical Rec #:  [id number]      Height:       61.0 in Accession #:    [id number]     Weight:       174.4 lb Date of Birth:  1941/06/01       BSA:          1.782 m Patient Age:    78 years       BP:           179/70 mmHg Patient Gender: F              HR:           88 bpm. Exam Location:  Inpatient Procedure: 2D Echo Indications:     Stroke 434.91 / I163.9  History:         Patient has prior history of Echocardiogram examinations, most                  recent 01/28/2017. TIA; Risk Factors:Hypertension and Diabetes.                  Thyroid disease.  Sonographer:     Leta Jungling RDCS Referring Phys:  [id number] [place] Diagnosing Phys: Armanda Magic MD  Sonographer Comments: No IV access. IMPRESSIONS  1. There is a mid cavitary resting gradient of due to hyperdynamic LVF.     Marland Kitchen Left ventricular ejection fraction, by estimation, is 65 to 70%. The left ventricle has normal function. The left ventricle has no regional wall motion abnormalities. Left ventricular diastolic parameters are consistent with Grade I diastolic dysfunction (impaired relaxation). Elevated left ventricular end-diastolic pressure.  2. Right ventricular systolic function is normal. The right ventricular size is normal. Tricuspid regurgitation signal is inadequate for assessing PA pressure.  3. The mitral valve is normal in structure. No evidence of mitral valve regurgitation. Mild mitral stenosis. The mean mitral valve gradient is 6.5 mmHg.  4. The aortic valve is tricuspid. Aortic valve regurgitation is not visualized. Mild to  moderate aortic valve sclerosis/calcification is present, without any evidence of aortic stenosis. Aortic valve mean gradient measures 9.0 mmHg. Aortic valve Vmax measures 1.97 m/s.  5. Aortic dilatation noted. 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  6. The inferior vena cava is normal in size with greater than 50% respiratory variability, suggesting right atrial pressure of 3 mmHg. FINDINGS  Left Ventricle: There is a mid cavitary resting gradient of due to hyperdynamic LVF. Left ventricular ejection fraction, by estimation, is 65 to 70%. The left ventricle has normal function. The left  ventricle has no regional wall motion abnormalities. The left ventricular internal cavity size was normal in size. There is no left ventricular hypertrophy. Left ventricular diastolic parameters are consistent with Grade I diastolic dysfunction (impaired relaxation). Elevated left ventricular end-diastolic pressure. Right Ventricle: The right ventricular size is normal. No increase in right ventricular wall thickness. Right ventricular systolic function is normal. Tricuspid regurgitation signal is inadequate for assessing PA pressure. Left Atrium: Left atrial size was normal in size. Right Atrium: Right atrial size was normal in size. Pericardium: There is no evidence of pericardial effusion. Mitral Valve: The mitral valve is normal in structure. Normal mobility of the mitral valve leaflets. Severe mitral annular calcification. No evidence of mitral valve regurgitation. Mild mitral valve stenosis. MV peak gradient, 18.6 mmHg. The mean mitral valve gradient is 6.5 mmHg. Tricuspid Valve: The tricuspid valve is normal in structure. Tricuspid valve regurgitation is not demonstrated. No evidence of tricuspid stenosis. Aortic Valve: The aortic valve is tricuspid. Aortic valve regurgitation is not visualized. Mild to moderate aortic valve sclerosis/calcification is present, without any evidence of aortic  stenosis. Aortic valve mean gradient measures 9.0 mmHg. Aortic valve peak gradient measures 15.5 mmHg. Aortic valve area, by VTI measures 1.06 cm. Pulmonic Valve: The pulmonic valve was normal in structure. Pulmonic valve regurgitation is trivial. No evidence of pulmonic stenosis. Aorta: Aortic dilatation noted. 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 Venous: The inferior vena cava was not well visualized. The inferior vena cava is normal in size with greater than 50% respiratory variability, suggesting right atrial pressure of 3 mmHg. IAS/Shunts: No atrial level shunt detected by color flow Doppler.  LEFT VENTRICLE PLAX 2D LVIDd:         3.80 cm  Diastology LVIDs:         2.60 cm  LV e' lateral:   4.46 cm/s LV PW:         1.00 cm  LV E/e' lateral: 26.2 LV IVS:        1.10 cm  LV e' medial:    4.13 cm/s LVOT diam:     1.60 cm  LV E/e' medial:  28.3 LV SV:         38 LV SV Index:   21 LVOT Area:     2.01 cm  RIGHT VENTRICLE TAPSE (M-mode): 1.6 cm LEFT ATRIUM             Index LA diam:        3.10 cm 1.74 cm/m LA Vol (A2C):   30.1 ml 16.89 ml/m LA Vol (A4C):   29.3 ml 16.44 ml/m LA Biplane Vol: 34.9 ml 19.59 ml/m  AORTIC VALVE AV Area (Vmax):    1.30 cm AV Area (Vmean):   1.34 cm AV Area (VTI):     1.06 cm AV Vmax:           197.00 cm/s AV Vmean:          135.000 cm/s AV VTI:            0.355 m AV Peak Grad:      15.5 mmHg AV Mean Grad:      9.0 mmHg LVOT Vmax:         127.00 cm/s LVOT Vmean:        89.900 cm/s LVOT VTI:          0.187 m LVOT/AV VTI ratio: 0.53  AORTA Ao Root diam: 3.40 cm MITRAL VALVE MV Area (PHT): 2.48 cm  SHUNTS MV Peak grad:  18.6 mmHg    Systemic VTI:  0.19 m MV Mean grad:  6.5 mmHg     Systemic Diam: 1.60 cm MV Vmax:       2.16 m/s MV Vmean:      117.5 cm/s MV Decel Time: 306 msec MV E velocity: 117.00 cm/s MV A velocity: 209.00 cm/s MV E/A ratio:  0.56 Armanda Magic MD Electronically signed by Armanda Magic MD Signature Date/Time: 01/06/2020/8:59:43 AM     Final (Updated)         Scheduled Meds: .  stroke: mapping our early stages of recovery book   Does not apply Once  . aspirin  300 mg Rectal Daily   Or  . aspirin  325 mg Oral Daily  . [START ON 01/07/2020] clopidogrel  75 mg Oral Daily  . enoxaparin (LOVENOX) injection  40 mg Subcutaneous Q24H  . levothyroxine  88 mcg Oral QAC breakfast  . losartan  50 mg Oral QHS  . sodium chloride flush  3 mL Intravenous Once   Continuous Infusions:   LOS: 0 days    Time spent:35 mins. More than 50% of that time was spent in counseling and/or coordination of care.      Burnadette Pop, MD [facility] P6/22/2021, 1:43 PM

## 2020-01-07 ENCOUNTER — Encounter (HOSPITAL_COMMUNITY): Payer: Medicare HMO

## 2020-01-07 DIAGNOSIS — I639 Cerebral infarction, unspecified: Secondary | ICD-10-CM

## 2020-01-07 DIAGNOSIS — E1165 Type 2 diabetes mellitus with hyperglycemia: Secondary | ICD-10-CM

## 2020-01-07 DIAGNOSIS — I10 Essential (primary) hypertension: Secondary | ICD-10-CM

## 2020-01-07 DIAGNOSIS — E039 Hypothyroidism, unspecified: Secondary | ICD-10-CM

## 2020-01-07 DIAGNOSIS — Z8673 Personal history of transient ischemic attack (TIA), and cerebral infarction without residual deficits: Secondary | ICD-10-CM

## 2020-01-07 DIAGNOSIS — D72829 Elevated white blood cell count, unspecified: Secondary | ICD-10-CM

## 2020-01-07 LAB — HEMOGLOBIN A1C
Hgb A1c MFr Bld: 7.6 % — ABNORMAL HIGH (ref 4.8–5.6)
Mean Plasma Glucose: 171 mg/dL

## 2020-01-07 LAB — GLUCOSE, CAPILLARY
Glucose-Capillary: 140 mg/dL — ABNORMAL HIGH (ref 70–99)
Glucose-Capillary: 226 mg/dL — ABNORMAL HIGH (ref 70–99)
Glucose-Capillary: 120 mg/dL — ABNORMAL HIGH (ref 70–99)
Glucose-Capillary: 174 mg/dL — ABNORMAL HIGH (ref 70–99)

## 2020-01-07 MED ORDER — INSULIN ASPART 100 UNIT/ML ~~LOC~~ SOLN
0.0000 [IU] | Freq: Three times a day (TID) | SUBCUTANEOUS | Status: DC
  Administered 2020-01-07: 3 [IU] via SUBCUTANEOUS
  Administered 2020-01-08 (×2): 1 [IU] via SUBCUTANEOUS
  Administered 2020-01-09 (×3): 2 [IU] via SUBCUTANEOUS
  Administered 2020-01-10: 1 [IU] via SUBCUTANEOUS
  Administered 2020-01-10: 2 [IU] via SUBCUTANEOUS
  Administered 2020-01-10: 5 [IU] via SUBCUTANEOUS
  Administered 2020-01-11: 2 [IU] via SUBCUTANEOUS
  Administered 2020-01-11: 3 [IU] via SUBCUTANEOUS
  Administered 2020-01-11: 1 [IU] via SUBCUTANEOUS
  Administered 2020-01-12 (×2): 2 [IU] via SUBCUTANEOUS
  Administered 2020-01-12: 1 [IU] via SUBCUTANEOUS
  Administered 2020-01-13: 2 [IU] via SUBCUTANEOUS
  Administered 2020-01-13: 1 [IU] via SUBCUTANEOUS
  Administered 2020-01-13: 2 [IU] via SUBCUTANEOUS
  Administered 2020-01-14 (×3): 1 [IU] via SUBCUTANEOUS

## 2020-01-07 MED ORDER — INSULIN ASPART 100 UNIT/ML ~~LOC~~ SOLN
0.0000 [IU] | Freq: Every day | SUBCUTANEOUS | Status: DC
  Administered 2020-01-11 – 2020-01-13 (×2): 2 [IU] via SUBCUTANEOUS

## 2020-01-07 NOTE — Social Work (Signed)
CIR started authorization, d/c pt banner stating pt has a legal guardian as no documentation/advanced directives available showing court appointed legal guardian.   Octavio Graves, MSW, LCSW [facility] Clinical Social Work

## 2020-01-07 NOTE — PMR Pre-admission (Addendum)
PMR Admission Coordinator Pre-Admission Assessment  Patient: Jill Rose is an 79 y.o., female MRN: [id number] DOB: 1940/12/24 Height:   Weight:                 Insurance Information HMO:     PPO: yes     PCP:      IPA:      80/20:     OTHER:  PRIMARY: [employer] Medicare      Policy#: [id number]      Subscriber: patient CM Name: Nira Conn      Phone#: [phone]     Fax#: [phone] Pre-Cert#: [id number]      Employer:  Josem Kaufmann provided by Nira Conn for admit to CIR. Pt approved from 6/30 to 7/6 with clinical updates due 7/6 to [place] (p): [phone] (f): [phone] Benefits:  Phone #: online at [email or web address] provider portal, transaction ID [phone]     Name:  Eff. Date: 07/17/2017-07/16/2020     Deduct: does not have      Out of Pocket Max: $5,000 ($14 met)      Life Max: NA  CIR: $295/day co-pay with a max co-pay of $1,770/admission (6 days)      SNF: $0/day co-pay for days 1-20, $184/day co-pay for days 21-100; limited to 100 days Outpatient: $35/visit co-pay; limited by medical necessity     Home Health: 100% coverage, 0% co-insurance; limited by medical necessity     DME: 80% coverage, 20% co-insurance     Providers:  SECONDARY: None     Policy#:       Phone#:   Development worker, community:       Phone#:   The Therapist, art Information Summary" for patients in Inpatient Rehabilitation Facilities with attached "Privacy Act [place] Records" was provided and verbally reviewed with: Patient and Family  Emergency Contact Information Contact Information     Name Relation Home Work Mobile   [place] Spouse [place] Daughter   [phone]      Current Medical History  Patient Admitting Diagnosis: 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  Per chart  review patient lives with spouse.  Two-level home with 3-4 steps to entry reportedly independent prior to admission.  She has a supportive family who plans to hire assist as needed.  Presented January 05, 2020 with left-sided weakness.  CT/MRI showed acute subacute nonhemorrhagic infarction involving the right corona radiata and right posterior temporal and occipital lobes.  Remote lacunar infarct of the corona radiata bilaterally, left thalamus and left paramedian pons.  Patient did not receive TPA.  MRA with no proximal intracranial vessel occlusion.  CT angiogram of head and neck showed severe intracranial atherosclerotic disease without acute large vessel occlusion.  25% diameter stenosis proximal right ICA.  No significant left carotid stenosis.  Echocardiogram with ejection fraction of 70% without wall motion abnormalities.  Admission chemistry sodium 132, potassium 5.3, glucose 135.  Neurology services consulted currently maintained on aspirin 325 mg and Plavix for CVA prophylaxis x3 months then Plavix alone.  Subcutaneous Lovenox for DVT prophylaxis.  Tolerating a regular diet.  Therapy evaluations completed with recommendations for CIR. Patient is to be admitted for a comprehensive rehab program on 01/14/20.   Complete NIHSS TOTAL: 2 Glasgow Coma Scale Score: 15  Past Medical History  Past Medical History:  Diagnosis Date   Diabetes mellitus without  complication [facility])    Essential hypertension    Hypothyroidism    TIA (transient ischemic attack) 2018    Family History  family history includes Heart disease in her mother.  Prior Rehab/Hospitalizations:  Has the patient had prior rehab or hospitalizations prior to admission? No  Has the patient had major surgery during 100 days prior to admission? No  Current Medications   Current Facility-Administered Medications:    acetaminophen (TYLENOL) tablet 650 mg, 650 mg, Oral, Q4H PRN **OR** acetaminophen (TYLENOL) 160 MG/5ML solution 650 mg,  650 mg, Per Tube, Q4H PRN **OR** acetaminophen (TYLENOL) suppository 650 mg, 650 mg, Rectal, Q4H PRN, Alcario Drought, Jared M, DO   amLODipine (NORVASC) tablet 10 mg, 10 mg, Oral, Daily, Adhikari, Amrit, MD, 10 mg at 01/08/20 1109   aspirin suppository 300 mg, 300 mg, Rectal, Daily **OR** aspirin tablet 325 mg, 325 mg, Oral, Daily, Alcario Drought, Jared M, DO, 325 mg at 01/08/20 0825   atorvastatin (LIPITOR) tablet 80 mg, 80 mg, Oral, Daily, Adhikari, Amrit, MD   clopidogrel (PLAVIX) tablet 75 mg, 75 mg, Oral, Daily, Greta Doom, MD, 75 mg at 01/08/20 0825   enoxaparin (LOVENOX) injection 40 mg, 40 mg, Subcutaneous, Q24H, Alcario Drought, Jared M, DO, 40 mg at 01/08/20 0825   fluticasone (FLONASE) 50 MCG/ACT nasal spray 1 spray, 1 spray, Each Nare, Daily, Adhikari, Amrit, MD   guaiFENesin-dextromethorphan (ROBITUSSIN DM) 100-10 MG/5ML syrup 5 mL, 5 mL, Oral, Q4H PRN, Adhikari, Amrit, MD   insulin aspart (novoLOG) injection 0-5 Units, 0-5 Units, Subcutaneous, QHS, Adhikari, Amrit, MD   insulin aspart (novoLOG) injection 0-9 Units, 0-9 Units, Subcutaneous, TID WC, Adhikari, Amrit, MD, 3 Units at 01/07/20 1121   levothyroxine (SYNTHROID) tablet 88 mcg, 88 mcg, Oral, QAC breakfast, Alcario Drought, Jared M, DO, 88 mcg at 01/08/20 1324   losartan (COZAAR) tablet 50 mg, 50 mg, Oral, QHS, Gardner, Jared M, DO, 50 mg at 01/07/20 2306   sodium chloride flush (NS) 0.9 % injection 3 mL, 3 mL, Intravenous, Once, Davonna Belling, MD  Patients Current Diet:  Diet Order             Diet Heart Room service appropriate? Yes; Fluid consistency: Thin  Diet effective now                   Precautions / Restrictions Precautions Precautions: Fall Restrictions Weight Bearing Restrictions: No   Has the patient had 2 or more falls or a fall with injury in the past year?No  Prior Activity Level Limited Community (1-2x/wk): not working, retired; Independent without AD until [facility] Day (then began using RW for ambulation).    Prior Functional Level Prior Function Level of Independence: Independent Comments: daughter reports recent use of AD with mobility  Self Care: Did the patient need help bathing, dressing, using the toilet or eating?  Independent  Indoor Mobility: Did the patient need assistance with walking from room to room (with or without device)? Independent  Stairs: Did the patient need assistance with internal or external stairs (with or without device)? Independent  Functional Cognition: Did the patient need help planning regular tasks such as shopping or remembering to take medications? Independent  Home Assistive Devices / Equipment Home Assistive Devices/Equipment: Gilford Rile (specify type) Home Equipment: None  Prior Device Use: Indicate devices/aids used by the patient prior to current illness, exacerbation or injury?  no AD prior to [facility]; then RW use   Current Functional Level Cognition  Arousal/Alertness: Awake/alert Overall Cognitive Status: Impaired/Different from baseline Orientation  Level: Oriented X4 Safety/Judgement: Decreased awareness of deficits Attention: Focused Focused Attention: Impaired Focused Attention Impairment: Verbal complex, Functional complex Memory: Impaired Memory Impairment: Storage deficit, Decreased short term memory Decreased Short Term Memory: Functional complex Awareness: Appears intact Problem Solving: Impaired Problem Solving Impairment: Verbal complex, Functional complex Executive Function: Reasoning, Sequencing, Organizing Reasoning: Appears intact Sequencing: Impaired Sequencing Impairment: Verbal complex, Functional complex Organizing: Impaired Organizing Impairment: Verbal complex, Functional complex Safety/Judgment: Appears intact    Extremity Assessment (includes Sensation/Coordination)  Upper Extremity Assessment: LUE deficits/detail LUE Deficits / Details: grossly 3+/5 strength, impaired prorioception and fine motor  coordination. Pt reports tactile sensation intact LUE Sensation: decreased proprioception LUE Coordination: decreased fine motor, decreased gross motor (gross motor vs. proprioception vs weakness)  Lower Extremity Assessment: Defer to PT evaluation LLE Deficits / Details: Grossly 3+/5 throughout LLE. Functional weakness noted as LLE would buckle    ADLs  Overall ADL's : Needs assistance/impaired Eating/Feeding: Set up, Sitting Grooming: Set up, Minimal assistance, Sitting Grooming Details (indicate cue type and reason): min A for throughness incorporating LUE Upper Body Bathing: Sitting, Moderate assistance Lower Body Bathing: Moderate assistance, Sit to/from stand Upper Body Dressing : Sitting, Moderate assistance Lower Body Dressing: Moderate assistance, Sit to/from stand Toilet Transfer: Moderate assistance, Stand-pivot, BSC, RW Toilet Transfer Details (indicate cue type and reason): simulated with EOB to recliner positioned on strong side. Extra time and assist to stabilze L knee. Pt unable to clear L foot from ground but can slide LLE a bit with assist  Toileting- Clothing Manipulation and Hygiene: Moderate assistance, Sit to/from stand General ADL Comments: Pt completed bed mobility, sat EOB a few minutes then completed SPT to recliner with mod A and rw utilized. Daughter present.     Mobility  Overal bed mobility: Needs Assistance Bed Mobility: Supine to Sit, Sit to Supine Supine to sit: Mod assist Sit to supine: Mod assist General bed mobility comments: mod A for movement of bilateral LEs off of and back onto bed, assistance needed for trunk support as well    Transfers  Overall transfer level: Needs assistance Equipment used: 1 person hand held assist, Rolling walker (2 wheeled) Transfers: Sit to/from Stand Sit to Stand: Mod assist, Min assist Stand pivot transfers: Mod assist General transfer comment: pt performed multiple times (x10) from EOB; initially with [place] with mod  A and then progressing to min A with use of RW. Initially therapist blocking L knee but no evidence of buckling today    Ambulation / Gait / Stairs / Wheelchair Mobility  Ambulation/Gait General Gait Details: pt able to take a few very small side steps at EOB with mod A and 1HHA. Pt with great difficulty advancing L LE in any direction    Posture / Balance Balance Overall balance assessment: Needs assistance Sitting-balance support: No upper extremity supported, Feet supported Sitting balance-Leahy Scale: Good Standing balance support: Bilateral upper extremity supported, During functional activity Standing balance-Leahy Scale: Poor Standing balance comment: Reliant on UE and external support     Special needs/care consideration Diabetic management : yes  Skin: avulsion to right, left, lower leg, ecchymosis to bilateral arms.   Designated visitor: TBD     Previous Environmental health practitioner (from acute therapy documentation) Living Arrangements: Spouse/significant other  Lives With: Spouse Available Help at Discharge: Family Type of Home: House Home Layout: Two level Alternate Level Stairs-Rails: Left Alternate Level Stairs-Number of Steps: flight Home Access: Stairs to enter Entrance Stairs-Rails: Right, Left Entrance Stairs-Number of Steps: 3-4 Bathroom Shower/Tub: Tub/shower  unit Bathroom Toilet: Standard Home Care Services: No  Discharge Living Setting Plans for Discharge Living Setting: House (lives with husband) Type of Home at Discharge: House Discharge Home Layout: Two level, 1/2 bath on main level, Able to live on main level with bedroom/bathroom (plans to sponge bathe with 1/2 bath on first floor) Alternate Level Stairs-Rails: Left Alternate Level Stairs-Number of Steps: 16 Discharge Home Access: Ramped entrance Discharge Bathroom Shower/Tub: Tub/shower unit Discharge Bathroom Toilet: Standard Discharge Bathroom Accessibility: Yes How Accessible: Accessible via  walker Does the patient have any problems obtaining your medications?: No  Social/Family/Support Systems Patient Roles: Spouse Contact Information: Jynesis Nakamura (spouse): [phone]; *but per pt, daugther Farrel Demark makes decisions (no phone number at this time) Anticipated Caregiver: spouse + hired assistance as needed Equities trader Information: see above Ability/Limitations of Caregiver: Supervision/Min G from the husband (pt willing to hire physical help through care agency) Caregiver Availability: 24/7 Discharge Plan Discussed with Primary Caregiver: No (discussed with pt and her sister; pt wants to talk to husbd) Is Caregiver In Agreement with Plan?:  (yes per daughter) Does Caregiver/Family have Issues with Lodging/Transportation while Pt is in Rehab?: No   Goals Patient/Family Goal for Rehab: PT/OT: Sup/Min A; SLP: Min A/supervision  Expected length of stay: 13-17 days Pt/Family Agrees to Admission and willing to participate: Yes Program Orientation Provided & Reviewed with Pt/Caregiver Including Roles  & Responsibilities: Yes (pt and her sister)  Barriers to Discharge: Home environment access/layout, Lack of/limited family support  Barriers to Discharge Comments: family addressing envionrmental concerns (moving bedroom downstairs, extending half bath to full bath downstairs); family willing to hire assist if needed at [place].    Decrease burden of Care through IP rehab admission: NA   Possible need for SNF placement upon discharge: Not anticipated. Pt does NOT want to go to SNF and is willing to hire physical assist through agencies if she can not reach expected level at DC from [place]. Anticipate pt can reach a supervision level, a level she reports her husband can care for her.    Patient Condition: This patient's medical and functional status has changed since the consult dated: 01/07/20 in which the Rehabilitation Physician determined and documented that the  patient's condition is appropriate for intensive rehabilitative care in an inpatient rehabilitation facility. See "History of Present Illness" (above) for medical update. Functional changes are: improvement in transfers from Mod A to Min/Min A, and pt has now been able to progress to gait training at Mod A level 50 feet with RW; Pt has also been evaluated by OT with recommendations for CIR. Patient's medical and functional status update has been discussed with the Rehabilitation physician and patient remains appropriate for inpatient rehabilitation. Will admit to inpatient rehab today. Since PM&R consult completed by Dr. Delice Lesch on 01/07/20, the patient has been evaluated by OT with recommendation for CIR. Pt is currently Min to Mod A for ADLs. SLP has also evaluated and recommends CIR.   Preadmission Screen Completed By:  Raechel Ache, OT, 01/08/2020 11:12 AM ______________________________________________________________________   Discussed status with Dr. Posey Pronto on 01/14/20 at 4:13PM and received approval for admission today.  Admission Coordinator:  Raechel Ache, time 4:13PM/Date 01/14/20

## 2020-01-07 NOTE — Plan of Care (Signed)
  Problem: Coping: Goal: Will verbalize positive feelings about self Outcome: Progressing   Problem: Nutrition: Goal: Risk of aspiration will decrease Outcome: Progressing   

## 2020-01-07 NOTE — Progress Notes (Signed)
Physical Therapy Treatment Patient Details Name: Jill Rose MRN: [id number] DOB: Jul 16, 1941 Today's Date: 01/07/2020    History of Present Illness Pt is a 79 y/o female admitted secondary to worsening L sided weakness. 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 PMH includes HTN.     PT Comments    Pt making progress towards achieving her current functional mobility goals. Focus of session was on transfers and initiation of gait training. She continues to demonstrate weakness of the L UE/LE and inattention to her L side. She remains an excellent candidate for further intensive therapies at CIR to maximize her independence with functional mobility prior to returning home with family support. PT will continue to follow acutely to progress mobility as tolerated per PT POC.   Follow Up Recommendations  CIR     Equipment Recommendations  Other (comment) (defer to next venue of care)    Recommendations for Other Services       Precautions / Restrictions Precautions Precautions: Fall Restrictions Weight Bearing Restrictions: No    Mobility  Bed Mobility Overal bed mobility: Needs Assistance Bed Mobility: Supine to Sit;Sit to Supine     Supine to sit: Mod assist Sit to supine: Mod assist   General bed mobility comments: mod A for movement of bilateral LEs off of and back onto bed, assistance needed for trunk support as well  Transfers Overall transfer level: Needs assistance Equipment used: 1 person hand held assist;Rolling walker (2 wheeled) Transfers: Sit to/from Stand Sit to Stand: Mod assist;Min assist Stand pivot transfers: Mod assist       General transfer comment: pt performed multiple times (x10) from EOB; initially with [place] with mod A and then progressing to min A with use of RW. Initially therapist blocking L knee but no evidence of buckling today  Ambulation/Gait             General Gait Details: pt able to take a few  very small side steps at EOB with mod A and 1HHA. Pt with great difficulty advancing L LE in any direction   Stairs             Wheelchair Mobility    Modified Rankin (Stroke Patients Only) Modified Rankin (Stroke Patients Only) Pre-Morbid Rankin Score: No symptoms Modified Rankin: Moderately severe disability     Balance Overall balance assessment: Needs assistance Sitting-balance support: No upper extremity supported;Feet supported Sitting balance-Leahy Scale: Good     Standing balance support: Bilateral upper extremity supported;During functional activity Standing balance-Leahy Scale: Poor Standing balance comment: Reliant on UE and external support                             Cognition Arousal/Alertness: Awake/alert Behavior During Therapy: WFL for tasks assessed/performed Overall Cognitive Status: Impaired/Different from baseline Area of Impairment: Memory;Safety/judgement;Problem solving                     Memory: Decreased short-term memory   Safety/Judgement: Decreased awareness of deficits   Problem Solving: Slow processing;Difficulty sequencing;Requires verbal cues;Requires tactile cues        Exercises General Exercises - Lower Extremity Hip Flexion/Marching: AROM;Strengthening;Left;5 reps;Standing Other Exercises Other Exercises: 10x sit<>stand from EOB progressing from mod A with 1HHA to min A with RW Other Exercises: standing lateral weight shifting activity with bilateral UEs on RW and min A for stability    General Comments General comments (skin integrity,  edema, etc.): Pt daughter present throughout session, sister arrived at end of session.       Pertinent Vitals/Pain Pain Assessment: Faces Faces Pain Scale: Hurts little more Pain Location: L ear Pain Descriptors / Indicators: Sore Pain Intervention(s): Monitored during session;Repositioned    Home Living Family/patient expects to be discharged to:: Private  residence Living Arrangements: Spouse/significant other Available Help at Discharge: Family Type of Home: House Home Access: Stairs to enter Entrance Stairs-Rails: Right;Left Home Layout: Two level Home Equipment: None      Prior Function Level of Independence: Independent      Comments: daughter reports recent use of AD with mobility   PT Goals (current goals can now be found in the care plan section) Acute Rehab PT Goals Patient Stated Goal: be able to get up to a toilet PT Goal Formulation: With patient Time For Goal Achievement: 01/20/20 Potential to Achieve Goals: Good Progress towards PT goals: Progressing toward goals    Frequency    Min 5X/week      PT Plan Current plan remains appropriate    Co-evaluation              AM-PAC PT "6 Clicks" Mobility   Outcome Measure  Help needed turning from your back to your side while in a flat bed without using bedrails?: A Little Help needed moving from lying on your back to sitting on the side of a flat bed without using bedrails?: A Lot Help needed moving to and from a bed to a chair (including a wheelchair)?: A Lot Help needed standing up from a chair using your arms (e.g., wheelchair or bedside chair)?: A Lot Help needed to walk in hospital room?: A Lot Help needed climbing 3-5 steps with a railing? : Total 6 Click Score: 12    End of Session Equipment Utilized During Treatment: Gait belt Activity Tolerance: Patient tolerated treatment well Patient left: in bed;with call bell/phone within reach;with bed alarm set;with family/visitor present Nurse Communication: Mobility status PT Visit Diagnosis: Unsteadiness on feet (R26.81);Difficulty in walking, not elsewhere classified (R26.2);Hemiplegia and hemiparesis Hemiplegia - Right/Left: Left Hemiplegia - caused by: Cerebral infarction     Time: 0814-4818 PT Time Calculation (min) (ACUTE ONLY): 33 min  Charges:  $Therapeutic Activity: 23-37 mins                      [name], DPT  Acute Rehabilitation Services Pager [phone] Office [phone]     [name] 01/07/2020, 3:05 PM

## 2020-01-07 NOTE — Progress Notes (Signed)
STROKE TEAM PROGRESS NOTE   INTERVAL HISTORY Patient is sitting up in bed.  Her daughter is at the bedside.  She states she is doing well.  She has no complaints today.  Therapy has evaluated her and recommended inpatient rehab and patient is considering this.  Vital signs stable.  Neuro exam unchanged. Vitals:   01/06/20 2043 01/07/20 0015 01/07/20 0545 01/07/20 1211  BP: (!) 150/65 (!) 169/76 (!) 180/56 (!) 149/59  Pulse: 82 87 75 98  Resp: 18 18 18 16   Temp: 98 F (36.7 C) 98.2 F (36.8 C) 98 F (36.7 C) 97.6 F (36.4 C)  TempSrc: Oral Oral Oral Oral  SpO2: 100% 100% 99% 98%    CBC:  Recent Labs  Lab 01/05/20 1401  WBC 11.1*  NEUTROABS 6.5  HGB 13.0  HCT 41.7  MCV 87.4  PLT 258    Basic Metabolic Panel:  Recent Labs  Lab 01/05/20 1401 01/06/20 0842  NA 132* 137  K 5.3* 4.3  CL 98 102  CO2 22 24  GLUCOSE 135* 161*  BUN 15 13  CREATININE 0.78 0.72  CALCIUM 9.3 9.1   Lipid Panel:     Component Value Date/Time   CHOL 265 (H) 01/06/2020 0235   TRIG 65 01/06/2020 0235   HDL 56 01/06/2020 0235   CHOLHDL 4.7 01/06/2020 0235   VLDL 13 01/06/2020 0235   LDLCALC 196 (H) 01/06/2020 0235   HgbA1c:  Lab Results  Component Value Date   HGBA1C 7.6 (H) 01/06/2020   Urine Drug Screen:     Component Value Date/Time   LABOPIA NONE DETECTED 01/26/2017 1939   COCAINSCRNUR NONE DETECTED 01/26/2017 1939   LABBENZ NONE DETECTED 01/26/2017 1939   AMPHETMU NONE DETECTED 01/26/2017 1939   THCU NONE DETECTED 01/26/2017 1939   LABBARB NONE DETECTED 01/26/2017 1939    Alcohol Level No results found for: ETH  IMAGING past 24 hours No results found.  PHYSICAL EXAM Pleasant elderly lady not in distress. . Afebrile. Head is nontraumatic. Neck is supple without bruit.    Cardiac exam no murmur or gallop. Lungs are clear to auscultation. Distal pulses are well felt. Neurological Exam :  Awake alert oriented x 3 normal speech and language. Mild left lower face asymmetry.  Tongue midline. No drift. Mild diminished fine finger movements on left. Orbits right over left upper extremity. Mild left grip weak.. Normal sensation . Normal coordination.  ASSESSMENT/PLAN Jill Rose is a 79 y.o. female with history of diabetes, hypertension presenting with L sided weakness.   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 Atrophy. Old L basal ganglia lacunes.   MRI  R corona radiata and posterior R temporal and occipital lobe infarcts. Old B corona radiata, L thalamic and L paramedian pontine infarcts. Sinus dz. Extensive white matter disease.  MRA  No proximal occlusion. Multifocal atherosclerosis   CTA head & neck severe intracranial atherosclerosis w/o LVO. Proximal R ICA 25% stenosis. L VA w/ atherosclerosis and multiple stenoses; Severe distal L VA. R VA atherosclerosis w/o stenosis   Carotid Doppler not needed see CTA neck   2D Echo EF 65-70%. No source of embolus   LDL 196  HgbA1c pending   Lovenox 40 mg sq daily for VTE prophylaxis  aspirin 81 mg daily prior to admission, now on aspirin 325 mg daily and clopidogrel 75 mg daily following plavix load. Continue DAPT  x 3 months then plavix alone  Therapy recommendations:  CIR  Disposition:  pending   Hypertension  Stable . Permissive hypertension (OK if < 220/120) but gradually normalize in 5-7 days . Long-term BP goal normotensive  Hyperlipidemia  Home meds:  No statin  On lipitor 80  LDL 196, goal < 70  Continue statin at discharge  Diabetes type II   HgbA1c pending , goal < 7.0  Other Stroke Risk Factors  Advanced age  Hx stroke/TIA  01/2017 - Left BG/CR patchy small infarcts, likely 2/2 small vessel disease. Plavix. Lipitor 40. HH PT  Obesity, There is no height or weight on file to calculate BMI.   Other Active Problems  Hypothyroidism   Hospital day # 1 She presented with multiple  subacute right MCA and PCA distribution infarcts from severe intracranial atherosclerosis.  Continue dual antiplatelet therapy for 3 months followed by Plavix alone and aggressive risk factor modification.  Stroke team will sign off.  Follow-up as an outpatient stroke clinic in 6 weeks.  Discussed with Dr. Tamsen Meek.  Greater than 50% time during this 25-minute visit was spent on counseling and coordination of care about her strokes and discussion about stroke prevention and treatment and answering questions Antony Contras, MD To contact Stroke Continuity provider, please refer to [email or web address] After hours, contact General Neurology

## 2020-01-07 NOTE — Progress Notes (Signed)
Inpatient Rehabilitation-Admissions Coordinator   Met with pt and her sister as follow up from PM&R consult (please see consult note by Dr. Delice Lesch on 6/23 for details). Reviewed recommended rehab program, including expectations, anticipated LOS, and expected functional outcomes. The patient appears interested in CIR if insurance approves. Confirmed DC support through pt and her sister and they stated plans to hire additional assistance at [facility] if needed. Left brochures in pt's room as the patient would like to discuss the program with her husband and daughter this evening. Will follow up once there has been an Medical illustrator.   Raechel Ache, OTR/L  Rehab Admissions Coordinator  [phone] 01/07/2020 4:20 PM

## 2020-01-07 NOTE — Consult Note (Signed)
Physical Medicine and Rehabilitation Consult Reason for Consult: Left side weakness Referring Physician: [facility]  HPI: Jill Rose is a 79 y.o. right-handed female with history of diabetes mellitus, hypertension, hypothyroidism, TIA.  History taken from chart review and sister due to?  Mentation/language.  Patient lives with spouse.  Two-level home 3-4 steps to entry independent prior to admission.  She presented on 01/05/2020 with left hemiparesis.  CT/MRI showed acute subacute nonhemorrhagic infarct involving the right corona radiata and right posterior temporal and occipital lobes.  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  Patient did not receive TPA.  MRA with no proximal intracranial vessel occlusion.  CT angiogram of head and neck severe intracranial atherosclerotic disease without acute large vessel occlusion.  25% diameter stenosis proximal right ICA. No significant left carotid stenosis.  Echocardiogram with ejection fraction of 70% without wall motion abnormalities.  Admission chemistry sodium 132, potassium 5.3 glucose 135.  Neurology follow-up currently maintained on aspirin and Plavix for CVA prophylaxis.  Subcutaneous Lovenox for DVT prophylaxis.  Tolerating a regular diet.  Therapy evaluations completed with recommendations of physical medicine rehab consult.  Review of Systems  Constitutional: Positive for malaise/fatigue. Negative for chills and fever.  HENT: Negative for hearing loss.   Eyes: Negative for blurred vision and double vision.  Respiratory: Negative for cough and shortness of breath.   Cardiovascular: Negative for chest pain and palpitations.  Gastrointestinal: Positive for constipation. Negative for heartburn, nausea and vomiting.  Genitourinary: Negative for dysuria, flank pain and hematuria.  Musculoskeletal: Positive for myalgias.  Skin: Negative for rash.  Neurological: Positive for focal  weakness and weakness.  All other systems reviewed and are negative.  Past Medical History:  Diagnosis Date  . Diabetes mellitus without complication ([place])   . Essential hypertension   . Hypothyroidism   . TIA (transient ischemic attack) 2018   History reviewed. No pertinent surgical history. Family History  Problem Relation Age of Onset  . Heart disease Mother    Social History:  reports that she has never smoked. She has never used smokeless tobacco. She reports that she does not drink alcohol and does not use drugs. Allergies:  Allergies  Allergen Reactions  . Ammonia Shortness Of Breath, Itching and Swelling  . Peroxide [Hydrogen Peroxide] Shortness Of Breath, Itching and Swelling   Medications Prior to Admission  Medication Sig Dispense Refill  . aspirin EC 81 MG tablet Take 162 mg by mouth daily before supper. Swallow whole.    . b complex vitamins tablet Take 1 tablet by mouth daily with lunch.    Jolyne Loa Grape-Goldenseal (BERBERINE COMPLEX PO) Take 1 tablet by mouth daily with lunch.    . Cholecalciferol (VITAMIN D3 PO) Take 1 tablet by mouth daily with lunch.     . Cyanocobalamin (VITAMIN B-12 PO) Take 1 tablet by mouth daily with lunch.    . levothyroxine (SYNTHROID) 88 MCG tablet Take 88 mcg by mouth daily before breakfast.    . losartan (COZAAR) 50 MG tablet Take 50 mg by mouth at bedtime.    . Menaquinone-7 (VITAMIN K2 PO) Take 1 tablet by mouth daily with lunch.      Home: Home Living Family/patient expects to be discharged to:: Private residence Living Arrangements: Spouse/significant other Available Help at Discharge: Family Type of Home: House Home Access: Stairs to enter [employer] of Steps: 3-4 Entrance Stairs-Rails: Right, Left Home Layout: Two level Alternate Level Stairs-Number  of Steps: flight Alternate Level Stairs-Rails: Left Home Equipment: None  Functional History: Prior Function Level of Independence:  Independent Functional Status:  Mobility: Bed Mobility Overal bed mobility: Needs Assistance Bed Mobility: Supine to Sit, Sit to Supine Supine to sit: Mod assist Sit to supine: Mod assist General bed mobility comments: Mod A for trunk assist and LE assist. Required assist to scoot hips to EOB as well. Increased time required.  Transfers Overall transfer level: Needs assistance Equipment used: 1 person hand held assist Transfers: Sit to/from Stand Sit to Stand: Mod assist General transfer comment: PT stood in front of pt and had pt hold to PT arms. Pt requiring mod A for lift assist and steadying to stand and required manual blocking of L knee. Pt unable to take steps at EOB this session.       ADL:    Cognition: Cognition Overall Cognitive Status: Within Functional Limits for tasks assessed Orientation Level: Oriented X4 Cognition Arousal/Alertness: Awake/alert Behavior During Therapy: WFL for tasks assessed/performed Overall Cognitive Status: Within Functional Limits for tasks assessed  Blood pressure (!) 169/76, pulse 75, temperature 98 F (36.7 C), temperature source Oral, resp. rate 18, SpO2 99 %. Physical Exam  Vitals reviewed. Constitutional: No distress.  HENT:  Head: Normocephalic and atraumatic.  Right Ear: External ear normal.  Left Ear: External ear normal.  Nose: Nose normal.  Eyes: Right eye exhibits no discharge. Left eye exhibits no discharge.  Respiratory: Effort normal. No stridor. No respiratory distress.  GI: She exhibits no distension.  Musculoskeletal:     Cervical back: Normal range of motion.     Comments: Lower extremity edema  Neurological: She is alert.  Alert  Makes eye contact with examiner and follows simple commands.   Provides name and age with some delay in processing. Patient weakness Motor: Right upper extremities: 4+-5/5 proximal distal Left upper extremity: 4+/5 proximal distal Bilateral lower extremities:?  Limited due to  language versus positioning - hip flexion, knee extension 3 -/5, ankle dorsiflexion, +/5  Skin: Skin is warm and dry.  Psychiatric:  ?  Altered mentation/slowed,?  Secondary to language-sister reports no cognitive issues    Results for orders placed or performed during the hospital encounter of 01/05/20 (from the past 24 hour(s))  Basic metabolic panel     Status: Abnormal   Collection Time: 01/06/20  8:42 AM  Result Value Ref Range   Sodium 137 135 - 145 mmol/L   Potassium 4.3 3.5 - 5.1 mmol/L   Chloride 102 98 - 111 mmol/L   CO2 24 22 - 32 mmol/L   Glucose, Bld 161 (H) 70 - 99 mg/dL   BUN 13 8 - 23 mg/dL   Creatinine, Ser 9.57 0.44 - 1.00 mg/dL   Calcium 9.1 8.9 - 47.3 mg/dL   GFR calc non Af Amer >60 >60 mL/min   GFR calc Af Amer >60 >60 mL/min   Anion gap 11 5 - 15  CBG monitoring, ED     Status: Abnormal   Collection Time: 01/06/20 12:02 PM  Result Value Ref Range   Glucose-Capillary 207 (H) 70 - 99 mg/dL  Glucose, capillary     Status: Abnormal   Collection Time: 01/06/20  5:03 PM  Result Value Ref Range   Glucose-Capillary 152 (H) 70 - 99 mg/dL  Glucose, capillary     Status: Abnormal   Collection Time: 01/06/20  9:28 PM  Result Value Ref Range   Glucose-Capillary 176 (H) 70 - 99 mg/dL  Glucose,  capillary     Status: Abnormal   Collection Time: 01/07/20  6:03 AM  Result Value Ref Range   Glucose-Capillary 140 (H) 70 - 99 mg/dL   CT ANGIO HEAD W OR WO CONTRAST  Result Date: 01/06/2020 CLINICAL DATA:  Altered mental status. Stroke. Left-sided weakness. EXAM: CT ANGIOGRAPHY HEAD AND NECK TECHNIQUE: Multidetector CT imaging of the head and neck was performed using the standard protocol during bolus administration of intravenous contrast. Multiplanar CT image reconstructions and MIPs were obtained to evaluate the vascular anatomy. Carotid stenosis measurements (when applicable) are obtained utilizing NASCET criteria, using the distal internal carotid diameter as the  denominator. CONTRAST:  19mL OMNIPAQUE IOHEXOL 350 MG/ML SOLN COMPARISON:  CT head 01/05/2020.  MRI head and MRA head 01/06/2020 FINDINGS: CTA NECK FINDINGS Aortic arch: 2 vessel arch with bovine branching pattern. Mild atherosclerotic disease in the aortic arch. Proximal great vessels widely patent. Right carotid system: Right common carotid artery widely patent. Medial deviation of the right carotid bifurcation posterior to the pharynx. Atherosclerotic calcification in the proximal right internal carotid artery with 25% diameter stenosis. Left carotid system: Left carotid artery widely patent without stenosis. Mild atherosclerotic disease at the bifurcation. Medial deviation of the carotid bifurcation in the retropharyngeal space. Vertebral arteries: Right vertebral artery is patent to the basilar with mild scattered atherosclerotic disease Non dominant left vertebral artery with diffuse atherosclerotic disease. There are multiple areas of stenosis in the proximal and mid and distal left vertebral artery which is patent to the basilar. There is moderate to severe stenosis in the distal left vertebral artery at the skull base and in the left V3 and V4 segments. Skeleton: Cervical spondylosis and kyphosis. No acute skeletal abnormality. Other neck: Negative for mass or adenopathy in the neck. Upper chest: Lung apices clear bilaterally. Review of the MIP images confirms the above findings CTA HEAD FINDINGS Anterior circulation: Atherosclerotic calcification in the cavernous carotid bilaterally. Severe stenosis right supraclinoid internal carotid artery and mild to moderate stenosis right cavernous carotid. Left internal carotid artery is patent without stenosis. Hypoplastic right A1 segment. Anterior and middle cerebral arteries are patent bilaterally. Severe stenosis of the superior division of left M1 at the origin. No other significant stenosis in the anterior circulation. Posterior circulation: Severe stenosis in  the distal left vertebral artery. Small basilar artery with severe stenosis in the midportion. Fetal origin of the posterior cerebral artery bilaterally. Moderate stenosis of the proximal and mid right posterior cerebral artery. Venous sinuses: Limited venous contrast due to arterial phase scanning. Anatomic variants: None Review of the MIP images confirms the above findings IMPRESSION: 1. Severe intracranial atherosclerotic disease without acute large vessel occlusion. 2. 25% diameter stenosis proximal right internal carotid artery. No significant left carotid stenosis 3. Non dominant left vertebral artery with diffuse atherosclerotic disease and multiple areas of stenosis. Severe stenosis distal left vertebral artery. Right vertebral artery is disease without significant stenosis. Electronically Signed   By: Marlan Palau M.D.   On: 01/06/2020 14:31   DG Chest 2 View  Result Date: 01/05/2020 CLINICAL DATA:  Acute ischemic stroke. EXAM: CHEST - 2 VIEW COMPARISON:  10/17/2013 FINDINGS: Lower lung volumes from prior exam. There are patchy opacities at the left greater than right lung base. Mild cardiomegaly. Aortic atherosclerosis. No significant pleural effusion. There may be minimal fluid in the fissures. No pneumothorax. No acute osseous abnormalities are seen. IMPRESSION: 1. Patchy bibasilar opacities, left greater than right, may be atelectasis or pneumonia. 2. Mild cardiomegaly.  Aortic Atherosclerosis (ICD10-I70.0). Electronically Signed   By: Narda Rutherford M.D.   On: 01/05/2020 22:43   CT HEAD WO CONTRAST  Result Date: 01/05/2020 CLINICAL DATA:  Increased left leg weakness. EXAM: CT HEAD WITHOUT CONTRAST TECHNIQUE: Contiguous axial images were obtained from the base of the skull through the vertex without intravenous contrast. COMPARISON:  January 26, 2017 FINDINGS: Brain: There is mild cerebral atrophy with widening of the extra-axial spaces and ventricular dilatation. There are areas of decreased  attenuation within the white matter tracts of the supratentorial brain, consistent with microvascular disease changes. Small chronic left basal ganglia lacunar infarcts are noted. Vascular: No hyperdense vessel or unexpected calcification. Skull: Normal. Negative for fracture or focal lesion. Sinuses/Orbits: There is mild sphenoid sinus mucosal thickening. Other: None. IMPRESSION: 1. Generalized cerebral atrophy. 2. Small chronic left basal ganglia lacunar infarcts. 3. No acute intracranial abnormality. Electronically Signed   By: Aram Candela M.D.   On: 01/05/2020 15:42   CT ANGIO NECK W OR WO CONTRAST  Result Date: 01/06/2020 CLINICAL DATA:  Altered mental status. Stroke. Left-sided weakness. EXAM: CT ANGIOGRAPHY HEAD AND NECK TECHNIQUE: Multidetector CT imaging of the head and neck was performed using the standard protocol during bolus administration of intravenous contrast. Multiplanar CT image reconstructions and MIPs were obtained to evaluate the vascular anatomy. Carotid stenosis measurements (when applicable) are obtained utilizing NASCET criteria, using the distal internal carotid diameter as the denominator. CONTRAST:  80mL OMNIPAQUE IOHEXOL 350 MG/ML SOLN COMPARISON:  CT head 01/05/2020.  MRI head and MRA head 01/06/2020 FINDINGS: CTA NECK FINDINGS Aortic arch: 2 vessel arch with bovine branching pattern. Mild atherosclerotic disease in the aortic arch. Proximal great vessels widely patent. Right carotid system: Right common carotid artery widely patent. Medial deviation of the right carotid bifurcation posterior to the pharynx. Atherosclerotic calcification in the proximal right internal carotid artery with 25% diameter stenosis. Left carotid system: Left carotid artery widely patent without stenosis. Mild atherosclerotic disease at the bifurcation. Medial deviation of the carotid bifurcation in the retropharyngeal space. Vertebral arteries: Right vertebral artery is patent to the basilar with  mild scattered atherosclerotic disease Non dominant left vertebral artery with diffuse atherosclerotic disease. There are multiple areas of stenosis in the proximal and mid and distal left vertebral artery which is patent to the basilar. There is moderate to severe stenosis in the distal left vertebral artery at the skull base and in the left V3 and V4 segments. Skeleton: Cervical spondylosis and kyphosis. No acute skeletal abnormality. Other neck: Negative for mass or adenopathy in the neck. Upper chest: Lung apices clear bilaterally. Review of the MIP images confirms the above findings CTA HEAD FINDINGS Anterior circulation: Atherosclerotic calcification in the cavernous carotid bilaterally. Severe stenosis right supraclinoid internal carotid artery and mild to moderate stenosis right cavernous carotid. Left internal carotid artery is patent without stenosis. Hypoplastic right A1 segment. Anterior and middle cerebral arteries are patent bilaterally. Severe stenosis of the superior division of left M1 at the origin. No other significant stenosis in the anterior circulation. Posterior circulation: Severe stenosis in the distal left vertebral artery. Small basilar artery with severe stenosis in the midportion. Fetal origin of the posterior cerebral artery bilaterally. Moderate stenosis of the proximal and mid right posterior cerebral artery. Venous sinuses: Limited venous contrast due to arterial phase scanning. Anatomic variants: None Review of the MIP images confirms the above findings IMPRESSION: 1. Severe intracranial atherosclerotic disease without acute large vessel occlusion. 2. 25% diameter stenosis proximal right internal  carotid artery. No significant left carotid stenosis 3. Non dominant left vertebral artery with diffuse atherosclerotic disease and multiple areas of stenosis. Severe stenosis distal left vertebral artery. Right vertebral artery is disease without significant stenosis. Electronically Signed    By: Marlan Palau M.D.   On: 01/06/2020 14:31   MR ANGIO HEAD WO CONTRAST  Result Date: 01/06/2020 CLINICAL DATA:  Infarcts on MRI brain EXAM: MRA HEAD WITHOUT CONTRAST TECHNIQUE: Angiographic images of the Circle of Willis were obtained using MRA technique without intravenous contrast. COMPARISON:  None. FINDINGS: Motion artifact is present. Intracranial internal carotid arteries are patent with atherosclerotic irregularity and at least moderate stenosis of the supraclinoid portion. Middle and anterior cerebral arteries are patent. Right A1 ACA is congenitally small or stenotic. Proximal left M2 MCA moderate to marked stenosis. Intracranial vertebral arteries, basilar artery, posterior cerebral arteries are patent. Moderate to marked stenosis of the mid basilar. Bilateral PCA atherosclerotic irregularity with right P2 stenosis. Bilateral posterior communicating arteries are present. There is no aneurysm. IMPRESSION: No proximal intracranial vessel occlusion. Multifocal atherosclerotic irregularity and stenoses. Electronically Signed   By: Guadlupe Spanish M.D.   On: 01/06/2020 11:20   MR BRAIN WO CONTRAST  Result Date: 01/06/2020 CLINICAL DATA:  Left-sided weakness over the last week. EXAM: MRI HEAD WITHOUT CONTRAST TECHNIQUE: Multiplanar, multiecho pulse sequences of the brain and surrounding structures were obtained without intravenous contrast. COMPARISON:  CT head without contrast 01/05/2020 FINDINGS: Brain: Arcs the diffusion-weighted images demonstrate acute/subacute nonhemorrhagic infarcts involving the right corona radiata. Additional subcortical white matter infarcts are present in the posterior right temporal and right occipital lobes. Minimal cortical involvement is present. Extensive white matter disease is present bilaterally. Remote lacunar infarcts are present within the corona radiata bilaterally. Remote lacunar infarcts are also present in the left lentiform nucleus. Remote lacunar infarct  is present in the left thalamus. Remote white matter infarcts are present adjacent to the atrium of the right lateral ventricle. A remote lacunar infarct is present in the left paramedian pons. White matter changes extend through the brainstem. Remote lacunar infarcts are present in the posteroinferior right cerebellum. The ventricles are proportionate to the degree of atrophy. No significant extraaxial fluid collection is present. The internal auditory canals are within normal limits. Vascular: Flow is present in the major intracranial arteries. Skull and upper cervical spine: Mild degenerative changes are present in the upper cervical spine. Craniocervical junction is normal. Marrow signal is normal. Sinuses/Orbits: A fluid level is present in the right sphenoid sinus. The paranasal sinuses and mastoid air cells are otherwise clear. Bilateral lens replacements are noted. Globes and orbits are otherwise unremarkable. IMPRESSION: 1. Acute/subacute nonhemorrhagic infarcts involving the right corona radiata and posterior right temporal and occipital lobes. Findings correspond to the subacute time frame of left-sided weakness. 2. Remote lacunar infarcts of the corona radiata bilaterally, left thalamus, and left paramedian pons. 3. Extensive white matter disease likely reflects the sequela of chronic microvascular ischemia. 4. Right sphenoid sinus disease. Electronically Signed   By: Marin Roberts M.D.   On: 01/06/2020 05:33   ECHOCARDIOGRAM COMPLETE  Result Date: 01/06/2020    ECHOCARDIOGRAM REPORT   Patient Name:   Jill Rose Date of Exam: 01/06/2020 Medical Rec #:  [id number]      Height:       61.0 in Accession #:    [id number]     Weight:       174.4 lb Date of Birth:  1940/10/17  BSA:          1.782 m Patient Age:    78 years       BP:           179/70 mmHg Patient Gender: F              HR:           88 bpm. Exam Location:  Inpatient Procedure: 2D Echo Indications:     Stroke 434.91 / I163.9   History:         Patient has prior history of Echocardiogram examinations, most                  recent 01/28/2017. TIA; Risk Factors:Hypertension and Diabetes.                  Thyroid disease.  Sonographer:     Leta Junglingiffany Cooper RDCS Referring Phys:  [id number] Heywood IlesJARED M GARDNER Diagnosing Phys: Armanda Magicraci Turner MD  Sonographer Comments: No IV access. IMPRESSIONS  1. There is a mid cavitary resting gradient of 21mmHg due to hyperdynamic LVF.     Marland Kitchen. Left ventricular ejection fraction, by estimation, is 65 to 70%. The left ventricle has normal function. The left ventricle has no regional wall motion abnormalities. Left ventricular diastolic parameters are consistent with Grade I diastolic dysfunction (impaired relaxation). Elevated left ventricular end-diastolic pressure.  2. Right ventricular systolic function is normal. The right ventricular size is normal. Tricuspid regurgitation signal is inadequate for assessing PA pressure.  3. The mitral valve is normal in structure. No evidence of mitral valve regurgitation. Mild mitral stenosis. The mean mitral valve gradient is 6.5 mmHg.  4. The aortic valve is tricuspid. Aortic valve regurgitation is not visualized. Mild to moderate aortic valve sclerosis/calcification is present, without any evidence of aortic stenosis. Aortic valve mean gradient measures 9.0 mmHg. Aortic valve Vmax measures 1.97 m/s.  5. Aortic dilatation noted. 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  6. The inferior vena cava is normal in size with greater than 50% respiratory variability, suggesting right atrial pressure of 3 mmHg. FINDINGS  Left Ventricle: There is a mid cavitary resting gradient of 21mmHg due to hyperdynamic LVF. Left ventricular ejection fraction, by estimation, is 65 to 70%. The left ventricle has normal function. The left ventricle has no regional wall motion abnormalities. The left ventricular internal cavity size was normal in size. There is no left ventricular  hypertrophy. Left ventricular diastolic parameters are consistent with Grade I diastolic dysfunction (impaired relaxation). Elevated left ventricular end-diastolic pressure. Right Ventricle: The right ventricular size is normal. No increase in right ventricular wall thickness. Right ventricular systolic function is normal. Tricuspid regurgitation signal is inadequate for assessing PA pressure. Left Atrium: Left atrial size was normal in size. Right Atrium: Right atrial size was normal in size. Pericardium: There is no evidence of pericardial effusion. Mitral Valve: The mitral valve is normal in structure. Normal mobility of the mitral valve leaflets. Severe mitral annular calcification. No evidence of mitral valve regurgitation. Mild mitral valve stenosis. MV peak gradient, 18.6 mmHg. The mean mitral valve gradient is 6.5 mmHg. Tricuspid Valve: The tricuspid valve is normal in structure. Tricuspid valve regurgitation is not demonstrated. No evidence of tricuspid stenosis. Aortic Valve: The aortic valve is tricuspid. Aortic valve regurgitation is not visualized. Mild to moderate aortic valve sclerosis/calcification is present, without any evidence of aortic stenosis. Aortic valve mean gradient measures 9.0 mmHg. Aortic valve peak gradient measures 15.5  mmHg. Aortic valve area, by VTI measures 1.06 cm. Pulmonic Valve: The pulmonic valve was normal in structure. Pulmonic valve regurgitation is trivial. No evidence of pulmonic stenosis. Aorta: Aortic dilatation noted. 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 Venous: The inferior vena cava was not well visualized. The inferior vena cava is normal in size with greater than 50% respiratory variability, suggesting right atrial pressure of 3 mmHg. IAS/Shunts: No atrial level shunt detected by color flow Doppler.  LEFT VENTRICLE PLAX 2D LVIDd:         3.80 cm  Diastology LVIDs:         2.60 cm  LV e' lateral:   4.46 cm/s LV PW:         1.00 cm  LV  E/e' lateral: 26.2 LV IVS:        1.10 cm  LV e' medial:    4.13 cm/s LVOT diam:     1.60 cm  LV E/e' medial:  28.3 LV SV:         38 LV SV Index:   21 LVOT Area:     2.01 cm  RIGHT VENTRICLE TAPSE (M-mode): 1.6 cm LEFT ATRIUM             Index LA diam:        3.10 cm 1.74 cm/m LA Vol (A2C):   30.1 ml 16.89 ml/m LA Vol (A4C):   29.3 ml 16.44 ml/m LA Biplane Vol: 34.9 ml 19.59 ml/m  AORTIC VALVE AV Area (Vmax):    1.30 cm AV Area (Vmean):   1.34 cm AV Area (VTI):     1.06 cm AV Vmax:           197.00 cm/s AV Vmean:          135.000 cm/s AV VTI:            0.355 m AV Peak Grad:      15.5 mmHg AV Mean Grad:      9.0 mmHg LVOT Vmax:         127.00 cm/s LVOT Vmean:        89.900 cm/s LVOT VTI:          0.187 m LVOT/AV VTI ratio: 0.53  AORTA Ao Root diam: 3.40 cm MITRAL VALVE MV Area (PHT): 2.48 cm     SHUNTS MV Peak grad:  18.6 mmHg    Systemic VTI:  0.19 m MV Mean grad:  6.5 mmHg     Systemic Diam: 1.60 cm MV Vmax:       2.16 m/s MV Vmean:      117.5 cm/s MV Decel Time: 306 msec MV E velocity: 117.00 cm/s MV A velocity: 209.00 cm/s MV E/A ratio:  0.56 Armanda Magic MD Electronically signed by Armanda Magic MD Signature Date/Time: 01/06/2020/8:59:43 AM    Final (Updated)    Assessment/Plan: Diagnosis: 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  Records reviewed and summated above.  1. Does the need for close, 24 hr/day medical supervision in concert with the patient's rehab needs make it unreasonable for this patient to be served  in a less intensive setting?  Yes 2. Co-Morbidities requiring supervision/potential complications: DM with hyperglycemia (Monitor in accordance with exercise and adjust meds as necessary), HTN (monitor and provide prns in accordance with increased physical exertion and pain), hypothyroidism (cont meds, ensure appropriate mood and energy level for therapies), history of TIA, leukocytosis (repeat labs, cont to monitor for signs and symptoms of infection, further workup if indicated), morbid obesity (encourage weight loss) 3. Due to bladder management, bowel management, safety, disease management, medication administration and patient education, does the patient require 24 hr/day rehab nursing? Yes 4. Does the patient require coordinated care of a physician, rehab nurse, therapy disciplines of PT/OT/SLP to address physical and functional deficits in the context of the above medical diagnosis(es)? Yes Addressing deficits in the following areas: balance, endurance, locomotion, strength, transferring, bathing, dressing, toileting, cognition and psychosocial support 5. Can the patient actively participate in an intensive therapy program of at least 3 hrs of therapy per day at least 5 days per week? Yes 6. The potential for patient to make measurable gains while on inpatient rehab is excellent 7. Anticipated functional outcomes upon discharge from inpatient rehab are supervision and min assist  with PT, supervision and min assist with OT, modified independent and supervision with SLP. 8. Estimated rehab length of stay to reach the above functional goals is: 12-16 days. 9. Anticipated discharge destination: Home 10. Overall Rehab/Functional Prognosis: good  RECOMMENDATIONS: This patient's condition is appropriate for continued rehabilitative care in the following setting: CIR Patient has agreed to participate in recommended program. Potentially Note that insurance prior authorization may be required for reimbursement for recommended  care.  Comment: Rehab Admissions Coordinator to follow up.  I have personally performed a face to face diagnostic evaluation, including, but not limited to relevant history and physical exam findings, of this patient and developed relevant assessment and plan.  Additionally, I have reviewed and concur with the physician assistant's documentation above.   Maryla Morrow, MD, ABPMR Mcarthur Rossetti Angiulli, PA-C 01/07/2020

## 2020-01-07 NOTE — Progress Notes (Signed)
PROGRESS NOTE    Jill Rose  HKV:[id number] DOB: 07-04-1941 DOA: 01/05/2020 PCP: Jilda Panda, MD   Brief Narrative:  Patient is a 79 year old female with history of hypertension, diabetes type 2, hypothyroidism, history of TIA who presented with left-sided weakness.  She had weakness more on the left lower extremity than the upper.  It has been ongoing for last 1 week.  She also feels that her speech is slurred.  She had difficulty getting up the stairs due to weakness of the leg.  CT head on presentation showed old left basal ganglia strokes.  MRI of the brain showed acute/subacute nonhemorrhagic right-sided infarct.  Patient admitted for stroke work-up.  Neurology consulted.  Physical therapy recommended inpatient rehab.  Assessment & Plan:   Principal Problem:   Acute ischemic stroke [facility]) Active Problems:   Hypothyroidism   Essential hypertension   Acute CVA (cerebrovascular accident) ([place])   Acute ischemic stroke: MRI showed acute/subacute nonhemorrhagic infarcts involving the right corona radiata and posterior right temporal and occipital lobes,remote lacunar infarcts of the corona radiata bilaterally, left thalamus, and left paramedian pons. MRA showed multifocal atherosclerosis no proximal occlusion.  CT head and neck showed severe intracranial atherosclerosis without large vessel occlusion.  Proximal ICA has 25% stenosis. PT/OT recommending CIR. LDL of 196.  Hemoglobin A1c is 7.6 CT angio head and neck showed severe intracranial atherosclerotic disease without acute large vessel occlusion,25% diameter stenosis proximal right internal carotid artery. No significant left carotid stenosis,non dominant left vertebral artery with diffuse atherosclerotic disease and multiple areas of stenosis. Severe stenosis distal left vertebral artery. Echocardiogram showed left ventricular ejection fraction of 65 to 56%, grade 1 diastolic dysfunction, no atrial level shunt, no cardiac source of  emboli Started on aspirin and Plavix.  Neurology recommending aspirin 325 mg daily and Plavix for 3 months then Plavix alone.  Started on Lipitor She doesnot have any focal neurological deficits now.  Hypertension: Currently hypertensive.  Allow permissive hypertension.  Gradually normalize blood pressure in 3 to 5 days.  She is on losartan at home.  Continue as needed medications for severe hypertension.  Monitor blood pressure  Hyperlipidemia: LDL of 196.  Started on Lipitor 80 mg daily  Hypothyroidism: Continue Synthyroid  Diabetes type 2: Not on any medications at home.  Hemoglobin A1c was 7.6.  Continue sliding-scale insulin here.  We will put her on Metformin on discharge.        DVT prophylaxis:Lovenox Code Status: Full Family Communication:Daughter  present at the bedside Status is: Inpatient   Dispo: The patient is from: Home              Anticipated d/c is to: CIR              Anticipated d/c date is: 2-3 day Patient is medically stable for DC as soon as bed is available.                  Consultants: Neurology  Procedures:None   Antimicrobials:  Anti-infectives (From admission, onward)   None      Subjective:  Patient seen and examined at the bedside this morning.  Hemodynamically stable.  Blood pressure has improved today.  Denies any new complaints.  Daughter was at the bedside.  Objective: Vitals:   01/06/20 1710 01/06/20 2043 01/07/20 0015 01/07/20 0545  BP: (!) 155/79 (!) 150/65 (!) 169/76   Pulse: 88 82 87 75  Resp: 18 18 18 18   Temp: 98.4 F (36.9 C) 98 F (  36.7 C) 98.2 F (36.8 C) 98 F (36.7 C)  TempSrc: Oral Oral Oral Oral  SpO2: 100% 100% 100% 99%    Intake/Output Summary (Last 24 hours) at 01/07/2020 0851 Last data filed at 01/07/2020 0550 Gross per 24 hour  Intake --  Output 1550 ml  Net -1550 ml   There were no vitals filed for this visit.  Examination:   General exam: Appears calm and comfortable ,Not in  distress,obese HEENT:PERRL,Oral mucosa moist, Ear/Nose normal on gross exam Respiratory system: Bilateral equal air entry, normal vesicular breath sounds, no wheezes or crackles  Cardiovascular system: S1 & S2 heard, RRR. No JVD, murmurs, rubs, gallops or clicks. Gastrointestinal system: Abdomen is nondistended, soft and nontender. No organomegaly or masses felt. Normal bowel sounds heard. Central nervous system: Alert and oriented. No focal neurological deficits. Extremities: No edema, no clubbing ,no cyanosis Skin: No rashes, lesions or ulcers,no icterus ,no pallor   Data Reviewed: I have personally reviewed following labs and imaging studies  CBC: Recent Labs  Lab 01/05/20 1401  WBC 11.1*  NEUTROABS 6.5  HGB 13.0  HCT 41.7  MCV 87.4  PLT 258   Basic Metabolic Panel: Recent Labs  Lab 01/05/20 1401 01/06/20 0842  NA 132* 137  K 5.3* 4.3  CL 98 102  CO2 22 24  GLUCOSE 135* 161*  BUN 15 13  CREATININE 0.78 0.72  CALCIUM 9.3 9.1   GFR: CrCl cannot be calculated (Unknown ideal weight.). Liver Function Tests: Recent Labs  Lab 01/05/20 1401  AST 18  ALT 12  ALKPHOS 57  BILITOT 0.3  PROT 8.0  ALBUMIN 3.5   No results for input(s): LIPASE, AMYLASE in the last 168 hours. No results for input(s): AMMONIA in the last 168 hours. Coagulation Profile: Recent Labs  Lab 01/05/20 1401  INR 1.0   Cardiac Enzymes: No results for input(s): CKTOTAL, CKMB, CKMBINDEX, TROPONINI in the last 168 hours. BNP (last 3 results) No results for input(s): PROBNP in the last 8760 hours. HbA1C: Recent Labs    01/06/20 0235  HGBA1C 7.6*   CBG: Recent Labs  Lab 01/05/20 1949 01/06/20 1202 01/06/20 1703 01/06/20 2128 01/07/20 0603  GLUCAP 151* 207* 152* 176* 140*   Lipid Profile: Recent Labs    01/06/20 0235  CHOL 265*  HDL 56  LDLCALC 196*  TRIG 65  CHOLHDL 4.7   Thyroid Function Tests: No results for input(s): TSH, T4TOTAL, FREET4, T3FREE, THYROIDAB in the last  72 hours. Anemia Panel: No results for input(s): VITAMINB12, FOLATE, FERRITIN, TIBC, IRON, RETICCTPCT in the last 72 hours. Sepsis Labs: No results for input(s): PROCALCITON, LATICACIDVEN in the last 168 hours.  Recent Results (from the past 240 hour(s))  SARS Coronavirus 2 by RT PCR (hospital order, performed in [facility] hospital lab) Nasopharyngeal Nasopharyngeal Swab     Status: None   Collection Time: 01/05/20  9:26 PM   Specimen: Nasopharyngeal Swab  Result Value Ref Range Status   SARS Coronavirus 2 NEGATIVE NEGATIVE Final    Comment: (NOTE) SARS-CoV-2 target nucleic acids are NOT DETECTED.  The SARS-CoV-2 RNA is generally detectable in upper and lower respiratory specimens during the acute phase of infection. The lowest concentration of SARS-CoV-2 viral copies this assay can detect is 250 copies / mL. A negative result does not preclude SARS-CoV-2 infection and should not be used as the sole basis for treatment or other patient management decisions.  A negative result may occur with improper specimen collection / handling, submission of specimen  other than nasopharyngeal swab, presence of viral mutation(s) within the areas targeted by this assay, and inadequate number of viral copies (<250 copies / mL). A negative result must be combined with clinical observations, patient history, and epidemiological information.  Fact Sheet for Patients:   BoilerBrush.com.cy  Fact Sheet for Healthcare Providers: https://pope.com/  This test is not yet approved or  cleared by the [place] FDA and has been authorized for detection and/or diagnosis of SARS-CoV-2 by FDA under an Emergency Use Authorization (EUA).  This EUA will remain in effect (meaning this test can be used) for the duration of the COVID-19 declaration under Section 564(b)(1) of the Act, 21 U.S.C. section 360bbb-3(b)(1), unless the authorization is terminated  or revoked sooner.  Performed at [facility], [address], [place], [place] [address]          Radiology Studies: CT ANGIO HEAD W OR WO CONTRAST  Result Date: 01/06/2020 CLINICAL DATA:  Altered mental status. Stroke. Left-sided weakness. EXAM: CT ANGIOGRAPHY HEAD AND NECK TECHNIQUE: Multidetector CT imaging of the head and neck was performed using the standard protocol during bolus administration of intravenous contrast. Multiplanar CT image reconstructions and MIPs were obtained to evaluate the vascular anatomy. Carotid stenosis measurements (when applicable) are obtained utilizing NASCET criteria, using the distal internal carotid diameter as the denominator. CONTRAST:  80mL OMNIPAQUE IOHEXOL 350 MG/ML SOLN COMPARISON:  CT head 01/05/2020.  MRI head and MRA head 01/06/2020 FINDINGS: CTA NECK FINDINGS Aortic arch: 2 vessel arch with bovine branching pattern. Mild atherosclerotic disease in the aortic arch. Proximal great vessels widely patent. Right carotid system: Right common carotid artery widely patent. Medial deviation of the right carotid bifurcation posterior to the pharynx. Atherosclerotic calcification in the proximal right internal carotid artery with 25% diameter stenosis. Left carotid system: Left carotid artery widely patent without stenosis. Mild atherosclerotic disease at the bifurcation. Medial deviation of the carotid bifurcation in the retropharyngeal space. Vertebral arteries: Right vertebral artery is patent to the basilar with mild scattered atherosclerotic disease Non dominant left vertebral artery with diffuse atherosclerotic disease. There are multiple areas of stenosis in the proximal and mid and distal left vertebral artery which is patent to the basilar. There is moderate to severe stenosis in the distal left vertebral artery at the skull base and in the left V3 and V4 segments. Skeleton: Cervical spondylosis and kyphosis. No acute skeletal abnormality. Other  neck: Negative for mass or adenopathy in the neck. Upper chest: Lung apices clear bilaterally. Review of the MIP images confirms the above findings CTA HEAD FINDINGS Anterior circulation: Atherosclerotic calcification in the cavernous carotid bilaterally. Severe stenosis right supraclinoid internal carotid artery and mild to moderate stenosis right cavernous carotid. Left internal carotid artery is patent without stenosis. Hypoplastic right A1 segment. Anterior and middle cerebral arteries are patent bilaterally. Severe stenosis of the superior division of left M1 at the origin. No other significant stenosis in the anterior circulation. Posterior circulation: Severe stenosis in the distal left vertebral artery. Small basilar artery with severe stenosis in the midportion. Fetal origin of the posterior cerebral artery bilaterally. Moderate stenosis of the proximal and mid right posterior cerebral artery. Venous sinuses: Limited venous contrast due to arterial phase scanning. Anatomic variants: None Review of the MIP images confirms the above findings IMPRESSION: 1. Severe intracranial atherosclerotic disease without acute large vessel occlusion. 2. 25% diameter stenosis proximal right internal carotid artery. No significant left carotid stenosis 3. Non dominant left vertebral artery with diffuse atherosclerotic  disease and multiple areas of stenosis. Severe stenosis distal left vertebral artery. Right vertebral artery is disease without significant stenosis. Electronically Signed   By: Marlan Palau M.D.   On: 01/06/2020 14:31   DG Chest 2 View  Result Date: 01/05/2020 CLINICAL DATA:  Acute ischemic stroke. EXAM: CHEST - 2 VIEW COMPARISON:  10/17/2013 FINDINGS: Lower lung volumes from prior exam. There are patchy opacities at the left greater than right lung base. Mild cardiomegaly. Aortic atherosclerosis. No significant pleural effusion. There may be minimal fluid in the fissures. No pneumothorax. No acute  osseous abnormalities are seen. IMPRESSION: 1. Patchy bibasilar opacities, left greater than right, may be atelectasis or pneumonia. 2. Mild cardiomegaly.  Aortic Atherosclerosis (ICD10-I70.0). Electronically Signed   By: Narda Rutherford M.D.   On: 01/05/2020 22:43   CT HEAD WO CONTRAST  Result Date: 01/05/2020 CLINICAL DATA:  Increased left leg weakness. EXAM: CT HEAD WITHOUT CONTRAST TECHNIQUE: Contiguous axial images were obtained from the base of the skull through the vertex without intravenous contrast. COMPARISON:  January 26, 2017 FINDINGS: Brain: There is mild cerebral atrophy with widening of the extra-axial spaces and ventricular dilatation. There are areas of decreased attenuation within the white matter tracts of the supratentorial brain, consistent with microvascular disease changes. Small chronic left basal ganglia lacunar infarcts are noted. Vascular: No hyperdense vessel or unexpected calcification. Skull: Normal. Negative for fracture or focal lesion. Sinuses/Orbits: There is mild sphenoid sinus mucosal thickening. Other: None. IMPRESSION: 1. Generalized cerebral atrophy. 2. Small chronic left basal ganglia lacunar infarcts. 3. No acute intracranial abnormality. Electronically Signed   By: Aram Candela M.D.   On: 01/05/2020 15:42   CT ANGIO NECK W OR WO CONTRAST  Result Date: 01/06/2020 CLINICAL DATA:  Altered mental status. Stroke. Left-sided weakness. EXAM: CT ANGIOGRAPHY HEAD AND NECK TECHNIQUE: Multidetector CT imaging of the head and neck was performed using the standard protocol during bolus administration of intravenous contrast. Multiplanar CT image reconstructions and MIPs were obtained to evaluate the vascular anatomy. Carotid stenosis measurements (when applicable) are obtained utilizing NASCET criteria, using the distal internal carotid diameter as the denominator. CONTRAST:  29mL OMNIPAQUE IOHEXOL 350 MG/ML SOLN COMPARISON:  CT head 01/05/2020.  MRI head and MRA head  01/06/2020 FINDINGS: CTA NECK FINDINGS Aortic arch: 2 vessel arch with bovine branching pattern. Mild atherosclerotic disease in the aortic arch. Proximal great vessels widely patent. Right carotid system: Right common carotid artery widely patent. Medial deviation of the right carotid bifurcation posterior to the pharynx. Atherosclerotic calcification in the proximal right internal carotid artery with 25% diameter stenosis. Left carotid system: Left carotid artery widely patent without stenosis. Mild atherosclerotic disease at the bifurcation. Medial deviation of the carotid bifurcation in the retropharyngeal space. Vertebral arteries: Right vertebral artery is patent to the basilar with mild scattered atherosclerotic disease Non dominant left vertebral artery with diffuse atherosclerotic disease. There are multiple areas of stenosis in the proximal and mid and distal left vertebral artery which is patent to the basilar. There is moderate to severe stenosis in the distal left vertebral artery at the skull base and in the left V3 and V4 segments. Skeleton: Cervical spondylosis and kyphosis. No acute skeletal abnormality. Other neck: Negative for mass or adenopathy in the neck. Upper chest: Lung apices clear bilaterally. Review of the MIP images confirms the above findings CTA HEAD FINDINGS Anterior circulation: Atherosclerotic calcification in the cavernous carotid bilaterally. Severe stenosis right supraclinoid internal carotid artery and mild to moderate stenosis right cavernous  carotid. Left internal carotid artery is patent without stenosis. Hypoplastic right A1 segment. Anterior and middle cerebral arteries are patent bilaterally. Severe stenosis of the superior division of left M1 at the origin. No other significant stenosis in the anterior circulation. Posterior circulation: Severe stenosis in the distal left vertebral artery. Small basilar artery with severe stenosis in the midportion. Fetal origin of the  posterior cerebral artery bilaterally. Moderate stenosis of the proximal and mid right posterior cerebral artery. Venous sinuses: Limited venous contrast due to arterial phase scanning. Anatomic variants: None Review of the MIP images confirms the above findings IMPRESSION: 1. Severe intracranial atherosclerotic disease without acute large vessel occlusion. 2. 25% diameter stenosis proximal right internal carotid artery. No significant left carotid stenosis 3. Non dominant left vertebral artery with diffuse atherosclerotic disease and multiple areas of stenosis. Severe stenosis distal left vertebral artery. Right vertebral artery is disease without significant stenosis. Electronically Signed   By: Marlan Palau M.D.   On: 01/06/2020 14:31   MR ANGIO HEAD WO CONTRAST  Result Date: 01/06/2020 CLINICAL DATA:  Infarcts on MRI brain EXAM: MRA HEAD WITHOUT CONTRAST TECHNIQUE: Angiographic images of the Circle of Willis were obtained using MRA technique without intravenous contrast. COMPARISON:  None. FINDINGS: Motion artifact is present. Intracranial internal carotid arteries are patent with atherosclerotic irregularity and at least moderate stenosis of the supraclinoid portion. Middle and anterior cerebral arteries are patent. Right A1 ACA is congenitally small or stenotic. Proximal left M2 MCA moderate to marked stenosis. Intracranial vertebral arteries, basilar artery, posterior cerebral arteries are patent. Moderate to marked stenosis of the mid basilar. Bilateral PCA atherosclerotic irregularity with right P2 stenosis. Bilateral posterior communicating arteries are present. There is no aneurysm. IMPRESSION: No proximal intracranial vessel occlusion. Multifocal atherosclerotic irregularity and stenoses. Electronically Signed   By: Guadlupe Spanish M.D.   On: 01/06/2020 11:20   MR BRAIN WO CONTRAST  Result Date: 01/06/2020 CLINICAL DATA:  Left-sided weakness over the last week. EXAM: MRI HEAD WITHOUT CONTRAST  TECHNIQUE: Multiplanar, multiecho pulse sequences of the brain and surrounding structures were obtained without intravenous contrast. COMPARISON:  CT head without contrast 01/05/2020 FINDINGS: Brain: Arcs the diffusion-weighted images demonstrate acute/subacute nonhemorrhagic infarcts involving the right corona radiata. Additional subcortical white matter infarcts are present in the posterior right temporal and right occipital lobes. Minimal cortical involvement is present. Extensive white matter disease is present bilaterally. Remote lacunar infarcts are present within the corona radiata bilaterally. Remote lacunar infarcts are also present in the left lentiform nucleus. Remote lacunar infarct is present in the left thalamus. Remote white matter infarcts are present adjacent to the atrium of the right lateral ventricle. A remote lacunar infarct is present in the left paramedian pons. White matter changes extend through the brainstem. Remote lacunar infarcts are present in the posteroinferior right cerebellum. The ventricles are proportionate to the degree of atrophy. No significant extraaxial fluid collection is present. The internal auditory canals are within normal limits. Vascular: Flow is present in the major intracranial arteries. Skull and upper cervical spine: Mild degenerative changes are present in the upper cervical spine. Craniocervical junction is normal. Marrow signal is normal. Sinuses/Orbits: A fluid level is present in the right sphenoid sinus. The paranasal sinuses and mastoid air cells are otherwise clear. Bilateral lens replacements are noted. Globes and orbits are otherwise unremarkable. IMPRESSION: 1. Acute/subacute nonhemorrhagic infarcts involving the right corona radiata and posterior right temporal and occipital lobes. Findings correspond to the subacute time frame of left-sided weakness. 2. Remote  lacunar infarcts of the corona radiata bilaterally, left thalamus, and left paramedian pons.  3. Extensive white matter disease likely reflects the sequela of chronic microvascular ischemia. 4. Right sphenoid sinus disease. Electronically Signed   By: Marin Roberts M.D.   On: 01/06/2020 05:33   ECHOCARDIOGRAM COMPLETE  Result Date: 01/06/2020    ECHOCARDIOGRAM REPORT   Patient Name:   CAMI DELAWDER Date of Exam: 01/06/2020 Medical Rec #:  [id number]      Height:       61.0 in Accession #:    [id number]     Weight:       174.4 lb Date of Birth:  03/31/1941       BSA:          1.782 m Patient Age:    78 years       BP:           179/70 mmHg Patient Gender: F              HR:           88 bpm. Exam Location:  Inpatient Procedure: 2D Echo Indications:     Stroke 434.91 / I163.9  History:         Patient has prior history of Echocardiogram examinations, most                  recent 01/28/2017. TIA; Risk Factors:Hypertension and Diabetes.                  Thyroid disease.  Sonographer:     Leta Jungling RDCS Referring Phys:  [id number] [place] Diagnosing Phys: Armanda Magic MD  Sonographer Comments: No IV access. IMPRESSIONS  1. There is a mid cavitary resting gradient of due to hyperdynamic LVF.     Marland Kitchen Left ventricular ejection fraction, by estimation, is 65 to 70%. The left ventricle has normal function. The left ventricle has no regional wall motion abnormalities. Left ventricular diastolic parameters are consistent with Grade I diastolic dysfunction (impaired relaxation). Elevated left ventricular end-diastolic pressure.  2. Right ventricular systolic function is normal. The right ventricular size is normal. Tricuspid regurgitation signal is inadequate for assessing PA pressure.  3. The mitral valve is normal in structure. No evidence of mitral valve regurgitation. Mild mitral stenosis. The mean mitral valve gradient is 6.5 mmHg.  4. The aortic valve is tricuspid. Aortic valve regurgitation is not visualized. Mild to moderate aortic valve sclerosis/calcification is present, without any evidence  of aortic stenosis. Aortic valve mean gradient measures 9.0 mmHg. Aortic valve Vmax measures 1.97 m/s.  5. Aortic dilatation noted. 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  6. The inferior vena cava is normal in size with greater than 50% respiratory variability, suggesting right atrial pressure of 3 mmHg. FINDINGS  Left Ventricle: There is a mid cavitary resting gradient of due to hyperdynamic LVF. Left ventricular ejection fraction, by estimation, is 65 to 70%. The left ventricle has normal function. The left ventricle has no regional wall motion abnormalities. The left ventricular internal cavity size was normal in size. There is no left ventricular hypertrophy. Left ventricular diastolic parameters are consistent with Grade I diastolic dysfunction (impaired relaxation). Elevated left ventricular end-diastolic pressure. Right Ventricle: The right ventricular size is normal. No increase in right ventricular wall thickness. Right ventricular systolic function is normal. Tricuspid regurgitation signal is inadequate for assessing PA pressure. Left Atrium: Left atrial size was normal in size.  Right Atrium: Right atrial size was normal in size. Pericardium: There is no evidence of pericardial effusion. Mitral Valve: The mitral valve is normal in structure. Normal mobility of the mitral valve leaflets. Severe mitral annular calcification. No evidence of mitral valve regurgitation. Mild mitral valve stenosis. MV peak gradient, 18.6 mmHg. The mean mitral valve gradient is 6.5 mmHg. Tricuspid Valve: The tricuspid valve is normal in structure. Tricuspid valve regurgitation is not demonstrated. No evidence of tricuspid stenosis. Aortic Valve: The aortic valve is tricuspid. Aortic valve regurgitation is not visualized. Mild to moderate aortic valve sclerosis/calcification is present, without any evidence of aortic stenosis. Aortic valve mean gradient measures 9.0 mmHg. Aortic valve peak  gradient measures 15.5 mmHg. Aortic valve area, by VTI measures 1.06 cm. Pulmonic Valve: The pulmonic valve was normal in structure. Pulmonic valve regurgitation is trivial. No evidence of pulmonic stenosis. Aorta: Aortic dilatation noted. 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 Venous: The inferior vena cava was not well visualized. The inferior vena cava is normal in size with greater than 50% respiratory variability, suggesting right atrial pressure of 3 mmHg. IAS/Shunts: No atrial level shunt detected by color flow Doppler.  LEFT VENTRICLE PLAX 2D LVIDd:         3.80 cm  Diastology LVIDs:         2.60 cm  LV e' lateral:   4.46 cm/s LV PW:         1.00 cm  LV E/e' lateral: 26.2 LV IVS:        1.10 cm  LV e' medial:    4.13 cm/s LVOT diam:     1.60 cm  LV E/e' medial:  28.3 LV SV:         38 LV SV Index:   21 LVOT Area:     2.01 cm  RIGHT VENTRICLE TAPSE (M-mode): 1.6 cm LEFT ATRIUM             Index LA diam:        3.10 cm 1.74 cm/m LA Vol (A2C):   30.1 ml 16.89 ml/m LA Vol (A4C):   29.3 ml 16.44 ml/m LA Biplane Vol: 34.9 ml 19.59 ml/m  AORTIC VALVE AV Area (Vmax):    1.30 cm AV Area (Vmean):   1.34 cm AV Area (VTI):     1.06 cm AV Vmax:           197.00 cm/s AV Vmean:          135.000 cm/s AV VTI:            0.355 m AV Peak Grad:      15.5 mmHg AV Mean Grad:      9.0 mmHg LVOT Vmax:         127.00 cm/s LVOT Vmean:        89.900 cm/s LVOT VTI:          0.187 m LVOT/AV VTI ratio: 0.53  AORTA Ao Root diam: 3.40 cm MITRAL VALVE MV Area (PHT): 2.48 cm     SHUNTS MV Peak grad:  18.6 mmHg    Systemic VTI:  0.19 m MV Mean grad:  6.5 mmHg     Systemic Diam: 1.60 cm MV Vmax:       2.16 m/s MV Vmean:      117.5 cm/s MV Decel Time: 306 msec MV E velocity: 117.00 cm/s MV A velocity: 209.00 cm/s MV E/A ratio:  0.56 Armanda Magicraci Turner MD Electronically signed by Armanda Magicraci Turner  MD Signature Date/Time: 01/06/2020/8:59:43 AM    Final (Updated)         Scheduled Meds: . aspirin  300 mg Rectal  Daily   Or  . aspirin  325 mg Oral Daily  . clopidogrel  75 mg Oral Daily  . enoxaparin (LOVENOX) injection  40 mg Subcutaneous Q24H  . levothyroxine  88 mcg Oral QAC breakfast  . losartan  50 mg Oral QHS  . sodium chloride flush  3 mL Intravenous Once   Continuous Infusions:   LOS: 1 day    Time spent:35 mins. More than 50% of that time was spent in counseling and/or coordination of care.      Burnadette Pop, MD [facility] P6/23/2021, 8:51 AM

## 2020-01-07 NOTE — Evaluation (Signed)
Occupational Therapy Evaluation Patient Details Name: Jill Rose MRN: [id number] DOB: [birth year] Today's Date: 01/07/2020    History of Present Illness Pt is a 79 y/o female admitted secondary to worsening L sided weakness. 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 PMH includes HTN.    Clinical Impression   Pt admitted with the above diagnoses and presents with below problem list. Pt will benefit from continued acute OT to address the below listed deficits and maximize independence with basic ADLs prior to d/c to venue below. PTA pt was mod I to independent with ADLs. Pt presents with L side hemiplegia, impaired proprioception and coordination. Pt is currently mod A with UB/LB ADLs, mod A to stand pivot to access recliner. Pt for now will need +2 assist to attempt further mobilizing in future sessions.  Pt utilized rw this session and assist needed to steady pt and support L knee. Pt unable to clear L foot from floor in standing this session but could assist in sliding LLE to access recliner positioned to her strong side. Daughter present throughout session, sister arrived at end of session. Feel pt would be an excellent candidate for an intensive rehab program at d/c. Of note pt, report pain (FACES 4/10) in posterior neck and L upper arm.      Follow Up Recommendations  CIR    Equipment Recommendations  Other (comment) (TBD)    Recommendations for Other Services Rehab consult     Precautions / Restrictions Precautions Precautions: Fall Restrictions Weight Bearing Restrictions: No      Mobility Bed Mobility Overal bed mobility: Needs Assistance Bed Mobility: Supine to Sit     Supine to sit: Mod assist     General bed mobility comments: mod A to power up trunk, advance LLE, and fully pivot hips to EOB position.   Transfers Overall transfer level: Needs assistance Equipment used: Rolling walker (2 wheeled) Transfers: Sit to/from  [facility] Sit to Stand: Mod assist Stand pivot transfers: Mod assist       General transfer comment: EOB to recliner. Pt utilized rw this session. Unable to clear L foot from ground but could slide LLE along with assist to stabilize L knee. Steadying assist and some assist to advance rw as well.     Balance Overall balance assessment: Needs assistance Sitting-balance support: No upper extremity supported;Feet supported Sitting balance-Leahy Scale: Fair     Standing balance support: Bilateral upper extremity supported;During functional activity Standing balance-Leahy Scale: Poor Standing balance comment: Reliant on UE and external support                            ADL either performed or assessed with clinical judgement   ADL Overall ADL's : Needs assistance/impaired Eating/Feeding: Set up;Sitting   Grooming: Set up;Minimal assistance;Sitting Grooming Details (indicate cue type and reason): min A for throughness incorporating LUE Upper Body Bathing: Sitting;Moderate assistance   Lower Body Bathing: Moderate assistance;Sit to/from stand   Upper Body Dressing : Sitting;Moderate assistance   Lower Body Dressing: Moderate assistance;Sit to/from stand   Toilet Transfer: Moderate assistance;Stand-pivot;BSC;RW Toilet Transfer Details (indicate cue type and reason): simulated with EOB to recliner positioned on strong side. Extra time and assist to stabilze L knee. Pt unable to clear L foot from ground but can slide LLE a bit with assist  Toileting- Clothing Manipulation and Hygiene: Moderate assistance;Sit to/from stand  General ADL Comments: Pt completed bed mobility, sat EOB a few minutes then completed SPT to recliner with mod A and rw utilized. Daughter present.      Vision Baseline Vision/History: Wears glasses Wears Glasses: At all times Patient Visual Report: No change from baseline Additional Comments: pt reports no acute change.  given location of CVA may benefit from further vision testing just to be sure     Perception Perception Perception Tested?: Yes Perception Deficits: Inattention/neglect Comments: possible mild LUE neglect noted during bed mobility.    Praxis      Pertinent Vitals/Pain Pain Assessment: Faces Faces Pain Scale: Hurts little more Pain Location: posterior neck, L upper arm Pain Descriptors / Indicators: Sore;Tightness Pain Intervention(s): Monitored during session;Limited activity within patient's tolerance;Repositioned;Heat applied;Utilized relaxation techniques     Hand Dominance Right   Extremity/Trunk Assessment Upper Extremity Assessment Upper Extremity Assessment: LUE deficits/detail LUE Deficits / Details: grossly 3+/5 strength, impaired prorioception and fine motor coordination. Pt reports tactile sensation intact LUE Sensation: decreased proprioception LUE Coordination: decreased fine motor;decreased gross motor (gross motor vs. proprioception vs weakness)   Lower Extremity Assessment Lower Extremity Assessment: Defer to PT evaluation       Communication Communication Communication: No difficulties;HOH   Cognition Arousal/Alertness: Awake/alert Behavior During Therapy: WFL for tasks assessed/performed Overall Cognitive Status: Within Functional Limits for tasks assessed                                     General Comments  Pt daughter present throughout session, sister arrived at end of session.     Exercises Exercises: Other exercises Other Exercises Other Exercises: discussed A/AROM exercises of LUE, gentle neck ROM exercises, shoulder and scapular mobilization exercises. Provided pt with hand held therapy squeeze ball and instructed in use.    Shoulder Instructions      Home Living Family/patient expects to be discharged to:: Private residence Living Arrangements: Spouse/significant other Available Help at Discharge: Family Type of Home:  House Home Access: Stairs to enter Secretary/administrator of Steps: 3-4 Entrance Stairs-Rails: Right;Left Home Layout: Two level Alternate Level Stairs-Number of Steps: flight Alternate Level Stairs-Rails: Left Bathroom Shower/Tub: Chief Strategy Officer: Standard     Home Equipment: None          Prior Functioning/Environment Level of Independence: Independent        Comments: daughter reports recent use of AD with mobility        OT Problem List: Decreased strength;Impaired balance (sitting and/or standing);Decreased coordination;Decreased knowledge of use of DME or AE;Decreased knowledge of precautions;Impaired sensation;Impaired tone;Impaired UE functional use;Pain      OT Treatment/Interventions: Self-care/ADL training;Therapeutic exercise;Neuromuscular education;DME and/or AE instruction;Therapeutic activities;Patient/family education;Balance training    OT Goals(Current goals can be found in the care plan section) Acute Rehab OT Goals Patient Stated Goal: be able to get up to a toilet OT Goal Formulation: With patient/family Time For Goal Achievement: 01/21/20 Potential to Achieve Goals: Good ADL Goals Pt Will Perform Grooming: with modified independence;sitting Pt Will Perform Upper Body Bathing: with modified independence;sitting Pt Will Perform Lower Body Bathing: with min guard assist;sit to/from stand;with adaptive equipment Pt Will Transfer to Toilet: with min guard assist;ambulating Pt Will Perform Toileting - Clothing Manipulation and hygiene: with adaptive equipment;sit to/from stand;with min guard assist Pt/caregiver will Perform Home Exercise Program: Increased strength;Left upper extremity;Independently  OT Frequency: Min 2X/week   Barriers to D/C:  Co-evaluation              AM-PAC OT "6 Clicks" Daily Activity     Outcome Measure Help from another person eating meals?: None Help from another person taking care of  personal grooming?: A Little Help from another person toileting, which includes using toliet, bedpan, or urinal?: A Lot Help from another person bathing (including washing, rinsing, drying)?: A Lot Help from another person to put on and taking off regular upper body clothing?: A Lot Help from another person to put on and taking off regular lower body clothing?: A Lot 6 Click Score: 15   End of Session Equipment Utilized During Treatment: Gait belt;Rolling walker Nurse Communication: Other (comment);Mobility status (chair alarm box not working)  Activity Tolerance: Patient tolerated treatment well Patient left: in chair;with call bell/phone within reach;with family/visitor present;Other (comment) (chair alarm pad used but box not working- nursing notified)  OT Visit Diagnosis: Unsteadiness on feet (R26.81);Other abnormalities of gait and mobility (R26.89);Pain;Hemiplegia and hemiparesis;Ataxia, unspecified (R27.0) Hemiplegia - Right/Left: Left Hemiplegia - dominant/non-dominant: Non-Dominant                Time: 2951-8841 OT Time Calculation (min): 40 min Charges:  OT General Charges $OT Visit: 1 Visit OT Evaluation $OT Eval Moderate Complexity: 1 Mod OT Treatments $Self Care/Home Management : 8-22 mins $Therapeutic Exercise: 8-22 mins  Raynald Kemp, OT Acute Rehabilitation Services Pager: [phone] Office: [phone]   Pilar Grammes 01/07/2020, 1:05 PM

## 2020-01-08 LAB — GLUCOSE, CAPILLARY
Glucose-Capillary: 138 mg/dL — ABNORMAL HIGH (ref 70–99)
Glucose-Capillary: 144 mg/dL — ABNORMAL HIGH (ref 70–99)
Glucose-Capillary: 139 mg/dL — ABNORMAL HIGH (ref 70–99)
Glucose-Capillary: 158 mg/dL — ABNORMAL HIGH (ref 70–99)

## 2020-01-08 MED ORDER — ATORVASTATIN CALCIUM 80 MG PO TABS
80.0000 mg | ORAL_TABLET | Freq: Every day | ORAL | Status: DC
  Administered 2020-01-08 – 2020-01-14 (×7): 80 mg via ORAL
  Filled 2020-01-08 (×7): qty 1

## 2020-01-08 MED ORDER — AMLODIPINE BESYLATE 10 MG PO TABS
10.0000 mg | ORAL_TABLET | Freq: Every day | ORAL | Status: DC
  Administered 2020-01-08 – 2020-01-14 (×7): 10 mg via ORAL
  Filled 2020-01-08 (×7): qty 1

## 2020-01-08 MED ORDER — GUAIFENESIN-DM 100-10 MG/5ML PO SYRP
5.0000 mL | ORAL_SOLUTION | ORAL | Status: DC | PRN
  Administered 2020-01-08: 5 mL via ORAL
  Filled 2020-01-08 (×2): qty 5

## 2020-01-08 MED ORDER — FLUTICASONE PROPIONATE 50 MCG/ACT NA SUSP
1.0000 | Freq: Every day | NASAL | Status: DC
  Administered 2020-01-08: 1 via NASAL
  Filled 2020-01-08: qty 16

## 2020-01-08 NOTE — Progress Notes (Signed)
Physical Therapy Treatment Patient Details Name: Jill Rose MRN: [id number] DOB: [birth year] Today's Date: 01/08/2020    History of Present Illness Pt is a 79 y/o female admitted secondary to worsening L sided weakness. 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 PMH includes HTN.     PT Comments    Pt remains very limited overall secondary to fatigue, L sided weakness, difficulty with motor planning and coordination, balance deficits and cognitive impairments. She continues to require physical assistance for transfers and demonstrates significant difficulties with advancement of either LE with pivotal steps. Continue to recommend pt d/c to CIR for further intensive therapy services to maximize her independence with functional mobility prior to returning home with family support.    Follow Up Recommendations  CIR     Equipment Recommendations  Other (comment) (defer to next venue of care)    Recommendations for Other Services       Precautions / Restrictions Precautions Precautions: Fall Restrictions Weight Bearing Restrictions: No    Mobility  Bed Mobility               General bed mobility comments: pt OOB in recliner chair upon arrival  Transfers Overall transfer level: Needs assistance Equipment used: Rolling walker (2 wheeled) Transfers: Sit to/from [facility] Sit to Stand: Mod assist Stand pivot transfers: Mod assist       General transfer comment: max cueing needed for technique and safe hand placement with use of RW; max cueing also needed for sequencing for pivotal steps from chair to [facility] and back to chair; mod A needed to power into standing from chair x1 and from [facility] x1  Ambulation/Gait                 Stairs             Wheelchair Mobility    Modified Rankin (Stroke Patients Only) Modified Rankin (Stroke Patients Only) Pre-Morbid Rankin Score: No symptoms Modified Rankin:  Moderately severe disability     Balance Overall balance assessment: Needs assistance Sitting-balance support: No upper extremity supported;Feet supported Sitting balance-Leahy Scale: Good     Standing balance support: Bilateral upper extremity supported;During functional activity Standing balance-Leahy Scale: Poor Standing balance comment: Reliant on UE and external support                             Cognition Arousal/Alertness: Awake/alert Behavior During Therapy: WFL for tasks assessed/performed Overall Cognitive Status: Impaired/Different from baseline Area of Impairment: Memory;Safety/judgement;Problem solving                     Memory: Decreased short-term memory   Safety/Judgement: Decreased awareness of deficits   Problem Solving: Slow processing;Difficulty sequencing;Requires verbal cues;Requires tactile cues        Exercises      General Comments        Pertinent Vitals/Pain Pain Assessment: No/denies pain    Home Living                      Prior Function            PT Goals (current goals can now be found in the care plan section) Acute Rehab PT Goals PT Goal Formulation: With patient Time For Goal Achievement: 01/20/20 Potential to Achieve Goals: Good Progress towards PT goals: Progressing toward goals    Frequency    Min 5X/week  PT Plan Current plan remains appropriate    Co-evaluation              AM-PAC PT "6 Clicks" Mobility   Outcome Measure  Help needed turning from your back to your side while in a flat bed without using bedrails?: A Little Help needed moving from lying on your back to sitting on the side of a flat bed without using bedrails?: A Lot Help needed moving to and from a bed to a chair (including a wheelchair)?: A Lot Help needed standing up from a chair using your arms (e.g., wheelchair or bedside chair)?: A Lot Help needed to walk in hospital room?: A Lot Help needed  climbing 3-5 steps with a railing? : Total 6 Click Score: 12    End of Session Equipment Utilized During Treatment: Gait belt Activity Tolerance: Patient tolerated treatment well Patient left: in chair;with call bell/phone within reach;with chair alarm set;with family/visitor present Nurse Communication: Mobility status PT Visit Diagnosis: Unsteadiness on feet (R26.81);Difficulty in walking, not elsewhere classified (R26.2);Hemiplegia and hemiparesis Hemiplegia - Right/Left: Left Hemiplegia - caused by: Cerebral infarction     Time: 1242-1300 PT Time Calculation (min) (ACUTE ONLY): 18 min  Charges:  $Therapeutic Activity: 8-22 mins                     [name], DPT  Acute Rehabilitation Services Pager [phone] Office [phone]     Jill Rose 01/08/2020, 3:13 PM

## 2020-01-08 NOTE — Evaluation (Signed)
Speech Language Pathology Evaluation Patient Details Name: Jill Rose MRN: [id number] DOB: 1941-01-25 Today's Date: 01/08/2020 Time: 4098-1191 SLP Time Calculation (min) (ACUTE ONLY): 25 min  Problem List:  Patient Active Problem List   Diagnosis Date Noted  . Controlled type 2 diabetes mellitus with hyperglycemia (HCC)   . Morbid obesity (HCC)   . Leukocytosis   . History of TIA (transient ischemic attack)   . Acute CVA (cerebrovascular accident) (HCC) 01/06/2020  . Acute ischemic stroke (HCC) 01/05/2020  . Hypertensive emergency 01/26/2017  . TIA (transient ischemic attack) 01/26/2017  . Hypothyroidism 01/26/2017  . Aphasia   . Essential hypertension   . Diabetes mellitus without complication [facility])    Past Medical History:  Past Medical History:  Diagnosis Date  . Diabetes mellitus without complication (HCC)   . Essential hypertension   . Hypothyroidism   . TIA (transient ischemic attack) 2018   Past Surgical History: History reviewed. No pertinent surgical history. HPI:  Patient is a 79 year old female with history of hypertension, diabetes type 2, hypothyroidism, history of TIA who presented with left-sided weakness.  She had weakness more on the left lower extremity than the upper.  It has been ongoing for last 1 week.  She also feels that her speech is slurred.  She had difficulty getting up the stairs due to weakness of the leg.  CT head on presentation showed old left basal ganglia strokes.  MRI of the brain showed acute/subacute nonhemorrhagic right-sided infarct.  Mild baseine slurred speech that is worse when fatigued. PT/OT recommended CIR.   Assessment / Plan / Recommendation Clinical Impression  Mrs. Schexnider demonstrates an increased cognitive-linguistic impairment. She has mild baseline deficits from prior CVA, which daughter reports had mostly resolved with the exception of some dysarthria when fatigued. Pt now c/o word finding deficits described as "when I'm  talking I can't remember what I'm trying to say," which may be more r/t working memory/attention than true word finding. Pt was assessed with portions of the [facility] Dysarthria Assessment Tool and portions of the COGNISTAT. Pt demonstrates fully intelligible speech to this unfamiliar listener. She had noted reduced max phonation time/breath support, but denies SOB with speech. She had notable cognitive impairment in areas of memory, attention, and generative naming. Husband handles driving and money mgmt, however, pt was handling medication management prior to this hosptialization--daughter reports that "mom was resistant to take many of her medications." Upon elaboration, pt was not forgetting to take medications, she was intentionally refusing.   Pt would likely benefit from speech therapy at next level of care (inpatient rehab) to target speech strategies when fatigued, memory, attention, executive functions. Pt/daughter educated on recommendations for CIR and to continue assistance with IADLs at this time. Pt should have someone supervise medication management. No further acute needs at this time.  COGNISTAT: Orientation: 12/12 Attention: 2/8 Calculations: 4/4 Memory: 3/12 Judgment: 6/6 Reasoning: 8/8 Naming: 8/8  NDAT: MPT: 2.5 seconds Intelligibility: 100% in conversation [place]: WNL Diadokokinesis: fair (5 repetitions in 5 seconds) Respiration: shallow, clavicular breathing    SLP Assessment  SLP Recommendation/Assessment: All further Speech Lanaguage Pathology  needs can be addressed in the next venue of care SLP Visit Diagnosis: Cognitive communication deficit (R41.841)    Follow Up Recommendations  Inpatient Rehab    Frequency and Duration   No acute needs; follow up at next level of care        SLP Evaluation Cognition  Overall Cognitive Status: Impaired/Different from baseline Arousal/Alertness: Awake/alert Orientation Level:  Oriented X4 Attention: Focused Focused  Attention: Impaired Focused Attention Impairment: Verbal complex;Functional complex Memory: Impaired Memory Impairment: Storage deficit;Decreased short term memory Decreased Short Term Memory: Functional complex Awareness: Appears intact Problem Solving: Impaired Problem Solving Impairment: Verbal complex;Functional complex Executive Function: Reasoning;Sequencing;Organizing Reasoning: Appears intact Sequencing: Impaired Sequencing Impairment: Verbal complex;Functional complex Organizing: Impaired Organizing Impairment: Verbal complex;Functional complex Safety/Judgment: Appears intact       Comprehension  Auditory Comprehension Overall Auditory Comprehension: Appears within functional limits for tasks assessed    Expression Verbal Expression Overall Verbal Expression: Appears within functional limits for tasks assessed   Oral / Motor  Oral Motor/Sensory Function Overall Oral Motor/Sensory Function: Within functional limits Motor Speech Overall Motor Speech: Appears within functional limits for tasks assessed Respiration: Impaired Level of Impairment: Conversation Phonation: Low vocal intensity Resonance: Within functional limits Articulation: Within functional limitis Intelligibility: Intelligible                      Nthony Lefferts P. Daquane Aguilar, M.S., [place] Pathologist Acute Rehabilitation Services Pager: [place] 01/08/2020, 9:12 AM

## 2020-01-08 NOTE — Progress Notes (Addendum)
PROGRESS NOTE    Jill Rose  ZOX:[id number] DOB: 08-09-1940 DOA: 01/05/2020 PCP: Ralene Ok, MD   Brief Narrative:  Patient is a 79 year old female with history of hypertension, diabetes type 2, hypothyroidism, history of TIA who presented with left-sided weakness.  She had weakness more on the left lower extremity than the upper.  It has been ongoing for last 1 week.  She also feels that her speech is slurred.  She had difficulty getting up the stairs due to weakness of the leg.  CT head on presentation showed old left basal ganglia strokes.  MRI of the brain showed acute/subacute nonhemorrhagic right-sided infarct.  Patient admitted for stroke work-up.  Neurology consulted.  Physical therapy recommended inpatient rehab.  Patient is medically stable for discharge to CIR as soon as bed is available.  Assessment & Plan:   Principal Problem:   Acute ischemic stroke [facility]) Active Problems:   Hypothyroidism   Essential hypertension   Acute CVA (cerebrovascular accident) (HCC)   Controlled type 2 diabetes mellitus with hyperglycemia (HCC)   Morbid obesity (HCC)   Leukocytosis   History of TIA (transient ischemic attack)   Acute ischemic stroke: MRI showed acute/subacute nonhemorrhagic infarcts involving the right corona radiata and posterior right temporal and occipital lobes,remote lacunar infarcts of the corona radiata bilaterally, left thalamus, and left paramedian pons. MRA showed multifocal atherosclerosis no proximal occlusion.  CT head and neck showed severe intracranial atherosclerosis without large vessel occlusion.  Proximal ICA has 25% stenosis. PT/OT recommending CIR. LDL of 196.  Hemoglobin A1c is 7.6 CT angio head and neck showed severe intracranial atherosclerotic disease without acute large vessel occlusion,25% diameter stenosis proximal right internal carotid artery. No significant left carotid stenosis,non dominant left vertebral artery with diffuse  atherosclerotic disease and multiple areas of stenosis. Severe stenosis distal left vertebral artery. Echocardiogram showed left ventricular ejection fraction of 65 to 70%, grade 1 diastolic dysfunction, no atrial level shunt, no cardiac source of emboli Started on aspirin and Plavix.  Neurology recommending aspirin 325 mg daily and Plavix for 3 months then Plavix alone.  Started on Lipitor She doesnot have any focal neurological deficits now.  Hypertension: Currently hypertensive.  Allow permissive hypertension.  Gradually normalize blood pressure in 3 to 5 days.  She is on losartan at home.  Continue as needed medications for severe hypertension.  Monitor blood pressure.Added amlodipine 10 mg.  Hyperlipidemia: LDL of 196.  Started on Lipitor 80 mg daily  Hypothyroidism: Continue Synthyroid  Diabetes type 2: Not on any medications at home.  Hemoglobin A1c was 7.6.  Continue sliding-scale insulin here.  We will put her on Metformin on discharge.        DVT prophylaxis:Lovenox Code Status: Full Family Communication:Daughter  present at the bedside Status is: Inpatient   Dispo: The patient is from: Home              Anticipated d/c is to: CIR              Anticipated d/c date is: 2-3 day Patient is medically stable for DC as soon as bed is available.                  Consultants: Neurology  Procedures:None   Antimicrobials:  Anti-infectives (From admission, onward)   None      Subjective:  Patient seen and examined at the bedside this morning.  Hemodynamically stable, mildly hypertensive today.  Medications adjusted.  She was complaining of some dry cough.  Objective: Vitals:   01/07/20 2015 01/08/20 0028 01/08/20 0451 01/08/20 0819  BP: 137/69 (!) 156/86 (!) 165/65 (!) 162/69  Pulse: 88 81 79 86  Resp: 17 17 18 18   Temp: 97.8 F (36.6 C) 98.3 F (36.8 C) 97.6 F (36.4 C) 98 F (36.7 C)  TempSrc: Oral Oral Oral Oral  SpO2: 99% 99% 99% 98%    Intake/Output  Summary (Last 24 hours) at 01/08/2020 0826 Last data filed at 01/08/2020 0749 Gross per 24 hour  Intake 360 ml  Output 950 ml  Net -590 ml   There were no vitals filed for this visit.  Examination:    General exam: Appears calm and comfortable ,Not in distress,obese HEENT:PERRL,Oral mucosa moist, Ear/Nose normal on gross exam Respiratory system: Bilateral equal air entry, normal vesicular breath sounds, no wheezes or crackles  Cardiovascular system: S1 & S2 heard, RRR. No JVD, murmurs, rubs, gallops or clicks. Gastrointestinal system: Abdomen is nondistended, soft and nontender. No organomegaly or masses felt. Normal bowel sounds heard. Central nervous system: Alert and oriented. No focal neurological deficits. Extremities: No edema, no clubbing ,no cyanosis Skin: No rashes, lesions or ulcers,no icterus ,no pallor   Data Reviewed: I have personally reviewed following labs and imaging studies  CBC: Recent Labs  Lab 01/05/20 1401  WBC 11.1*  NEUTROABS 6.5  HGB 13.0  HCT 41.7  MCV 87.4  PLT 258   Basic Metabolic Panel: Recent Labs  Lab 01/05/20 1401 01/06/20 0842  NA 132* 137  K 5.3* 4.3  CL 98 102  CO2 22 24  GLUCOSE 135* 161*  BUN 15 13  CREATININE 0.78 0.72  CALCIUM 9.3 9.1   GFR: CrCl cannot be calculated (Unknown ideal weight.). Liver Function Tests: Recent Labs  Lab 01/05/20 1401  AST 18  ALT 12  ALKPHOS 57  BILITOT 0.3  PROT 8.0  ALBUMIN 3.5   No results for input(s): LIPASE, AMYLASE in the last 168 hours. No results for input(s): AMMONIA in the last 168 hours. Coagulation Profile: Recent Labs  Lab 01/05/20 1401  INR 1.0   Cardiac Enzymes: No results for input(s): CKTOTAL, CKMB, CKMBINDEX, TROPONINI in the last 168 hours. BNP (last 3 results) No results for input(s): PROBNP in the last 8760 hours. HbA1C: Recent Labs    01/06/20 0235  HGBA1C 7.6*   CBG: Recent Labs  Lab 01/07/20 0603 01/07/20 1117 01/07/20 1614 01/07/20 2124  01/08/20 0612  GLUCAP 140* 226* 120* 174* 138*   Lipid Profile: Recent Labs    01/06/20 0235  CHOL 265*  HDL 56  LDLCALC 196*  TRIG 65  CHOLHDL 4.7   Thyroid Function Tests: No results for input(s): TSH, T4TOTAL, FREET4, T3FREE, THYROIDAB in the last 72 hours. Anemia Panel: No results for input(s): VITAMINB12, FOLATE, FERRITIN, TIBC, IRON, RETICCTPCT in the last 72 hours. Sepsis Labs: No results for input(s): PROCALCITON, LATICACIDVEN in the last 168 hours.  Recent Results (from the past 240 hour(s))  SARS Coronavirus 2 by RT PCR (hospital order, performed in [facility] hospital lab) Nasopharyngeal Nasopharyngeal Swab     Status: None   Collection Time: 01/05/20  9:26 PM   Specimen: Nasopharyngeal Swab  Result Value Ref Range Status   SARS Coronavirus 2 NEGATIVE NEGATIVE Final    Comment: (NOTE) SARS-CoV-2 target nucleic acids are NOT DETECTED.  The SARS-CoV-2 RNA is generally detectable in upper and lower respiratory specimens during the acute phase of infection. The lowest concentration of SARS-CoV-2 viral copies this assay can detect is  250 copies / mL. A negative result does not preclude SARS-CoV-2 infection and should not be used as the sole basis for treatment or other patient management decisions.  A negative result may occur with improper specimen collection / handling, submission of specimen other than nasopharyngeal swab, presence of viral mutation(s) within the areas targeted by this assay, and inadequate number of viral copies (<250 copies / mL). A negative result must be combined with clinical observations, patient history, and epidemiological information.  Fact Sheet for Patients:   BoilerBrush.com.cy  Fact Sheet for Healthcare Providers: https://pope.com/  This test is not yet approved or  cleared by the [place] FDA and has been authorized for detection and/or diagnosis of SARS-CoV-2 by FDA under an  Emergency Use Authorization (EUA).  This EUA will remain in effect (meaning this test can be used) for the duration of the COVID-19 declaration under Section 564(b)(1) of the Act, 21 U.S.C. section 360bbb-3(b)(1), unless the authorization is terminated or revoked sooner.  Performed at [facility], [address], [place], [place] [address]          Radiology Studies: CT ANGIO HEAD W OR WO CONTRAST  Result Date: 01/06/2020 CLINICAL DATA:  Altered mental status. Stroke. Left-sided weakness. EXAM: CT ANGIOGRAPHY HEAD AND NECK TECHNIQUE: Multidetector CT imaging of the head and neck was performed using the standard protocol during bolus administration of intravenous contrast. Multiplanar CT image reconstructions and MIPs were obtained to evaluate the vascular anatomy. Carotid stenosis measurements (when applicable) are obtained utilizing NASCET criteria, using the distal internal carotid diameter as the denominator. CONTRAST:  29mL OMNIPAQUE IOHEXOL 350 MG/ML SOLN COMPARISON:  CT head 01/05/2020.  MRI head and MRA head 01/06/2020 FINDINGS: CTA NECK FINDINGS Aortic arch: 2 vessel arch with bovine branching pattern. Mild atherosclerotic disease in the aortic arch. Proximal great vessels widely patent. Right carotid system: Right common carotid artery widely patent. Medial deviation of the right carotid bifurcation posterior to the pharynx. Atherosclerotic calcification in the proximal right internal carotid artery with 25% diameter stenosis. Left carotid system: Left carotid artery widely patent without stenosis. Mild atherosclerotic disease at the bifurcation. Medial deviation of the carotid bifurcation in the retropharyngeal space. Vertebral arteries: Right vertebral artery is patent to the basilar with mild scattered atherosclerotic disease Non dominant left vertebral artery with diffuse atherosclerotic disease. There are multiple areas of stenosis in the proximal and mid and distal left  vertebral artery which is patent to the basilar. There is moderate to severe stenosis in the distal left vertebral artery at the skull base and in the left V3 and V4 segments. Skeleton: Cervical spondylosis and kyphosis. No acute skeletal abnormality. Other neck: Negative for mass or adenopathy in the neck. Upper chest: Lung apices clear bilaterally. Review of the MIP images confirms the above findings CTA HEAD FINDINGS Anterior circulation: Atherosclerotic calcification in the cavernous carotid bilaterally. Severe stenosis right supraclinoid internal carotid artery and mild to moderate stenosis right cavernous carotid. Left internal carotid artery is patent without stenosis. Hypoplastic right A1 segment. Anterior and middle cerebral arteries are patent bilaterally. Severe stenosis of the superior division of left M1 at the origin. No other significant stenosis in the anterior circulation. Posterior circulation: Severe stenosis in the distal left vertebral artery. Small basilar artery with severe stenosis in the midportion. Fetal origin of the posterior cerebral artery bilaterally. Moderate stenosis of the proximal and mid right posterior cerebral artery. Venous sinuses: Limited venous contrast due to arterial phase scanning. Anatomic variants: None  Review of the MIP images confirms the above findings IMPRESSION: 1. Severe intracranial atherosclerotic disease without acute large vessel occlusion. 2. 25% diameter stenosis proximal right internal carotid artery. No significant left carotid stenosis 3. Non dominant left vertebral artery with diffuse atherosclerotic disease and multiple areas of stenosis. Severe stenosis distal left vertebral artery. Right vertebral artery is disease without significant stenosis. Electronically Signed   By: Marlan Palau M.D.   On: 01/06/2020 14:31   CT ANGIO NECK W OR WO CONTRAST  Result Date: 01/06/2020 CLINICAL DATA:  Altered mental status. Stroke. Left-sided weakness. EXAM: CT  ANGIOGRAPHY HEAD AND NECK TECHNIQUE: Multidetector CT imaging of the head and neck was performed using the standard protocol during bolus administration of intravenous contrast. Multiplanar CT image reconstructions and MIPs were obtained to evaluate the vascular anatomy. Carotid stenosis measurements (when applicable) are obtained utilizing NASCET criteria, using the distal internal carotid diameter as the denominator. CONTRAST:  80mL OMNIPAQUE IOHEXOL 350 MG/ML SOLN COMPARISON:  CT head 01/05/2020.  MRI head and MRA head 01/06/2020 FINDINGS: CTA NECK FINDINGS Aortic arch: 2 vessel arch with bovine branching pattern. Mild atherosclerotic disease in the aortic arch. Proximal great vessels widely patent. Right carotid system: Right common carotid artery widely patent. Medial deviation of the right carotid bifurcation posterior to the pharynx. Atherosclerotic calcification in the proximal right internal carotid artery with 25% diameter stenosis. Left carotid system: Left carotid artery widely patent without stenosis. Mild atherosclerotic disease at the bifurcation. Medial deviation of the carotid bifurcation in the retropharyngeal space. Vertebral arteries: Right vertebral artery is patent to the basilar with mild scattered atherosclerotic disease Non dominant left vertebral artery with diffuse atherosclerotic disease. There are multiple areas of stenosis in the proximal and mid and distal left vertebral artery which is patent to the basilar. There is moderate to severe stenosis in the distal left vertebral artery at the skull base and in the left V3 and V4 segments. Skeleton: Cervical spondylosis and kyphosis. No acute skeletal abnormality. Other neck: Negative for mass or adenopathy in the neck. Upper chest: Lung apices clear bilaterally. Review of the MIP images confirms the above findings CTA HEAD FINDINGS Anterior circulation: Atherosclerotic calcification in the cavernous carotid bilaterally. Severe stenosis  right supraclinoid internal carotid artery and mild to moderate stenosis right cavernous carotid. Left internal carotid artery is patent without stenosis. Hypoplastic right A1 segment. Anterior and middle cerebral arteries are patent bilaterally. Severe stenosis of the superior division of left M1 at the origin. No other significant stenosis in the anterior circulation. Posterior circulation: Severe stenosis in the distal left vertebral artery. Small basilar artery with severe stenosis in the midportion. Fetal origin of the posterior cerebral artery bilaterally. Moderate stenosis of the proximal and mid right posterior cerebral artery. Venous sinuses: Limited venous contrast due to arterial phase scanning. Anatomic variants: None Review of the MIP images confirms the above findings IMPRESSION: 1. Severe intracranial atherosclerotic disease without acute large vessel occlusion. 2. 25% diameter stenosis proximal right internal carotid artery. No significant left carotid stenosis 3. Non dominant left vertebral artery with diffuse atherosclerotic disease and multiple areas of stenosis. Severe stenosis distal left vertebral artery. Right vertebral artery is disease without significant stenosis. Electronically Signed   By: Marlan Palau M.D.   On: 01/06/2020 14:31   MR ANGIO HEAD WO CONTRAST  Result Date: 01/06/2020 CLINICAL DATA:  Infarcts on MRI brain EXAM: MRA HEAD WITHOUT CONTRAST TECHNIQUE: Angiographic images of the Circle of Willis were obtained using MRA technique without  intravenous contrast. COMPARISON:  None. FINDINGS: Motion artifact is present. Intracranial internal carotid arteries are patent with atherosclerotic irregularity and at least moderate stenosis of the supraclinoid portion. Middle and anterior cerebral arteries are patent. Right A1 ACA is congenitally small or stenotic. Proximal left M2 MCA moderate to marked stenosis. Intracranial vertebral arteries, basilar artery, posterior cerebral  arteries are patent. Moderate to marked stenosis of the mid basilar. Bilateral PCA atherosclerotic irregularity with right P2 stenosis. Bilateral posterior communicating arteries are present. There is no aneurysm. IMPRESSION: No proximal intracranial vessel occlusion. Multifocal atherosclerotic irregularity and stenoses. Electronically Signed   By: Guadlupe SpanishPraneil  Patel M.D.   On: 01/06/2020 11:20   ECHOCARDIOGRAM COMPLETE  Result Date: 01/06/2020    ECHOCARDIOGRAM REPORT   Patient Name:   Angeline SlimMOLLY L Daley Date of Exam: 01/06/2020 Medical Rec #:  [id number]      Height:       61.0 in Accession #:    [id number]     Weight:       174.4 lb Date of Birth:  06/04/1941       BSA:          1.782 m Patient Age:    78 years       BP:           179/70 mmHg Patient Gender: F              HR:           88 bpm. Exam Location:  Inpatient Procedure: 2D Echo Indications:     Stroke 434.91 / I163.9  History:         Patient has prior history of Echocardiogram examinations, most                  recent 01/28/2017. TIA; Risk Factors:Hypertension and Diabetes.                  Thyroid disease.  Sonographer:     Leta Junglingiffany Cooper RDCS Referring Phys:  [id number] Heywood IlesJARED M GARDNER Diagnosing Phys: Armanda Magicraci Turner MD  Sonographer Comments: No IV access. IMPRESSIONS  1. There is a mid cavitary resting gradient of 21mmHg due to hyperdynamic LVF.     Marland Kitchen. Left ventricular ejection fraction, by estimation, is 65 to 70%. The left ventricle has normal function. The left ventricle has no regional wall motion abnormalities. Left ventricular diastolic parameters are consistent with Grade I diastolic dysfunction (impaired relaxation). Elevated left ventricular end-diastolic pressure.  2. Right ventricular systolic function is normal. The right ventricular size is normal. Tricuspid regurgitation signal is inadequate for assessing PA pressure.  3. The mitral valve is normal in structure. No evidence of mitral valve regurgitation. Mild mitral stenosis. The mean mitral valve  gradient is 6.5 mmHg.  4. The aortic valve is tricuspid. Aortic valve regurgitation is not visualized. Mild to moderate aortic valve sclerosis/calcification is present, without any evidence of aortic stenosis. Aortic valve mean gradient measures 9.0 mmHg. Aortic valve Vmax measures 1.97 m/s.  5. Aortic dilatation noted. 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  6. The inferior vena cava is normal in size with greater than 50% respiratory variability, suggesting right atrial pressure of 3 mmHg. FINDINGS  Left Ventricle: There is a mid cavitary resting gradient of 21mmHg due to hyperdynamic LVF. Left ventricular ejection fraction, by estimation, is 65 to 70%. The left ventricle has normal function. The left ventricle has no regional wall motion abnormalities. The left ventricular internal cavity size  was normal in size. There is no left ventricular hypertrophy. Left ventricular diastolic parameters are consistent with Grade I diastolic dysfunction (impaired relaxation). Elevated left ventricular end-diastolic pressure. Right Ventricle: The right ventricular size is normal. No increase in right ventricular wall thickness. Right ventricular systolic function is normal. Tricuspid regurgitation signal is inadequate for assessing PA pressure. Left Atrium: Left atrial size was normal in size. Right Atrium: Right atrial size was normal in size. Pericardium: There is no evidence of pericardial effusion. Mitral Valve: The mitral valve is normal in structure. Normal mobility of the mitral valve leaflets. Severe mitral annular calcification. No evidence of mitral valve regurgitation. Mild mitral valve stenosis. MV peak gradient, 18.6 mmHg. The mean mitral valve gradient is 6.5 mmHg. Tricuspid Valve: The tricuspid valve is normal in structure. Tricuspid valve regurgitation is not demonstrated. No evidence of tricuspid stenosis. Aortic Valve: The aortic valve is tricuspid. Aortic valve regurgitation is not  visualized. Mild to moderate aortic valve sclerosis/calcification is present, without any evidence of aortic stenosis. Aortic valve mean gradient measures 9.0 mmHg. Aortic valve peak gradient measures 15.5 mmHg. Aortic valve area, by VTI measures 1.06 cm. Pulmonic Valve: The pulmonic valve was normal in structure. Pulmonic valve regurgitation is trivial. No evidence of pulmonic stenosis. Aorta: Aortic dilatation noted. 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 Venous: The inferior vena cava was not well visualized. The inferior vena cava is normal in size with greater than 50% respiratory variability, suggesting right atrial pressure of 3 mmHg. IAS/Shunts: No atrial level shunt detected by color flow Doppler.  LEFT VENTRICLE PLAX 2D LVIDd:         3.80 cm  Diastology LVIDs:         2.60 cm  LV e' lateral:   4.46 cm/s LV PW:         1.00 cm  LV E/e' lateral: 26.2 LV IVS:        1.10 cm  LV e' medial:    4.13 cm/s LVOT diam:     1.60 cm  LV E/e' medial:  28.3 LV SV:         38 LV SV Index:   21 LVOT Area:     2.01 cm  RIGHT VENTRICLE TAPSE (M-mode): 1.6 cm LEFT ATRIUM             Index LA diam:        3.10 cm 1.74 cm/m LA Vol (A2C):   30.1 ml 16.89 ml/m LA Vol (A4C):   29.3 ml 16.44 ml/m LA Biplane Vol: 34.9 ml 19.59 ml/m  AORTIC VALVE AV Area (Vmax):    1.30 cm AV Area (Vmean):   1.34 cm AV Area (VTI):     1.06 cm AV Vmax:           197.00 cm/s AV Vmean:          135.000 cm/s AV VTI:            0.355 m AV Peak Grad:      15.5 mmHg AV Mean Grad:      9.0 mmHg LVOT Vmax:         127.00 cm/s LVOT Vmean:        89.900 cm/s LVOT VTI:          0.187 m LVOT/AV VTI ratio: 0.53  AORTA Ao Root diam: 3.40 cm MITRAL VALVE MV Area (PHT): 2.48 cm     SHUNTS MV Peak grad:  18.6 mmHg  Systemic VTI:  0.19 m MV Mean grad:  6.5 mmHg     Systemic Diam: 1.60 cm MV Vmax:       2.16 m/s MV Vmean:      117.5 cm/s MV Decel Time: 306 msec MV E velocity: 117.00 cm/s MV A velocity: 209.00 cm/s MV E/A ratio:   0.56 Fransico Him MD Electronically signed by Fransico Him MD Signature Date/Time: 01/06/2020/8:59:43 AM    Final (Updated)         Scheduled Meds: . amLODipine  10 mg Oral Daily  . aspirin  300 mg Rectal Daily   Or  . aspirin  325 mg Oral Daily  . clopidogrel  75 mg Oral Daily  . enoxaparin (LOVENOX) injection  40 mg Subcutaneous Q24H  . insulin aspart  0-5 Units Subcutaneous QHS  . insulin aspart  0-9 Units Subcutaneous TID WC  . levothyroxine  88 mcg Oral QAC breakfast  . losartan  50 mg Oral QHS  . sodium chloride flush  3 mL Intravenous Once   Continuous Infusions:   LOS: 2 days    Time spent:35 mins. More than 50% of that time was spent in counseling and/or coordination of care.      Shelly Coss, MD [facility] P6/24/2021, 8:26 AM

## 2020-01-09 LAB — GLUCOSE, CAPILLARY
Glucose-Capillary: 161 mg/dL — ABNORMAL HIGH (ref 70–99)
Glucose-Capillary: 156 mg/dL — ABNORMAL HIGH (ref 70–99)
Glucose-Capillary: 153 mg/dL — ABNORMAL HIGH (ref 70–99)
Glucose-Capillary: 125 mg/dL — ABNORMAL HIGH (ref 70–99)

## 2020-01-09 NOTE — Progress Notes (Signed)
Physical Therapy Treatment Patient Details Name: Jill Rose MRN: [id number] DOB: 08-04-1940 Today's Date: 01/09/2020    History of Present Illness Pt is a 79 y/o female admitted secondary to worsening L sided weakness. 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 PMH includes HTN.     PT Comments    Pt OOB in recliner upon arrival of PT, agreeable to session with focus on progression of pivot transfers and increased foot clearance to progress ambulation. The pt was able to complete multiple sit-stand transfers through the session, but required increased assist by the end of session due to onset of fatigue and decreased ability to correct posterior lean without assist. The pt completed pivot transfers from bed-chair x2 through session, but required modA of 2 to complete due to difficulty with wt shift, RW management, and maintaining upright posture. The pt will continue to benefit from intensive therapies to improve functional strength, endurance, and stability acutely and following d/c to facilitate return to safety and independence with mobility.     Follow Up Recommendations  CIR     Equipment Recommendations   (defer to post acute)    Recommendations for Other Services       Precautions / Restrictions Precautions Precautions: Fall Restrictions Weight Bearing Restrictions: No    Mobility  Bed Mobility Overal bed mobility: Needs Assistance             General bed mobility comments: pt OOB in recliner chair upon arrival  Transfers Overall transfer level: Needs assistance Equipment used: Rolling walker (2 wheeled) Transfers: Sit to/from [facility] Sit to Stand: Mod assist;+2 physical assistance Stand pivot transfers: Mod assist;+2 physical assistance       General transfer comment: min/modA of 2 (increased assist with reps as pt fatigues), VC each time for hand positioning, sig posterior lean with BLE against chair or  bed despite cues. modA of 2 to complete pivot as pt benefits from assist to facilitate wt shift, cues for each step, and assist with RW. Pt also with progressively worsening posterior lean through movement  Ambulation/Gait Ambulation/Gait assistance: Mod assist;+2 physical assistance Gait Distance (Feet): 3 Feet Assistive device: Rolling walker (2 wheeled) Gait Pattern/deviations: Step-to pattern;Decreased stride length;Decreased weight shift to left Gait velocity: decreased   General Gait Details: pt able to take a few very small side steps with RW and modA of 2. pt with improved ability to clear LLE initially, unable to clear LLE by end of session due to fatigue, pt unable to lift RLE despite assist with wt shift      Modified Rankin (Stroke Patients Only) Modified Rankin (Stroke Patients Only) Pre-Morbid Rankin Score: No symptoms Modified Rankin: Moderately severe disability     Balance Overall balance assessment: Needs assistance Sitting-balance support: No upper extremity supported;Feet supported Sitting balance-Leahy Scale: Good     Standing balance support: Bilateral upper extremity supported;During functional activity Standing balance-Leahy Scale: Poor Standing balance comment: Reliant on UE and external support, strong posterior lean with fatigue                            Cognition Arousal/Alertness: Awake/alert Behavior During Therapy: WFL for tasks assessed/performed Overall Cognitive Status: Impaired/Different from baseline Area of Impairment: Memory;Safety/judgement;Problem solving                     Memory: Decreased short-term memory   Safety/Judgement: Decreased awareness of deficits;Decreased awareness of  safety   Problem Solving: Slow processing;Difficulty sequencing;Requires verbal cues;Requires tactile cues General Comments: Pt requires VC for sequencing and mobility, especially in regards to safety and positioning. pt benefits from  repeated cues for coming forward rather than strong posterior lean that onsets with mobility. Seems to fatigue quickly requireing increased cues and assist      Exercises Other Exercises Other Exercises: 10x sit<>stand from EOB and chair through session. modA of 2 to minA of 2 due to posterior lean Other Exercises: standing lateral weight shifting activity with bilateral UEs on RW and min A for stability Other Exercises: chair push ups x5. focus on sequencing and pushing through b ues, also bringing trunk upright.    General Comments General comments (skin integrity, edema, etc.): pt daughter initially present, sister present throughout. family invested and supportive.      Pertinent Vitals/Pain Pain Assessment: No/denies pain Pain Intervention(s): Monitored during session;Repositioned           PT Goals (current goals can now be found in the care plan section) Acute Rehab PT Goals Patient Stated Goal: be able to get up to a toilet PT Goal Formulation: With patient Time For Goal Achievement: 01/20/20 Potential to Achieve Goals: Good Progress towards PT goals: Progressing toward goals    Frequency    Min 5X/week      PT Plan Current plan remains appropriate       AM-PAC PT "6 Clicks" Mobility   Outcome Measure  Help needed turning from your back to your side while in a flat bed without using bedrails?: A Little Help needed moving from lying on your back to sitting on the side of a flat bed without using bedrails?: A Lot Help needed moving to and from a bed to a chair (including a wheelchair)?: A Lot Help needed standing up from a chair using your arms (e.g., wheelchair or bedside chair)?: A Lot Help needed to walk in hospital room?: A Lot Help needed climbing 3-5 steps with a railing? : Total 6 Click Score: 12    End of Session Equipment Utilized During Treatment: Gait belt Activity Tolerance: Patient tolerated treatment well Patient left: in chair;with call  bell/phone within reach;with chair alarm set;with family/visitor present Nurse Communication: Mobility status PT Visit Diagnosis: Unsteadiness on feet (R26.81);Difficulty in walking, not elsewhere classified (R26.2);Hemiplegia and hemiparesis Hemiplegia - Right/Left: Left Hemiplegia - caused by: Cerebral infarction     Time: 1202-1226 PT Time Calculation (min) (ACUTE ONLY): 24 min  Charges:  $Gait Training: 8-22 mins $Neuromuscular Re-education: 8-22 mins                     Jill Rose, PT, DPT   Acute Rehabilitation Department Pager #: [phone]   Gaetana Michaelis 01/09/2020, 3:32 PM

## 2020-01-09 NOTE — Progress Notes (Signed)
Inpatient Rehabilitation-Admissions Coordinator   Still waiting on a determination from insurance regarding the request for CIR. Will follow up with pt Monday if she remains in house.   Cheri Rous, OTR/L  Rehab Admissions Coordinator  [phone] 01/09/2020 5:15 PM

## 2020-01-09 NOTE — Progress Notes (Addendum)
Occupational Therapy Treatment Patient Details Name: Jill Rose MRN: [id number] DOB: Aug 29, 1940 Today's Date: 01/09/2020    History of present illness Pt is a 79 y/o female admitted secondary to worsening L sided weakness. 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 PMH includes HTN.    OT comments  Pt. Seen for skilled OT treatment session. Dtr. Present for session and very active with wanting to learn and ask questions for preparation for how to manage mothers care at home.  Focus of session sequencing and safety with sit/stand in preparation for functional mobility and transfers ie: bsc and toileting tasks.  Pt. Able to perform 5 sit/stands with focus on hand placement on arm rests into standing and also when returning to sitting.  Attempted pivot steps for transfer unable to initiate pivotal steps safely this day.    Pt.s dtr. Seen video taping me assisting her mother with sit/stand. Politely explained that she had not obtained permission.  She apologized and states she deleted the video.  Explained it was in attempt to learn and make sure she understood how to do things with her mother safely at home.  She requests handouts and video links if available to serve the same purpose she was trying to achieve with the video.  Passed along her request to PT who was going to be seeing her next.  Also answered questions about CIR therapies.    Follow Up Recommendations  CIR    Equipment Recommendations  Other (comment)    Recommendations for Other Services Rehab consult    Precautions / Restrictions Precautions Precautions: Fall       Mobility Bed Mobility               General bed mobility comments: pt OOB in recliner chair upon arrival  Transfers   Equipment used: Rolling walker (2 wheeled) Transfers: Sit to/from [facility] Sit to Stand: Mod assist Stand pivot transfers:  (unable to initate steps to pivot)       General  transfer comment: max cueing needed for technique and safe hand placement on arm rests.  tendancy to pull on rw when transitioning into sitting and also standing    Balance                                           ADL either performed or assessed with clinical judgement   ADL Overall ADL's : Needs assistance/impaired                                             Vision       Perception     Praxis      Cognition Arousal/Alertness: Awake/alert Behavior During Therapy: WFL for tasks assessed/performed Overall Cognitive Status: Impaired/Different from baseline                                          Exercises Other Exercises Other Exercises: chair push ups x5. focus on sequencing and pushing through b ues, also bringing trunk upright.   Shoulder Instructions       General Comments      Pertinent Vitals/ Pain  Pain Assessment: No/denies pain  Home Living                                          Prior Functioning/Environment              Frequency  Min 2X/week        Progress Toward Goals  OT Goals(current goals can now be found in the care plan section)  Progress towards OT goals: Progressing toward goals     Plan Discharge plan remains appropriate    Co-evaluation                 AM-PAC OT "6 Clicks" Daily Activity     Outcome Measure   Help from another person eating meals?: None Help from another person taking care of personal grooming?: A Little Help from another person toileting, which includes using toliet, bedpan, or urinal?: A Lot Help from another person bathing (including washing, rinsing, drying)?: A Lot Help from another person to put on and taking off regular upper body clothing?: A Lot Help from another person to put on and taking off regular lower body clothing?: A Lot 6 Click Score: 15    End of Session Equipment Utilized During Treatment: Gait  belt;Rolling walker  OT Visit Diagnosis: Unsteadiness on feet (R26.81);Other abnormalities of gait and mobility (R26.89);Pain;Hemiplegia and hemiparesis;Ataxia, unspecified (R27.0) Hemiplegia - dominant/non-dominant: Non-Dominant   Activity Tolerance Patient tolerated treatment well   Patient Left in chair;with call bell/phone within reach;with family/visitor present;Other (comment)   Nurse Communication Other (comment) (dtr. requests pur wik for her mother at night)        Time: 4235-3614 OT Time Calculation (min): 24 min  Charges: OT General Charges $OT Visit: 1 Visit OT Treatments $Self Care/Home Management : 23-37 min.  Boneta Lucks, COTA/L Acute Rehabilitation [phone]   Robet Leu 01/09/2020, 12:23 PM

## 2020-01-09 NOTE — Progress Notes (Signed)
PROGRESS NOTE    Jill Rose  CHE:[id number] DOB: Jul 24, 1940 DOA: 01/05/2020 PCP: Jilda Panda, MD   Brief Narrative:  Patient is a 79 year old female with history of hypertension, diabetes type 2, hypothyroidism, history of TIA who presented with left-sided weakness.  She had weakness more on the left lower extremity than the upper.  It had been ongoing for last 1 week.  She also feels that her speech is slurred.  She had difficulty getting up the stairs due to weakness of the leg.  CT head on presentation showed old left basal ganglia strokes.  MRI of the brain showed acute/subacute nonhemorrhagic right-sided infarct.  Patient admitted for stroke work-up.  Neurology consulted. Work up completed. Physical therapy recommended inpatient rehab.  Patient is medically stable for discharge to CIR as soon as bed is available.  Assessment & Plan:   Principal Problem:   Acute ischemic stroke [facility]) Active Problems:   Hypothyroidism   Essential hypertension   Acute CVA (cerebrovascular accident) ([place])   Controlled type 2 diabetes mellitus with hyperglycemia ([place])   Morbid obesity ([place])   Leukocytosis   History of TIA (transient ischemic attack)   Acute ischemic stroke: MRI showed acute/subacute nonhemorrhagic infarcts involving the right corona radiata and posterior right temporal and occipital lobes,remote lacunar infarcts of the corona radiata bilaterally, left thalamus, and left paramedian pons. MRA showed multifocal atherosclerosis no proximal occlusion.  CT head and neck showed severe intracranial atherosclerosis without large vessel occlusion.  Proximal ICA has 25% stenosis. PT/OT recommending CIR. LDL of 196.  Hemoglobin A1c is 7.6 CT angio head and neck showed severe intracranial atherosclerotic disease without acute large vessel occlusion,25% diameter stenosis proximal right internal carotid artery. No significant left carotid stenosis,non dominant left vertebral artery with diffuse  atherosclerotic disease and multiple areas of stenosis. Severe stenosis distal left vertebral artery. Echocardiogram showed left ventricular ejection fraction of 65 to 53%, grade 1 diastolic dysfunction, no atrial level shunt, no cardiac source of emboli Started on aspirin and Plavix.  Neurology recommending aspirin 325 mg daily and Plavix for 3 months then Plavix alone.  Started on Lipitor She doesnot have any focal neurological deficits now.  Hypertension:BP stable now. She is on losartan at home.    Monitor blood pressure.Added amlodipine 10 mg.  Hyperlipidemia: LDL of 196.  Started on Lipitor 80 mg daily  Hypothyroidism: Continue Synthyroid  Diabetes type 2: Not on any medications at home.  Hemoglobin A1c was 7.6.  Continue sliding-scale insulin here.  We will put her on Metformin on discharge.        DVT prophylaxis:Lovenox Code Status: Full Family Communication:Daughter  present at the bedside Status is: Inpatient   Dispo: The patient is from: Home              Anticipated d/c is to: CIR              Anticipated d/c date is: On the day of bed availability at [facility] Patient is medically stable for DC as soon as bed is available.                  Consultants: Neurology  Procedures:None   Antimicrobials:  Anti-infectives (From admission, onward)   None      Subjective:  Patient seen and examined at the bedside this morning. Comfortable. Hemodynamically stable. No active issues. Objective: Vitals:   01/08/20 1127 01/08/20 1538 01/09/20 0032 01/09/20 0617  BP: (!) 172/72 (!) 128/59 (!) 139/54 (!) 145/62  Pulse: 90  87 77 82  Resp: 18 18 17 17   Temp: 98.1 F (36.7 C) 97.9 F (36.6 C) 98 F (36.7 C) 98.1 F (36.7 C)  TempSrc: Oral Oral Oral Oral  SpO2: 98% 100% 100% 97%    Intake/Output Summary (Last 24 hours) at 01/09/2020 01/11/2020 Last data filed at 01/08/2020 1214 Gross per 24 hour  Intake 240 ml  Output --  Net 240 ml   There were no vitals filed for this  visit.  Examination:   General exam: Comfortable,lying on bed HEENT:PERRL,Oral mucosa moist, Ear/Nose normal on gross exam Respiratory system: Bilateral equal air entry, normal vesicular breath sounds, no wheezes or crackles  Cardiovascular system: S1 & S2 heard, RRR. No JVD, murmurs, rubs, gallops or clicks. Gastrointestinal system: Abdomen is nondistended, soft and nontender. No organomegaly or masses felt. Normal bowel sounds heard. Central nervous system: Alert and oriented. No focal neurological deficits. Extremities: No edema, no clubbing ,no cyanosis, distal peripheral pulses palpable. Skin: No rashes, lesions or ulcers,no icterus ,no pallor    Data Reviewed: I have personally reviewed following labs and imaging studies  CBC: Recent Labs  Lab 01/05/20 1401  WBC 11.1*  NEUTROABS 6.5  HGB 13.0  HCT 41.7  MCV 87.4  PLT 258   Basic Metabolic Panel: Recent Labs  Lab 01/05/20 1401 01/06/20 0842  NA 132* 137  K 5.3* 4.3  CL 98 102  CO2 22 24  GLUCOSE 135* 161*  BUN 15 13  CREATININE 0.78 0.72  CALCIUM 9.3 9.1   GFR: CrCl cannot be calculated (Unknown ideal weight.). Liver Function Tests: Recent Labs  Lab 01/05/20 1401  AST 18  ALT 12  ALKPHOS 57  BILITOT 0.3  PROT 8.0  ALBUMIN 3.5   No results for input(s): LIPASE, AMYLASE in the last 168 hours. No results for input(s): AMMONIA in the last 168 hours. Coagulation Profile: Recent Labs  Lab 01/05/20 1401  INR 1.0   Cardiac Enzymes: No results for input(s): CKTOTAL, CKMB, CKMBINDEX, TROPONINI in the last 168 hours. BNP (last 3 results) No results for input(s): PROBNP in the last 8760 hours. HbA1C: No results for input(s): HGBA1C in the last 72 hours. CBG: Recent Labs  Lab 01/08/20 0612 01/08/20 1112 01/08/20 1512 01/08/20 2158 01/09/20 0618  GLUCAP 138* 144* 139* 158* 161*   Lipid Profile: No results for input(s): CHOL, HDL, LDLCALC, TRIG, CHOLHDL, LDLDIRECT in the last 72 hours. Thyroid  Function Tests: No results for input(s): TSH, T4TOTAL, FREET4, T3FREE, THYROIDAB in the last 72 hours. Anemia Panel: No results for input(s): VITAMINB12, FOLATE, FERRITIN, TIBC, IRON, RETICCTPCT in the last 72 hours. Sepsis Labs: No results for input(s): PROCALCITON, LATICACIDVEN in the last 168 hours.  Recent Results (from the past 240 hour(s))  SARS Coronavirus 2 by RT PCR (hospital order, performed in [facility] hospital lab) Nasopharyngeal Nasopharyngeal Swab     Status: None   Collection Time: 01/05/20  9:26 PM   Specimen: Nasopharyngeal Swab  Result Value Ref Range Status   SARS Coronavirus 2 NEGATIVE NEGATIVE Final    Comment: (NOTE) SARS-CoV-2 target nucleic acids are NOT DETECTED.  The SARS-CoV-2 RNA is generally detectable in upper and lower respiratory specimens during the acute phase of infection. The lowest concentration of SARS-CoV-2 viral copies this assay can detect is 250 copies / mL. A negative result does not preclude SARS-CoV-2 infection and should not be used as the sole basis for treatment or other patient management decisions.  A negative result may occur with improper  specimen collection / handling, submission of specimen other than nasopharyngeal swab, presence of viral mutation(s) within the areas targeted by this assay, and inadequate number of viral copies (<250 copies / mL). A negative result must be combined with clinical observations, patient history, and epidemiological information.  Fact Sheet for Patients:   BoilerBrush.com.cy  Fact Sheet for Healthcare Providers: https://pope.com/  This test is not yet approved or  cleared by the [place] FDA and has been authorized for detection and/or diagnosis of SARS-CoV-2 by FDA under an Emergency Use Authorization (EUA).  This EUA will remain in effect (meaning this test can be used) for the duration of the COVID-19 declaration under Section 564(b)(1)  of the Act, 21 U.S.C. section 360bbb-3(b)(1), unless the authorization is terminated or revoked sooner.  Performed at [facility], [address], [place], [place] [address]          Radiology Studies: No results found.      Scheduled Meds: . amLODipine  10 mg Oral Daily  . aspirin  300 mg Rectal Daily   Or  . aspirin  325 mg Oral Daily  . atorvastatin  80 mg Oral Daily  . clopidogrel  75 mg Oral Daily  . enoxaparin (LOVENOX) injection  40 mg Subcutaneous Q24H  . fluticasone  1 spray Each Nare Daily  . insulin aspart  0-5 Units Subcutaneous QHS  . insulin aspart  0-9 Units Subcutaneous TID WC  . levothyroxine  88 mcg Oral QAC breakfast  . losartan  50 mg Oral QHS  . sodium chloride flush  3 mL Intravenous Once   Continuous Infusions:   LOS: 3 days    Time spent:35 mins. More than 50% of that time was spent in counseling and/or coordination of care.      Burnadette Pop, MD [facility] P6/25/2021, 8:03 AM

## 2020-01-10 LAB — GLUCOSE, CAPILLARY
Glucose-Capillary: 143 mg/dL — ABNORMAL HIGH (ref 70–99)
Glucose-Capillary: 166 mg/dL — ABNORMAL HIGH (ref 70–99)
Glucose-Capillary: 256 mg/dL — ABNORMAL HIGH (ref 70–99)
Glucose-Capillary: 127 mg/dL — ABNORMAL HIGH (ref 70–99)

## 2020-01-10 NOTE — Progress Notes (Signed)
PROGRESS NOTE    Jill Rose  ZOX:[id number] DOB: 06/04/1941 DOA: 01/05/2020 PCP: Ralene OkMoreira, Roy, MD   Brief Narrative:  Patient is a 79 year old female with history of hypertension, diabetes type 2, hypothyroidism, history of TIA who presented with left-sided weakness.  She had weakness more on the left lower extremity than the upper.  It had been ongoing for last 1 week.  She also feels that her speech is slurred.  She had difficulty getting up the stairs due to weakness of the leg.  CT head on presentation showed old left basal ganglia strokes.  MRI of the brain showed acute/subacute nonhemorrhagic right-sided infarct.  Patient admitted for stroke work-up.  Neurology consulted. Work up completed. Physical therapy recommended inpatient rehab.  Patient is medically stable for discharge to CIR as soon as bed is available   Assessment & Plan:   Principal Problem:   Acute ischemic stroke [facility](HCC) Active Problems:   Hypothyroidism   Essential hypertension   Acute CVA (cerebrovascular accident) (HCC)   Controlled type 2 diabetes mellitus with hyperglycemia (HCC)   Morbid obesity (HCC)   Leukocytosis   History of TIA (transient ischemic attack)   Acute ischemic stroke: MRI showed acute/subacute nonhemorrhagic infarcts involving the right corona radiata and posterior right temporal and occipital lobes,remote lacunar infarcts of the corona radiata bilaterally, left thalamus, and left paramedian pons. MRA showed multifocal atherosclerosis no proximal occlusion.  CT head and neck showed severe intracranial atherosclerosis without large vessel occlusion.  Proximal ICA has 25% stenosis. PT/OT recommending CIR. LDL of 196.  Hemoglobin A1c is 7.6 CT angio head and neck showed severe intracranial atherosclerotic disease without acute large vessel occlusion,25% diameter stenosis proximal right internal carotid artery. No significant left carotid stenosis,non dominant left vertebral artery with diffuse  atherosclerotic disease and multiple areas of stenosis. Severe stenosis distal left vertebral artery. Echocardiogram showed left ventricular ejection fraction of 65 to 70%, grade 1 diastolic dysfunction, no atrial level shunt, no cardiac source of emboli Started on aspirin and Plavix.  Neurology recommending aspirin 325 mg daily and Plavix for 3 months then Plavix alone.  Started on Lipitor She doesnot have any focal neurological deficits now.  Hypertension:BP stable now. She is on losartan at home.    Monitor blood pressure.Added amlodipine 10 mg.  Hyperlipidemia: LDL of 196.  Started on Lipitor 80 mg daily  Hypothyroidism: Continue Synthyroid  Diabetes type 2: Not on any medications at home.  Hemoglobin A1c was 7.6.  Continue sliding-scale insulin here.  We will put her on Metformin on discharge.    DVT prophylaxis:Lovenox Code Status: Full Family Communication:Daughter  present at the bedside Status is: Inpatient   Dispo: The patient is from: Home  Anticipated d/c is to: CIR  Anticipated [facility]/c date is: [facility] Patient is medically stable for DC as soon as bed is available.      Code Status Orders  (From admission, onward)         Start     Ordered   01/05/20 2216  Full code  Continuous        01/05/20 2219        Code Status History    Date Active Date Inactive Code Status Order ID Comments User Context   01/26/2017 1942 01/28/2017 1918 Full Code [id number]  Lorretta HarpNiu, Xilin, MD ED   Advance Care Planning Activity        Consults called: neuro Admission status: Inpatient   Consultants:  as above  Procedures:  CT ANGIO HEAD W OR WO CONTRAST  Result Date: 01/06/2020 CLINICAL DATA:  Altered mental status. Stroke. Left-sided weakness. EXAM: CT ANGIOGRAPHY HEAD AND NECK TECHNIQUE: Multidetector CT imaging of the head and neck was performed using the standard protocol during bolus administration of  intravenous contrast. Multiplanar CT image reconstructions and MIPs were obtained to evaluate the vascular anatomy. Carotid stenosis measurements (when applicable) are obtained utilizing NASCET criteria, using the distal internal carotid diameter as the denominator. CONTRAST:  41mL OMNIPAQUE IOHEXOL 350 MG/ML SOLN COMPARISON:  CT head 01/05/2020.  MRI head and MRA head 01/06/2020 FINDINGS: CTA NECK FINDINGS Aortic arch: 2 vessel arch with bovine branching pattern. Mild atherosclerotic disease in the aortic arch. Proximal great vessels widely patent. Right carotid system: Right common carotid artery widely patent. Medial deviation of the right carotid bifurcation posterior to the pharynx. Atherosclerotic calcification in the proximal right internal carotid artery with 25% diameter stenosis. Left carotid system: Left carotid artery widely patent without stenosis. Mild atherosclerotic disease at the bifurcation. Medial deviation of the carotid bifurcation in the retropharyngeal space. Vertebral arteries: Right vertebral artery is patent to the basilar with mild scattered atherosclerotic disease Non dominant left vertebral artery with diffuse atherosclerotic disease. There are multiple areas of stenosis in the proximal and mid and distal left vertebral artery which is patent to the basilar. There is moderate to severe stenosis in the distal left vertebral artery at the skull base and in the left V3 and V4 segments. Skeleton: Cervical spondylosis and kyphosis. No acute skeletal abnormality. Other neck: Negative for mass or adenopathy in the neck. Upper chest: Lung apices clear bilaterally. Review of the MIP images confirms the above findings CTA HEAD FINDINGS Anterior circulation: Atherosclerotic calcification in the cavernous carotid bilaterally. Severe stenosis right supraclinoid internal carotid artery and mild to moderate stenosis right cavernous carotid. Left internal carotid artery is patent without stenosis.  Hypoplastic right A1 segment. Anterior and middle cerebral arteries are patent bilaterally. Severe stenosis of the superior division of left M1 at the origin. No other significant stenosis in the anterior circulation. Posterior circulation: Severe stenosis in the distal left vertebral artery. Small basilar artery with severe stenosis in the midportion. Fetal origin of the posterior cerebral artery bilaterally. Moderate stenosis of the proximal and mid right posterior cerebral artery. Venous sinuses: Limited venous contrast due to arterial phase scanning. Anatomic variants: None Review of the MIP images confirms the above findings IMPRESSION: 1. Severe intracranial atherosclerotic disease without acute large vessel occlusion. 2. 25% diameter stenosis proximal right internal carotid artery. No significant left carotid stenosis 3. Non dominant left vertebral artery with diffuse atherosclerotic disease and multiple areas of stenosis. Severe stenosis distal left vertebral artery. Right vertebral artery is disease without significant stenosis. Electronically Signed   By: Marlan Palau M.D.   On: 01/06/2020 14:31   DG Chest 2 View  Result Date: 01/05/2020 CLINICAL DATA:  Acute ischemic stroke. EXAM: CHEST - 2 VIEW COMPARISON:  10/17/2013 FINDINGS: Lower lung volumes from prior exam. There are patchy opacities at the left greater than right lung base. Mild cardiomegaly. Aortic atherosclerosis. No significant pleural effusion. There may be minimal fluid in the fissures. No pneumothorax. No acute osseous abnormalities are seen. IMPRESSION: 1. Patchy bibasilar opacities, left greater than right, may be atelectasis or pneumonia. 2. Mild cardiomegaly.  Aortic Atherosclerosis (ICD10-I70.0). Electronically Signed   By: Narda Rutherford M.D.   On: 01/05/2020 22:43   CT HEAD WO CONTRAST  Result Date:  01/05/2020 CLINICAL DATA:  Increased left leg weakness. EXAM: CT HEAD WITHOUT CONTRAST TECHNIQUE: Contiguous axial images  were obtained from the base of the skull through the vertex without intravenous contrast. COMPARISON:  January 26, 2017 FINDINGS: Brain: There is mild cerebral atrophy with widening of the extra-axial spaces and ventricular dilatation. There are areas of decreased attenuation within the white matter tracts of the supratentorial brain, consistent with microvascular disease changes. Small chronic left basal ganglia lacunar infarcts are noted. Vascular: No hyperdense vessel or unexpected calcification. Skull: Normal. Negative for fracture or focal lesion. Sinuses/Orbits: There is mild sphenoid sinus mucosal thickening. Other: None. IMPRESSION: 1. Generalized cerebral atrophy. 2. Small chronic left basal ganglia lacunar infarcts. 3. No acute intracranial abnormality. Electronically Signed   By: Virgina Norfolk M.D.   On: 01/05/2020 15:42   CT ANGIO NECK W OR WO CONTRAST  Result Date: 01/06/2020 CLINICAL DATA:  Altered mental status. Stroke. Left-sided weakness. EXAM: CT ANGIOGRAPHY HEAD AND NECK TECHNIQUE: Multidetector CT imaging of the head and neck was performed using the standard protocol during bolus administration of intravenous contrast. Multiplanar CT image reconstructions and MIPs were obtained to evaluate the vascular anatomy. Carotid stenosis measurements (when applicable) are obtained utilizing NASCET criteria, using the distal internal carotid diameter as the denominator. CONTRAST:  61mL OMNIPAQUE IOHEXOL 350 MG/ML SOLN COMPARISON:  CT head 01/05/2020.  MRI head and MRA head 01/06/2020 FINDINGS: CTA NECK FINDINGS Aortic arch: 2 vessel arch with bovine branching pattern. Mild atherosclerotic disease in the aortic arch. Proximal great vessels widely patent. Right carotid system: Right common carotid artery widely patent. Medial deviation of the right carotid bifurcation posterior to the pharynx. Atherosclerotic calcification in the proximal right internal carotid artery with 25% diameter stenosis. Left  carotid system: Left carotid artery widely patent without stenosis. Mild atherosclerotic disease at the bifurcation. Medial deviation of the carotid bifurcation in the retropharyngeal space. Vertebral arteries: Right vertebral artery is patent to the basilar with mild scattered atherosclerotic disease Non dominant left vertebral artery with diffuse atherosclerotic disease. There are multiple areas of stenosis in the proximal and mid and distal left vertebral artery which is patent to the basilar. There is moderate to severe stenosis in the distal left vertebral artery at the skull base and in the left V3 and V4 segments. Skeleton: Cervical spondylosis and kyphosis. No acute skeletal abnormality. Other neck: Negative for mass or adenopathy in the neck. Upper chest: Lung apices clear bilaterally. Review of the MIP images confirms the above findings CTA HEAD FINDINGS Anterior circulation: Atherosclerotic calcification in the cavernous carotid bilaterally. Severe stenosis right supraclinoid internal carotid artery and mild to moderate stenosis right cavernous carotid. Left internal carotid artery is patent without stenosis. Hypoplastic right A1 segment. Anterior and middle cerebral arteries are patent bilaterally. Severe stenosis of the superior division of left M1 at the origin. No other significant stenosis in the anterior circulation. Posterior circulation: Severe stenosis in the distal left vertebral artery. Small basilar artery with severe stenosis in the midportion. Fetal origin of the posterior cerebral artery bilaterally. Moderate stenosis of the proximal and mid right posterior cerebral artery. Venous sinuses: Limited venous contrast due to arterial phase scanning. Anatomic variants: None Review of the MIP images confirms the above findings IMPRESSION: 1. Severe intracranial atherosclerotic disease without acute large vessel occlusion. 2. 25% diameter stenosis proximal right internal carotid artery. No  significant left carotid stenosis 3. Non dominant left vertebral artery with diffuse atherosclerotic disease and multiple areas of stenosis. Severe stenosis distal  left vertebral artery. Right vertebral artery is disease without significant stenosis. Electronically Signed   By: Marlan Palau M.D.   On: 01/06/2020 14:31   MR ANGIO HEAD WO CONTRAST  Result Date: 01/06/2020 CLINICAL DATA:  Infarcts on MRI brain EXAM: MRA HEAD WITHOUT CONTRAST TECHNIQUE: Angiographic images of the Circle of Willis were obtained using MRA technique without intravenous contrast. COMPARISON:  None. FINDINGS: Motion artifact is present. Intracranial internal carotid arteries are patent with atherosclerotic irregularity and at least moderate stenosis of the supraclinoid portion. Middle and anterior cerebral arteries are patent. Right A1 ACA is congenitally small or stenotic. Proximal left M2 MCA moderate to marked stenosis. Intracranial vertebral arteries, basilar artery, posterior cerebral arteries are patent. Moderate to marked stenosis of the mid basilar. Bilateral PCA atherosclerotic irregularity with right P2 stenosis. Bilateral posterior communicating arteries are present. There is no aneurysm. IMPRESSION: No proximal intracranial vessel occlusion. Multifocal atherosclerotic irregularity and stenoses. Electronically Signed   By: Guadlupe Spanish M.D.   On: 01/06/2020 11:20   MR BRAIN WO CONTRAST  Result Date: 01/06/2020 CLINICAL DATA:  Left-sided weakness over the last week. EXAM: MRI HEAD WITHOUT CONTRAST TECHNIQUE: Multiplanar, multiecho pulse sequences of the brain and surrounding structures were obtained without intravenous contrast. COMPARISON:  CT head without contrast 01/05/2020 FINDINGS: Brain: Arcs the diffusion-weighted images demonstrate acute/subacute nonhemorrhagic infarcts involving the right corona radiata. Additional subcortical white matter infarcts are present in the posterior right temporal and right occipital  lobes. Minimal cortical involvement is present. Extensive white matter disease is present bilaterally. Remote lacunar infarcts are present within the corona radiata bilaterally. Remote lacunar infarcts are also present in the left lentiform nucleus. Remote lacunar infarct is present in the left thalamus. Remote white matter infarcts are present adjacent to the atrium of the right lateral ventricle. A remote lacunar infarct is present in the left paramedian pons. White matter changes extend through the brainstem. Remote lacunar infarcts are present in the posteroinferior right cerebellum. The ventricles are proportionate to the degree of atrophy. No significant extraaxial fluid collection is present. The internal auditory canals are within normal limits. Vascular: Flow is present in the major intracranial arteries. Skull and upper cervical spine: Mild degenerative changes are present in the upper cervical spine. Craniocervical junction is normal. Marrow signal is normal. Sinuses/Orbits: A fluid level is present in the right sphenoid sinus. The paranasal sinuses and mastoid air cells are otherwise clear. Bilateral lens replacements are noted. Globes and orbits are otherwise unremarkable. IMPRESSION: 1. Acute/subacute nonhemorrhagic infarcts involving the right corona radiata and posterior right temporal and occipital lobes. Findings correspond to the subacute time frame of left-sided weakness. 2. Remote lacunar infarcts of the corona radiata bilaterally, left thalamus, and left paramedian pons. 3. Extensive white matter disease likely reflects the sequela of chronic microvascular ischemia. 4. Right sphenoid sinus disease. Electronically Signed   By: Marin Roberts M.D.   On: 01/06/2020 05:33   ECHOCARDIOGRAM COMPLETE  Result Date: 01/06/2020    ECHOCARDIOGRAM REPORT   Patient Name:   ANISE HARBIN Date of Exam: 01/06/2020 Medical Rec #:  [id number]      Height:       61.0 in Accession #:    [id number]      Weight:       174.4 lb Date of Birth:  10/16/1940       BSA:          1.782 m Patient Age:    46 years  BP:           179/70 mmHg Patient Gender: F              HR:           88 bpm. Exam Location:  Inpatient Procedure: 2D Echo Indications:     Stroke 434.91 / I163.9  History:         Patient has prior history of Echocardiogram examinations, most                  recent 01/28/2017. TIA; Risk Factors:Hypertension and Diabetes.                  Thyroid disease.  Sonographer:     Leta Jungling RDCS Referring Phys:  [id number] [place] Diagnosing Phys: Armanda Magic MD  Sonographer Comments: No IV access. IMPRESSIONS  1. There is a mid cavitary resting gradient of due to hyperdynamic LVF.     Marland Kitchen Left ventricular ejection fraction, by estimation, is 65 to 70%. The left ventricle has normal function. The left ventricle has no regional wall motion abnormalities. Left ventricular diastolic parameters are consistent with Grade I diastolic dysfunction (impaired relaxation). Elevated left ventricular end-diastolic pressure.  2. Right ventricular systolic function is normal. The right ventricular size is normal. Tricuspid regurgitation signal is inadequate for assessing PA pressure.  3. The mitral valve is normal in structure. No evidence of mitral valve regurgitation. Mild mitral stenosis. The mean mitral valve gradient is 6.5 mmHg.  4. The aortic valve is tricuspid. Aortic valve regurgitation is not visualized. Mild to moderate aortic valve sclerosis/calcification is present, without any evidence of aortic stenosis. Aortic valve mean gradient measures 9.0 mmHg. Aortic valve Vmax measures 1.97 m/s.  5. Aortic dilatation noted. 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  6. The inferior vena cava is normal in size with greater than 50% respiratory variability, suggesting right atrial pressure of 3 mmHg. FINDINGS  Left Ventricle: There is a mid cavitary resting gradient of due to  hyperdynamic LVF. Left ventricular ejection fraction, by estimation, is 65 to 70%. The left ventricle has normal function. The left ventricle has no regional wall motion abnormalities. The left ventricular internal cavity size was normal in size. There is no left ventricular hypertrophy. Left ventricular diastolic parameters are consistent with Grade I diastolic dysfunction (impaired relaxation). Elevated left ventricular end-diastolic pressure. Right Ventricle: The right ventricular size is normal. No increase in right ventricular wall thickness. Right ventricular systolic function is normal. Tricuspid regurgitation signal is inadequate for assessing PA pressure. Left Atrium: Left atrial size was normal in size. Right Atrium: Right atrial size was normal in size. Pericardium: There is no evidence of pericardial effusion. Mitral Valve: The mitral valve is normal in structure. Normal mobility of the mitral valve leaflets. Severe mitral annular calcification. No evidence of mitral valve regurgitation. Mild mitral valve stenosis. MV peak gradient, 18.6 mmHg. The mean mitral valve gradient is 6.5 mmHg. Tricuspid Valve: The tricuspid valve is normal in structure. Tricuspid valve regurgitation is not demonstrated. No evidence of tricuspid stenosis. Aortic Valve: The aortic valve is tricuspid. Aortic valve regurgitation is not visualized. Mild to moderate aortic valve sclerosis/calcification is present, without any evidence of aortic stenosis. Aortic valve mean gradient measures 9.0 mmHg. Aortic valve peak gradient measures 15.5 mmHg. Aortic valve area, by VTI measures 1.06 cm. Pulmonic Valve: The pulmonic valve was normal in structure. Pulmonic valve regurgitation is trivial. No evidence of  pulmonic stenosis. Aorta: Aortic dilatation noted. 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 Venous: The inferior vena cava was not well visualized. The inferior vena cava is normal in size with greater than 50%  respiratory variability, suggesting right atrial pressure of 3 mmHg. IAS/Shunts: No atrial level shunt detected by color flow Doppler.  LEFT VENTRICLE PLAX 2D LVIDd:         3.80 cm  Diastology LVIDs:         2.60 cm  LV e' lateral:   4.46 cm/s LV PW:         1.00 cm  LV E/e' lateral: 26.2 LV IVS:        1.10 cm  LV e' medial:    4.13 cm/s LVOT diam:     1.60 cm  LV E/e' medial:  28.3 LV SV:         38 LV SV Index:   21 LVOT Area:     2.01 cm  RIGHT VENTRICLE TAPSE (M-mode): 1.6 cm LEFT ATRIUM             Index LA diam:        3.10 cm 1.74 cm/m LA Vol (A2C):   30.1 ml 16.89 ml/m LA Vol (A4C):   29.3 ml 16.44 ml/m LA Biplane Vol: 34.9 ml 19.59 ml/m  AORTIC VALVE AV Area (Vmax):    1.30 cm AV Area (Vmean):   1.34 cm AV Area (VTI):     1.06 cm AV Vmax:           197.00 cm/s AV Vmean:          135.000 cm/s AV VTI:            0.355 m AV Peak Grad:      15.5 mmHg AV Mean Grad:      9.0 mmHg LVOT Vmax:         127.00 cm/s LVOT Vmean:        89.900 cm/s LVOT VTI:          0.187 m LVOT/AV VTI ratio: 0.53  AORTA Ao Root diam: 3.40 cm MITRAL VALVE MV Area (PHT): 2.48 cm     SHUNTS MV Peak grad:  18.6 mmHg    Systemic VTI:  0.19 m MV Mean grad:  6.5 mmHg     Systemic Diam: 1.60 cm MV Vmax:       2.16 m/s MV Vmean:      117.5 cm/s MV Decel Time: 306 msec MV E velocity: 117.00 cm/s MV A velocity: 209.00 cm/s MV E/A ratio:  0.56 Armanda Magic MD Electronically signed by Armanda Magic MD Signature Date/Time: 01/06/2020/8:59:43 AM    Final (Updated)      Antimicrobials:   none    Subjective: Stable, sitting in bedside chair eating lunch  Objective: Vitals:   01/09/20 2344 01/10/20 0832 01/10/20 0839 01/10/20 1208  BP: (!) 157/57 (!) 134/57  (!) 133/54  Pulse: 81 89  90  Resp: 17   18  Temp: 98.1 F (36.7 C)   98.4 F (36.9 C)  TempSrc: Oral   Oral  SpO2: 99%   99%  Weight:   73.3 kg   Height:   5\' 1"  (1.549 m)     Intake/Output Summary (Last 24 hours) at 01/10/2020 1438 Last data filed at 01/10/2020  1345 Gross per 24 hour  Intake 720 ml  Output 600 ml  Net 120 ml   Filed Weights   01/10/20 0839  Weight: 73.3 kg    Examination:  General exam: Comfortable,lying on bed HEENT:PERRL,Oral mucosa moist, Ear/Nose normal on gross exam Respiratory system: Bilateral equal air entry, normal vesicular breath sounds, no wheezes or crackles  Cardiovascular system: S1 & S2 heard, RRR. No JVD, murmurs, rubs, gallops or clicks. Gastrointestinal system: Abdomen is nondistended, soft and nontender. No organomegaly or masses felt. Normal bowel sounds heard. Central nervous system: Alert and oriented. No focal neurological deficits. Extremities: No edema, no clubbing ,no cyanosis, distal peripheral pulses palpable. Skin: No rashes, lesions or ulcers,no icterus ,no pallor    Data Reviewed: I have personally reviewed following labs and imaging studies  CBC: Recent Labs  Lab 01/05/20 1401  WBC 11.1*  NEUTROABS 6.5  HGB 13.0  HCT 41.7  MCV 87.4  PLT 258   Basic Metabolic Panel: Recent Labs  Lab 01/05/20 1401 01/06/20 0842  NA 132* 137  K 5.3* 4.3  CL 98 102  CO2 22 24  GLUCOSE 135* 161*  BUN 15 13  CREATININE 0.78 0.72  CALCIUM 9.3 9.1   GFR: Estimated Creatinine Clearance: 54.8 mL/min (by C-G formula based on SCr of 0.72 mg/dL). Liver Function Tests: Recent Labs  Lab 01/05/20 1401  AST 18  ALT 12  ALKPHOS 57  BILITOT 0.3  PROT 8.0  ALBUMIN 3.5   No results for input(s): LIPASE, AMYLASE in the last 168 hours. No results for input(s): AMMONIA in the last 168 hours. Coagulation Profile: Recent Labs  Lab 01/05/20 1401  INR 1.0   Cardiac Enzymes: No results for input(s): CKTOTAL, CKMB, CKMBINDEX, TROPONINI in the last 168 hours. BNP (last 3 results) No results for input(s): PROBNP in the last 8760 hours. HbA1C: No results for input(s): HGBA1C in the last 72 hours. CBG: Recent Labs  Lab 01/09/20 1108 01/09/20 1604 01/09/20 2105 01/10/20 0602 01/10/20 1103   GLUCAP 156* 153* 125* 143* 166*   Lipid Profile: No results for input(s): CHOL, HDL, LDLCALC, TRIG, CHOLHDL, LDLDIRECT in the last 72 hours. Thyroid Function Tests: No results for input(s): TSH, T4TOTAL, FREET4, T3FREE, THYROIDAB in the last 72 hours. Anemia Panel: No results for input(s): VITAMINB12, FOLATE, FERRITIN, TIBC, IRON, RETICCTPCT in the last 72 hours. Sepsis Labs: No results for input(s): PROCALCITON, LATICACIDVEN in the last 168 hours.  Recent Results (from the past 240 hour(s))  SARS Coronavirus 2 by RT PCR (hospital order, performed in [facility] hospital lab) Nasopharyngeal Nasopharyngeal Swab     Status: None   Collection Time: 01/05/20  9:26 PM   Specimen: Nasopharyngeal Swab  Result Value Ref Range Status   SARS Coronavirus 2 NEGATIVE NEGATIVE Final    Comment: (NOTE) SARS-CoV-2 target nucleic acids are NOT DETECTED.  The SARS-CoV-2 RNA is generally detectable in upper and lower respiratory specimens during the acute phase of infection. The lowest concentration of SARS-CoV-2 viral copies this assay can detect is 250 copies / mL. A negative result does not preclude SARS-CoV-2 infection and should not be used as the sole basis for treatment or other patient management decisions.  A negative result may occur with improper specimen collection / handling, submission of specimen other than nasopharyngeal swab, presence of viral mutation(s) within the areas targeted by this assay, and inadequate number of viral copies (<250 copies / mL). A negative result must be combined with clinical observations, patient history, and epidemiological information.  Fact Sheet for Patients:   BoilerBrush.com.cy  Fact Sheet for Healthcare Providers: https://pope.com/  This test is not yet approved or  cleared  by the [place] and has been authorized for detection and/or diagnosis of SARS-CoV-2 by FDA under an Emergency Use  Authorization (EUA).  This EUA will remain in effect (meaning this test can be used) for the duration of the COVID-19 declaration under Section 564(b)(1) of the Act, 21 U.S.C. section 360bbb-3(b)(1), unless the authorization is terminated or revoked sooner.  Performed at [facility], [address] [place], [place] [address]          Radiology Studies: No results found.      Scheduled Meds: . amLODipine  10 mg Oral Daily  . aspirin  300 mg Rectal Daily   Or  . aspirin  325 mg Oral Daily  . atorvastatin  80 mg Oral Daily  . clopidogrel  75 mg Oral Daily  . enoxaparin (LOVENOX) injection  40 mg Subcutaneous Q24H  . fluticasone  1 spray Each Nare Daily  . insulin aspart  0-5 Units Subcutaneous QHS  . insulin aspart  0-9 Units Subcutaneous TID WC  . levothyroxine  88 mcg Oral QAC breakfast  . losartan  50 mg Oral QHS  . sodium chloride flush  3 mL Intravenous Once   Continuous Infusions:   LOS: 4 days    Time spent: 35 min    Burke Keels, MD [facility]  If 7PM-7AM, please contact night-coverage  01/10/2020, 2:38 PM

## 2020-01-11 ENCOUNTER — Inpatient Hospital Stay (HOSPITAL_COMMUNITY): Payer: Medicare HMO

## 2020-01-11 LAB — GLUCOSE, CAPILLARY
Glucose-Capillary: 174 mg/dL — ABNORMAL HIGH (ref 70–99)
Glucose-Capillary: 121 mg/dL — ABNORMAL HIGH (ref 70–99)
Glucose-Capillary: 234 mg/dL — ABNORMAL HIGH (ref 70–99)
Glucose-Capillary: 218 mg/dL — ABNORMAL HIGH (ref 70–99)

## 2020-01-11 NOTE — Progress Notes (Signed)
PROGRESS NOTE    Jill Rose  ZOX:[id number] DOB: 05-28-1941 DOA: 01/05/2020 PCP: Ralene Ok, MD   Brief Narrative:  Patient is a 79 year old female with history of hypertension, diabetes type 2, hypothyroidism, history of TIA who presented with left-sided weakness. She had weakness more on the left lower extremity than the upper. It hadbeen ongoing for last 1 week. She also feels that her speech is slurred. She had difficulty getting up the stairs due to weakness of the leg. CT head on presentation showed old left basal ganglia strokes. MRI of the brain showed acute/subacute nonhemorrhagic right-sided infarct. Patient admitted for stroke work-up. Neurology consulted. Work up completed.Physical therapy recommended inpatient rehab. Patient is medically stable for discharge to CIR as soon as bed is available   Assessment & Plan:   Principal Problem:   Acute ischemic stroke [facility]) Active Problems:   Hypothyroidism   Essential hypertension   Acute CVA (cerebrovascular accident) (HCC)   Controlled type 2 diabetes mellitus with hyperglycemia (HCC)   Morbid obesity (HCC)   Leukocytosis   History of TIA (transient ischemic attack)   Acute ischemic stroke:MRI showed acute/subacute nonhemorrhagic infarcts involving the right corona radiata and posterior right temporal and occipital lobes,remote lacunar infarcts of the corona radiata bilaterally, left thalamus, and left paramedian pons. MRA showed multifocal atherosclerosis no proximal occlusion. CT head and neck showed severe intracranial atherosclerosis without large vessel occlusion. Proximal ICA has 25% stenosis. PT/OT recommending CIR. LDL of 196. Hemoglobin A1c is 7.6 CT angio head and neck showed severe intracranial atherosclerotic disease without acute large vessel occlusion,25% diameter stenosis proximal right internal carotid artery. No significant left carotid stenosis,non dominant left vertebral artery with diffuse  atherosclerotic disease and multiple areas of stenosis. Severe stenosis distal left vertebral artery. Echocardiogram showed left ventricular ejection fraction of 65 to 70%, grade 1 diastolic dysfunction, no atrial level shunt, no cardiac source of emboli Started on aspirin and Plavix. Neurology recommending aspirin 325 mg daily and Plavix for 3 months then Plavix alone. Started on Lipitor Reported mild dysarthria which has happened when she was tired, repeat head ct negative  Hypertension:BP stable now.She is on losartan at home. Monitor blood pressure.Added amlodipine 10 mg.  Hyperlipidemia:LDL of 196. Started on Lipitor 80 mg daily  Hypothyroidism: Continue Synthyroid  Diabetes type 2:Not on any medications at home. Hemoglobin A1c was 7.6. Continue sliding-scale insulin here. We will put her on Metformin on discharge.    DVT prophylaxis:Lovenox Code Status:Full Family Communication:Daughter present at the bedside Status is: Inpatient   Dispo: The patient is from: Home Anticipated d/c is to:CIR Anticipated d/c date is: On the day of bed availability at [facility] Patient is medically stable for DC as soon as bed is available.      Code Status Orders  (From admission, onward)         Start     Ordered   01/05/20 2216  Full code  Continuous        01/05/20 2219        Code Status History    Date Active Date Inactive Code Status Order ID Comments User Context   01/26/2017 1942 01/28/2017 1918 Full Code [id number]  Lorretta Harp, MD ED   Advance Care Planning Activity        Consultants:   neuro  Procedures:  CT ANGIO HEAD W OR WO CONTRAST  Result Date: 01/06/2020 CLINICAL DATA:  Altered mental status. Stroke. Left-sided weakness. EXAM: CT ANGIOGRAPHY HEAD AND NECK TECHNIQUE: Multidetector CT imaging  of the head and neck was performed using the standard protocol during bolus administration of intravenous contrast. Multiplanar  CT image reconstructions and MIPs were obtained to evaluate the vascular anatomy. Carotid stenosis measurements (when applicable) are obtained utilizing NASCET criteria, using the distal internal carotid diameter as the denominator. CONTRAST:  59mL OMNIPAQUE IOHEXOL 350 MG/ML SOLN COMPARISON:  CT head 01/05/2020.  MRI head and MRA head 01/06/2020 FINDINGS: CTA NECK FINDINGS Aortic arch: 2 vessel arch with bovine branching pattern. Mild atherosclerotic disease in the aortic arch. Proximal great vessels widely patent. Right carotid system: Right common carotid artery widely patent. Medial deviation of the right carotid bifurcation posterior to the pharynx. Atherosclerotic calcification in the proximal right internal carotid artery with 25% diameter stenosis. Left carotid system: Left carotid artery widely patent without stenosis. Mild atherosclerotic disease at the bifurcation. Medial deviation of the carotid bifurcation in the retropharyngeal space. Vertebral arteries: Right vertebral artery is patent to the basilar with mild scattered atherosclerotic disease Non dominant left vertebral artery with diffuse atherosclerotic disease. There are multiple areas of stenosis in the proximal and mid and distal left vertebral artery which is patent to the basilar. There is moderate to severe stenosis in the distal left vertebral artery at the skull base and in the left V3 and V4 segments. Skeleton: Cervical spondylosis and kyphosis. No acute skeletal abnormality. Other neck: Negative for mass or adenopathy in the neck. Upper chest: Lung apices clear bilaterally. Review of the MIP images confirms the above findings CTA HEAD FINDINGS Anterior circulation: Atherosclerotic calcification in the cavernous carotid bilaterally. Severe stenosis right supraclinoid internal carotid artery and mild to moderate stenosis right cavernous carotid. Left internal carotid artery is patent without stenosis. Hypoplastic right A1 segment. Anterior  and middle cerebral arteries are patent bilaterally. Severe stenosis of the superior division of left M1 at the origin. No other significant stenosis in the anterior circulation. Posterior circulation: Severe stenosis in the distal left vertebral artery. Small basilar artery with severe stenosis in the midportion. Fetal origin of the posterior cerebral artery bilaterally. Moderate stenosis of the proximal and mid right posterior cerebral artery. Venous sinuses: Limited venous contrast due to arterial phase scanning. Anatomic variants: None Review of the MIP images confirms the above findings IMPRESSION: 1. Severe intracranial atherosclerotic disease without acute large vessel occlusion. 2. 25% diameter stenosis proximal right internal carotid artery. No significant left carotid stenosis 3. Non dominant left vertebral artery with diffuse atherosclerotic disease and multiple areas of stenosis. Severe stenosis distal left vertebral artery. Right vertebral artery is disease without significant stenosis. Electronically Signed   By: Marlan Palau M.D.   On: 01/06/2020 14:31   DG Chest 2 View  Result Date: 01/05/2020 CLINICAL DATA:  Acute ischemic stroke. EXAM: CHEST - 2 VIEW COMPARISON:  10/17/2013 FINDINGS: Lower lung volumes from prior exam. There are patchy opacities at the left greater than right lung base. Mild cardiomegaly. Aortic atherosclerosis. No significant pleural effusion. There may be minimal fluid in the fissures. No pneumothorax. No acute osseous abnormalities are seen. IMPRESSION: 1. Patchy bibasilar opacities, left greater than right, may be atelectasis or pneumonia. 2. Mild cardiomegaly.  Aortic Atherosclerosis (ICD10-I70.0). Electronically Signed   By: Narda Rutherford M.D.   On: 01/05/2020 22:43   CT HEAD WO CONTRAST  Result Date: 01/11/2020 CLINICAL DATA:  Stroke, follow-up EXAM: CT HEAD WITHOUT CONTRAST TECHNIQUE: Contiguous axial images were obtained from the base of the skull through the  vertex without intravenous contrast. COMPARISON:  01/05/2020 FINDINGS: Brain:  Recent infarcts of the left corona radiata and right temporal occipital lobes better seen on prior MRI with some ill-defined hypoattenuation noted on CT. There is no acute intracranial hemorrhage or mass effect. No new loss of gray-white differentiation. 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 Ventricles are stable in size. Vascular: There is atherosclerotic calcification at the skull base. Skull: Calvarium is unremarkable. Sinuses/Orbits: Layering secretions in the right sphenoid sinus. Other: Mastoid air cells are clear. IMPRESSION: No acute intracranial hemorrhage. Recent small infarcts better seen on MRI. Stable chronic findings detailed above. Electronically Signed   By: Macy Mis M.D.   On: 01/11/2020 13:21   CT HEAD WO CONTRAST  Result Date: 01/05/2020 CLINICAL DATA:  Increased left leg weakness. EXAM: CT HEAD WITHOUT CONTRAST TECHNIQUE: Contiguous axial images were obtained from the base of the skull through the vertex without intravenous contrast. COMPARISON:  January 26, 2017 FINDINGS: Brain: There is mild cerebral atrophy with widening of the extra-axial spaces and ventricular dilatation. There are areas of decreased attenuation within the white matter tracts of the supratentorial brain, consistent with microvascular disease changes. Small chronic left basal ganglia lacunar infarcts are noted. Vascular: No hyperdense vessel or unexpected calcification. Skull: Normal. Negative for fracture or focal lesion. Sinuses/Orbits: There is mild sphenoid sinus mucosal thickening. Other: None. IMPRESSION: 1. Generalized cerebral atrophy. 2. Small chronic left basal ganglia lacunar infarcts. 3. No acute intracranial abnormality. Electronically Signed   By: Virgina Norfolk M.D.   On: 01/05/2020 15:42   CT ANGIO NECK W OR WO  CONTRAST  Result Date: 01/06/2020 CLINICAL DATA:  Altered mental status. Stroke. Left-sided weakness. EXAM: CT ANGIOGRAPHY HEAD AND NECK TECHNIQUE: Multidetector CT imaging of the head and neck was performed using the standard protocol during bolus administration of intravenous contrast. Multiplanar CT image reconstructions and MIPs were obtained to evaluate the vascular anatomy. Carotid stenosis measurements (when applicable) are obtained utilizing NASCET criteria, using the distal internal carotid diameter as the denominator. CONTRAST:  37mL OMNIPAQUE IOHEXOL 350 MG/ML SOLN COMPARISON:  CT head 01/05/2020.  MRI head and MRA head 01/06/2020 FINDINGS: CTA NECK FINDINGS Aortic arch: 2 vessel arch with bovine branching pattern. Mild atherosclerotic disease in the aortic arch. Proximal great vessels widely patent. Right carotid system: Right common carotid artery widely patent. Medial deviation of the right carotid bifurcation posterior to the pharynx. Atherosclerotic calcification in the proximal right internal carotid artery with 25% diameter stenosis. Left carotid system: Left carotid artery widely patent without stenosis. Mild atherosclerotic disease at the bifurcation. Medial deviation of the carotid bifurcation in the retropharyngeal space. Vertebral arteries: Right vertebral artery is patent to the basilar with mild scattered atherosclerotic disease Non dominant left vertebral artery with diffuse atherosclerotic disease. There are multiple areas of stenosis in the proximal and mid and distal left vertebral artery which is patent to the basilar. There is moderate to severe stenosis in the distal left vertebral artery at the skull base and in the left V3 and V4 segments. Skeleton: Cervical spondylosis and kyphosis. No acute skeletal abnormality. Other neck: Negative for mass or adenopathy in the neck. Upper chest: Lung apices clear bilaterally. Review of the MIP images confirms the above findings CTA HEAD  FINDINGS Anterior circulation: Atherosclerotic calcification in the cavernous carotid bilaterally. Severe stenosis right supraclinoid internal carotid artery and mild to moderate stenosis right cavernous carotid. Left internal carotid artery is patent without stenosis. Hypoplastic right A1 segment. Anterior and  middle cerebral arteries are patent bilaterally. Severe stenosis of the superior division of left M1 at the origin. No other significant stenosis in the anterior circulation. Posterior circulation: Severe stenosis in the distal left vertebral artery. Small basilar artery with severe stenosis in the midportion. Fetal origin of the posterior cerebral artery bilaterally. Moderate stenosis of the proximal and mid right posterior cerebral artery. Venous sinuses: Limited venous contrast due to arterial phase scanning. Anatomic variants: None Review of the MIP images confirms the above findings IMPRESSION: 1. Severe intracranial atherosclerotic disease without acute large vessel occlusion. 2. 25% diameter stenosis proximal right internal carotid artery. No significant left carotid stenosis 3. Non dominant left vertebral artery with diffuse atherosclerotic disease and multiple areas of stenosis. Severe stenosis distal left vertebral artery. Right vertebral artery is disease without significant stenosis. Electronically Signed   By: Marlan Palau M.D.   On: 01/06/2020 14:31   MR ANGIO HEAD WO CONTRAST  Result Date: 01/06/2020 CLINICAL DATA:  Infarcts on MRI brain EXAM: MRA HEAD WITHOUT CONTRAST TECHNIQUE: Angiographic images of the Circle of Willis were obtained using MRA technique without intravenous contrast. COMPARISON:  None. FINDINGS: Motion artifact is present. Intracranial internal carotid arteries are patent with atherosclerotic irregularity and at least moderate stenosis of the supraclinoid portion. Middle and anterior cerebral arteries are patent. Right A1 ACA is congenitally small or stenotic. Proximal  left M2 MCA moderate to marked stenosis. Intracranial vertebral arteries, basilar artery, posterior cerebral arteries are patent. Moderate to marked stenosis of the mid basilar. Bilateral PCA atherosclerotic irregularity with right P2 stenosis. Bilateral posterior communicating arteries are present. There is no aneurysm. IMPRESSION: No proximal intracranial vessel occlusion. Multifocal atherosclerotic irregularity and stenoses. Electronically Signed   By: Guadlupe Spanish M.D.   On: 01/06/2020 11:20   MR BRAIN WO CONTRAST  Result Date: 01/06/2020 CLINICAL DATA:  Left-sided weakness over the last week. EXAM: MRI HEAD WITHOUT CONTRAST TECHNIQUE: Multiplanar, multiecho pulse sequences of the brain and surrounding structures were obtained without intravenous contrast. COMPARISON:  CT head without contrast 01/05/2020 FINDINGS: Brain: Arcs the diffusion-weighted images demonstrate acute/subacute nonhemorrhagic infarcts involving the right corona radiata. Additional subcortical white matter infarcts are present in the posterior right temporal and right occipital lobes. Minimal cortical involvement is present. Extensive white matter disease is present bilaterally. Remote lacunar infarcts are present within the corona radiata bilaterally. Remote lacunar infarcts are also present in the left lentiform nucleus. Remote lacunar infarct is present in the left thalamus. Remote white matter infarcts are present adjacent to the atrium of the right lateral ventricle. A remote lacunar infarct is present in the left paramedian pons. White matter changes extend through the brainstem. Remote lacunar infarcts are present in the posteroinferior right cerebellum. The ventricles are proportionate to the degree of atrophy. No significant extraaxial fluid collection is present. The internal auditory canals are within normal limits. Vascular: Flow is present in the major intracranial arteries. Skull and upper cervical spine: Mild degenerative  changes are present in the upper cervical spine. Craniocervical junction is normal. Marrow signal is normal. Sinuses/Orbits: A fluid level is present in the right sphenoid sinus. The paranasal sinuses and mastoid air cells are otherwise clear. Bilateral lens replacements are noted. Globes and orbits are otherwise unremarkable. IMPRESSION: 1. Acute/subacute nonhemorrhagic infarcts involving the right corona radiata and posterior right temporal and occipital lobes. Findings correspond to the subacute time frame of left-sided weakness. 2. Remote lacunar infarcts of the corona radiata bilaterally, left thalamus, and left paramedian pons. 3. Extensive  white matter disease likely reflects the sequela of chronic microvascular ischemia. 4. Right sphenoid sinus disease. Electronically Signed   By: Marin Roberts M.D.   On: 01/06/2020 05:33   ECHOCARDIOGRAM COMPLETE  Result Date: 01/06/2020    ECHOCARDIOGRAM REPORT   Patient Name:   Jill Rose Date of Exam: 01/06/2020 Medical Rec #:  [id number]      Height:       61.0 in Accession #:    [id number]     Weight:       174.4 lb Date of Birth:  March 24, 1941       BSA:          1.782 m Patient Age:    78 years       BP:           179/70 mmHg Patient Gender: F              HR:           88 bpm. Exam Location:  Inpatient Procedure: 2D Echo Indications:     Stroke 434.91 / I163.9  History:         Patient has prior history of Echocardiogram examinations, most                  recent 01/28/2017. TIA; Risk Factors:Hypertension and Diabetes.                  Thyroid disease.  Sonographer:     Leta Jungling RDCS Referring Phys:  [id number] [place] Diagnosing Phys: Armanda Magic MD  Sonographer Comments: No IV access. IMPRESSIONS  1. There is a mid cavitary resting gradient of due to hyperdynamic LVF.     Marland Kitchen Left ventricular ejection fraction, by estimation, is 65 to 70%. The left ventricle has normal function. The left ventricle has no regional wall motion abnormalities.  Left ventricular diastolic parameters are consistent with Grade I diastolic dysfunction (impaired relaxation). Elevated left ventricular end-diastolic pressure.  2. Right ventricular systolic function is normal. The right ventricular size is normal. Tricuspid regurgitation signal is inadequate for assessing PA pressure.  3. The mitral valve is normal in structure. No evidence of mitral valve regurgitation. Mild mitral stenosis. The mean mitral valve gradient is 6.5 mmHg.  4. The aortic valve is tricuspid. Aortic valve regurgitation is not visualized. Mild to moderate aortic valve sclerosis/calcification is present, without any evidence of aortic stenosis. Aortic valve mean gradient measures 9.0 mmHg. Aortic valve Vmax measures 1.97 m/s.  5. Aortic dilatation noted. 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  6. The inferior vena cava is normal in size with greater than 50% respiratory variability, suggesting right atrial pressure of 3 mmHg. FINDINGS  Left Ventricle: There is a mid cavitary resting gradient of due to hyperdynamic LVF. Left ventricular ejection fraction, by estimation, is 65 to 70%. The left ventricle has normal function. The left ventricle has no regional wall motion abnormalities. The left ventricular internal cavity size was normal in size. There is no left ventricular hypertrophy. Left ventricular diastolic parameters are consistent with Grade I diastolic dysfunction (impaired relaxation). Elevated left ventricular end-diastolic pressure. Right Ventricle: The right ventricular size is normal. No increase in right ventricular wall thickness. Right ventricular systolic function is normal. Tricuspid regurgitation signal is inadequate for assessing PA pressure. Left Atrium: Left atrial size was normal in size. Right Atrium: Right atrial size was normal in size. Pericardium: There is no evidence of pericardial  effusion. Mitral Valve: The mitral valve is normal in structure.  Normal mobility of the mitral valve leaflets. Severe mitral annular calcification. No evidence of mitral valve regurgitation. Mild mitral valve stenosis. MV peak gradient, 18.6 mmHg. The mean mitral valve gradient is 6.5 mmHg. Tricuspid Valve: The tricuspid valve is normal in structure. Tricuspid valve regurgitation is not demonstrated. No evidence of tricuspid stenosis. Aortic Valve: The aortic valve is tricuspid. Aortic valve regurgitation is not visualized. Mild to moderate aortic valve sclerosis/calcification is present, without any evidence of aortic stenosis. Aortic valve mean gradient measures 9.0 mmHg. Aortic valve peak gradient measures 15.5 mmHg. Aortic valve area, by VTI measures 1.06 cm. Pulmonic Valve: The pulmonic valve was normal in structure. Pulmonic valve regurgitation is trivial. No evidence of pulmonic stenosis. Aorta: Aortic dilatation noted. 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 Venous: The inferior vena cava was not well visualized. The inferior vena cava is normal in size with greater than 50% respiratory variability, suggesting right atrial pressure of 3 mmHg. IAS/Shunts: No atrial level shunt detected by color flow Doppler.  LEFT VENTRICLE PLAX 2D LVIDd:         3.80 cm  Diastology LVIDs:         2.60 cm  LV e' lateral:   4.46 cm/s LV PW:         1.00 cm  LV E/e' lateral: 26.2 LV IVS:        1.10 cm  LV e' medial:    4.13 cm/s LVOT diam:     1.60 cm  LV E/e' medial:  28.3 LV SV:         38 LV SV Index:   21 LVOT Area:     2.01 cm  RIGHT VENTRICLE TAPSE (M-mode): 1.6 cm LEFT ATRIUM             Index LA diam:        3.10 cm 1.74 cm/m LA Vol (A2C):   30.1 ml 16.89 ml/m LA Vol (A4C):   29.3 ml 16.44 ml/m LA Biplane Vol: 34.9 ml 19.59 ml/m  AORTIC VALVE AV Area (Vmax):    1.30 cm AV Area (Vmean):   1.34 cm AV Area (VTI):     1.06 cm AV Vmax:           197.00 cm/s AV Vmean:          135.000 cm/s AV VTI:            0.355 m AV Peak Grad:      15.5 mmHg AV Mean  Grad:      9.0 mmHg LVOT Vmax:         127.00 cm/s LVOT Vmean:        89.900 cm/s LVOT VTI:          0.187 m LVOT/AV VTI ratio: 0.53  AORTA Ao Root diam: 3.40 cm MITRAL VALVE MV Area (PHT): 2.48 cm     SHUNTS MV Peak grad:  18.6 mmHg    Systemic VTI:  0.19 m MV Mean grad:  6.5 mmHg     Systemic Diam: 1.60 cm MV Vmax:       2.16 m/s MV Vmean:      117.5 cm/s MV Decel Time: 306 msec MV E velocity: 117.00 cm/s MV A velocity: 209.00 cm/s MV E/A ratio:  0.56 Armanda Magic MD Electronically signed by Armanda Magic MD Signature Date/Time: 01/06/2020/8:59:43 AM    Final (Updated)  Antimicrobials:   none   Subjective: SITTING IN BEDSIDE CHAIR AGAIN, EATING WITHOUT ACUTE COMPLAINT, DID REPORT MILD DYSARTHRIA  Objective: Vitals:   01/11/20 0014 01/11/20 0400 01/11/20 0755 01/11/20 1233  BP: (!) 157/62 (!) 146/65 (!) 149/61 (!) 164/65  Pulse: 83 62 88 98  Resp: 18 18 16 16   Temp: 98.3 F (36.8 C) 98.3 F (36.8 C) 98.3 F (36.8 C) 98 F (36.7 C)  TempSrc: Oral Oral Oral Oral  SpO2: 99% 100% 98% 100%  Weight:      Height:        Intake/Output Summary (Last 24 hours) at 01/11/2020 1458 Last data filed at 01/11/2020 0618 Gross per 24 hour  Intake 360 ml  Output 1500 ml  Net -1140 ml   Filed Weights   01/10/20 0839  Weight: 73.3 kg    Examination:  General exam:Comfortable,lying on bed HEENT:PERRL,Oral mucosa moist, Ear/Nose normal on gross exam Respiratory system: Bilateral equal air entry, normal vesicular breath sounds, no wheezes or crackles  Cardiovascular system:S1 &S2 heard, RRR. No JVD, murmurs, rubs, gallops or clicks. Gastrointestinal system:Abdomen is nondistended, soft and nontender. No organomegaly or masses felt. Normal bowel sounds heard. Central nervous system:Alert and oriented. MILD DYSARTHRIA,  Extremities: No edema, no clubbing ,no cyanosis, distal peripheral pulses palpable. Skin: No rashes, lesions or ulcers,no icterus ,no pallor     Data Reviewed: I  have personally reviewed following labs and imaging studies  CBC: Recent Labs  Lab 01/05/20 1401  WBC 11.1*  NEUTROABS 6.5  HGB 13.0  HCT 41.7  MCV 87.4  PLT 258   Basic Metabolic Panel: Recent Labs  Lab 01/05/20 1401 01/06/20 0842  NA 132* 137  K 5.3* 4.3  CL 98 102  CO2 22 24  GLUCOSE 135* 161*  BUN 15 13  CREATININE 0.78 0.72  CALCIUM 9.3 9.1   GFR: Estimated Creatinine Clearance: 54.8 mL/min (by C-G formula based on SCr of 0.72 mg/dL). Liver Function Tests: Recent Labs  Lab 01/05/20 1401  AST 18  ALT 12  ALKPHOS 57  BILITOT 0.3  PROT 8.0  ALBUMIN 3.5   No results for input(s): LIPASE, AMYLASE in the last 168 hours. No results for input(s): AMMONIA in the last 168 hours. Coagulation Profile: Recent Labs  Lab 01/05/20 1401  INR 1.0   Cardiac Enzymes: No results for input(s): CKTOTAL, CKMB, CKMBINDEX, TROPONINI in the last 168 hours. BNP (last 3 results) No results for input(s): PROBNP in the last 8760 hours. HbA1C: No results for input(s): HGBA1C in the last 72 hours. CBG: Recent Labs  Lab 01/10/20 1103 01/10/20 1613 01/10/20 2055 01/11/20 0615 01/11/20 1111  GLUCAP 166* 256* 127* 174* 121*   Lipid Profile: No results for input(s): CHOL, HDL, LDLCALC, TRIG, CHOLHDL, LDLDIRECT in the last 72 hours. Thyroid Function Tests: No results for input(s): TSH, T4TOTAL, FREET4, T3FREE, THYROIDAB in the last 72 hours. Anemia Panel: No results for input(s): VITAMINB12, FOLATE, FERRITIN, TIBC, IRON, RETICCTPCT in the last 72 hours. Sepsis Labs: No results for input(s): PROCALCITON, LATICACIDVEN in the last 168 hours.  Recent Results (from the past 240 hour(s))  SARS Coronavirus 2 by RT PCR (hospital order, performed [facility]) Nasopharyngeal Nasopharyngeal Swab     Status: None   Collection Time: 01/05/20  9:26 PM   Specimen: Nasopharyngeal Swab  Result Value Ref Range Status   SARS Coronavirus 2 NEGATIVE NEGATIVE Final    Comment:  (NOTE) SARS-CoV-2 target nucleic acids are NOT DETECTED.  The SARS-CoV-2  RNA is generally detectable in upper and lower respiratory specimens during the acute phase of infection. The lowest concentration of SARS-CoV-2 viral copies this assay can detect is 250 copies / mL. A negative result does not preclude SARS-CoV-2 infection and should not be used as the sole basis for treatment or other patient management decisions.  A negative result may occur with improper specimen collection / handling, submission of specimen other than nasopharyngeal swab, presence of viral mutation(s) within the areas targeted by this assay, and inadequate number of viral copies (<250 copies / mL). A negative result must be combined with clinical observations, patient history, and epidemiological information.  Fact Sheet for Patients:   BoilerBrush.com.cy  Fact Sheet for Healthcare Providers: https://pope.com/  This test is not yet approved or  cleared by the [place] FDA and has been authorized for detection and/or diagnosis of SARS-CoV-2 by FDA under an Emergency Use Authorization (EUA).  This EUA will remain in effect (meaning this test can be used) for the duration of the COVID-19 declaration under Section 564(b)(1) of the Act, 21 U.S.C. section 360bbb-3(b)(1), unless the authorization is terminated or revoked sooner.  Performed at [facility], [address], [place], [place] [address]          Radiology Studies: CT HEAD WO CONTRAST  Result Date: 01/11/2020 CLINICAL DATA:  Stroke, follow-up EXAM: CT HEAD WITHOUT CONTRAST TECHNIQUE: Contiguous axial images were obtained from the base of the skull through the vertex without intravenous contrast. 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  There is no acute intracranial hemorrhage or mass effect. No new loss of gray-white differentiation. 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 Ventricles are stable in size. Vascular: There is atherosclerotic calcification at the skull base. Skull: Calvarium is unremarkable. Sinuses/Orbits: Layering secretions in the right sphenoid sinus. Other: Mastoid air cells are clear. IMPRESSION: No acute intracranial hemorrhage. Recent small infarcts better seen on MRI. Stable chronic findings detailed above. Electronically Signed   By: Guadlupe Spanish M.D.   On: 01/11/2020 13:21        Scheduled Meds: . amLODipine  10 mg Oral Daily  . aspirin  300 mg Rectal Daily   Or  . aspirin  325 mg Oral Daily  . atorvastatin  80 mg Oral Daily  . clopidogrel  75 mg Oral Daily  . enoxaparin (LOVENOX) injection  40 mg Subcutaneous Q24H  . fluticasone  1 spray Each Nare Daily  . insulin aspart  0-5 Units Subcutaneous QHS  . insulin aspart  0-9 Units Subcutaneous TID WC  . levothyroxine  88 mcg Oral QAC breakfast  . losartan  50 mg Oral QHS  . sodium chloride flush  3 mL Intravenous Once   Continuous Infusions:   LOS: 5 days    Time spent: 35 min    Burke Keels, MD [facility]  If 7PM-7AM, please contact night-coverage  01/11/2020, 2:58 PM

## 2020-01-12 LAB — GLUCOSE, CAPILLARY
Glucose-Capillary: 130 mg/dL — ABNORMAL HIGH (ref 70–99)
Glucose-Capillary: 190 mg/dL — ABNORMAL HIGH (ref 70–99)
Glucose-Capillary: 166 mg/dL — ABNORMAL HIGH (ref 70–99)
Glucose-Capillary: 129 mg/dL — ABNORMAL HIGH (ref 70–99)

## 2020-01-12 NOTE — Progress Notes (Addendum)
[facility]                                                                              Patient Demographics  Jill Rose, is a 79 y.o. female, DOB - 05/10/1941, EAV:[id number]  Admit date - 01/05/2020   Admitting Physician Burnadette Pop, MD  Outpatient Primary MD for the patient is Ralene Ok, MD  Outpatient specialists:   LOS - 6  days   Medical records reviewed and are as summarized below:    No chief complaint on file.      Brief summary   Patient is a 79 year old female with history of hypertension, diabetes type 2, hypothyroidism, history of TIA who presented with left-sided weakness. She had weakness more on the left lower extremity than the upper. It hadbeen ongoing for last 1 week. She also feels that her speech is slurred. She had difficulty getting up the stairs due to weakness of the leg. CT head on presentation showed old left basal ganglia strokes. MRI of the brain showed acute/subacute nonhemorrhagic right-sided infarct. Patient admitted for stroke work-up. Neurology consulted. Work up completed.Physical therapy recommended inpatient rehab.   Assessment & Plan    Principal Problem:   Acute ischemic stroke [facility]) -Patient presented with left-sided weakness, slurred speech - MRI brain acute/subacute nonhemorrhagic infarcts involving the right corona radiata and posterior right temporal and occipital lobes,remote lacunar infarcts of the corona radiata bilaterally, left thalamus, and left paramedian pons. - MRA showed multifocal atherosclerosis no proximal occlusion.  -CTA head and neck showed severe intracranial atherosclerosis without large vessel occlusion. Proximal right ICA has 25% stenosis.  No significant left carotid stenosis.  Diffuse atherosclerotic disease in multiple areas of stenosis, severe stenosis distal left vertebral artery - LDL 196. Hemoglobin A1c  7.6 -2D echo showed EF of 65 to 70%, grade 1 diastolic dysfunction,  no atrial shunt, no cardiac source of emboli -Neurology recommended aspirin 325 mg daily and Plavix for 3 months then Plavix alone -Started on Lipitor  -Repeat CT head on 6/27 showed no acute intracranial abnormality  Active problems Hypertension: -BP improving, continue losartan, added amlodipine 10 mg daily   Hyperlipidemia: - LDL of 196, goal less than 70, continue Lipitor 80 mg.   Hypothyroidism: - Continue Synthyroid  Diabetes mellitus type II, NIDDM -Hemoglobin A1c 7.6, continue sliding scale insulin while inpatient -Will likely need Metformin on discharge  Obesity Estimated body mass index is 30.53 kg/m as calculated from the following:   Height as of this encounter:  (1.549 m).   Weight as of this encounter: 73.3 kg.   Chronic diastolic CHF 2D echo showed EF of 65 to 70%, grade 1 diastolic dysfunction, currently compensated  Code Status: Full CODE STATUS DVT Prophylaxis:  Lovenox  Family Communication: Discussed all imaging results, lab results, explained to the patient and sister at the bedside   Disposition Plan:     Status is: Inpatient  Remains inpatient appropriate because:Inpatient level of care appropriate due to severity of illness   Dispo: The patient is from: Home              Anticipated d/c is to:  CIR              Anticipated d/c date is: 1 day              Patient currently is medically stable to d/c.  Awaiting insurance approval for CIR bed      Time Spent in minutes   25 minutes  Procedures:  MRI, CTA head and neck, 2D echo  Consultants:   Neurology CIR  Antimicrobials:   Anti-infectives (From admission, onward)   None          Medications  Scheduled Meds: . amLODipine  10 mg Oral Daily  . aspirin  300 mg Rectal Daily   Or  . aspirin  325 mg Oral Daily  . atorvastatin  80 mg Oral Daily  . clopidogrel  75 mg Oral Daily  . enoxaparin (LOVENOX) injection  40 mg Subcutaneous Q24H  . fluticasone  1 spray Each  Nare Daily  . insulin aspart  0-5 Units Subcutaneous QHS  . insulin aspart  0-9 Units Subcutaneous TID WC  . levothyroxine  88 mcg Oral QAC breakfast  . losartan  50 mg Oral QHS  . sodium chloride flush  3 mL Intravenous Once   Continuous Infusions: PRN Meds:.acetaminophen **OR** acetaminophen (TYLENOL) oral liquid 160 mg/5 mL **OR** acetaminophen, guaiFENesin-dextromethorphan      Subjective:   Jill Rose was seen and examined today.  Left-sided weakness improving slowly.  No headache or blurry vision. Patient denies dizziness, chest pain, shortness of breath, abdominal pain, N/V/D/C. No acute events overnight.    Objective:   Vitals:   01/11/20 1734 01/12/20 0008 01/12/20 0534 01/12/20 1307  BP: (!) 150/62 (!) 172/67 (!) 150/58 133/68  Pulse: 93 84 82 91  Resp: Temp: 98.5 F (36.9 C) 97.9 F (36.6 C) 98.1 F (36.7 C) 97.7 F (36.5 C)  TempSrc: Oral Oral Oral Oral  SpO2: 98% 99% 97% 97%  Weight:      Height:        Intake/Output Summary (Last 24 hours) at 01/12/2020 1333 Last data filed at 01/12/2020 0536 Gross per 24 hour  Intake --  Output 600 ml  Net -600 ml     Wt Readings from Last 3 Encounters:  01/10/20 73.3 kg  02/16/17 79.1 kg  01/28/17 81.2 kg     Exam  General: Alert and oriented x 3, NAD  Cardiovascular: S1 S2 auscultated, no murmurs, RRR  Respiratory: Clear to auscultation bilaterally, no wheezing, rales or rhonchi  Gastrointestinal: Soft, nontender, nondistended, + bowel sounds  Ext: no pedal edema bilaterally  Neuro: Left-sided weakness, slowly improving  Musculoskeletal: No digital cyanosis, clubbing  Skin: No rashes  Psych: Normal affect and demeanor, alert and oriented x3    Data Reviewed:  I have personally reviewed following labs and imaging studies  Micro Results Recent Results (from the past 240 hour(s))  SARS Coronavirus 2 by RT PCR (hospital order, performed in [facility] hospital lab) Nasopharyngeal  Nasopharyngeal Swab     Status: None   Collection Time: 01/05/20  9:26 PM   Specimen: Nasopharyngeal Swab  Result Value Ref Range Status   SARS Coronavirus 2 NEGATIVE NEGATIVE Final    Comment: (NOTE) SARS-CoV-2 target nucleic acids are NOT DETECTED.  The SARS-CoV-2 RNA is generally detectable in upper and lower respiratory specimens during the acute phase of infection. The lowest concentration of SARS-CoV-2 viral copies this assay can detect is 250 copies / mL. A negative result does  not preclude SARS-CoV-2 infection and should not be used as the sole basis for treatment or other patient management decisions.  A negative result may occur with improper specimen collection / handling, submission of specimen other than nasopharyngeal swab, presence of viral mutation(s) within the areas targeted by this assay, and inadequate number of viral copies (<250 copies / mL). A negative result must be combined with clinical observations, patient history, and epidemiological information.  Fact Sheet for Patients:   BoilerBrush.com.cy  Fact Sheet for Healthcare Providers: https://pope.com/  This test is not yet approved or  cleared by the [place] FDA and has been authorized for detection and/or diagnosis of SARS-CoV-2 by FDA under an Emergency Use Authorization (EUA).  This EUA will remain in effect (meaning this test can be used) for the duration of the COVID-19 declaration under Section 564(b)(1) of the Act, 21 U.S.C. section 360bbb-3(b)(1), unless the authorization is terminated or revoked sooner.  Performed at [facility], [address], [place], [place] [address]     Radiology Reports CT ANGIO HEAD W OR WO CONTRAST  Result Date: 01/06/2020 CLINICAL DATA:  Altered mental status. Stroke. Left-sided weakness. EXAM: CT ANGIOGRAPHY HEAD AND NECK TECHNIQUE: Multidetector CT imaging of the head and neck was performed using the  standard protocol during bolus administration of intravenous contrast. Multiplanar CT image reconstructions and MIPs were obtained to evaluate the vascular anatomy. Carotid stenosis measurements (when applicable) are obtained utilizing NASCET criteria, using the distal internal carotid diameter as the denominator. CONTRAST:  80mL OMNIPAQUE IOHEXOL 350 MG/ML SOLN COMPARISON:  CT head 01/05/2020.  MRI head and MRA head 01/06/2020 FINDINGS: CTA NECK FINDINGS Aortic arch: 2 vessel arch with bovine branching pattern. Mild atherosclerotic disease in the aortic arch. Proximal great vessels widely patent. Right carotid system: Right common carotid artery widely patent. Medial deviation of the right carotid bifurcation posterior to the pharynx. Atherosclerotic calcification in the proximal right internal carotid artery with 25% diameter stenosis. Left carotid system: Left carotid artery widely patent without stenosis. Mild atherosclerotic disease at the bifurcation. Medial deviation of the carotid bifurcation in the retropharyngeal space. Vertebral arteries: Right vertebral artery is patent to the basilar with mild scattered atherosclerotic disease Non dominant left vertebral artery with diffuse atherosclerotic disease. There are multiple areas of stenosis in the proximal and mid and distal left vertebral artery which is patent to the basilar. There is moderate to severe stenosis in the distal left vertebral artery at the skull base and in the left V3 and V4 segments. Skeleton: Cervical spondylosis and kyphosis. No acute skeletal abnormality. Other neck: Negative for mass or adenopathy in the neck. Upper chest: Lung apices clear bilaterally. Review of the MIP images confirms the above findings CTA HEAD FINDINGS Anterior circulation: Atherosclerotic calcification in the cavernous carotid bilaterally. Severe stenosis right supraclinoid internal carotid artery and mild to moderate stenosis right cavernous carotid. Left internal  carotid artery is patent without stenosis. Hypoplastic right A1 segment. Anterior and middle cerebral arteries are patent bilaterally. Severe stenosis of the superior division of left M1 at the origin. No other significant stenosis in the anterior circulation. Posterior circulation: Severe stenosis in the distal left vertebral artery. Small basilar artery with severe stenosis in the midportion. Fetal origin of the posterior cerebral artery bilaterally. Moderate stenosis of the proximal and mid right posterior cerebral artery. Venous sinuses: Limited venous contrast due to arterial phase scanning. Anatomic variants: None Review of the MIP images confirms the above findings IMPRESSION: 1. Severe intracranial  atherosclerotic disease without acute large vessel occlusion. 2. 25% diameter stenosis proximal right internal carotid artery. No significant left carotid stenosis 3. Non dominant left vertebral artery with diffuse atherosclerotic disease and multiple areas of stenosis. Severe stenosis distal left vertebral artery. Right vertebral artery is disease without significant stenosis. Electronically Signed   By: Marlan Palau M.D.   On: 01/06/2020 14:31   DG Chest 2 View  Result Date: 01/05/2020 CLINICAL DATA:  Acute ischemic stroke. EXAM: CHEST - 2 VIEW COMPARISON:  10/17/2013 FINDINGS: Lower lung volumes from prior exam. There are patchy opacities at the left greater than right lung base. Mild cardiomegaly. Aortic atherosclerosis. No significant pleural effusion. There may be minimal fluid in the fissures. No pneumothorax. No acute osseous abnormalities are seen. IMPRESSION: 1. Patchy bibasilar opacities, left greater than right, may be atelectasis or pneumonia. 2. Mild cardiomegaly.  Aortic Atherosclerosis (ICD10-I70.0). Electronically Signed   By: Narda Rutherford M.D.   On: 01/05/2020 22:43   CT HEAD WO CONTRAST  Result Date: 01/11/2020 CLINICAL DATA:  Stroke, follow-up EXAM: CT HEAD WITHOUT CONTRAST  TECHNIQUE: Contiguous axial images were obtained from the base of the skull through the vertex without intravenous contrast. 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 There is no acute intracranial hemorrhage or mass effect. No new loss of gray-white differentiation. 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 Ventricles are stable in size. Vascular: There is atherosclerotic calcification at the skull base. Skull: Calvarium is unremarkable. Sinuses/Orbits: Layering secretions in the right sphenoid sinus. Other: Mastoid air cells are clear. IMPRESSION: No acute intracranial hemorrhage. Recent small infarcts better seen on MRI. Stable chronic findings detailed above. Electronically Signed   By: Guadlupe Spanish M.D.   On: 01/11/2020 13:21   CT HEAD WO CONTRAST  Result Date: 01/05/2020 CLINICAL DATA:  Increased left leg weakness. EXAM: CT HEAD WITHOUT CONTRAST TECHNIQUE: Contiguous axial images were obtained from the base of the skull through the vertex without intravenous contrast. COMPARISON:  January 26, 2017 FINDINGS: Brain: There is mild cerebral atrophy with widening of the extra-axial spaces and ventricular dilatation. There are areas of decreased attenuation within the white matter tracts of the supratentorial brain, consistent with microvascular disease changes. Small chronic left basal ganglia lacunar infarcts are noted. Vascular: No hyperdense vessel or unexpected calcification. Skull: Normal. Negative for fracture or focal lesion. Sinuses/Orbits: There is mild sphenoid sinus mucosal thickening. Other: None. IMPRESSION: 1. Generalized cerebral atrophy. 2. Small chronic left basal ganglia lacunar infarcts. 3. No acute intracranial abnormality. Electronically  Signed   By: Aram Candela M.D.   On: 01/05/2020 15:42   CT ANGIO NECK W OR WO CONTRAST  Result Date: 01/06/2020 CLINICAL DATA:  Altered mental status. Stroke. Left-sided weakness. EXAM: CT ANGIOGRAPHY HEAD AND NECK TECHNIQUE: Multidetector CT imaging of the head and neck was performed using the standard protocol during bolus administration of intravenous contrast. Multiplanar CT image reconstructions and MIPs were obtained to evaluate the vascular anatomy. Carotid stenosis measurements (when applicable) are obtained utilizing NASCET criteria, using the distal internal carotid diameter as the denominator. CONTRAST:  42mL OMNIPAQUE IOHEXOL 350 MG/ML SOLN COMPARISON:  CT head 01/05/2020.  MRI head and MRA head 01/06/2020 FINDINGS: CTA NECK FINDINGS Aortic arch: 2 vessel arch with bovine branching pattern. Mild atherosclerotic disease in the aortic arch.  Proximal great vessels widely patent. Right carotid system: Right common carotid artery widely patent. Medial deviation of the right carotid bifurcation posterior to the pharynx. Atherosclerotic calcification in the proximal right internal carotid artery with 25% diameter stenosis. Left carotid system: Left carotid artery widely patent without stenosis. Mild atherosclerotic disease at the bifurcation. Medial deviation of the carotid bifurcation in the retropharyngeal space. Vertebral arteries: Right vertebral artery is patent to the basilar with mild scattered atherosclerotic disease Non dominant left vertebral artery with diffuse atherosclerotic disease. There are multiple areas of stenosis in the proximal and mid and distal left vertebral artery which is patent to the basilar. There is moderate to severe stenosis in the distal left vertebral artery at the skull base and in the left V3 and V4 segments. Skeleton: Cervical spondylosis and kyphosis. No acute skeletal abnormality. Other neck: Negative for mass or adenopathy in the neck. Upper chest: Lung apices  clear bilaterally. Review of the MIP images confirms the above findings CTA HEAD FINDINGS Anterior circulation: Atherosclerotic calcification in the cavernous carotid bilaterally. Severe stenosis right supraclinoid internal carotid artery and mild to moderate stenosis right cavernous carotid. Left internal carotid artery is patent without stenosis. Hypoplastic right A1 segment. Anterior and middle cerebral arteries are patent bilaterally. Severe stenosis of the superior division of left M1 at the origin. No other significant stenosis in the anterior circulation. Posterior circulation: Severe stenosis in the distal left vertebral artery. Small basilar artery with severe stenosis in the midportion. Fetal origin of the posterior cerebral artery bilaterally. Moderate stenosis of the proximal and mid right posterior cerebral artery. Venous sinuses: Limited venous contrast due to arterial phase scanning. Anatomic variants: None Review of the MIP images confirms the above findings IMPRESSION: 1. Severe intracranial atherosclerotic disease without acute large vessel occlusion. 2. 25% diameter stenosis proximal right internal carotid artery. No significant left carotid stenosis 3. Non dominant left vertebral artery with diffuse atherosclerotic disease and multiple areas of stenosis. Severe stenosis distal left vertebral artery. Right vertebral artery is disease without significant stenosis. Electronically Signed   By: Marlan Palauharles  Clark M.D.   On: 01/06/2020 14:31   MR ANGIO HEAD WO CONTRAST  Result Date: 01/06/2020 CLINICAL DATA:  Infarcts on MRI brain EXAM: MRA HEAD WITHOUT CONTRAST TECHNIQUE: Angiographic images of the Circle of Willis were obtained using MRA technique without intravenous contrast. COMPARISON:  None. FINDINGS: Motion artifact is present. Intracranial internal carotid arteries are patent with atherosclerotic irregularity and at least moderate stenosis of the supraclinoid portion. Middle and anterior  cerebral arteries are patent. Right A1 ACA is congenitally small or stenotic. Proximal left M2 MCA moderate to marked stenosis. Intracranial vertebral arteries, basilar artery, posterior cerebral arteries are patent. Moderate to marked stenosis of the mid basilar. Bilateral PCA atherosclerotic irregularity with right P2 stenosis. Bilateral posterior communicating arteries are present. There is no aneurysm. IMPRESSION: No proximal intracranial vessel occlusion. Multifocal atherosclerotic irregularity and stenoses. Electronically Signed   By: Guadlupe SpanishPraneil  Patel M.D.   On: 01/06/2020 11:20   MR BRAIN WO CONTRAST  Result Date: 01/06/2020 CLINICAL DATA:  Left-sided weakness over the last week. EXAM: MRI HEAD WITHOUT CONTRAST TECHNIQUE: Multiplanar, multiecho pulse sequences of the brain and surrounding structures were obtained without intravenous contrast. COMPARISON:  CT head without contrast 01/05/2020 FINDINGS: Brain: Arcs the diffusion-weighted images demonstrate acute/subacute nonhemorrhagic infarcts involving the right corona radiata. Additional subcortical white matter infarcts are present in the posterior right temporal and right occipital lobes. Minimal cortical involvement is present. Extensive white matter  disease is present bilaterally. Remote lacunar infarcts are present within the corona radiata bilaterally. Remote lacunar infarcts are also present in the left lentiform nucleus. Remote lacunar infarct is present in the left thalamus. Remote white matter infarcts are present adjacent to the atrium of the right lateral ventricle. A remote lacunar infarct is present in the left paramedian pons. White matter changes extend through the brainstem. Remote lacunar infarcts are present in the posteroinferior right cerebellum. The ventricles are proportionate to the degree of atrophy. No significant extraaxial fluid collection is present. The internal auditory canals are within normal limits. Vascular: Flow is  present in the major intracranial arteries. Skull and upper cervical spine: Mild degenerative changes are present in the upper cervical spine. Craniocervical junction is normal. Marrow signal is normal. Sinuses/Orbits: A fluid level is present in the right sphenoid sinus. The paranasal sinuses and mastoid air cells are otherwise clear. Bilateral lens replacements are noted. Globes and orbits are otherwise unremarkable. IMPRESSION: 1. Acute/subacute nonhemorrhagic infarcts involving the right corona radiata and posterior right temporal and occipital lobes. Findings correspond to the subacute time frame of left-sided weakness. 2. Remote lacunar infarcts of the corona radiata bilaterally, left thalamus, and left paramedian pons. 3. Extensive white matter disease likely reflects the sequela of chronic microvascular ischemia. 4. Right sphenoid sinus disease. Electronically Signed   By: San Morelle M.D.   On: 01/06/2020 05:33   ECHOCARDIOGRAM COMPLETE  Result Date: 01/06/2020    ECHOCARDIOGRAM REPORT   Patient Name:   HARGUN SPURLING Date of Exam: 01/06/2020 Medical Rec #:  [id number]      Height:       61.0 in Accession #:    [id number]     Weight:       174.4 lb Date of Birth:  [birth year]       BSA:          1.782 m Patient Age:    83 years       BP:           179/70 mmHg Patient Gender: F              HR:           88 bpm. Exam Location:  Inpatient Procedure: 2D Echo Indications:     Stroke 434.91 / I163.9  History:         Patient has prior history of Echocardiogram examinations, most                  recent 01/28/2017. TIA; Risk Factors:Hypertension and Diabetes.                  Thyroid disease.  Sonographer:     Darlina Sicilian RDCS Referring Phys:  [id number] [employer] [facility] Diagnosing Phys: Fransico Him MD  Sonographer Comments: No IV access. IMPRESSIONS  1. There is a mid cavitary resting gradient of 84mmHg due to hyperdynamic LVF.     Marland Kitchen Left ventricular ejection fraction, by estimation, is 65 to 70%. The left  ventricle has normal function. The left ventricle has no regional wall motion abnormalities. Left ventricular diastolic parameters are consistent with Grade I diastolic dysfunction (impaired relaxation). Elevated left ventricular end-diastolic pressure.  2. Right ventricular systolic function is normal. The right ventricular size is normal. Tricuspid regurgitation signal is inadequate for assessing PA pressure.  3. The mitral valve is normal in structure. No evidence of mitral valve regurgitation. Mild mitral stenosis. The mean mitral valve gradient is 6.5 mmHg.  4. The aortic valve is tricuspid. Aortic valve regurgitation is not visualized. Mild to moderate aortic valve sclerosis/calcification is present, without any evidence of aortic stenosis. Aortic valve mean gradient measures 9.0 mmHg. Aortic valve Vmax measures 1.97 m/s.  5. Aortic dilatation noted. 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  6. The inferior vena cava is normal in size with greater than 50% respiratory variability, suggesting right atrial pressure of 3 mmHg. FINDINGS  Left Ventricle: There is a mid cavitary resting gradient of due to hyperdynamic LVF. Left ventricular ejection fraction, by estimation, is 65 to 70%. The left ventricle has normal function. The left ventricle has no regional wall motion abnormalities. The left ventricular internal cavity size was normal in size. There is no left ventricular hypertrophy. Left ventricular diastolic parameters are consistent with Grade I diastolic dysfunction (impaired relaxation). Elevated left ventricular end-diastolic pressure. Right Ventricle: The right ventricular size is normal. No increase in right ventricular wall thickness. Right ventricular systolic function is normal. Tricuspid regurgitation signal is inadequate for assessing PA pressure. Left Atrium: Left atrial size was normal in size. Right Atrium: Right atrial size was normal in size. Pericardium: There  is no evidence of pericardial effusion. Mitral Valve: The mitral valve is normal in structure. Normal mobility of the mitral valve leaflets. Severe mitral annular calcification. No evidence of mitral valve regurgitation. Mild mitral valve stenosis. MV peak gradient, 18.6 mmHg. The mean mitral valve gradient is 6.5 mmHg. Tricuspid Valve: The tricuspid valve is normal in structure. Tricuspid valve regurgitation is not demonstrated. No evidence of tricuspid stenosis. Aortic Valve: The aortic valve is tricuspid. Aortic valve regurgitation is not visualized. Mild to moderate aortic valve sclerosis/calcification is present, without any evidence of aortic stenosis. Aortic valve mean gradient measures 9.0 mmHg. Aortic valve peak gradient measures 15.5 mmHg. Aortic valve area, by VTI measures 1.06 cm. Pulmonic Valve: The pulmonic valve was normal in structure. Pulmonic valve regurgitation is trivial. No evidence of pulmonic stenosis. Aorta: Aortic dilatation noted. 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 Venous: The inferior vena cava was not well visualized. The inferior vena cava is normal in size with greater than 50% respiratory variability, suggesting right atrial pressure of 3 mmHg. IAS/Shunts: No atrial level shunt detected by color flow Doppler.  LEFT VENTRICLE PLAX 2D LVIDd:         3.80 cm  Diastology LVIDs:         2.60 cm  LV e' lateral:   4.46 cm/s LV PW:         1.00 cm  LV E/e' lateral: 26.2 LV IVS:        1.10 cm  LV e' medial:    4.13 cm/s LVOT diam:     1.60 cm  LV E/e' medial:  28.3 LV SV:         38 LV SV Index:   21 LVOT Area:     2.01 cm  RIGHT VENTRICLE TAPSE (M-mode): 1.6 cm LEFT ATRIUM             Index LA diam:        3.10 cm 1.74 cm/m LA Vol (A2C):   30.1 ml 16.89 ml/m LA Vol (A4C):   29.3 ml 16.44 ml/m LA Biplane Vol: 34.9 ml 19.59 ml/m  AORTIC VALVE AV Area (Vmax):    1.30 cm AV Area (Vmean):   1.34 cm AV Area (VTI):     1.06 cm AV Vmax:  197.00 cm/s AV  Vmean:          135.000 cm/s AV VTI:            0.355 m AV Peak Grad:      15.5 mmHg AV Mean Grad:      9.0 mmHg LVOT Vmax:         127.00 cm/s LVOT Vmean:        89.900 cm/s LVOT VTI:          0.187 m LVOT/AV VTI ratio: 0.53  AORTA Ao Root diam: 3.40 cm MITRAL VALVE MV Area (PHT): 2.48 cm     SHUNTS MV Peak grad:  18.6 mmHg    Systemic VTI:  0.19 m MV Mean grad:  6.5 mmHg     Systemic Diam: 1.60 cm MV Vmax:       2.16 m/s MV Vmean:      117.5 cm/s MV Decel Time: 306 msec MV E velocity: 117.00 cm/s MV A velocity: 209.00 cm/s MV E/A ratio:  0.56 Armanda Magic MD Electronically signed by Armanda Magic MD Signature Date/Time: 01/06/2020/8:59:43 AM    Final (Updated)     Lab Data:  CBC: Recent Labs  Lab 01/05/20 1401  WBC 11.1*  NEUTROABS 6.5  HGB 13.0  HCT 41.7  MCV 87.4  PLT 258   Basic Metabolic Panel: Recent Labs  Lab 01/05/20 1401 01/06/20 0842  NA 132* 137  K 5.3* 4.3  CL 98 102  CO2 22 24  GLUCOSE 135* 161*  BUN 15 13  CREATININE 0.78 0.72  CALCIUM 9.3 9.1   GFR: Estimated Creatinine Clearance: 54.8 mL/min (by C-G formula based on SCr of 0.72 mg/dL). Liver Function Tests: Recent Labs  Lab 01/05/20 1401  AST 18  ALT 12  ALKPHOS 57  BILITOT 0.3  PROT 8.0  ALBUMIN 3.5   No results for input(s): LIPASE, AMYLASE in the last 168 hours. No results for input(s): AMMONIA in the last 168 hours. Coagulation Profile: Recent Labs  Lab 01/05/20 1401  INR 1.0   Cardiac Enzymes: No results for input(s): CKTOTAL, CKMB, CKMBINDEX, TROPONINI in the last 168 hours. BNP (last 3 results) No results for input(s): PROBNP in the last 8760 hours. HbA1C: No results for input(s): HGBA1C in the last 72 hours. CBG: Recent Labs  Lab 01/11/20 1111 01/11/20 1618 01/11/20 2142 01/12/20 0535 01/12/20 1106  GLUCAP 121* 234* 218* 130* 190*   Lipid Profile: No results for input(s): CHOL, HDL, LDLCALC, TRIG, CHOLHDL, LDLDIRECT in the last 72 hours. Thyroid Function Tests: No results  for input(s): TSH, T4TOTAL, FREET4, T3FREE, THYROIDAB in the last 72 hours. Anemia Panel: No results for input(s): VITAMINB12, FOLATE, FERRITIN, TIBC, IRON, RETICCTPCT in the last 72 hours. Urine analysis: No results found for: COLORURINE, APPEARANCEUR, LABSPEC, PHURINE, GLUCOSEU, HGBUR, BILIRUBINUR, KETONESUR, PROTEINUR, UROBILINOGEN, NITRITE, LEUKOCYTESUR   Saoirse Legere M.D. [facility] 01/12/2020, 1:33 PM   Call night coverage person covering after 7pm

## 2020-01-12 NOTE — Progress Notes (Signed)
Inpatient Rehabilitation-Admissions Coordinator   Still waiting on an insurance determination for CIR. Will follow up once there is an update.   Cheri Rous, OTR/L  Rehab Admissions Coordinator  [phone] 01/12/2020 3:47 PM

## 2020-01-12 NOTE — Progress Notes (Signed)
Physical Therapy Treatment Patient Details Name: Jill Rose MRN: [id number] DOB: 1940-09-01 Today's Date: 01/12/2020    History of Present Illness Pt is a 79 y/o female admitted secondary to worsening L sided weakness. 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 PMH includes HTN.     PT Comments    Continuing work on functional mobility and activity tolerance; Tends to take short, staccato steps with wide BOS with initial attempt at ambulation;  Session focused on progressive ambulation, with emphasis on weight shifting fully into stance L and R (in particular, L) to allow for complete unweighing and advancing of swing LE; Notable improvements in balance and mobility from last week; Continue to recommend comprehensive inpatient rehab (CIR) for post-acute therapy needs.   Follow Up Recommendations  CIR     Equipment Recommendations  Rolling walker with 5" wheels;3in1 (PT)    Recommendations for Other Services       Precautions / Restrictions Precautions Precautions: Fall    Mobility  Bed Mobility                  Transfers Overall transfer level: Needs assistance Equipment used: Rolling walker (2 wheeled) Transfers: Sit to/from Stand Sit to Stand: Mod assist         General transfer comment: Light mod assist to pwer up; Cues for hand placement - pulled up on RW with both hands despite cues  Ambulation/Gait Ambulation/Gait assistance: Mod assist;+2 physical assistance Gait Distance (Feet): 22 Feet Assistive device: Rolling walker (2 wheeled) Gait Pattern/deviations: Step-to pattern;Decreased step length - right;Decreased step length - left;Decreased stance time - right;Decreased stance time - left;Decreased weight shift to right;Decreased weight shift to left     General Gait Details: Noting decr weight shift fully into stance both R and L, with wide BOS and very short steps; more difficulty with weight shift onto L, leading to  shorter step length R; small steps, resembling festination; notable improving weight shift into stance and step lenght with multimodal cues for slow, long steps   Stairs             Wheelchair Mobility    Modified Rankin (Stroke Patients Only) Modified Rankin (Stroke Patients Only) Pre-Morbid Rankin Score: No symptoms Modified Rankin: Moderately severe disability     Balance Overall balance assessment: Needs assistance   Sitting balance-Leahy Scale: Good       Standing balance-Leahy Scale: Poor                              Cognition Arousal/Alertness: Awake/alert Behavior During Therapy: WFL for tasks assessed/performed;Impulsive (emotionally labile) Overall Cognitive Status: Impaired/Different from baseline                               Problem Solving: Requires tactile cues;Requires verbal cues General Comments: Needing multimodal cues/reminders for weight shifting and slow, long steps with gait      Exercises      General Comments General comments (skin integrity, edema, etc.): Pt's sister present      Pertinent Vitals/Pain Pain Assessment: No/denies pain Pain Intervention(s): Monitored during session    Home Living                      Prior Function            PT Goals (current goals can now  be found in the care plan section) Acute Rehab PT Goals Patient Stated Goal: Emotional and wanting to see her husband PT Goal Formulation: With patient Time For Goal Achievement: 01/20/20 Potential to Achieve Goals: Good Progress towards PT goals: Progressing toward goals    Frequency    Min 4X/week      PT Plan Current plan remains appropriate;Frequency needs to be updated    Co-evaluation              AM-PAC PT "6 Clicks" Mobility   Outcome Measure  Help needed turning from your back to your side while in a flat bed without using bedrails?: A Little Help needed moving from lying on your back to sitting  on the side of a flat bed without using bedrails?: A Lot Help needed moving to and from a bed to a chair (including a wheelchair)?: A Lot Help needed standing up from a chair using your arms (e.g., wheelchair or bedside chair)?: A Lot Help needed to walk in hospital room?: A Lot Help needed climbing 3-5 steps with a railing? : Total 6 Click Score: 12    End of Session Equipment Utilized During Treatment: Gait belt Activity Tolerance: Patient tolerated treatment well Patient left: in chair;with call bell/phone within reach;with chair alarm set;with family/visitor present Nurse Communication: Mobility status PT Visit Diagnosis: Unsteadiness on feet (R26.81);Difficulty in walking, not elsewhere classified (R26.2);Hemiplegia and hemiparesis Hemiplegia - Right/Left: Left Hemiplegia - caused by: Cerebral infarction     Time: 0910-0928 PT Time Calculation (min) (ACUTE ONLY): 18 min  Charges:  $Gait Training: 8-22 mins                     Roney Marion, PT  Acute Rehabilitation Services Pager [phone] Office [place] 01/12/2020, 10:47 AM

## 2020-01-13 LAB — GLUCOSE, CAPILLARY
Glucose-Capillary: 148 mg/dL — ABNORMAL HIGH (ref 70–99)
Glucose-Capillary: 170 mg/dL — ABNORMAL HIGH (ref 70–99)
Glucose-Capillary: 151 mg/dL — ABNORMAL HIGH (ref 70–99)
Glucose-Capillary: 211 mg/dL — ABNORMAL HIGH (ref 70–99)

## 2020-01-13 MED ORDER — ATORVASTATIN CALCIUM 80 MG PO TABS: 80.0000 mg | ORAL_TABLET | Freq: Every day | ORAL | 0 refills | Status: DC

## 2020-01-13 MED ORDER — ASPIRIN 325 MG PO TABS: 325.0000 mg | ORAL_TABLET | Freq: Every day | ORAL | 0 refills | Status: DC

## 2020-01-13 MED ORDER — AMLODIPINE BESYLATE 10 MG PO TABS: 10.0000 mg | ORAL_TABLET | Freq: Every day | ORAL | 0 refills | Status: DC

## 2020-01-13 MED ORDER — CLOPIDOGREL BISULFATE 75 MG PO TABS: 75.0000 mg | ORAL_TABLET | Freq: Every day | ORAL | 0 refills | Status: DC

## 2020-01-13 MED ORDER — METFORMIN HCL 500 MG PO TABS: 500.0000 mg | ORAL_TABLET | Freq: Two times a day (BID) | ORAL | 0 refills | Status: DC

## 2020-01-13 NOTE — Progress Notes (Signed)
Occupational Therapy Treatment Patient Details Name: Jill Rose MRN: [id number] DOB: 11-29-1940 Today's Date: 01/13/2020    History of present illness Pt is a 79 y/o female admitted secondary to worsening L sided weakness. 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 PMH includes HTN.    OT comments  Pt progressing with OT goals and motivated to return to PLOF. Guided pt in ADLs this AM with pt improving ADL transfers to [facility] to Min A with intermittent assistance to advance walker. Pt varied from Min A to Mod A for sit to stand transfers with RW. Overall Mod A for LB ADLs due to impaired dynamic standing balance (see below) and Setup to Min A for UB ADLs today. Pt continues to require cues for safe sequencing with RW, physical assist to decrease fall risk, and cues for problem solving during ADLs. Continue to recommend CIR to maximize ADL/mobility independence.    Follow Up Recommendations  CIR    Equipment Recommendations  3 in 1 bedside commode    Recommendations for Other Services      Precautions / Restrictions Precautions Precautions: Fall Restrictions Weight Bearing Restrictions: No       Mobility Bed Mobility               General bed mobility comments: pt OOB in recliner chair upon arrival  Transfers Overall transfer level: Needs assistance Equipment used: Rolling walker (2 wheeled) Transfers: Sit to/from Stand;Stand Pivot Transfers Sit to Stand: Min assist;Mod assist Stand pivot transfers: Min assist       General transfer comment: Min to Mod A for sit to stand transfers to power up, overall Min A for SPT with RW    Balance Overall balance assessment: Needs assistance   Sitting balance-Leahy Scale: Good       Standing balance-Leahy Scale: Poor Standing balance comment: Reliant on UE and external support, strong posterior lean with fatigue                           ADL either performed or assessed with  clinical judgement   ADL Overall ADL's : Needs assistance/impaired     Grooming: Set up;Sitting;Oral care;Brushing hair Grooming Details (indicate cue type and reason): Setup for oral care seated in recliner Upper Body Bathing: Minimal assistance;Sitting Upper Body Bathing Details (indicate cue type and reason): Min A to bathe back  Lower Body Bathing: Moderate assistance Lower Body Bathing Details (indicate cue type and reason): Mod A to bathe B lower legs safely and bottom in standing Upper Body Dressing : Set up;Sitting Upper Body Dressing Details (indicate cue type and reason): Setup to don hospital gown seated Lower Body Dressing: Moderate assistance;Sit to/from stand Lower Body Dressing Details (indicate cue type and reason): Mod A to don socks, don brief Toilet Transfer: Minimal assistance;Stand-pivot;BSC;Cueing for safety;Cueing for sequencing Toilet Transfer Details (indicate cue type and reason): Min A for RW SPT recliner <> BSC. Required cues to pick up feet, manuever RW and maintain steadiness in standing Toileting- Clothing Manipulation and Hygiene: Moderate assistance;Sit to/from stand Toileting - Clothing Manipulation Details (indicate cue type and reason): Mod A for posterior hygiene in standing and pulling up briefs. Pt able to doff briefs with Min A       General ADL Comments: Pt with improved steadiness during ADL transfers and improving problem solving and directing for sequencing, but continues to require cues for safe completion     Vision  Perception     Praxis      Cognition Arousal/Alertness: Awake/alert Behavior During Therapy: WFL for tasks assessed/performed;Impulsive Overall Cognitive Status: Impaired/Different from baseline Area of Impairment: Safety/judgement;Awareness                         Safety/Judgement: Decreased awareness of deficits;Decreased awareness of safety   Problem Solving: Requires tactile cues;Requires verbal  cues General Comments: requires multimodal cues for hand placement, RW manuevering, and taking bigger steps        Exercises     Shoulder Instructions       General Comments      Pertinent Vitals/ Pain       Pain Assessment: No/denies pain  Home Living                                          Prior Functioning/Environment              Frequency  Min 2X/week        Progress Toward Goals  OT Goals(current goals can now be found in the care plan section)  Progress towards OT goals: Progressing toward goals  Acute Rehab OT Goals Patient Stated Goal: go to CIR OT Goal Formulation: With patient/family Time For Goal Achievement: 01/21/20 Potential to Achieve Goals: Good ADL Goals Pt Will Perform Grooming: with modified independence;sitting Pt Will Perform Upper Body Bathing: with modified independence;sitting Pt Will Perform Lower Body Bathing: with min guard assist;sit to/from stand;with adaptive equipment Pt Will Transfer to Toilet: with min guard assist;ambulating Pt Will Perform Toileting - Clothing Manipulation and hygiene: with adaptive equipment;sit to/from stand;with min guard assist Pt/caregiver will Perform Home Exercise Program: Increased strength;Left upper extremity;Independently  Plan Discharge plan remains appropriate    Co-evaluation                 AM-PAC OT "6 Clicks" Daily Activity     Outcome Measure   Help from another person eating meals?: None Help from another person taking care of personal grooming?: A Little Help from another person toileting, which includes using toliet, bedpan, or urinal?: A Lot Help from another person bathing (including washing, rinsing, drying)?: A Little Help from another person to put on and taking off regular upper body clothing?: A Little Help from another person to put on and taking off regular lower body clothing?: A Lot 6 Click Score: 17    End of Session Equipment Utilized  During Treatment: Gait belt;Rolling walker  OT Visit Diagnosis: Unsteadiness on feet (R26.81);Other abnormalities of gait and mobility (R26.89);Pain;Hemiplegia and hemiparesis;Ataxia, unspecified (R27.0) Hemiplegia - Right/Left: Left Hemiplegia - dominant/non-dominant: Non-Dominant Hemiplegia - caused by: Cerebral infarction   Activity Tolerance Patient tolerated treatment well   Patient Left in chair;with call bell/phone within reach;with chair alarm set;with nursing/sitter in room;with family/visitor present   Nurse Communication Mobility status        Time: 7322-0254 OT Time Calculation (min): 39 min  Charges: OT General Charges $OT Visit: 1 Visit OT Treatments $Self Care/Home Management : 38-52 mins  Lorre Munroe, OTR/L   Lorre Munroe 01/13/2020, 9:21 AM

## 2020-01-13 NOTE — Discharge Summary (Signed)
Discharge Summary  Jill Rose CWU:[id number] DOB: 20-May-1941  PCP: Ralene Ok, MD  Admit date: 01/05/2020 Discharge date: 01/13/2020  Time spent: 35 minutes  Recommendations for Outpatient Follow-up:  1. Follow-up with neurology 2. Follow-up with your PCP 3. Take your medications as prescribed 4. Continue PT OT with assistance and fall precautions  Discharge Diagnoses:  Active Hospital Problems   Diagnosis Date Noted  . Acute ischemic stroke (HCC) 01/05/2020  . Controlled type 2 diabetes mellitus with hyperglycemia (HCC)   . Morbid obesity (HCC)   . Leukocytosis   . History of TIA (transient ischemic attack)   . Acute CVA (cerebrovascular accident) (HCC) 01/06/2020  . Hypothyroidism 01/26/2017  . Essential hypertension     Resolved Hospital Problems  No resolved problems to display.    Discharge Condition: Stable  Diet recommendation: Resume previous diet  Vitals:   01/12/20 2345 01/13/20 0500  BP: (!) 140/59 (!) 137/59  Pulse: 80 81  Resp: 17 18  Temp: 98.4 F (36.9 C) 97.8 F (36.6 C)  SpO2: 97% 95%    History of present illness:  Patient is a 79 year old female with history of hypertension, diabetes type 2, hypothyroidism, history of TIA who presented with left-sided weakness. She had weakness more on the left lower extremity than the upper. It hadbeen ongoing for last 1 week. She also feels that her speech is slurred. She had difficulty getting up the stairs due to weakness of the leg. CT head on presentation showed old left basal ganglia strokes. MRI of the brain showed acute/subacute nonhemorrhagic right-sided infarct. Patient admitted for stroke work-up. Neurology consulted. Work up completed.Physical therapy recommended inpatient rehab.  01/13/20: Seen and examined.  No new complaints.  Awaiting placement at [facility].   Hospital Course:  Principal Problem:   Acute ischemic stroke [facility]) Active Problems:   Hypothyroidism   Essential  hypertension   Acute CVA (cerebrovascular accident) (HCC)   Controlled type 2 diabetes mellitus with hyperglycemia (HCC)   Morbid obesity (HCC)   Leukocytosis   History of TIA (transient ischemic attack)  Acute ischemic stroke [facility]) -Patient presented with left-sided weakness, slurred speech - MRI brain acute/subacute nonhemorrhagic infarcts involving the right corona radiata and posterior right temporal and occipital lobes,remote lacunar infarcts of the corona radiata bilaterally, left thalamus, and left paramedian pons. - MRA showed multifocal atherosclerosis no proximal occlusion.  -CTA head and neck showed severe intracranial atherosclerosis without large vessel occlusion. Proximal right ICA has 25% stenosis.  No significant left carotid stenosis.  Diffuse atherosclerotic disease in multiple areas of stenosis, severe stenosis distal left vertebral artery - LDL 196, goal less than 70. Hemoglobin A1c  7.6, goal less than 7.0 -2D echo showed EF of 65 to 70%, grade 1 diastolic dysfunction, no atrial shunt, no cardiac source of emboli -Neurology recommended aspirin 325 mg daily and Plavix for 3 months then Plavix alone -Started on Lipitor  -Repeat CT head on 6/27 showed no acute intracranial abnormality  Active problems Hypertension: -BP improving, continue losartan, added amlodipine 10 mg daily   Hyperlipidemia: - LDL of 196, goal less than 70, continue Lipitor 80 mg.   Hypothyroidism: - Continue Synthyroid  Diabetes mellitus type II, NIDDM -Hemoglobin A1c 7.6, goal less than 7.0 Continue sliding scale insulin while inpatient -Metformin on discharge Follow-up with your PCP  Obesity Estimated body mass index is 30.53 kg/m as calculated from the following:   Height as of this encounter: 5\' 1"  (1.549 m).   Weight as of this encounter:  73.3 kg.   Chronic diastolic CHF 2D echo showed EF of 65 to 70%, grade 1 diastolic dysfunction, currently compensated  Code Status:  Full CODE STATUS   Procedures:  MRI, CTA head and neck, 2D echo  Consultants:   Neurology [facility]    Discharge Exam: BP (!) 137/59 (BP Location: Right Arm)   Pulse 81   Temp 97.8 F (36.6 C) (Oral)   Resp 18   Ht  (1.549 m)   Wt 73.3 kg   SpO2 95%   BMI 30.53 kg/m  . General: 79 y.o. year-old female well developed well nourished in no acute distress.  Alert and interactive. . Cardiovascular: Regular rate and rhythm with no rubs or gallops.  No thyromegaly or JVD noted.   Marland Kitchen Respiratory: Clear to auscultation with no wheezes or rales. Good inspiratory effort. . Abdomen: Soft nontender nondistended with normal bowel sounds x4 quadrants. . Musculoskeletal: No lower extremity edema. 2/4 pulses in all 4 extremities. Marland Kitchen Psychiatry: Mood is appropriate for condition and setting  Discharge Instructions You were cared for by a hospitalist during your hospital stay. If you have any questions about your discharge medications or the care you received while you were in the hospital after you are discharged, you can call the unit and asked to speak with the hospitalist on call if the hospitalist that took care of you is not available. Once you are discharged, your primary care physician will handle any further medical issues. Please note that NO REFILLS for any discharge medications will be authorized once you are discharged, as it is imperative that you return to your primary care physician (or establish a relationship with a primary care physician if you do not have one) for your aftercare needs so that they can reassess your need for medications and monitor your lab values.   Allergies as of 01/13/2020      Reactions   Ammonia Shortness Of Breath, Itching, Swelling   Peroxide [hydrogen Peroxide] Shortness Of Breath, Itching, Swelling      Medication List    STOP taking these medications   aspirin EC 81 MG tablet Replaced by: aspirin 325 MG tablet     TAKE these medications     amLODipine 10 MG tablet Commonly known as: NORVASC Take 1 tablet (10 mg total) by mouth daily.   aspirin 325 MG tablet Take 1 tablet (325 mg total) by mouth daily. Replaces: aspirin EC 81 MG tablet   atorvastatin 80 MG tablet Commonly known as: LIPITOR Take 1 tablet (80 mg total) by mouth daily.   b complex vitamins tablet Take 1 tablet by mouth daily with lunch.   BERBERINE COMPLEX PO Take 1 tablet by mouth daily with lunch.   clopidogrel 75 MG tablet Commonly known as: PLAVIX Take 1 tablet (75 mg total) by mouth daily.   levothyroxine 88 MCG tablet Commonly known as: SYNTHROID Take 88 mcg by mouth daily before breakfast.   losartan 50 MG tablet Commonly known as: COZAAR Take 50 mg by mouth at bedtime.   metFORMIN 500 MG tablet Commonly known as: Glucophage Take 1 tablet (500 mg total) by mouth 2 (two) times daily with a meal.   VITAMIN B-12 PO Take 1 tablet by mouth daily with lunch.   VITAMIN D3 PO Take 1 tablet by mouth daily with lunch.   VITAMIN K2 PO Take 1 tablet by mouth daily with lunch.            Durable Medical Equipment  (  From admission, onward)         Start     Ordered   01/13/20 0700  For home use only DME Walker rolling  Once       Question Answer Comment  Walker: With 5 Inch Wheels   Patient needs a walker to treat with the following condition Ambulatory dysfunction      01/13/20 0522   01/13/20 0523  For home use only DME 3 n 1  Once        01/13/20 0522         Allergies  Allergen Reactions  . Ammonia Shortness Of Breath, Itching and Swelling  . Peroxide [Hydrogen Peroxide] Shortness Of Breath, Itching and Swelling    Follow-up Information    Ralene Ok, MD. Call in 1 day(s).   Specialty: Internal Medicine Why: Please call for a post hospital follow up appointment Contact information: [address] [facility] [address] [phone]        [facility]. Call.   Why: please call for a post  hospital follow up appointment Contact information: [address]     [address] [place] [address] [phone]               The results of significant diagnostics from this hospitalization (including imaging, microbiology, ancillary and laboratory) are listed below for reference.    Significant Diagnostic Studies: CT ANGIO HEAD W OR WO CONTRAST  Result Date: 01/06/2020 CLINICAL DATA:  Altered mental status. Stroke. Left-sided weakness. EXAM: CT ANGIOGRAPHY HEAD AND NECK TECHNIQUE: Multidetector CT imaging of the head and neck was performed using the standard protocol during bolus administration of intravenous contrast. Multiplanar CT image reconstructions and MIPs were obtained to evaluate the vascular anatomy. Carotid stenosis measurements (when applicable) are obtained utilizing NASCET criteria, using the distal internal carotid diameter as the denominator. CONTRAST:  80mL OMNIPAQUE IOHEXOL 350 MG/ML SOLN COMPARISON:  CT head 01/05/2020.  MRI head and MRA head 01/06/2020 FINDINGS: CTA NECK FINDINGS Aortic arch: 2 vessel arch with bovine branching pattern. Mild atherosclerotic disease in the aortic arch. Proximal great vessels widely patent. Right carotid system: Right common carotid artery widely patent. Medial deviation of the right carotid bifurcation posterior to the pharynx. Atherosclerotic calcification in the proximal right internal carotid artery with 25% diameter stenosis. Left carotid system: Left carotid artery widely patent without stenosis. Mild atherosclerotic disease at the bifurcation. Medial deviation of the carotid bifurcation in the retropharyngeal space. Vertebral arteries: Right vertebral artery is patent to the basilar with mild scattered atherosclerotic disease Non dominant left vertebral artery with diffuse atherosclerotic disease. There are multiple areas of stenosis in the proximal and mid and distal left vertebral artery which is patent to the  basilar. There is moderate to severe stenosis in the distal left vertebral artery at the skull base and in the left V3 and V4 segments. Skeleton: Cervical spondylosis and kyphosis. No acute skeletal abnormality. Other neck: Negative for mass or adenopathy in the neck. Upper chest: Lung apices clear bilaterally. Review of the MIP images confirms the above findings CTA HEAD FINDINGS Anterior circulation: Atherosclerotic calcification in the cavernous carotid bilaterally. Severe stenosis right supraclinoid internal carotid artery and mild to moderate stenosis right cavernous carotid. Left internal carotid artery is patent without stenosis. Hypoplastic right A1 segment. Anterior and middle cerebral arteries are patent bilaterally. Severe stenosis of the superior division of left M1 at the origin. No other significant stenosis in the anterior circulation. Posterior circulation: Severe stenosis  in the distal left vertebral artery. Small basilar artery with severe stenosis in the midportion. Fetal origin of the posterior cerebral artery bilaterally. Moderate stenosis of the proximal and mid right posterior cerebral artery. Venous sinuses: Limited venous contrast due to arterial phase scanning. Anatomic variants: None Review of the MIP images confirms the above findings IMPRESSION: 1. Severe intracranial atherosclerotic disease without acute large vessel occlusion. 2. 25% diameter stenosis proximal right internal carotid artery. No significant left carotid stenosis 3. Non dominant left vertebral artery with diffuse atherosclerotic disease and multiple areas of stenosis. Severe stenosis distal left vertebral artery. Right vertebral artery is disease without significant stenosis. Electronically Signed   By: Marlan Palau M.D.   On: 01/06/2020 14:31   DG Chest 2 View  Result Date: 01/05/2020 CLINICAL DATA:  Acute ischemic stroke. EXAM: CHEST - 2 VIEW COMPARISON:  10/17/2013 FINDINGS: Lower lung volumes from prior exam.  There are patchy opacities at the left greater than right lung base. Mild cardiomegaly. Aortic atherosclerosis. No significant pleural effusion. There may be minimal fluid in the fissures. No pneumothorax. No acute osseous abnormalities are seen. IMPRESSION: 1. Patchy bibasilar opacities, left greater than right, may be atelectasis or pneumonia. 2. Mild cardiomegaly.  Aortic Atherosclerosis (ICD10-I70.0). Electronically Signed   By: Narda Rutherford M.D.   On: 01/05/2020 22:43   CT HEAD WO CONTRAST  Result Date: 01/11/2020 CLINICAL DATA:  Stroke, follow-up EXAM: CT HEAD WITHOUT CONTRAST TECHNIQUE: Contiguous axial images were obtained from the base of the skull through the vertex without intravenous contrast. 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 There is no acute intracranial hemorrhage or mass effect. No new loss of gray-white differentiation. 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 Ventricles are stable in size. Vascular: There is atherosclerotic calcification at the skull base. Skull: Calvarium is unremarkable. Sinuses/Orbits: Layering secretions in the right sphenoid sinus. Other: Mastoid air cells are clear. IMPRESSION: No acute intracranial hemorrhage. Recent small infarcts better seen on MRI. Stable chronic findings detailed above. Electronically Signed   By: Guadlupe Spanish M.D.   On: 01/11/2020 13:21   CT HEAD WO CONTRAST  Result Date: 01/05/2020 CLINICAL DATA:  Increased left leg weakness. EXAM: CT HEAD WITHOUT CONTRAST TECHNIQUE: Contiguous axial images were obtained from the base of the skull through the vertex without intravenous contrast. COMPARISON:  January 26, 2017 FINDINGS: Brain: There is mild cerebral atrophy with widening of the  extra-axial spaces and ventricular dilatation. There are areas of decreased attenuation within the white matter tracts of the supratentorial brain, consistent with microvascular disease changes. Small chronic left basal ganglia lacunar infarcts are noted. Vascular: No hyperdense vessel or unexpected calcification. Skull: Normal. Negative for fracture or focal lesion. Sinuses/Orbits: There is mild sphenoid sinus mucosal thickening. Other: None. IMPRESSION: 1. Generalized cerebral atrophy. 2. Small chronic left basal ganglia lacunar infarcts. 3. No acute intracranial abnormality. Electronically Signed   By: Aram Candela M.D.   On: 01/05/2020 15:42   CT ANGIO NECK W OR WO CONTRAST  Result Date: 01/06/2020 CLINICAL DATA:  Altered mental status. Stroke. Left-sided weakness. EXAM: CT ANGIOGRAPHY HEAD AND NECK TECHNIQUE: Multidetector CT imaging of the head and neck was performed using the standard protocol during bolus administration of intravenous contrast. Multiplanar CT image reconstructions and MIPs were obtained to evaluate the vascular  anatomy. Carotid stenosis measurements (when applicable) are obtained utilizing NASCET criteria, using the distal internal carotid diameter as the denominator. CONTRAST:  80mL OMNIPAQUE IOHEXOL 350 MG/ML SOLN COMPARISON:  CT head 01/05/2020.  MRI head and MRA head 01/06/2020 FINDINGS: CTA NECK FINDINGS Aortic arch: 2 vessel arch with bovine branching pattern. Mild atherosclerotic disease in the aortic arch. Proximal great vessels widely patent. Right carotid system: Right common carotid artery widely patent. Medial deviation of the right carotid bifurcation posterior to the pharynx. Atherosclerotic calcification in the proximal right internal carotid artery with 25% diameter stenosis. Left carotid system: Left carotid artery widely patent without stenosis. Mild atherosclerotic disease at the bifurcation. Medial deviation of the carotid bifurcation in the retropharyngeal  space. Vertebral arteries: Right vertebral artery is patent to the basilar with mild scattered atherosclerotic disease Non dominant left vertebral artery with diffuse atherosclerotic disease. There are multiple areas of stenosis in the proximal and mid and distal left vertebral artery which is patent to the basilar. There is moderate to severe stenosis in the distal left vertebral artery at the skull base and in the left V3 and V4 segments. Skeleton: Cervical spondylosis and kyphosis. No acute skeletal abnormality. Other neck: Negative for mass or adenopathy in the neck. Upper chest: Lung apices clear bilaterally. Review of the MIP images confirms the above findings CTA HEAD FINDINGS Anterior circulation: Atherosclerotic calcification in the cavernous carotid bilaterally. Severe stenosis right supraclinoid internal carotid artery and mild to moderate stenosis right cavernous carotid. Left internal carotid artery is patent without stenosis. Hypoplastic right A1 segment. Anterior and middle cerebral arteries are patent bilaterally. Severe stenosis of the superior division of left M1 at the origin. No other significant stenosis in the anterior circulation. Posterior circulation: Severe stenosis in the distal left vertebral artery. Small basilar artery with severe stenosis in the midportion. Fetal origin of the posterior cerebral artery bilaterally. Moderate stenosis of the proximal and mid right posterior cerebral artery. Venous sinuses: Limited venous contrast due to arterial phase scanning. Anatomic variants: None Review of the MIP images confirms the above findings IMPRESSION: 1. Severe intracranial atherosclerotic disease without acute large vessel occlusion. 2. 25% diameter stenosis proximal right internal carotid artery. No significant left carotid stenosis 3. Non dominant left vertebral artery with diffuse atherosclerotic disease and multiple areas of stenosis. Severe stenosis distal left vertebral artery. Right  vertebral artery is disease without significant stenosis. Electronically Signed   By: Marlan Palau M.D.   On: 01/06/2020 14:31   MR ANGIO HEAD WO CONTRAST  Result Date: 01/06/2020 CLINICAL DATA:  Infarcts on MRI brain EXAM: MRA HEAD WITHOUT CONTRAST TECHNIQUE: Angiographic images of the Circle of Willis were obtained using MRA technique without intravenous contrast. COMPARISON:  None. FINDINGS: Motion artifact is present. Intracranial internal carotid arteries are patent with atherosclerotic irregularity and at least moderate stenosis of the supraclinoid portion. Middle and anterior cerebral arteries are patent. Right A1 ACA is congenitally small or stenotic. Proximal left M2 MCA moderate to marked stenosis. Intracranial vertebral arteries, basilar artery, posterior cerebral arteries are patent. Moderate to marked stenosis of the mid basilar. Bilateral PCA atherosclerotic irregularity with right P2 stenosis. Bilateral posterior communicating arteries are present. There is no aneurysm. IMPRESSION: No proximal intracranial vessel occlusion. Multifocal atherosclerotic irregularity and stenoses. Electronically Signed   By: Guadlupe Spanish M.D.   On: 01/06/2020 11:20   MR BRAIN WO CONTRAST  Result Date: 01/06/2020 CLINICAL DATA:  Left-sided weakness over the last week. EXAM: MRI HEAD WITHOUT CONTRAST TECHNIQUE: Multiplanar,  multiecho pulse sequences of the brain and surrounding structures were obtained without intravenous contrast. COMPARISON:  CT head without contrast 01/05/2020 FINDINGS: Brain: Arcs the diffusion-weighted images demonstrate acute/subacute nonhemorrhagic infarcts involving the right corona radiata. Additional subcortical white matter infarcts are present in the posterior right temporal and right occipital lobes. Minimal cortical involvement is present. Extensive white matter disease is present bilaterally. Remote lacunar infarcts are present within the corona radiata bilaterally. Remote lacunar  infarcts are also present in the left lentiform nucleus. Remote lacunar infarct is present in the left thalamus. Remote white matter infarcts are present adjacent to the atrium of the right lateral ventricle. A remote lacunar infarct is present in the left paramedian pons. White matter changes extend through the brainstem. Remote lacunar infarcts are present in the posteroinferior right cerebellum. The ventricles are proportionate to the degree of atrophy. No significant extraaxial fluid collection is present. The internal auditory canals are within normal limits. Vascular: Flow is present in the major intracranial arteries. Skull and upper cervical spine: Mild degenerative changes are present in the upper cervical spine. Craniocervical junction is normal. Marrow signal is normal. Sinuses/Orbits: A fluid level is present in the right sphenoid sinus. The paranasal sinuses and mastoid air cells are otherwise clear. Bilateral lens replacements are noted. Globes and orbits are otherwise unremarkable. IMPRESSION: 1. Acute/subacute nonhemorrhagic infarcts involving the right corona radiata and posterior right temporal and occipital lobes. Findings correspond to the subacute time frame of left-sided weakness. 2. Remote lacunar infarcts of the corona radiata bilaterally, left thalamus, and left paramedian pons. 3. Extensive white matter disease likely reflects the sequela of chronic microvascular ischemia. 4. Right sphenoid sinus disease. Electronically Signed   By: Marin Roberts M.D.   On: 01/06/2020 05:33   ECHOCARDIOGRAM COMPLETE  Result Date: 01/06/2020    ECHOCARDIOGRAM REPORT   Patient Name:   Jill Rose Date of Exam: 01/06/2020 Medical Rec #:  [id number]      Height:       61.0 in Accession #:    [id number]     Weight:       174.4 lb Date of Birth:  [birth year]       BSA:          1.782 m Patient Age:    78 years       BP:           179/70 mmHg Patient Gender: F              HR:           88 bpm. Exam  Location:  Inpatient Procedure: 2D Echo Indications:     Stroke 434.91 / I163.9  History:         Patient has prior history of Echocardiogram examinations, most                  recent 01/28/2017. TIA; Risk Factors:Hypertension and Diabetes.                  Thyroid disease.  Sonographer:     Leta Jungling RDCS Referring Phys:  [id number] I[address] [place] Diagnosing Phys: Armanda Magic MD  Sonographer Comments: No IV access. IMPRESSIONS  1. There is a mid cavitary resting gradient of due to hyperdynamic LVF.     Marland Kitchen Left ventricular ejection fraction, by estimation, is 65 to 70%. The left ventricle has normal function. The left ventricle has no regional wall motion abnormalities. Left ventricular diastolic parameters are consistent with  Grade I diastolic dysfunction (impaired relaxation). Elevated left ventricular end-diastolic pressure.  2. Right ventricular systolic function is normal. The right ventricular size is normal. Tricuspid regurgitation signal is inadequate for assessing PA pressure.  3. The mitral valve is normal in structure. No evidence of mitral valve regurgitation. Mild mitral stenosis. The mean mitral valve gradient is 6.5 mmHg.  4. The aortic valve is tricuspid. Aortic valve regurgitation is not visualized. Mild to moderate aortic valve sclerosis/calcification is present, without any evidence of aortic stenosis. Aortic valve mean gradient measures 9.0 mmHg. Aortic valve Vmax measures 1.97 m/s.  5. Aortic dilatation noted. 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  6. The inferior vena cava is normal in size with greater than 50% respiratory variability, suggesting right atrial pressure of 3 mmHg. FINDINGS  Left Ventricle: There is a mid cavitary resting gradient of due to hyperdynamic LVF. Left ventricular ejection fraction, by estimation, is 65 to 70%. The left ventricle has normal function. The left ventricle has no regional wall motion abnormalities. The left  ventricular internal cavity size was normal in size. There is no left ventricular hypertrophy. Left ventricular diastolic parameters are consistent with Grade I diastolic dysfunction (impaired relaxation). Elevated left ventricular end-diastolic pressure. Right Ventricle: The right ventricular size is normal. No increase in right ventricular wall thickness. Right ventricular systolic function is normal. Tricuspid regurgitation signal is inadequate for assessing PA pressure. Left Atrium: Left atrial size was normal in size. Right Atrium: Right atrial size was normal in size. Pericardium: There is no evidence of pericardial effusion. Mitral Valve: The mitral valve is normal in structure. Normal mobility of the mitral valve leaflets. Severe mitral annular calcification. No evidence of mitral valve regurgitation. Mild mitral valve stenosis. MV peak gradient, 18.6 mmHg. The mean mitral valve gradient is 6.5 mmHg. Tricuspid Valve: The tricuspid valve is normal in structure. Tricuspid valve regurgitation is not demonstrated. No evidence of tricuspid stenosis. Aortic Valve: The aortic valve is tricuspid. Aortic valve regurgitation is not visualized. Mild to moderate aortic valve sclerosis/calcification is present, without any evidence of aortic stenosis. Aortic valve mean gradient measures 9.0 mmHg. Aortic valve peak gradient measures 15.5 mmHg. Aortic valve area, by VTI measures 1.06 cm. Pulmonic Valve: The pulmonic valve was normal in structure. Pulmonic valve regurgitation is trivial. No evidence of pulmonic stenosis. Aorta: Aortic dilatation noted. 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 Venous: The inferior vena cava was not well visualized. The inferior vena cava is normal in size with greater than 50% respiratory variability, suggesting right atrial pressure of 3 mmHg. IAS/Shunts: No atrial level shunt detected by color flow Doppler.  LEFT VENTRICLE PLAX 2D LVIDd:         3.80 cm  Diastology  LVIDs:         2.60 cm  LV e' lateral:   4.46 cm/s LV PW:         1.00 cm  LV E/e' lateral: 26.2 LV IVS:        1.10 cm  LV e' medial:    4.13 cm/s LVOT diam:     1.60 cm  LV E/e' medial:  28.3 LV SV:         38 LV SV Index:   21 LVOT Area:     2.01 cm  RIGHT VENTRICLE TAPSE (M-mode): 1.6 cm LEFT ATRIUM             Index LA diam:  3.10 cm 1.74 cm/m LA Vol (A2C):   30.1 ml 16.89 ml/m LA Vol (A4C):   29.3 ml 16.44 ml/m LA Biplane Vol: 34.9 ml 19.59 ml/m  AORTIC VALVE AV Area (Vmax):    1.30 cm AV Area (Vmean):   1.34 cm AV Area (VTI):     1.06 cm AV Vmax:           197.00 cm/s AV Vmean:          135.000 cm/s AV VTI:            0.355 m AV Peak Grad:      15.5 mmHg AV Mean Grad:      9.0 mmHg LVOT Vmax:         127.00 cm/s LVOT Vmean:        89.900 cm/s LVOT VTI:          0.187 m LVOT/AV VTI ratio: 0.53  AORTA Ao Root diam: 3.40 cm MITRAL VALVE MV Area (PHT): 2.48 cm     SHUNTS MV Peak grad:  18.6 mmHg    Systemic VTI:  0.19 m MV Mean grad:  6.5 mmHg     Systemic Diam: 1.60 cm MV Vmax:       2.16 m/s MV Vmean:      117.5 cm/s MV Decel Time: 306 msec MV E velocity: 117.00 cm/s MV A velocity: 209.00 cm/s MV E/A ratio:  0.56 Armanda Magic MD Electronically signed by Armanda Magic MD Signature Date/Time: 01/06/2020/8:59:43 AM    Final (Updated)     Microbiology: Recent Results (from the past 240 hour(s))  SARS Coronavirus 2 by RT PCR (hospital order, performed in [facility] hospital lab) Nasopharyngeal Nasopharyngeal Swab     Status: None   Collection Time: 01/05/20  9:26 PM   Specimen: Nasopharyngeal Swab  Result Value Ref Range Status   SARS Coronavirus 2 NEGATIVE NEGATIVE Final    Comment: (NOTE) SARS-CoV-2 target nucleic acids are NOT DETECTED.  The SARS-CoV-2 RNA is generally detectable in upper and lower respiratory specimens during the acute phase of infection. The lowest concentration of SARS-CoV-2 viral copies this assay can detect is 250 copies / mL. A negative result does not preclude  SARS-CoV-2 infection and should not be used as the sole basis for treatment or other patient management decisions.  A negative result may occur with improper specimen collection / handling, submission of specimen other than nasopharyngeal swab, presence of viral mutation(s) within the areas targeted by this assay, and inadequate number of viral copies (<250 copies / mL). A negative result must be combined with clinical observations, patient history, and epidemiological information.  Fact Sheet for Patients:   BoilerBrush.com.cy  Fact Sheet for Healthcare Providers: https://pope.com/  This test is not yet approved or  cleared by the [place] FDA and has been authorized for detection and/or diagnosis of SARS-CoV-2 by FDA under an Emergency Use Authorization (EUA).  This EUA will remain in effect (meaning this test can be used) for the duration of the COVID-19 declaration under Section 564(b)(1) of the Act, 21 U.S.C. section 360bbb-3(b)(1), unless the authorization is terminated or revoked sooner.  Performed at [facility], [address], [place], [place] [address]      Labs: Basic Metabolic Panel: No results for input(s): NA, K, CL, CO2, GLUCOSE, BUN, CREATININE, CALCIUM, MG, PHOS in the last 168 hours. Liver Function Tests: No results for input(s): AST, ALT, ALKPHOS, BILITOT, PROT, ALBUMIN in the last 168 hours. No results for  input(s): LIPASE, AMYLASE in the last 168 hours. No results for input(s): AMMONIA in the last 168 hours. CBC: No results for input(s): WBC, NEUTROABS, HGB, HCT, MCV, PLT in the last 168 hours. Cardiac Enzymes: No results for input(s): CKTOTAL, CKMB, CKMBINDEX, TROPONINI in the last 168 hours. BNP: BNP (last 3 results) No results for input(s): BNP in the last 8760 hours.  ProBNP (last 3 results) No results for input(s): PROBNP in the last 8760 hours.  CBG: Recent Labs  Lab 01/12/20 1106  01/12/20 1609 01/12/20 2115 01/13/20 0602 01/13/20 1108  GLUCAP 190* 166* 129* 148* 170*       Signed:  Darlin Drop, MD [facility] 01/13/2020, 11:21 AM

## 2020-01-13 NOTE — H&P (Addendum)
Physical Medicine and Rehabilitation Admission H&P     HPI: Jill Rose is a 79 year old right-handed female with history of diet-controlled diabetes mellitus, hypertension, hypothyroidism, TIA maintained on aspirin. History taken from chart review and patient. Patient lives with spouse.  Two-level home with 3-4 steps to entry reportedly independent prior to admission.  She has a supportive family who plans to hire assist as needed.  She presented on 01/05/2020 with left hemiparesis. CT/MRI showed acute subacute nonhemorrhagic infarction involving the right corona radiata and right posterior temporal and occipital lobes. Remote lacunar infarct of the corona radiata bilaterally, left thalamus and left paramedian pons.  Patient did not receive TPA.  MRA with no proximal intracranial vessel occlusion.  CT angiogram of head and neck showed severe intracranial atherosclerotic disease without acute large vessel occlusion.  25% diameter stenosis proximal right ICA.  No significant left carotid stenosis.  Echocardiogram with EF of 70% without any wall motion abnormalities.  Admission chemistry sodium 132, potassium 5.3, glucose 135.  Neurology services consulted currently maintained on aspirin 325 mg and Plavix for CVA prophylaxis x3 months then Plavix alone.  Subcutaneous Lovenox for DVT prophylaxis.  Tolerating a regular diet.  Therapy evaluations completed and patient was admitted for a comprehensive rehab program. Please see preadmission assessment from earlier today as well.  Review of Systems  Constitutional: Positive for malaise/fatigue. Negative for chills and fever.  HENT: Negative for hearing loss.   Eyes: Negative for blurred vision and double vision.  Respiratory: Negative for cough and shortness of breath.   Cardiovascular: Negative for chest pain, palpitations and leg swelling.  Gastrointestinal: Positive for constipation. Negative for heartburn, nausea and vomiting.  Genitourinary:  Negative for flank pain and hematuria.  Musculoskeletal: Positive for myalgias.  Skin: Negative for rash.  Neurological: Positive for speech change, focal weakness and weakness. Negative for sensory change.  All other systems reviewed and are negative.  Past Medical History:  Diagnosis Date  . Diabetes mellitus without complication (HCC)   . Essential hypertension   . Hypothyroidism   . TIA (transient ischemic attack) 2018   No past surgical history. Family History  Problem Relation Age of Onset  . Heart disease Mother    Social History:  reports that she has never smoked. She has never used smokeless tobacco. She reports that she does not drink alcohol and does not use drugs. Allergies:  Allergies  Allergen Reactions  . Ammonia Shortness Of Breath, Itching and Swelling  . Peroxide [Hydrogen Peroxide] Shortness Of Breath, Itching and Swelling   Medications Prior to Admission  Medication Sig Dispense Refill  . aspirin EC 81 MG tablet Take 162 mg by mouth daily before supper. Swallow whole.    . b complex vitamins tablet Take 1 tablet by mouth daily with lunch.    [name] Grape-Goldenseal (BERBERINE COMPLEX PO) Take 1 tablet by mouth daily with lunch.    . Cholecalciferol (VITAMIN D3 PO) Take 1 tablet by mouth daily with lunch.     . Cyanocobalamin (VITAMIN B-12 PO) Take 1 tablet by mouth daily with lunch.    . levothyroxine (SYNTHROID) 88 MCG tablet Take 88 mcg by mouth daily before breakfast.    . losartan (COZAAR) 50 MG tablet Take 50 mg by mouth at bedtime.    . Menaquinone-7 (VITAMIN K2 PO) Take 1 tablet by mouth daily with lunch.      Drug Regimen Review Drug regimen was reviewed and remains appropriate with no significant issues identified  Home: Home Living Family/patient expects to be discharged to:: Private residence Living Arrangements: Spouse/significant other Available Help at Discharge: Family Type of Home: House Home Access: Stairs to enter Water quality scientist of Steps: 3-4 Entrance Stairs-Rails: Right, Left Home Layout: Two level Alternate Level Stairs-Number of Steps: flight Alternate Level Stairs-Rails: Left Bathroom Shower/Tub: Engineer, manufacturing systems: Standard Home Equipment: None  Lives With: Spouse   Functional History: Prior Function Level of Independence: Independent Comments: daughter reports recent use of AD with mobility  Functional Status:  Mobility: Bed Mobility Overal bed mobility: Needs Assistance Bed Mobility: Supine to Sit, Sit to Supine Supine to sit: Mod assist Sit to supine: Mod assist General bed mobility comments: pt OOB in recliner chair upon arrival Transfers Overall transfer level: Needs assistance Equipment used: Rolling walker (2 wheeled) Transfers: Sit to/from Stand Sit to Stand: Min assist, Mod assist Stand pivot transfers: Min assist General transfer comment: progressing from mod A to min A with 5x sit<>stand, cueing needed for increased anterior weight shift  Ambulation/Gait Ambulation/Gait assistance: Min assist, +2 safety/equipment (chair follow) Gait Distance (Feet): 55 Feet Assistive device: Rolling walker (2 wheeled) Gait Pattern/deviations: Step-to pattern, Decreased step length - right, Decreased step length - left, Decreased stride length, Shuffle General Gait Details: very poor foot clearance bilaterally, cueing for improved step length bilaterally; however, without constant cueing, pt reverting back to shuffling pattern.  Gait velocity: decreased    ADL: ADL Overall ADL's : Needs assistance/impaired Eating/Feeding: Set up, Sitting Grooming: Set up, Sitting, Oral care, Brushing hair Grooming Details (indicate cue type and reason): Setup for oral care seated in recliner Upper Body Bathing: Minimal assistance, Sitting Upper Body Bathing Details (indicate cue type and reason): Min A to bathe back  Lower Body Bathing: Moderate assistance Lower Body Bathing Details  (indicate cue type and reason): Mod A to bathe B lower legs safely and bottom in standing Upper Body Dressing : Set up, Sitting Upper Body Dressing Details (indicate cue type and reason): Setup to don hospital gown seated Lower Body Dressing: Moderate assistance, Sit to/from stand Lower Body Dressing Details (indicate cue type and reason): Mod A to don socks, don brief Toilet Transfer: Minimal assistance, Stand-pivot, BSC, Cueing for safety, Cueing for sequencing Toilet Transfer Details (indicate cue type and reason): Min A for RW SPT recliner <> BSC. Required cues to pick up feet, manuever RW and maintain steadiness in standing Toileting- Clothing Manipulation and Hygiene: Moderate assistance, Sit to/from stand Toileting - Clothing Manipulation Details (indicate cue type and reason): Mod A for posterior hygiene in standing and pulling up briefs. Pt able to doff briefs with Min A General ADL Comments: Pt with improved steadiness during ADL transfers and improving problem solving and directing for sequencing, but continues to require cues for safe completion  Cognition: Cognition Overall Cognitive Status: Impaired/Different from baseline Arousal/Alertness: Awake/alert Orientation Level: Oriented X4 Attention: Focused Focused Attention: Impaired Focused Attention Impairment: Verbal complex, Functional complex Memory: Impaired Memory Impairment: Storage deficit, Decreased short term memory Decreased Short Term Memory: Functional complex Awareness: Appears intact Problem Solving: Impaired Problem Solving Impairment: Verbal complex, Functional complex Executive Function: Reasoning, Sequencing, Organizing Reasoning: Appears intact Sequencing: Impaired Sequencing Impairment: Verbal complex, Functional complex Organizing: Impaired Organizing Impairment: Verbal complex, Functional complex Safety/Judgment: Appears intact Cognition Arousal/Alertness: Awake/alert Behavior During Therapy: WFL  for tasks assessed/performed, Impulsive Overall Cognitive Status: Impaired/Different from baseline Area of Impairment: Memory, Safety/judgement, Awareness Memory: Decreased short-term memory Safety/Judgement: Decreased awareness of deficits, Decreased awareness of safety Awareness: Intellectual  Problem Solving: Requires tactile cues, Requires verbal cues General Comments: requires multimodal cues for hand placement, RW manuevering, and taking bigger steps  Physical Exam: Blood pressure (!) 151/59, pulse 84, temperature 98.3 F (36.8 C), temperature source Oral, resp. rate 17, height 5\' 1"  (1.549 m), weight 73.3 kg, SpO2 97 %. Physical Exam Vitals reviewed.  Constitutional:      General: She is not in acute distress.    Appearance: She is obese.  HENT:     Head: Normocephalic and atraumatic.     Nose: Nose normal.  Eyes:     General:        Right eye: No discharge.        Left eye: No discharge.     Extraocular Movements: Extraocular movements intact.  Pulmonary:     Effort: Pulmonary effort is normal. No respiratory distress.     Breath sounds: No stridor.  Abdominal:     General: There is no distension.     Palpations: Abdomen is soft.  Musculoskeletal:     Cervical back: Neck supple.     Comments: No edema or tenderness in extremities  Skin:    General: Skin is warm and dry.  Neurological:     Mental Status: She is alert.     Comments: Alert She makes eye contact with examiner.   Follows simple commands.   Provides her name age date of birth with some delay in processing.   Appears to have a fair awareness of deficits. Motor: RUE/RLE: 5/5 proximal to distal LUE: 4+/5 proximal to distal LLE: 4-4+/5 proximal to distal LUE: Mild ataxia  Psychiatric:        Mood and Affect: Mood normal.        Behavior: Behavior normal.     Results for orders placed or performed during the hospital encounter of 01/05/20 (from the past 48 hour(s))  Glucose, capillary     Status:  Abnormal   Collection Time: 01/12/20  9:15 PM  Result Value Ref Range   Glucose-Capillary 129 (H) 70 - 99 mg/dL    Comment: Glucose reference range applies only to samples taken after fasting for at least 8 hours.  Glucose, capillary     Status: Abnormal   Collection Time: 01/13/20  6:02 AM  Result Value Ref Range   Glucose-Capillary 148 (H) 70 - 99 mg/dL    Comment: Glucose reference range applies only to samples taken after fasting for at least 8 hours.  Glucose, capillary     Status: Abnormal   Collection Time: 01/13/20 11:08 AM  Result Value Ref Range   Glucose-Capillary 170 (H) 70 - 99 mg/dL    Comment: Glucose reference range applies only to samples taken after fasting for at least 8 hours.  Glucose, capillary     Status: Abnormal   Collection Time: 01/13/20  4:35 PM  Result Value Ref Range   Glucose-Capillary 151 (H) 70 - 99 mg/dL    Comment: Glucose reference range applies only to samples taken after fasting for at least 8 hours.  Glucose, capillary     Status: Abnormal   Collection Time: 01/13/20  9:19 PM  Result Value Ref Range   Glucose-Capillary 211 (H) 70 - 99 mg/dL    Comment: Glucose reference range applies only to samples taken after fasting for at least 8 hours.  Glucose, capillary     Status: Abnormal   Collection Time: 01/14/20  6:14 AM  Result Value Ref Range   Glucose-Capillary 135 (H) 70 -  99 mg/dL    Comment: Glucose reference range applies only to samples taken after fasting for at least 8 hours.  Glucose, capillary     Status: Abnormal   Collection Time: 01/14/20 11:13 AM  Result Value Ref Range   Glucose-Capillary 134 (H) 70 - 99 mg/dL    Comment: Glucose reference range applies only to samples taken after fasting for at least 8 hours.  Glucose, capillary     Status: Abnormal   Collection Time: 01/14/20  4:06 PM  Result Value Ref Range   Glucose-Capillary 147 (H) 70 - 99 mg/dL    Comment: Glucose reference range applies only to samples taken after  fasting for at least 8 hours.   No results found.     Medical Problem List and Plan: 1.  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  Antithrombotics: -DVT/anticoagulation: Lovenox  -antiplatelet therapy: Aspirin 325 mg and Plavix 75 mg daily x3 months then Plavix alone 3. Pain Management: Tylenol as needed 4. Mood: Provide emotional support  -antipsychotic agents: N/A 5. Neuropsych: This patient is capable of making decisions on her own behalf. 6. Skin/Wound Care: Routine skin checks 7. Fluids/Electrolytes/Nutrition: Routine in and outs. CMP ordered 8.  Hypertension.  Cozaar 50 mg nightly, Norvasc 10 mg daily.    Monitor with increased mobility 9.  Diabetes mellitus with peripheral neuropathy.  Hemoglobin A1c 7.6.  SSI.  Monitor with increased mobility. 10.  Hypothyroidism.  Synthroid 11.  Hyperlipidemia.  Lipitor 12. Obesity: Encourage weight loss  Charlton Amor, PA-C 01/14/2020  I have personally performed a face to face diagnostic evaluation, including, but not limited to relevant history and physical exam findings, of this patient and developed relevant assessment and plan.  Additionally, I have reviewed and concur with the physician assistant's documentation above.  Maryla Morrow, MD, ABPMR

## 2020-01-13 NOTE — Progress Notes (Signed)
Physical Therapy Treatment Patient Details Name: Jill Rose MRN: [id number] DOB: 1941/05/06 Today's Date: 01/13/2020    History of Present Illness Pt is a 79 y/o female admitted secondary to worsening L sided weakness. 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 PMH includes HTN.     PT Comments    Continuing work on functional mobility and activity tolerance;  Able to progress gait distance, and roughly double yesterday's distance; Still needing work on balance and full weight shifting onto stance leg to allow for smooth swing LE advancement; She seemed pleased with her progress; Continue to recommend comprehensive inpatient rehab (CIR) for post-acute therapy needs.    Follow Up Recommendations  CIR     Equipment Recommendations  Rolling walker with 5" wheels;3in1 (PT)    Recommendations for Other Services       Precautions / Restrictions Precautions Precautions: Fall    Mobility  Bed Mobility                  Transfers Overall transfer level: Needs assistance Equipment used: Rolling walker (2 wheeled) Transfers: Sit to/from Stand Sit to Stand: Min assist;Mod assist         General transfer comment: Min to Mod A for sit to stand transfers to power up, and initiate with anterior lean  Ambulation/Gait Ambulation/Gait assistance: Mod assist (second person for chair push) Gait Distance (Feet): 50 Feet Assistive device: Rolling walker (2 wheeled) Gait Pattern/deviations: Step-to pattern;Decreased step length - right;Decreased step length - left;Decreased stance time - right;Decreased stance time - left;Decreased weight shift to right;Decreased weight shift to left     General Gait Details: Noting decr weight shift fully into stance both R and L, with wide BOS and very short steps; more difficulty with weight shift onto L, leading to shorter step length R; small steps, resembling festination; notable improving weight shift into  stance and step lenght with multimodal cues for slow, long steps   Stairs             Wheelchair Mobility    Modified Rankin (Stroke Patients Only) Modified Rankin (Stroke Patients Only) Pre-Morbid Rankin Score: No symptoms Modified Rankin: Moderately severe disability     Balance Overall balance assessment: Needs assistance   Sitting balance-Leahy Scale: Good       Standing balance-Leahy Scale: Poor                              Cognition Arousal/Alertness: Awake/alert Behavior During Therapy: WFL for tasks assessed/performed;Impulsive Overall Cognitive Status: Impaired/Different from baseline Area of Impairment: Safety/judgement;Awareness                               General Comments: requires multimodal cues for hand placement, RW manuevering, and taking bigger steps      Exercises      General Comments General comments (skin integrity, edema, etc.): Pt's sister present during session      Pertinent Vitals/Pain Pain Assessment: No/denies pain Pain Intervention(s): Monitored during session    Home Living                      Prior Function            PT Goals (current goals can now be found in the care plan section) Acute Rehab PT Goals Patient Stated Goal: go to CIR PT  Goal Formulation: With patient Time For Goal Achievement: 01/20/20 Potential to Achieve Goals: Good Progress towards PT goals: Progressing toward goals    Frequency    Min 4X/week      PT Plan Current plan remains appropriate;Frequency needs to be updated    Co-evaluation              AM-PAC PT "6 Clicks" Mobility   Outcome Measure  Help needed turning from your back to your side while in a flat bed without using bedrails?: A Little Help needed moving from lying on your back to sitting on the side of a flat bed without using bedrails?: A Little Help needed moving to and from a bed to a chair (including a wheelchair)?: A  Little Help needed standing up from a chair using your arms (e.g., wheelchair or bedside chair)?: A Lot Help needed to walk in hospital room?: A Lot Help needed climbing 3-5 steps with a railing? : Total 6 Click Score: 14    End of Session Equipment Utilized During Treatment: Gait belt Activity Tolerance: Patient tolerated treatment well Patient left: in chair;with call bell/phone within reach;with chair alarm set;with family/visitor present Nurse Communication: Mobility status PT Visit Diagnosis: Unsteadiness on feet (R26.81);Difficulty in walking, not elsewhere classified (R26.2);Hemiplegia and hemiparesis Hemiplegia - Right/Left: Left Hemiplegia - caused by: Cerebral infarction     Time: 1540-0867 PT Time Calculation (min) (ACUTE ONLY): 22 min  Charges:  $Gait Training: 8-22 mins                     Van Clines, PT  Acute Rehabilitation Services Pager [phone] Office [phone]    Levi Aland 01/13/2020, 2:18 PM

## 2020-01-14 ENCOUNTER — Other Ambulatory Visit: Payer: Self-pay

## 2020-01-14 ENCOUNTER — Telehealth: Payer: Self-pay | Admitting: Family Medicine

## 2020-01-14 ENCOUNTER — Inpatient Hospital Stay (HOSPITAL_COMMUNITY)
Admission: RE | Admit: 2020-01-14 | Discharge: 2020-01-29 | DRG: 057 | Disposition: A | Discharge status: Home / Self Care | Payer: Medicare HMO | Source: Intra-hospital | Attending: Physical Medicine & Rehabilitation | Admitting: Physical Medicine & Rehabilitation

## 2020-01-14 ENCOUNTER — Encounter: Payer: Self-pay | Admitting: Family Medicine

## 2020-01-14 ENCOUNTER — Encounter (HOSPITAL_COMMUNITY): Payer: Self-pay | Admitting: Physical Medicine & Rehabilitation

## 2020-01-14 DIAGNOSIS — R7309 Other abnormal glucose: Secondary | ICD-10-CM

## 2020-01-14 DIAGNOSIS — Z7982 Long term (current) use of aspirin: Secondary | ICD-10-CM | POA: Diagnosis not present

## 2020-01-14 DIAGNOSIS — R944 Abnormal results of kidney function studies: Secondary | ICD-10-CM | POA: Diagnosis present

## 2020-01-14 DIAGNOSIS — I69393 Ataxia following cerebral infarction: Secondary | ICD-10-CM

## 2020-01-14 DIAGNOSIS — Z6831 Body mass index (BMI) 31.0-31.9, adult: Secondary | ICD-10-CM | POA: Diagnosis not present

## 2020-01-14 DIAGNOSIS — I129 Hypertensive chronic kidney disease with stage 1 through stage 4 chronic kidney disease, or unspecified chronic kidney disease: Secondary | ICD-10-CM | POA: Diagnosis present

## 2020-01-14 DIAGNOSIS — R799 Abnormal finding of blood chemistry, unspecified: Secondary | ICD-10-CM

## 2020-01-14 DIAGNOSIS — E1142 Type 2 diabetes mellitus with diabetic polyneuropathy: Secondary | ICD-10-CM | POA: Diagnosis present

## 2020-01-14 DIAGNOSIS — R0989 Other specified symptoms and signs involving the circulatory and respiratory systems: Secondary | ICD-10-CM | POA: Diagnosis present

## 2020-01-14 DIAGNOSIS — E785 Hyperlipidemia, unspecified: Secondary | ICD-10-CM | POA: Diagnosis present

## 2020-01-14 DIAGNOSIS — Z79899 Other long term (current) drug therapy: Secondary | ICD-10-CM

## 2020-01-14 DIAGNOSIS — N182 Chronic kidney disease, stage 2 (mild): Secondary | ICD-10-CM | POA: Diagnosis present

## 2020-01-14 DIAGNOSIS — Z8249 Family history of ischemic heart disease and other diseases of the circulatory system: Secondary | ICD-10-CM

## 2020-01-14 DIAGNOSIS — E039 Hypothyroidism, unspecified: Secondary | ICD-10-CM | POA: Diagnosis present

## 2020-01-14 DIAGNOSIS — Z888 Allergy status to other drugs, medicaments and biological substances status: Secondary | ICD-10-CM

## 2020-01-14 DIAGNOSIS — E1165 Type 2 diabetes mellitus with hyperglycemia: Secondary | ICD-10-CM | POA: Diagnosis present

## 2020-01-14 DIAGNOSIS — I69354 Hemiplegia and hemiparesis following cerebral infarction affecting left non-dominant side: Principal | ICD-10-CM

## 2020-01-14 DIAGNOSIS — G8194 Hemiplegia, unspecified affecting left nondominant side: Secondary | ICD-10-CM

## 2020-01-14 DIAGNOSIS — I10 Essential (primary) hypertension: Secondary | ICD-10-CM

## 2020-01-14 DIAGNOSIS — I63511 Cerebral infarction due to unspecified occlusion or stenosis of right middle cerebral artery: Secondary | ICD-10-CM | POA: Diagnosis present

## 2020-01-14 DIAGNOSIS — I672 Cerebral atherosclerosis: Secondary | ICD-10-CM | POA: Diagnosis present

## 2020-01-14 DIAGNOSIS — E1122 Type 2 diabetes mellitus with diabetic chronic kidney disease: Secondary | ICD-10-CM | POA: Diagnosis present

## 2020-01-14 DIAGNOSIS — E871 Hypo-osmolality and hyponatremia: Secondary | ICD-10-CM | POA: Diagnosis present

## 2020-01-14 DIAGNOSIS — E6609 Other obesity due to excess calories: Secondary | ICD-10-CM

## 2020-01-14 DIAGNOSIS — E1151 Type 2 diabetes mellitus with diabetic peripheral angiopathy without gangrene: Secondary | ICD-10-CM | POA: Diagnosis present

## 2020-01-14 DIAGNOSIS — Z6830 Body mass index (BMI) 30.0-30.9, adult: Secondary | ICD-10-CM

## 2020-01-14 LAB — CBC
HCT: 38.6 % (ref 36.0–46.0)
Hemoglobin: 12.3 g/dL (ref 12.0–15.0)
MCH: 27.4 pg (ref 26.0–34.0)
MCHC: 31.9 g/dL (ref 30.0–36.0)
MCV: 86 fL (ref 80.0–100.0)
Platelets: 251 10*3/uL (ref 150–400)
RBC: 4.49 MIL/uL (ref 3.87–5.11)
RDW: 15.3 % (ref 11.5–15.5)
WBC: 10.7 10*3/uL — ABNORMAL HIGH (ref 4.0–10.5)
nRBC: 0 % (ref 0.0–0.2)

## 2020-01-14 LAB — CREATININE, SERUM
Creatinine, Ser: 1 mg/dL (ref 0.44–1.00)
GFR calc Af Amer: >60 mL/min (ref >60)
GFR calc non Af Amer: 55 mL/min — ABNORMAL LOW (ref >60)

## 2020-01-14 LAB — GLUCOSE, CAPILLARY
Glucose-Capillary: 135 mg/dL — ABNORMAL HIGH (ref 70–99)
Glucose-Capillary: 134 mg/dL — ABNORMAL HIGH (ref 70–99)
Glucose-Capillary: 147 mg/dL — ABNORMAL HIGH (ref 70–99)
Glucose-Capillary: 184 mg/dL — ABNORMAL HIGH (ref 70–99)

## 2020-01-14 MED ORDER — INSULIN ASPART 100 UNIT/ML ~~LOC~~ SOLN
0.0000 [IU] | Freq: Three times a day (TID) | SUBCUTANEOUS | Status: DC
  Administered 2020-01-15: 5 [IU] via SUBCUTANEOUS
  Administered 2020-01-15: 1 [IU] via SUBCUTANEOUS
  Administered 2020-01-16: 3 [IU] via SUBCUTANEOUS
  Administered 2020-01-16: 1 [IU] via SUBCUTANEOUS
  Administered 2020-01-16: 2 [IU] via SUBCUTANEOUS
  Administered 2020-01-17 (×2): 1 [IU] via SUBCUTANEOUS
  Administered 2020-01-17: 2 [IU] via SUBCUTANEOUS
  Administered 2020-01-18: 1 [IU] via SUBCUTANEOUS
  Administered 2020-01-18: 2 [IU] via SUBCUTANEOUS
  Administered 2020-01-18: 1 [IU] via SUBCUTANEOUS
  Administered 2020-01-19: 2 [IU] via SUBCUTANEOUS
  Administered 2020-01-19: 1 [IU] via SUBCUTANEOUS
  Administered 2020-01-20: 2 [IU] via SUBCUTANEOUS
  Administered 2020-01-20 (×2): 1 [IU] via SUBCUTANEOUS
  Administered 2020-01-21: 2 [IU] via SUBCUTANEOUS
  Administered 2020-01-21 (×2): 1 [IU] via SUBCUTANEOUS
  Administered 2020-01-22: 2 [IU] via SUBCUTANEOUS
  Administered 2020-01-22 – 2020-01-25 (×7): 1 [IU] via SUBCUTANEOUS
  Administered 2020-01-26: 2 [IU] via SUBCUTANEOUS
  Administered 2020-01-27 – 2020-01-28 (×2): 1 [IU] via SUBCUTANEOUS

## 2020-01-14 MED ORDER — FLUTICASONE PROPIONATE 50 MCG/ACT NA SUSP
1.0000 | Freq: Every day | NASAL | Status: DC
  Filled 2020-01-14: qty 16

## 2020-01-14 MED ORDER — ASPIRIN 325 MG PO TABS
325.0000 mg | ORAL_TABLET | Freq: Every day | ORAL | Status: DC
  Administered 2020-01-15 – 2020-01-29 (×15): 325 mg via ORAL
  Filled 2020-01-14 (×15): qty 1

## 2020-01-14 MED ORDER — ACETAMINOPHEN 650 MG RE SUPP: 650.0000 mg | RECTAL | Status: DC | PRN

## 2020-01-14 MED ORDER — ENOXAPARIN SODIUM 40 MG/0.4ML ~~LOC~~ SOLN
40.0000 mg | SUBCUTANEOUS | Status: DC
  Administered 2020-01-14: 40 mg via SUBCUTANEOUS

## 2020-01-14 MED ORDER — LEVOTHYROXINE SODIUM 88 MCG PO TABS
88.0000 ug | ORAL_TABLET | Freq: Every day | ORAL | Status: DC
  Administered 2020-01-15 – 2020-01-29 (×15): 88 ug via ORAL
  Filled 2020-01-14 (×16): qty 1

## 2020-01-14 MED ORDER — ACETAMINOPHEN 160 MG/5ML PO SOLN: 650.0000 mg | ORAL | Status: DC | PRN

## 2020-01-14 MED ORDER — CLOPIDOGREL BISULFATE 75 MG PO TABS
75.0000 mg | ORAL_TABLET | Freq: Every day | ORAL | Status: DC
  Administered 2020-01-15 – 2020-01-29 (×15): 75 mg via ORAL
  Filled 2020-01-14 (×15): qty 1

## 2020-01-14 MED ORDER — ENOXAPARIN SODIUM 40 MG/0.4ML ~~LOC~~ SOLN
40.0000 mg | SUBCUTANEOUS | Status: DC
  Administered 2020-01-15 – 2020-01-28 (×14): 40 mg via SUBCUTANEOUS
  Filled 2020-01-14 (×15): qty 0.4

## 2020-01-14 MED ORDER — LOSARTAN POTASSIUM 50 MG PO TABS
50.0000 mg | ORAL_TABLET | Freq: Every day | ORAL | Status: DC
  Administered 2020-01-14 – 2020-01-17 (×4): 50 mg via ORAL
  Filled 2020-01-14 (×4): qty 1

## 2020-01-14 MED ORDER — AMLODIPINE BESYLATE 10 MG PO TABS
10.0000 mg | ORAL_TABLET | Freq: Every day | ORAL | Status: DC
  Administered 2020-01-15 – 2020-01-29 (×15): 10 mg via ORAL
  Filled 2020-01-14 (×15): qty 1

## 2020-01-14 MED ORDER — ATORVASTATIN CALCIUM 80 MG PO TABS
80.0000 mg | ORAL_TABLET | Freq: Every day | ORAL | Status: DC
  Administered 2020-01-15 – 2020-01-29 (×15): 80 mg via ORAL
  Filled 2020-01-14 (×2): qty 1
  Filled 2020-01-14: qty 2
  Filled 2020-01-14 (×12): qty 1

## 2020-01-14 MED ORDER — ACETAMINOPHEN 325 MG PO TABS: 650.0000 mg | ORAL_TABLET | ORAL | Status: DC | PRN

## 2020-01-14 MED ORDER — SORBITOL 70 % SOLN: 30.0000 mL | Freq: Every day | Status: DC | PRN

## 2020-01-14 NOTE — Progress Notes (Signed)
Patient admitted to room 2. Patient transferred via hospital bed with nurse and tech accompanying. Patient oriented to room and staff. Patient adjusting well to change.

## 2020-01-14 NOTE — H&P (Addendum)
Physical Medicine and Rehabilitation Admission H&P     HPI: Jill Rose is a 79 year old right-handed female with history of diet-controlled diabetes mellitus, hypertension, hypothyroidism, TIA maintained on aspirin. History taken from chart review and patient. Patient lives with spouse.  Two-level home with 3-4 steps to entry reportedly independent prior to admission.  She has a supportive family who plans to hire assist as needed.  She presented on 01/05/2020 with left hemiparesis. CT/MRI showed acute subacute nonhemorrhagic infarction involving the right corona radiata and right posterior temporal and occipital lobes. Remote lacunar infarct of the corona radiata bilaterally, left thalamus and left paramedian pons.  Patient did not receive TPA.  MRA with no proximal intracranial vessel occlusion.  CT angiogram of head and neck showed severe intracranial atherosclerotic disease without acute large vessel occlusion.  25% diameter stenosis proximal right ICA.  No significant left carotid stenosis.  Echocardiogram with EF of 70% without any wall motion abnormalities.  Admission chemistry sodium 132, potassium 5.3, glucose 135.  Neurology services consulted currently maintained on aspirin 325 mg and Plavix for CVA prophylaxis x3 months then Plavix alone.  Subcutaneous Lovenox for DVT prophylaxis.  Tolerating a regular diet.  Therapy evaluations completed and patient was admitted for a comprehensive rehab program. Please see preadmission assessment from earlier today as well.  Review of Systems  Constitutional: Positive for malaise/fatigue. Negative for chills and fever.  HENT: Negative for hearing loss.   Eyes: Negative for blurred vision and double vision.  Respiratory: Negative for cough and shortness of breath.   Cardiovascular: Negative for chest pain, palpitations and leg swelling.  Gastrointestinal: Positive for constipation. Negative for heartburn, nausea and vomiting.  Genitourinary:  Negative for flank pain and hematuria.  Musculoskeletal: Positive for myalgias.  Skin: Negative for rash.  Neurological: Positive for speech change, focal weakness and weakness. Negative for sensory change.  All other systems reviewed and are negative.  Past Medical History:  Diagnosis Date  . Diabetes mellitus without complication (HCC)   . Essential hypertension   . Hypothyroidism   . TIA (transient ischemic attack) 2018   No past surgical history. Family History  Problem Relation Age of Onset  . Heart disease Mother    Social History:  reports that she has never smoked. She has never used smokeless tobacco. She reports that she does not drink alcohol and does not use drugs. Allergies:  Allergies  Allergen Reactions  . Ammonia Shortness Of Breath, Itching and Swelling  . Peroxide [Hydrogen Peroxide] Shortness Of Breath, Itching and Swelling   Medications Prior to Admission  Medication Sig Dispense Refill  . aspirin EC 81 MG tablet Take 162 mg by mouth daily before supper. Swallow whole.    . b complex vitamins tablet Take 1 tablet by mouth daily with lunch.    [name] Grape-Goldenseal (BERBERINE COMPLEX PO) Take 1 tablet by mouth daily with lunch.    . Cholecalciferol (VITAMIN D3 PO) Take 1 tablet by mouth daily with lunch.     . Cyanocobalamin (VITAMIN B-12 PO) Take 1 tablet by mouth daily with lunch.    . levothyroxine (SYNTHROID) 88 MCG tablet Take 88 mcg by mouth daily before breakfast.    . losartan (COZAAR) 50 MG tablet Take 50 mg by mouth at bedtime.    . Menaquinone-7 (VITAMIN K2 PO) Take 1 tablet by mouth daily with lunch.      Drug Regimen Review Drug regimen was reviewed and remains appropriate with no significant issues identified  Home: Home Living Family/patient expects to be discharged to:: Private residence Living Arrangements: Spouse/significant other Available Help at Discharge: Family Type of Home: House Home Access: Stairs to enter Water quality scientist of Steps: 3-4 Entrance Stairs-Rails: Right, Left Home Layout: Two level Alternate Level Stairs-Number of Steps: flight Alternate Level Stairs-Rails: Left Bathroom Shower/Tub: Engineer, manufacturing systems: Standard Home Equipment: None  Lives With: Spouse   Functional History: Prior Function Level of Independence: Independent Comments: daughter reports recent use of AD with mobility  Functional Status:  Mobility: Bed Mobility Overal bed mobility: Needs Assistance Bed Mobility: Supine to Sit, Sit to Supine Supine to sit: Mod assist Sit to supine: Mod assist General bed mobility comments: pt OOB in recliner chair upon arrival Transfers Overall transfer level: Needs assistance Equipment used: Rolling walker (2 wheeled) Transfers: Sit to/from Stand Sit to Stand: Min assist, Mod assist Stand pivot transfers: Min assist General transfer comment: progressing from mod A to min A with 5x sit<>stand, cueing needed for increased anterior weight shift  Ambulation/Gait Ambulation/Gait assistance: Min assist, +2 safety/equipment (chair follow) Gait Distance (Feet): 55 Feet Assistive device: Rolling walker (2 wheeled) Gait Pattern/deviations: Step-to pattern, Decreased step length - right, Decreased step length - left, Decreased stride length, Shuffle General Gait Details: very poor foot clearance bilaterally, cueing for improved step length bilaterally; however, without constant cueing, pt reverting back to shuffling pattern.  Gait velocity: decreased    ADL: ADL Overall ADL's : Needs assistance/impaired Eating/Feeding: Set up, Sitting Grooming: Set up, Sitting, Oral care, Brushing hair Grooming Details (indicate cue type and reason): Setup for oral care seated in recliner Upper Body Bathing: Minimal assistance, Sitting Upper Body Bathing Details (indicate cue type and reason): Min A to bathe back  Lower Body Bathing: Moderate assistance Lower Body Bathing Details  (indicate cue type and reason): Mod A to bathe B lower legs safely and bottom in standing Upper Body Dressing : Set up, Sitting Upper Body Dressing Details (indicate cue type and reason): Setup to don hospital gown seated Lower Body Dressing: Moderate assistance, Sit to/from stand Lower Body Dressing Details (indicate cue type and reason): Mod A to don socks, don brief Toilet Transfer: Minimal assistance, Stand-pivot, BSC, Cueing for safety, Cueing for sequencing Toilet Transfer Details (indicate cue type and reason): Min A for RW SPT recliner <> BSC. Required cues to pick up feet, manuever RW and maintain steadiness in standing Toileting- Clothing Manipulation and Hygiene: Moderate assistance, Sit to/from stand Toileting - Clothing Manipulation Details (indicate cue type and reason): Mod A for posterior hygiene in standing and pulling up briefs. Pt able to doff briefs with Min A General ADL Comments: Pt with improved steadiness during ADL transfers and improving problem solving and directing for sequencing, but continues to require cues for safe completion  Cognition: Cognition Overall Cognitive Status: Impaired/Different from baseline Arousal/Alertness: Awake/alert Orientation Level: Oriented X4 Attention: Focused Focused Attention: Impaired Focused Attention Impairment: Verbal complex, Functional complex Memory: Impaired Memory Impairment: Storage deficit, Decreased short term memory Decreased Short Term Memory: Functional complex Awareness: Appears intact Problem Solving: Impaired Problem Solving Impairment: Verbal complex, Functional complex Executive Function: Reasoning, Sequencing, Organizing Reasoning: Appears intact Sequencing: Impaired Sequencing Impairment: Verbal complex, Functional complex Organizing: Impaired Organizing Impairment: Verbal complex, Functional complex Safety/Judgment: Appears intact Cognition Arousal/Alertness: Awake/alert Behavior During Therapy: WFL  for tasks assessed/performed, Impulsive Overall Cognitive Status: Impaired/Different from baseline Area of Impairment: Memory, Safety/judgement, Awareness Memory: Decreased short-term memory Safety/Judgement: Decreased awareness of deficits, Decreased awareness of safety Awareness: Intellectual  Problem Solving: Requires tactile cues, Requires verbal cues General Comments: requires multimodal cues for hand placement, RW manuevering, and taking bigger steps  Physical Exam: Blood pressure (!) 151/59, pulse 84, temperature 98.3 F (36.8 C), temperature source Oral, resp. rate 17, height 5\' 1"  (1.549 m), weight 73.3 kg, SpO2 97 %. Physical Exam Vitals reviewed.  Constitutional:      General: She is not in acute distress.    Appearance: She is obese.  HENT:     Head: Normocephalic and atraumatic.     Nose: Nose normal.  Eyes:     General:        Right eye: No discharge.        Left eye: No discharge.     Extraocular Movements: Extraocular movements intact.  Pulmonary:     Effort: Pulmonary effort is normal. No respiratory distress.     Breath sounds: No stridor.  Abdominal:     General: There is no distension.     Palpations: Abdomen is soft.  Musculoskeletal:     Cervical back: Neck supple.     Comments: No edema or tenderness in extremities  Skin:    General: Skin is warm and dry.  Neurological:     Mental Status: She is alert.     Comments: Alert She makes eye contact with examiner.   Follows simple commands.   Provides her name age date of birth with some delay in processing.   Appears to have a fair awareness of deficits. Motor: RUE/RLE: 5/5 proximal to distal LUE: 4+/5 proximal to distal LLE: 4-4+/5 proximal to distal LUE: Mild ataxia  Psychiatric:        Mood and Affect: Mood normal.        Behavior: Behavior normal.     Results for orders placed or performed during the hospital encounter of 01/05/20 (from the past 48 hour(s))  Glucose, capillary     Status:  Abnormal   Collection Time: 01/12/20  9:15 PM  Result Value Ref Range   Glucose-Capillary 129 (H) 70 - 99 mg/dL    Comment: Glucose reference range applies only to samples taken after fasting for at least 8 hours.  Glucose, capillary     Status: Abnormal   Collection Time: 01/13/20  6:02 AM  Result Value Ref Range   Glucose-Capillary 148 (H) 70 - 99 mg/dL    Comment: Glucose reference range applies only to samples taken after fasting for at least 8 hours.  Glucose, capillary     Status: Abnormal   Collection Time: 01/13/20 11:08 AM  Result Value Ref Range   Glucose-Capillary 170 (H) 70 - 99 mg/dL    Comment: Glucose reference range applies only to samples taken after fasting for at least 8 hours.  Glucose, capillary     Status: Abnormal   Collection Time: 01/13/20  4:35 PM  Result Value Ref Range   Glucose-Capillary 151 (H) 70 - 99 mg/dL    Comment: Glucose reference range applies only to samples taken after fasting for at least 8 hours.  Glucose, capillary     Status: Abnormal   Collection Time: 01/13/20  9:19 PM  Result Value Ref Range   Glucose-Capillary 211 (H) 70 - 99 mg/dL    Comment: Glucose reference range applies only to samples taken after fasting for at least 8 hours.  Glucose, capillary     Status: Abnormal   Collection Time: 01/14/20  6:14 AM  Result Value Ref Range   Glucose-Capillary 135 (H) 70 -  99 mg/dL    Comment: Glucose reference range applies only to samples taken after fasting for at least 8 hours.  Glucose, capillary     Status: Abnormal   Collection Time: 01/14/20 11:13 AM  Result Value Ref Range   Glucose-Capillary 134 (H) 70 - 99 mg/dL    Comment: Glucose reference range applies only to samples taken after fasting for at least 8 hours.  Glucose, capillary     Status: Abnormal   Collection Time: 01/14/20  4:06 PM  Result Value Ref Range   Glucose-Capillary 147 (H) 70 - 99 mg/dL    Comment: Glucose reference range applies only to samples taken after  fasting for at least 8 hours.   No results found.     Medical Problem List and Plan: 1.  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  Antithrombotics: -DVT/anticoagulation: Lovenox  -antiplatelet therapy: Aspirin 325 mg and Plavix 75 mg daily x3 months then Plavix alone 3. Pain Management: Tylenol as needed 4. Mood: Provide emotional support  -antipsychotic agents: N/A 5. Neuropsych: This patient is capable of making decisions on her own behalf. 6. Skin/Wound Care: Routine skin checks 7. Fluids/Electrolytes/Nutrition: Routine in and outs. CMP ordered 8.  Hypertension.  Cozaar 50 mg nightly, Norvasc 10 mg daily.    Monitor with increased mobility 9.  Diabetes mellitus with peripheral neuropathy.  Hemoglobin A1c 7.6.  SSI.  Monitor with increased mobility. 10.  Hypothyroidism.  Synthroid 11.  Hyperlipidemia.  Lipitor 12. Obesity: Encourage weight loss  Charlton Amor, PA-C 01/14/2020  I have personally performed a face to face diagnostic evaluation, including, but not limited to relevant history and physical exam findings, of this patient and developed relevant assessment and plan.  Additionally, I have reviewed and concur with the physician assistant's documentation above.  Maryla Morrow, MD, ABPMR  The patient's status has not changed. The original post admission physician evaluation remains appropriate, and any changes from the pre-admission screening or documentation from the acute chart are noted above.   Maryla Morrow, MD, ABPMR

## 2020-01-14 NOTE — Discharge Summary (Signed)
Please see d/c summary from 6/29.  No new changes overnight Patient being d/c'd to CIR Marlin Canary DO

## 2020-01-14 NOTE — Progress Notes (Signed)
[facility]                                                                              Patient Demographics  Leonetta Mcgivern, is a 79 y.o. female, DOB - 1941-02-05, ZOX:[id number]  Admit date - 01/05/2020   Admitting Physician Burnadette Pop, MD  Outpatient Primary MD for the patient is Ralene Ok, MD  Outpatient specialists:        Brief summary   Patient is a 79 year old female with history of hypertension, diabetes type 2, hypothyroidism, history of TIA who presented with left-sided weakness. She had weakness more on the left lower extremity than the upper. It hadbeen ongoing for last 1 week. She also feels that her speech is slurred. She had difficulty getting up the stairs due to weakness of the leg. CT head on presentation showed old left basal ganglia strokes. MRI of the brain showed acute/subacute nonhemorrhagic right-sided infarct. Patient admitted for stroke work-up. Neurology consulted. Work up completed.Physical therapy recommended inpatient rehab.  Continue to await insurance authorization   Assessment & Plan       Acute ischemic stroke [facility]) -Patient presented with left-sided weakness, slurred speech - MRI brain acute/subacute nonhemorrhagic infarcts involving the right corona radiata and posterior right temporal and occipital lobes,remote lacunar infarcts of the corona radiata bilaterally, left thalamus, and left paramedian pons. - MRA showed multifocal atherosclerosis no proximal occlusion.  -CTA head and neck showed severe intracranial atherosclerosis without large vessel occlusion. Proximal right ICA has 25% stenosis.  No significant left carotid stenosis.  Diffuse atherosclerotic disease in multiple areas of stenosis, severe stenosis distal left vertebral artery - LDL 196. Hemoglobin A1c  7.6 -2D echo showed EF of 65 to 70%, grade 1 diastolic dysfunction, no atrial shunt, no cardiac source of emboli -Neurology recommended aspirin 325 mg  daily and Plavix for 3 months then Plavix alone -Started on Lipitor  -Repeat CT head on 6/27 showed no acute intracranial abnormality  Hypertension: -BP improving, continue losartan, added amlodipine 10 mg daily   Hyperlipidemia: - LDL of 196, goal less than 70, continue Lipitor 80 mg.   Hypothyroidism: - Continue Synthyroid  Diabetes mellitus type II, NIDDM -Hemoglobin A1c 7.6, continue sliding scale insulin while inpatient -Will likely need Metformin on discharge  Obesity Estimated body mass index is 30.53 kg/m as calculated from the following:   Height as of this encounter:  (1.549 m).   Weight as of this encounter: 73.3 kg.   Chronic diastolic CHF 2D echo showed EF of 65 to 70%, grade 1 diastolic dysfunction, currently compensated  Code Status: Full CODE STATUS    Disposition Plan:     Status is: Inpatient  Remains inpatient appropriate because: Unsafe discharge plan   Dispo: The patient is from: Home              Anticipated d/c is to: CIR              Anticipated d/c date is: Whenever insurance approves              Patient currently is medically stable to d/c.  Awaiting insurance approval for CIR  bed      Time Spent in minutes   15 minutes  Consultants:   Neurology CIR  Antimicrobials:   Anti-infectives (From admission, onward)   None         Medications  Scheduled Meds: . amLODipine  10 mg Oral Daily  . aspirin  325 mg Oral Daily  . atorvastatin  80 mg Oral Daily  . clopidogrel  75 mg Oral Daily  . enoxaparin (LOVENOX) injection  40 mg Subcutaneous Q24H  . fluticasone  1 spray Each Nare Daily  . insulin aspart  0-5 Units Subcutaneous QHS  . insulin aspart  0-9 Units Subcutaneous TID WC  . levothyroxine  88 mcg Oral QAC breakfast  . losartan  50 mg Oral QHS  . sodium chloride flush  3 mL Intravenous Once   Continuous Infusions: PRN Meds:.acetaminophen **OR** acetaminophen (TYLENOL) oral liquid 160 mg/5 mL **OR**  acetaminophen, guaiFENesin-dextromethorphan      Subjective:   No overnight events, continues to wait for insurance approval for CIR  Objective:   Vitals:   01/13/20 2359 01/14/20 0500 01/14/20 0905 01/14/20 1303  BP: (!) 151/66 (!) 151/67 (!) 144/70 (!) 151/59  Pulse: 75 79 81 84  Resp: 18 18 18 17   Temp: 98.5 F (36.9 C) 98.5 F (36.9 C) (!) 97.2 F (36.2 C) 98.3 F (36.8 C)  TempSrc: Oral Oral Oral Oral  SpO2: 98% 97% 100% 97%  Weight:      Height:        Intake/Output Summary (Last 24 hours) at 01/14/2020 1308 Last data filed at 01/14/2020 0500 Gross per 24 hour  Intake 120 ml  Output 2000 ml  Net -1880 ml     Wt Readings from Last 3 Encounters:  01/10/20 73.3 kg  02/16/17 79.1 kg  01/28/17 81.2 kg     Exam  General: Alert and oriented x 3, NAD  Cardiovascular: S1 S2 auscultated, no murmurs, RRR  Respiratory: Clear to auscultation bilaterally, no wheezing, rales or rhonchi    Data Reviewed:  I have personally reviewed following labs and imaging studies  Micro Results Recent Results (from the past 240 hour(s))  SARS Coronavirus 2 by RT PCR (hospital order, performed in [facility] hospital lab) Nasopharyngeal Nasopharyngeal Swab     Status: None   Collection Time: 01/05/20  9:26 PM   Specimen: Nasopharyngeal Swab  Result Value Ref Range Status   SARS Coronavirus 2 NEGATIVE NEGATIVE Final    Comment: (NOTE) SARS-CoV-2 target nucleic acids are NOT DETECTED.  The SARS-CoV-2 RNA is generally detectable in upper and lower respiratory specimens during the acute phase of infection. The lowest concentration of SARS-CoV-2 viral copies this assay can detect is 250 copies / mL. A negative result does not preclude SARS-CoV-2 infection and should not be used as the sole basis for treatment or other patient management decisions.  A negative result may occur with improper specimen collection / handling, submission of specimen other than nasopharyngeal swab,  presence of viral mutation(s) within the areas targeted by this assay, and inadequate number of viral copies (<250 copies / mL). A negative result must be combined with clinical observations, patient history, and epidemiological information.  Fact Sheet for Patients:   BoilerBrush.com.cy  Fact Sheet for Healthcare Providers: https://pope.com/  This test is not yet approved or  cleared by the [place] FDA and has been authorized for detection and/or diagnosis of SARS-CoV-2 by FDA under an Emergency Use Authorization (EUA).  This EUA will remain in effect (  meaning this test can be used) for the duration of the COVID-19 declaration under Section 564(b)(1) of the Act, 21 U.S.C. section 360bbb-3(b)(1), unless the authorization is terminated or revoked sooner.  Performed at [facility], [address] [place], [place] [address]     Radiology Reports CT ANGIO HEAD W OR WO CONTRAST  Result Date: 01/06/2020 CLINICAL DATA:  Altered mental status. Stroke. Left-sided weakness. EXAM: CT ANGIOGRAPHY HEAD AND NECK TECHNIQUE: Multidetector CT imaging of the head and neck was performed using the standard protocol during bolus administration of intravenous contrast. Multiplanar CT image reconstructions and MIPs were obtained to evaluate the vascular anatomy. Carotid stenosis measurements (when applicable) are obtained utilizing NASCET criteria, using the distal internal carotid diameter as the denominator. CONTRAST:  80mL OMNIPAQUE IOHEXOL 350 MG/ML SOLN COMPARISON:  CT head 01/05/2020.  MRI head and MRA head 01/06/2020 FINDINGS: CTA NECK FINDINGS Aortic arch: 2 vessel arch with bovine branching pattern. Mild atherosclerotic disease in the aortic arch. Proximal great vessels widely patent. Right carotid system: Right common carotid artery widely patent. Medial deviation of the right carotid bifurcation posterior to the pharynx. Atherosclerotic  calcification in the proximal right internal carotid artery with 25% diameter stenosis. Left carotid system: Left carotid artery widely patent without stenosis. Mild atherosclerotic disease at the bifurcation. Medial deviation of the carotid bifurcation in the retropharyngeal space. Vertebral arteries: Right vertebral artery is patent to the basilar with mild scattered atherosclerotic disease Non dominant left vertebral artery with diffuse atherosclerotic disease. There are multiple areas of stenosis in the proximal and mid and distal left vertebral artery which is patent to the basilar. There is moderate to severe stenosis in the distal left vertebral artery at the skull base and in the left V3 and V4 segments. Skeleton: Cervical spondylosis and kyphosis. No acute skeletal abnormality. Other neck: Negative for mass or adenopathy in the neck. Upper chest: Lung apices clear bilaterally. Review of the MIP images confirms the above findings CTA HEAD FINDINGS Anterior circulation: Atherosclerotic calcification in the cavernous carotid bilaterally. Severe stenosis right supraclinoid internal carotid artery and mild to moderate stenosis right cavernous carotid. Left internal carotid artery is patent without stenosis. Hypoplastic right A1 segment. Anterior and middle cerebral arteries are patent bilaterally. Severe stenosis of the superior division of left M1 at the origin. No other significant stenosis in the anterior circulation. Posterior circulation: Severe stenosis in the distal left vertebral artery. Small basilar artery with severe stenosis in the midportion. Fetal origin of the posterior cerebral artery bilaterally. Moderate stenosis of the proximal and mid right posterior cerebral artery. Venous sinuses: Limited venous contrast due to arterial phase scanning. Anatomic variants: None Review of the MIP images confirms the above findings IMPRESSION: 1. Severe intracranial atherosclerotic disease without acute large  vessel occlusion. 2. 25% diameter stenosis proximal right internal carotid artery. No significant left carotid stenosis 3. Non dominant left vertebral artery with diffuse atherosclerotic disease and multiple areas of stenosis. Severe stenosis distal left vertebral artery. Right vertebral artery is disease without significant stenosis. Electronically Signed   By: Marlan Palau M.D.   On: 01/06/2020 14:31   DG Chest 2 View  Result Date: 01/05/2020 CLINICAL DATA:  Acute ischemic stroke. EXAM: CHEST - 2 VIEW COMPARISON:  10/17/2013 FINDINGS: Lower lung volumes from prior exam. There are patchy opacities at the left greater than right lung base. Mild cardiomegaly. Aortic atherosclerosis. No significant pleural effusion. There may be minimal fluid in the fissures. No pneumothorax. No acute osseous abnormalities  are seen. IMPRESSION: 1. Patchy bibasilar opacities, left greater than right, may be atelectasis or pneumonia. 2. Mild cardiomegaly.  Aortic Atherosclerosis (ICD10-I70.0). Electronically Signed   By: Narda RutherfordMelanie  Sanford M.D.   On: 01/05/2020 22:43   CT HEAD WO CONTRAST  Result Date: 01/11/2020 CLINICAL DATA:  Stroke, follow-up EXAM: CT HEAD WITHOUT CONTRAST TECHNIQUE: Contiguous axial images were obtained from the base of the skull through the vertex without intravenous contrast. 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 There is no acute intracranial hemorrhage or mass effect. No new loss of gray-white differentiation. 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 Ventricles are stable in size. Vascular: There is atherosclerotic calcification at the skull base. Skull: Calvarium is unremarkable. Sinuses/Orbits: Layering secretions in the right sphenoid sinus.  Other: Mastoid air cells are clear. IMPRESSION: No acute intracranial hemorrhage. Recent small infarcts better seen on MRI. Stable chronic findings detailed above. Electronically Signed   By: Guadlupe SpanishPraneil  Patel M.D.   On: 01/11/2020 13:21   CT HEAD WO CONTRAST  Result Date: 01/05/2020 CLINICAL DATA:  Increased left leg weakness. EXAM: CT HEAD WITHOUT CONTRAST TECHNIQUE: Contiguous axial images were obtained from the base of the skull through the vertex without intravenous contrast. COMPARISON:  January 26, 2017 FINDINGS: Brain: There is mild cerebral atrophy with widening of the extra-axial spaces and ventricular dilatation. There are areas of decreased attenuation within the white matter tracts of the supratentorial brain, consistent with microvascular disease changes. Small chronic left basal ganglia lacunar infarcts are noted. Vascular: No hyperdense vessel or unexpected calcification. Skull: Normal. Negative for fracture or focal lesion. Sinuses/Orbits: There is mild sphenoid sinus mucosal thickening. Other: None. IMPRESSION: 1. Generalized cerebral atrophy. 2. Small chronic left basal ganglia lacunar infarcts. 3. No acute intracranial abnormality. Electronically Signed   By: Aram Candelahaddeus  Houston M.D.   On: 01/05/2020 15:42   CT ANGIO NECK W OR WO CONTRAST  Result Date: 01/06/2020 CLINICAL DATA:  Altered mental status. Stroke. Left-sided weakness. EXAM: CT ANGIOGRAPHY HEAD AND NECK TECHNIQUE: Multidetector CT imaging of the head and neck was performed using the standard protocol during bolus administration of intravenous contrast. Multiplanar CT image reconstructions and MIPs were obtained to evaluate the vascular anatomy. Carotid stenosis measurements (when applicable) are obtained utilizing NASCET criteria, using the distal internal carotid diameter as the denominator. CONTRAST:  80mL OMNIPAQUE IOHEXOL 350 MG/ML SOLN COMPARISON:  CT head 01/05/2020.  MRI head and MRA head 01/06/2020 FINDINGS: CTA NECK FINDINGS  Aortic arch: 2 vessel arch with bovine branching pattern. Mild atherosclerotic disease in the aortic arch. Proximal great vessels widely patent. Right carotid system: Right common carotid artery widely patent. Medial deviation of the right carotid bifurcation posterior to the pharynx. Atherosclerotic calcification in the proximal right internal carotid artery with 25% diameter stenosis. Left carotid system: Left carotid artery widely patent without stenosis. Mild atherosclerotic disease at the bifurcation. Medial deviation of the carotid bifurcation in the retropharyngeal space. Vertebral arteries: Right vertebral artery is patent to the basilar with mild scattered atherosclerotic disease Non dominant left vertebral artery with diffuse atherosclerotic disease. There are multiple areas of stenosis in the proximal and mid and distal left vertebral artery which is patent to the basilar. There is moderate to severe stenosis in the distal left vertebral artery at the skull base and in the  left V3 and V4 segments. Skeleton: Cervical spondylosis and kyphosis. No acute skeletal abnormality. Other neck: Negative for mass or adenopathy in the neck. Upper chest: Lung apices clear bilaterally. Review of the MIP images confirms the above findings CTA HEAD FINDINGS Anterior circulation: Atherosclerotic calcification in the cavernous carotid bilaterally. Severe stenosis right supraclinoid internal carotid artery and mild to moderate stenosis right cavernous carotid. Left internal carotid artery is patent without stenosis. Hypoplastic right A1 segment. Anterior and middle cerebral arteries are patent bilaterally. Severe stenosis of the superior division of left M1 at the origin. No other significant stenosis in the anterior circulation. Posterior circulation: Severe stenosis in the distal left vertebral artery. Small basilar artery with severe stenosis in the midportion. Fetal origin of the posterior cerebral artery bilaterally.  Moderate stenosis of the proximal and mid right posterior cerebral artery. Venous sinuses: Limited venous contrast due to arterial phase scanning. Anatomic variants: None Review of the MIP images confirms the above findings IMPRESSION: 1. Severe intracranial atherosclerotic disease without acute large vessel occlusion. 2. 25% diameter stenosis proximal right internal carotid artery. No significant left carotid stenosis 3. Non dominant left vertebral artery with diffuse atherosclerotic disease and multiple areas of stenosis. Severe stenosis distal left vertebral artery. Right vertebral artery is disease without significant stenosis. Electronically Signed   By: Marlan Palau M.D.   On: 01/06/2020 14:31   MR ANGIO HEAD WO CONTRAST  Result Date: 01/06/2020 CLINICAL DATA:  Infarcts on MRI brain EXAM: MRA HEAD WITHOUT CONTRAST TECHNIQUE: Angiographic images of the Circle of Willis were obtained using MRA technique without intravenous contrast. COMPARISON:  None. FINDINGS: Motion artifact is present. Intracranial internal carotid arteries are patent with atherosclerotic irregularity and at least moderate stenosis of the supraclinoid portion. Middle and anterior cerebral arteries are patent. Right A1 ACA is congenitally small or stenotic. Proximal left M2 MCA moderate to marked stenosis. Intracranial vertebral arteries, basilar artery, posterior cerebral arteries are patent. Moderate to marked stenosis of the mid basilar. Bilateral PCA atherosclerotic irregularity with right P2 stenosis. Bilateral posterior communicating arteries are present. There is no aneurysm. IMPRESSION: No proximal intracranial vessel occlusion. Multifocal atherosclerotic irregularity and stenoses. Electronically Signed   By: Guadlupe Spanish M.D.   On: 01/06/2020 11:20   MR BRAIN WO CONTRAST  Result Date: 01/06/2020 CLINICAL DATA:  Left-sided weakness over the last week. EXAM: MRI HEAD WITHOUT CONTRAST TECHNIQUE: Multiplanar, multiecho pulse  sequences of the brain and surrounding structures were obtained without intravenous contrast. COMPARISON:  CT head without contrast 01/05/2020 FINDINGS: Brain: Arcs the diffusion-weighted images demonstrate acute/subacute nonhemorrhagic infarcts involving the right corona radiata. Additional subcortical white matter infarcts are present in the posterior right temporal and right occipital lobes. Minimal cortical involvement is present. Extensive white matter disease is present bilaterally. Remote lacunar infarcts are present within the corona radiata bilaterally. Remote lacunar infarcts are also present in the left lentiform nucleus. Remote lacunar infarct is present in the left thalamus. Remote white matter infarcts are present adjacent to the atrium of the right lateral ventricle. A remote lacunar infarct is present in the left paramedian pons. White matter changes extend through the brainstem. Remote lacunar infarcts are present in the posteroinferior right cerebellum. The ventricles are proportionate to the degree of atrophy. No significant extraaxial fluid collection is present. The internal auditory canals are within normal limits. Vascular: Flow is present in the major intracranial arteries. Skull and upper cervical spine: Mild degenerative changes are present in the upper cervical spine. Craniocervical junction is normal.  Marrow signal is normal. Sinuses/Orbits: A fluid level is present in the right sphenoid sinus. The paranasal sinuses and mastoid air cells are otherwise clear. Bilateral lens replacements are noted. Globes and orbits are otherwise unremarkable. IMPRESSION: 1. Acute/subacute nonhemorrhagic infarcts involving the right corona radiata and posterior right temporal and occipital lobes. Findings correspond to the subacute time frame of left-sided weakness. 2. Remote lacunar infarcts of the corona radiata bilaterally, left thalamus, and left paramedian pons. 3. Extensive white matter disease likely  reflects the sequela of chronic microvascular ischemia. 4. Right sphenoid sinus disease. Electronically Signed   By: Marin Roberts M.D.   On: 01/06/2020 05:33   ECHOCARDIOGRAM COMPLETE  Result Date: 01/06/2020    ECHOCARDIOGRAM REPORT   Patient Name:   MCKENNA GAMM Date of Exam: 01/06/2020 Medical Rec #:  [id number]      Height:       61.0 in Accession #:    [id number]     Weight:       174.4 lb Date of Birth:  June 28, 1941       BSA:          1.782 m Patient Age:    78 years       BP:           179/70 mmHg Patient Gender: F              HR:           88 bpm. Exam Location:  Inpatient Procedure: 2D Echo Indications:     Stroke 434.91 / I163.9  History:         Patient has prior history of Echocardiogram examinations, most                  recent 01/28/2017. TIA; Risk Factors:Hypertension and Diabetes.                  Thyroid disease.  Sonographer:     Leta Jungling RDCS Referring Phys:  [id number] [place] Diagnosing Phys: Armanda Magic MD  Sonographer Comments: No IV access. IMPRESSIONS  1. There is a mid cavitary resting gradient of due to hyperdynamic LVF.     Marland Kitchen Left ventricular ejection fraction, by estimation, is 65 to 70%. The left ventricle has normal function. The left ventricle has no regional wall motion abnormalities. Left ventricular diastolic parameters are consistent with Grade I diastolic dysfunction (impaired relaxation). Elevated left ventricular end-diastolic pressure.  2. Right ventricular systolic function is normal. The right ventricular size is normal. Tricuspid regurgitation signal is inadequate for assessing PA pressure.  3. The mitral valve is normal in structure. No evidence of mitral valve regurgitation. Mild mitral stenosis. The mean mitral valve gradient is 6.5 mmHg.  4. The aortic valve is tricuspid. Aortic valve regurgitation is not visualized. Mild to moderate aortic valve sclerosis/calcification is present, without any evidence of aortic stenosis. Aortic valve mean  gradient measures 9.0 mmHg. Aortic valve Vmax measures 1.97 m/s.  5. Aortic dilatation noted. 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  6. The inferior vena cava is normal in size with greater than 50% respiratory variability, suggesting right atrial pressure of 3 mmHg. FINDINGS  Left Ventricle: There is a mid cavitary resting gradient of due to hyperdynamic LVF. Left ventricular ejection fraction, by estimation, is 65 to 70%. The left ventricle has normal function. The left ventricle has no regional wall motion abnormalities. The left ventricular internal cavity size was normal  in size. There is no left ventricular hypertrophy. Left ventricular diastolic parameters are consistent with Grade I diastolic dysfunction (impaired relaxation). Elevated left ventricular end-diastolic pressure. Right Ventricle: The right ventricular size is normal. No increase in right ventricular wall thickness. Right ventricular systolic function is normal. Tricuspid regurgitation signal is inadequate for assessing PA pressure. Left Atrium: Left atrial size was normal in size. Right Atrium: Right atrial size was normal in size. Pericardium: There is no evidence of pericardial effusion. Mitral Valve: The mitral valve is normal in structure. Normal mobility of the mitral valve leaflets. Severe mitral annular calcification. No evidence of mitral valve regurgitation. Mild mitral valve stenosis. MV peak gradient, 18.6 mmHg. The mean mitral valve gradient is 6.5 mmHg. Tricuspid Valve: The tricuspid valve is normal in structure. Tricuspid valve regurgitation is not demonstrated. No evidence of tricuspid stenosis. Aortic Valve: The aortic valve is tricuspid. Aortic valve regurgitation is not visualized. Mild to moderate aortic valve sclerosis/calcification is present, without any evidence of aortic stenosis. Aortic valve mean gradient measures 9.0 mmHg. Aortic valve peak gradient measures 15.5 mmHg. Aortic valve  area, by VTI measures 1.06 cm. Pulmonic Valve: The pulmonic valve was normal in structure. Pulmonic valve regurgitation is trivial. No evidence of pulmonic stenosis. Aorta: Aortic dilatation noted. 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 Venous: The inferior vena cava was not well visualized. The inferior vena cava is normal in size with greater than 50% respiratory variability, suggesting right atrial pressure of 3 mmHg. IAS/Shunts: No atrial level shunt detected by color flow Doppler.  LEFT VENTRICLE PLAX 2D LVIDd:         3.80 cm  Diastology LVIDs:         2.60 cm  LV e' lateral:   4.46 cm/s LV PW:         1.00 cm  LV E/e' lateral: 26.2 LV IVS:        1.10 cm  LV e' medial:    4.13 cm/s LVOT diam:     1.60 cm  LV E/e' medial:  28.3 LV SV:         38 LV SV Index:   21 LVOT Area:     2.01 cm  RIGHT VENTRICLE TAPSE (M-mode): 1.6 cm LEFT ATRIUM             Index LA diam:        3.10 cm 1.74 cm/m LA Vol (A2C):   30.1 ml 16.89 ml/m LA Vol (A4C):   29.3 ml 16.44 ml/m LA Biplane Vol: 34.9 ml 19.59 ml/m  AORTIC VALVE AV Area (Vmax):    1.30 cm AV Area (Vmean):   1.34 cm AV Area (VTI):     1.06 cm AV Vmax:           197.00 cm/s AV Vmean:          135.000 cm/s AV VTI:            0.355 m AV Peak Grad:      15.5 mmHg AV Mean Grad:      9.0 mmHg LVOT Vmax:         127.00 cm/s LVOT Vmean:        89.900 cm/s LVOT VTI:          0.187 m LVOT/AV VTI ratio: 0.53  AORTA Ao Root diam: 3.40 cm MITRAL VALVE MV Area (PHT): 2.48 cm     SHUNTS MV Peak grad:  18.6 mmHg    Systemic  VTI:  0.19 m MV Mean grad:  6.5 mmHg     Systemic Diam: 1.60 cm MV Vmax:       2.16 m/s MV Vmean:      117.5 cm/s MV Decel Time: 306 msec MV E velocity: 117.00 cm/s MV A velocity: 209.00 cm/s MV E/A ratio:  0.56 Armanda Magic MD Electronically signed by Armanda Magic MD Signature Date/Time: 01/06/2020/8:59:43 AM    Final (Updated)     Lab Data:  CBC: No results for input(s): WBC, NEUTROABS, HGB, HCT, MCV, PLT in the last 168  hours. Basic Metabolic Panel: No results for input(s): NA, K, CL, CO2, GLUCOSE, BUN, CREATININE, CALCIUM, MG, PHOS in the last 168 hours. GFR: Estimated Creatinine Clearance: 54.8 mL/min (by C-G formula based on SCr of 0.72 mg/dL). Liver Function Tests: No results for input(s): AST, ALT, ALKPHOS, BILITOT, PROT, ALBUMIN in the last 168 hours. No results for input(s): LIPASE, AMYLASE in the last 168 hours. No results for input(s): AMMONIA in the last 168 hours. Coagulation Profile: No results for input(s): INR, PROTIME in the last 168 hours. Cardiac Enzymes: No results for input(s): CKTOTAL, CKMB, CKMBINDEX, TROPONINI in the last 168 hours. BNP (last 3 results) No results for input(s): PROBNP in the last 8760 hours. HbA1C: No results for input(s): HGBA1C in the last 72 hours. CBG: Recent Labs  Lab 01/13/20 1108 01/13/20 1635 01/13/20 2119 01/14/20 0614 01/14/20 1113  GLUCAP 170* 151* 211* 135* 134*   Lipid Profile: No results for input(s): CHOL, HDL, LDLCALC, TRIG, CHOLHDL, LDLDIRECT in the last 72 hours. Thyroid Function Tests: No results for input(s): TSH, T4TOTAL, FREET4, T3FREE, THYROIDAB in the last 72 hours. Anemia Panel: No results for input(s): VITAMINB12, FOLATE, FERRITIN, TIBC, IRON, RETICCTPCT in the last 72 hours. Urine analysis: No results found for: COLORURINE, APPEARANCEUR, LABSPEC, PHURINE, GLUCOSEU, HGBUR, BILIRUBINUR, KETONESUR, PROTEINUR, UROBILINOGEN, NITRITE, LEUKOCYTESUR   Carol Theys U Cephas Revard DO. [facility] 01/14/2020, 1:08 PM

## 2020-01-14 NOTE — IPOC Note (Signed)
Individualized overall Plan of Care [facility]) Patient Details Name: Jill Rose MRN: [id number] DOB: June 01, 1941  Admitting Diagnosis: Right middle cerebral artery stroke [facility])  Hospital Problems: Principal Problem:   Right middle cerebral artery stroke (HCC) Active Problems:   Elevated BUN   Hyponatremia   Diabetic peripheral neuropathy (HCC)     Functional Problem List: Nursing Endurance, Medication Management, Safety, Sensory  PT Balance, Endurance, Motor, Pain, Safety  OT Balance, Cognition, Endurance, Motor, Vision  SLP Cognition  TR         Basic ADL's: OT Bathing, Dressing, Toileting     Advanced  ADL's: OT       Transfers: PT Bed Mobility, Bed to Chair, Customer service manager, Tub/Shower     Locomotion: PT Ambulation, Stairs     Additional Impairments: OT    SLP Social Cognition   Memory, Problem Solving, Awareness  TR      Anticipated Outcomes Item Anticipated Outcome  Self Feeding Independent  Swallowing      Basic self-care  supervision  Toileting  supervision   Bathroom Transfers supervision  Bowel/Bladder  cont x 2, supervision assist  Transfers  supervision  Locomotion  supervision  Communication     Cognition  Supervision A  Pain  less than 3  Safety/Judgment  cues/reminders   Therapy Plan: PT Intensity: Minimum of 1-2 x/day ,45 to 90 minutes PT Frequency: 5 out of 7 days PT Duration Estimated Length of Stay: 11-14 days OT Intensity: Minimum of 1-2 x/day, 45 to 90 minutes OT Frequency: 5 out of 7 days, Total of 15 hours over 7 days of combined therapies OT Duration/Estimated Length of Stay: 14-16 days SLP Intensity: Minumum of 1-2 x/day, 30 to 90 minutes SLP Frequency: 3 to 5 out of 7 days SLP Duration/Estimated Length of Stay: ~2 weeks    Team Interventions: Nursing Interventions Patient/Family Education, Disease Management/Prevention, Medication Management, Discharge Planning  PT interventions Ambulation/gait training,  Community reintegration, DME/adaptive equipment instruction, Neuromuscular re-education, Psychosocial support, Stair training, UE/LE Strength taining/ROM, Wheelchair propulsion/positioning, Warden/ranger, Discharge planning, Pain management, Skin care/wound management, Therapeutic Activities, UE/LE Coordination activities, Cognitive remediation/compensation, Disease management/prevention, Functional mobility training, Splinting/orthotics, Therapeutic Exercise, Visual/perceptual remediation/compensation, Patient/family education, Functional electrical stimulation  OT Interventions Balance/vestibular training, Cognitive remediation/compensation, Discharge planning, DME/adaptive equipment instruction, Functional mobility training, Neuromuscular re-education, Patient/family education, Psychosocial support, Therapeutic Activities, Self Care/advanced ADL retraining, Therapeutic Exercise, UE/LE Strength taining/ROM, UE/LE Coordination activities, Visual/perceptual remediation/compensation  SLP Interventions Cueing hierarchy, Cognitive remediation/compensation, Internal/external aids, Functional tasks, Patient/family education, Therapeutic Activities  TR Interventions    SW/CM Interventions Discharge Planning, Psychosocial Support, Patient/Family Education   Barriers to Discharge MD  Medical stability  Nursing      PT Home environment access/layout    OT      SLP      SW       Team Discharge Planning: Destination: PT-Home ,OT- Home , SLP-Home Projected Follow-up: PT-Home health PT, 24 hour supervision/assistance, OT-  Outpatient OT, SLP-Home Health SLP, 24 hour supervision/assistance Projected Equipment Needs: PT-To be determined, OT- To be determined, SLP-None recommended by SLP Equipment Details: PT- , OT-  Patient/family involved in discharge planning: PT- Patient,  OT-Patient, Family member/caregiver, SLP-Patient  MD ELOS: 10-14 days. Medical Rehab Prognosis:  Good Assessment:  79 year old right-handed female with history of diet-controlled diabetes mellitus, hypertension, hypothyroidism, TIA maintained on aspirin. She presented on 01/05/2020 with left hemiparesis. CT/MRI showed acute subacute nonhemorrhagic infarction involving the right corona radiata and right posterior temporal and occipital lobes.  Remote lacunar infarct of the corona radiata bilaterally, left thalamus and left paramedian pons.  Patient did not receive TPA.  MRA with no proximal intracranial vessel occlusion.  CT angiogram of head and neck showed severe intracranial atherosclerotic disease without acute large vessel occlusion.  25% diameter stenosis proximal right ICA.  No significant left carotid stenosis.  Echocardiogram with EF of 70% without any wall motion abnormalities.  Admission chemistry sodium 132, potassium 5.3, glucose 135.  Neurology services consulted currently maintained on aspirin 325 mg and Plavix for CVA prophylaxis x3 months then Plavix alone.  Patient with resulting functional deficits with mobility, transfers, endurance, self-care.  Will set goals for Supervision with PT/OT.   Due to the current state of emergency, patients may not be receiving their 3-hours of Medicare-mandated therapy.  See Team Conference Notes for weekly updates to the plan of care

## 2020-01-14 NOTE — Progress Notes (Signed)
Inpatient Rehabilitation Medication Review by a Pharmacist  A complete drug regimen review was completed for this patient to identify any potential clinically significant medication issues.  Clinically significant medication issues were identified:  no  Pharmacist comments:   Time spent performing this drug regimen review (minutes):  10  Danae Orleans, PharmD, BCPS 01/14/2020       5:54 PM

## 2020-01-14 NOTE — Progress Notes (Signed)
Physical Therapy Treatment Patient Details Name: Jill Rose MRN: [id number] DOB: 05-02-1941 Today's Date: 01/14/2020    History of Present Illness Pt is a 79 y/o female admitted secondary to worsening L sided weakness. 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 PMH includes HTN.     PT Comments    Pt continuing to make steady progress with functional mobility. However, she continues to require frequent cueing and repetition with tasks to perform with appropriate technique and safety. Additionally with ambulation pt continues to demonstrate a very slow, shuffling gait pattern. With verbal cueing pt able to correct step length bilaterally; however, reverts back to shuffling steps without constant cueing. Per chart review, plan is to transfer to CIR today. Pt would continue to benefit from skilled physical therapy services at this time while admitted and after d/c to address the below listed limitations in order to improve overall safety and independence with functional mobility.   Follow Up Recommendations  CIR     Equipment Recommendations  Rolling walker with 5" wheels;3in1 (PT)    Recommendations for Other Services       Precautions / Restrictions Precautions Precautions: Fall Restrictions Weight Bearing Restrictions: No    Mobility  Bed Mobility               General bed mobility comments: pt OOB in recliner chair upon arrival  Transfers Overall transfer level: Needs assistance Equipment used: Rolling walker (2 wheeled) Transfers: Sit to/from Stand Sit to Stand: Min assist;Mod assist         General transfer comment: progressing from mod A to min A with 5x sit<>stand, cueing needed for increased anterior weight shift   Ambulation/Gait Ambulation/Gait assistance: Min assist;+2 safety/equipment (chair follow) Gait Distance (Feet): 55 Feet Assistive device: Rolling walker (2 wheeled) Gait Pattern/deviations: Step-to  pattern;Decreased step length - right;Decreased step length - left;Decreased stride length;Shuffle Gait velocity: decreased   General Gait Details: very poor foot clearance bilaterally, cueing for improved step length bilaterally; however, without constant cueing, pt reverting back to shuffling pattern.    Stairs             Wheelchair Mobility    Modified Rankin (Stroke Patients Only) Modified Rankin (Stroke Patients Only) Pre-Morbid Rankin Score: No symptoms Modified Rankin: Moderately severe disability     Balance Overall balance assessment: Needs assistance Sitting-balance support: No upper extremity supported;Feet supported Sitting balance-Leahy Scale: Good     Standing balance support: Bilateral upper extremity supported;During functional activity Standing balance-Leahy Scale: Poor Standing balance comment: frequent cueing needed for improved posture in standing and with walking; reliant on bilateral UE supports                            Cognition Arousal/Alertness: Awake/alert Behavior During Therapy: WFL for tasks assessed/performed;Impulsive Overall Cognitive Status: Impaired/Different from baseline Area of Impairment: Memory;Safety/judgement;Awareness                     Memory: Decreased short-term memory   Safety/Judgement: Decreased awareness of deficits;Decreased awareness of safety Awareness: Intellectual Problem Solving: Requires tactile cues;Requires verbal cues        Exercises General Exercises - Lower Extremity Long Arc Quad: AROM;Strengthening;Both;10 reps;Seated Hip ABduction/ADduction: AROM;Strengthening;Both;10 reps;Seated Hip Flexion/Marching: AROM;Strengthening;Both;10 reps;Seated    General Comments        Pertinent Vitals/Pain Pain Assessment: No/denies pain    Home Living  Prior Function            PT Goals (current goals can now be found in the care plan section) Acute  Rehab PT Goals PT Goal Formulation: With patient Time For Goal Achievement: 01/20/20 Potential to Achieve Goals: Good Progress towards PT goals: Progressing toward goals    Frequency    Min 4X/week      PT Plan Current plan remains appropriate    Co-evaluation              AM-PAC PT "6 Clicks" Mobility   Outcome Measure  Help needed turning from your back to your side while in a flat bed without using bedrails?: A Little Help needed moving from lying on your back to sitting on the side of a flat bed without using bedrails?: A Little Help needed moving to and from a bed to a chair (including a wheelchair)?: A Little Help needed standing up from a chair using your arms (e.g., wheelchair or bedside chair)?: A Lot Help needed to walk in hospital room?: A Little Help needed climbing 3-5 steps with a railing? : Total 6 Click Score: 15    End of Session Equipment Utilized During Treatment: Gait belt Activity Tolerance: Patient tolerated treatment well Patient left: in chair;with call bell/phone within reach;with chair alarm set;with family/visitor present Nurse Communication: Mobility status PT Visit Diagnosis: Unsteadiness on feet (R26.81);Difficulty in walking, not elsewhere classified (R26.2);Hemiplegia and hemiparesis Hemiplegia - Right/Left: Left Hemiplegia - caused by: Cerebral infarction     Time: 2297-9892 PT Time Calculation (min) (ACUTE ONLY): 27 min  Charges:  $Gait Training: 8-22 mins $Therapeutic Activity: 8-22 mins                     [name], DPT  Acute Rehabilitation Services Pager [phone] Office [phone]     [name] 01/14/2020, 4:24 PM

## 2020-01-14 NOTE — Progress Notes (Signed)
Jill Fennel, MD  Physician  Physical Medicine and Rehabilitation  Consult Note      Signed  Date of Service:  01/07/2020  6:17 AM      Related encounter: ED to Hosp-Admission (Discharged) from 01/05/2020 in [facility] GENERAL MED/SURG UNIT      Signed      Expand All Collapse All  Show:Clear all [x] Manual[x] Template[] Copied  Added by: [x] Angiulli, Mcarthur Rossetti, PA-C[x] Jill Fennel, MD  [] Hover for details          Physical Medicine and Rehabilitation Consult Reason for Consult: Left side weakness Referring Physician: [facility]   HPI: Jill Rose is a 79 y.o. right-handed female with history of diabetes mellitus, hypertension, hypothyroidism, TIA.  History taken from chart review and sister due to?  Mentation/language.  Patient lives with spouse.  Two-level home 3-4 steps to entry independent prior to admission.  She presented on 01/05/2020 with left hemiparesis.  CT/MRI showed acute subacute nonhemorrhagic infarct involving the right corona radiata and right posterior temporal and occipital lobes.  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  Patient did not receive TPA.  MRA with no proximal intracranial vessel occlusion.  CT angiogram of head and neck severe intracranial atherosclerotic disease without acute large vessel occlusion.  25% diameter stenosis proximal right ICA. No significant left carotid stenosis.  Echocardiogram with ejection fraction of 70% without wall motion abnormalities.  Admission chemistry sodium 132, potassium 5.3 glucose 135.  Neurology follow-up currently maintained on aspirin and Plavix for CVA prophylaxis.  Subcutaneous Lovenox for DVT prophylaxis.  Tolerating a regular diet.  Therapy evaluations completed with recommendations of physical medicine rehab consult.   Review of Systems  Constitutional: Positive for malaise/fatigue. Negative for chills and fever.  HENT: Negative for hearing  loss.   Eyes: Negative for blurred vision and double vision.  Respiratory: Negative for cough and shortness of breath.   Cardiovascular: Negative for chest pain and palpitations.  Gastrointestinal: Positive for constipation. Negative for heartburn, nausea and vomiting.  Genitourinary: Negative for dysuria, flank pain and hematuria.  Musculoskeletal: Positive for myalgias.  Skin: Negative for rash.  Neurological: Positive for focal weakness and weakness.  All other systems reviewed and are negative.       Past Medical History:  Diagnosis Date  . Diabetes mellitus without complication (HCC)    . Essential hypertension    . Hypothyroidism    . TIA (transient ischemic attack) 2018    History reviewed. No pertinent surgical history.      Family History  Problem Relation Age of Onset  . Heart disease Mother      Social History:  reports that she has never smoked. She has never used smokeless tobacco. She reports that she does not drink alcohol and does not use drugs. Allergies:  Allergies  Allergen Reactions  . Ammonia Shortness Of Breath, Itching and Swelling  . Peroxide [Hydrogen Peroxide] Shortness Of Breath, Itching and Swelling          Medications Prior to Admission  Medication Sig Dispense Refill  . aspirin EC 81 MG tablet Take 162 mg by mouth daily before supper. Swallow whole.      . b complex vitamins tablet Take 1 tablet by mouth daily with lunch.      [name] Grape-Goldenseal (BERBERINE COMPLEX PO) Take 1 tablet by mouth daily with lunch.      . Cholecalciferol (VITAMIN D3 PO) Take 1 tablet by mouth daily  with lunch.       . Cyanocobalamin (VITAMIN B-12 PO) Take 1 tablet by mouth daily with lunch.      . levothyroxine (SYNTHROID) 88 MCG tablet Take 88 mcg by mouth daily before breakfast.      . losartan (COZAAR) 50 MG tablet Take 50 mg by mouth at bedtime.      . Menaquinone-7 (VITAMIN K2 PO) Take 1 tablet by mouth daily with lunch.          Home: Home  Living Family/patient expects to be discharged to:: Private residence Living Arrangements: Spouse/significant other Available Help at Discharge: Family Type of Home: House Home Access: Stairs to enter Secretary/administrator of Steps: 3-4 Entrance Stairs-Rails: Right, Left Home Layout: Two level Alternate Level Stairs-Number of Steps: flight Alternate Level Stairs-Rails: Left Home Equipment: None  Functional History: Prior Function Level of Independence: Independent Functional Status:  Mobility: Bed Mobility Overal bed mobility: Needs Assistance Bed Mobility: Supine to Sit, Sit to Supine Supine to sit: Mod assist Sit to supine: Mod assist General bed mobility comments: Mod A for trunk assist and LE assist. Required assist to scoot hips to EOB as well. Increased time required.  Transfers Overall transfer level: Needs assistance Equipment used: 1 person hand held assist Transfers: Sit to/from Stand Sit to Stand: Mod assist General transfer comment: PT stood in front of pt and had pt hold to PT arms. Pt requiring mod A for lift assist and steadying to stand and required manual blocking of L knee. Pt unable to take steps at EOB this session.    ADL:   Cognition: Cognition Overall Cognitive Status: Within Functional Limits for tasks assessed Orientation Level: Oriented X4 Cognition Arousal/Alertness: Awake/alert Behavior During Therapy: WFL for tasks assessed/performed Overall Cognitive Status: Within Functional Limits for tasks assessed   Blood pressure (!) 169/76, pulse 75, temperature 98 F (36.7 C), temperature source Oral, resp. rate 18, SpO2 99 %. Physical Exam  Vitals reviewed. Constitutional: No distress.  HENT:  Head: Normocephalic and atraumatic.  Right Ear: External ear normal.  Left Ear: External ear normal.  Nose: Nose normal.  Eyes: Right eye exhibits no discharge. Left eye exhibits no discharge.  Respiratory: Effort normal. No stridor. No respiratory  distress.  GI: She exhibits no distension.  Musculoskeletal:     Cervical back: Normal range of motion.     Comments: Lower extremity edema  Neurological: She is alert.  Alert  Makes eye contact with examiner and follows simple commands.   Provides name and age with some delay in processing. Patient weakness Motor: Right upper extremities: 4+-5/5 proximal distal Left upper extremity: 4+/5 proximal distal Bilateral lower extremities:?  Limited due to language versus positioning - hip flexion, knee extension 3 -/5, ankle dorsiflexion, +/5  Skin: Skin is warm and dry.  Psychiatric:  ?  Altered mentation/slowed,?  Secondary to language-sister reports no cognitive issues      Lab Results Last 24 Hours       Results for orders placed or performed during the hospital encounter of 01/05/20 (from the past 24 hour(s))  Basic metabolic panel     Status: Abnormal    Collection Time: 01/06/20  8:42 AM  Result Value Ref Range    Sodium 137 135 - 145 mmol/L    Potassium 4.3 3.5 - 5.1 mmol/L    Chloride 102 98 - 111 mmol/L    CO2 24 22 - 32 mmol/L    Glucose, Bld 161 (H) 70 - 99 mg/dL  BUN 13 8 - 23 mg/dL    Creatinine, Ser 5.57 0.44 - 1.00 mg/dL    Calcium 9.1 8.9 - 32.2 mg/dL    GFR calc non Af Amer >60 >60 mL/min    GFR calc Af Amer >60 >60 mL/min    Anion gap 11 5 - 15  CBG monitoring, ED     Status: Abnormal    Collection Time: 01/06/20 12:02 PM  Result Value Ref Range    Glucose-Capillary 207 (H) 70 - 99 mg/dL  Glucose, capillary     Status: Abnormal    Collection Time: 01/06/20  5:03 PM  Result Value Ref Range    Glucose-Capillary 152 (H) 70 - 99 mg/dL  Glucose, capillary     Status: Abnormal    Collection Time: 01/06/20  9:28 PM  Result Value Ref Range    Glucose-Capillary 176 (H) 70 - 99 mg/dL  Glucose, capillary     Status: Abnormal    Collection Time: 01/07/20  6:03 AM  Result Value Ref Range    Glucose-Capillary 140 (H) 70 - 99 mg/dL       Imaging Results (Last 48  hours)  CT ANGIO HEAD W OR WO CONTRAST   Result Date: 01/06/2020 CLINICAL DATA:  Altered mental status. Stroke. Left-sided weakness. EXAM: CT ANGIOGRAPHY HEAD AND NECK TECHNIQUE: Multidetector CT imaging of the head and neck was performed using the standard protocol during bolus administration of intravenous contrast. Multiplanar CT image reconstructions and MIPs were obtained to evaluate the vascular anatomy. Carotid stenosis measurements (when applicable) are obtained utilizing NASCET criteria, using the distal internal carotid diameter as the denominator. CONTRAST:  53mL OMNIPAQUE IOHEXOL 350 MG/ML SOLN COMPARISON:  CT head 01/05/2020.  MRI head and MRA head 01/06/2020 FINDINGS: CTA NECK FINDINGS Aortic arch: 2 vessel arch with bovine branching pattern. Mild atherosclerotic disease in the aortic arch. Proximal great vessels widely patent. Right carotid system: Right common carotid artery widely patent. Medial deviation of the right carotid bifurcation posterior to the pharynx. Atherosclerotic calcification in the proximal right internal carotid artery with 25% diameter stenosis. Left carotid system: Left carotid artery widely patent without stenosis. Mild atherosclerotic disease at the bifurcation. Medial deviation of the carotid bifurcation in the retropharyngeal space. Vertebral arteries: Right vertebral artery is patent to the basilar with mild scattered atherosclerotic disease Non dominant left vertebral artery with diffuse atherosclerotic disease. There are multiple areas of stenosis in the proximal and mid and distal left vertebral artery which is patent to the basilar. There is moderate to severe stenosis in the distal left vertebral artery at the skull base and in the left V3 and V4 segments. Skeleton: Cervical spondylosis and kyphosis. No acute skeletal abnormality. Other neck: Negative for mass or adenopathy in the neck. Upper chest: Lung apices clear bilaterally. Review of the MIP images confirms  the above findings CTA HEAD FINDINGS Anterior circulation: Atherosclerotic calcification in the cavernous carotid bilaterally. Severe stenosis right supraclinoid internal carotid artery and mild to moderate stenosis right cavernous carotid. Left internal carotid artery is patent without stenosis. Hypoplastic right A1 segment. Anterior and middle cerebral arteries are patent bilaterally. Severe stenosis of the superior division of left M1 at the origin. No other significant stenosis in the anterior circulation. Posterior circulation: Severe stenosis in the distal left vertebral artery. Small basilar artery with severe stenosis in the midportion. Fetal origin of the posterior cerebral artery bilaterally. Moderate stenosis of the proximal and mid right posterior cerebral artery. Venous sinuses: Limited venous contrast due  to arterial phase scanning. Anatomic variants: None Review of the MIP images confirms the above findings IMPRESSION: 1. Severe intracranial atherosclerotic disease without acute large vessel occlusion. 2. 25% diameter stenosis proximal right internal carotid artery. No significant left carotid stenosis 3. Non dominant left vertebral artery with diffuse atherosclerotic disease and multiple areas of stenosis. Severe stenosis distal left vertebral artery. Right vertebral artery is disease without significant stenosis. Electronically Signed   By: Marlan Palauharles  Clark M.D.   On: 01/06/2020 14:31    DG Chest 2 View   Result Date: 01/05/2020 CLINICAL DATA:  Acute ischemic stroke. EXAM: CHEST - 2 VIEW COMPARISON:  10/17/2013 FINDINGS: Lower lung volumes from prior exam. There are patchy opacities at the left greater than right lung base. Mild cardiomegaly. Aortic atherosclerosis. No significant pleural effusion. There may be minimal fluid in the fissures. No pneumothorax. No acute osseous abnormalities are seen. IMPRESSION: 1. Patchy bibasilar opacities, left greater than right, may be atelectasis or pneumonia.  2. Mild cardiomegaly.  Aortic Atherosclerosis (ICD10-I70.0). Electronically Signed   By: Narda RutherfordMelanie  Sanford M.D.   On: 01/05/2020 22:43    CT HEAD WO CONTRAST   Result Date: 01/05/2020 CLINICAL DATA:  Increased left leg weakness. EXAM: CT HEAD WITHOUT CONTRAST TECHNIQUE: Contiguous axial images were obtained from the base of the skull through the vertex without intravenous contrast. COMPARISON:  January 26, 2017 FINDINGS: Brain: There is mild cerebral atrophy with widening of the extra-axial spaces and ventricular dilatation. There are areas of decreased attenuation within the white matter tracts of the supratentorial brain, consistent with microvascular disease changes. Small chronic left basal ganglia lacunar infarcts are noted. Vascular: No hyperdense vessel or unexpected calcification. Skull: Normal. Negative for fracture or focal lesion. Sinuses/Orbits: There is mild sphenoid sinus mucosal thickening. Other: None. IMPRESSION: 1. Generalized cerebral atrophy. 2. Small chronic left basal ganglia lacunar infarcts. 3. No acute intracranial abnormality. Electronically Signed   By: Aram Candelahaddeus  Houston M.D.   On: 01/05/2020 15:42    CT ANGIO NECK W OR WO CONTRAST   Result Date: 01/06/2020 CLINICAL DATA:  Altered mental status. Stroke. Left-sided weakness. EXAM: CT ANGIOGRAPHY HEAD AND NECK TECHNIQUE: Multidetector CT imaging of the head and neck was performed using the standard protocol during bolus administration of intravenous contrast. Multiplanar CT image reconstructions and MIPs were obtained to evaluate the vascular anatomy. Carotid stenosis measurements (when applicable) are obtained utilizing NASCET criteria, using the distal internal carotid diameter as the denominator. CONTRAST:  80mL OMNIPAQUE IOHEXOL 350 MG/ML SOLN COMPARISON:  CT head 01/05/2020.  MRI head and MRA head 01/06/2020 FINDINGS: CTA NECK FINDINGS Aortic arch: 2 vessel arch with bovine branching pattern. Mild atherosclerotic disease in the  aortic arch. Proximal great vessels widely patent. Right carotid system: Right common carotid artery widely patent. Medial deviation of the right carotid bifurcation posterior to the pharynx. Atherosclerotic calcification in the proximal right internal carotid artery with 25% diameter stenosis. Left carotid system: Left carotid artery widely patent without stenosis. Mild atherosclerotic disease at the bifurcation. Medial deviation of the carotid bifurcation in the retropharyngeal space. Vertebral arteries: Right vertebral artery is patent to the basilar with mild scattered atherosclerotic disease Non dominant left vertebral artery with diffuse atherosclerotic disease. There are multiple areas of stenosis in the proximal and mid and distal left vertebral artery which is patent to the basilar. There is moderate to severe stenosis in the distal left vertebral artery at the skull base and in the left V3 and V4 segments. Skeleton: Cervical spondylosis and  kyphosis. No acute skeletal abnormality. Other neck: Negative for mass or adenopathy in the neck. Upper chest: Lung apices clear bilaterally. Review of the MIP images confirms the above findings CTA HEAD FINDINGS Anterior circulation: Atherosclerotic calcification in the cavernous carotid bilaterally. Severe stenosis right supraclinoid internal carotid artery and mild to moderate stenosis right cavernous carotid. Left internal carotid artery is patent without stenosis. Hypoplastic right A1 segment. Anterior and middle cerebral arteries are patent bilaterally. Severe stenosis of the superior division of left M1 at the origin. No other significant stenosis in the anterior circulation. Posterior circulation: Severe stenosis in the distal left vertebral artery. Small basilar artery with severe stenosis in the midportion. Fetal origin of the posterior cerebral artery bilaterally. Moderate stenosis of the proximal and mid right posterior cerebral artery. Venous sinuses:  Limited venous contrast due to arterial phase scanning. Anatomic variants: None Review of the MIP images confirms the above findings IMPRESSION: 1. Severe intracranial atherosclerotic disease without acute large vessel occlusion. 2. 25% diameter stenosis proximal right internal carotid artery. No significant left carotid stenosis 3. Non dominant left vertebral artery with diffuse atherosclerotic disease and multiple areas of stenosis. Severe stenosis distal left vertebral artery. Right vertebral artery is disease without significant stenosis. Electronically Signed   By: Marlan Palau M.D.   On: 01/06/2020 14:31    MR ANGIO HEAD WO CONTRAST   Result Date: 01/06/2020 CLINICAL DATA:  Infarcts on MRI brain EXAM: MRA HEAD WITHOUT CONTRAST TECHNIQUE: Angiographic images of the Circle of Willis were obtained using MRA technique without intravenous contrast. COMPARISON:  None. FINDINGS: Motion artifact is present. Intracranial internal carotid arteries are patent with atherosclerotic irregularity and at least moderate stenosis of the supraclinoid portion. Middle and anterior cerebral arteries are patent. Right A1 ACA is congenitally small or stenotic. Proximal left M2 MCA moderate to marked stenosis. Intracranial vertebral arteries, basilar artery, posterior cerebral arteries are patent. Moderate to marked stenosis of the mid basilar. Bilateral PCA atherosclerotic irregularity with right P2 stenosis. Bilateral posterior communicating arteries are present. There is no aneurysm. IMPRESSION: No proximal intracranial vessel occlusion. Multifocal atherosclerotic irregularity and stenoses. Electronically Signed   By: Guadlupe Spanish M.D.   On: 01/06/2020 11:20    MR BRAIN WO CONTRAST   Result Date: 01/06/2020 CLINICAL DATA:  Left-sided weakness over the last week. EXAM: MRI HEAD WITHOUT CONTRAST TECHNIQUE: Multiplanar, multiecho pulse sequences of the brain and surrounding structures were obtained without intravenous  contrast. COMPARISON:  CT head without contrast 01/05/2020 FINDINGS: Brain: Arcs the diffusion-weighted images demonstrate acute/subacute nonhemorrhagic infarcts involving the right corona radiata. Additional subcortical white matter infarcts are present in the posterior right temporal and right occipital lobes. Minimal cortical involvement is present. Extensive white matter disease is present bilaterally. Remote lacunar infarcts are present within the corona radiata bilaterally. Remote lacunar infarcts are also present in the left lentiform nucleus. Remote lacunar infarct is present in the left thalamus. Remote white matter infarcts are present adjacent to the atrium of the right lateral ventricle. A remote lacunar infarct is present in the left paramedian pons. White matter changes extend through the brainstem. Remote lacunar infarcts are present in the posteroinferior right cerebellum. The ventricles are proportionate to the degree of atrophy. No significant extraaxial fluid collection is present. The internal auditory canals are within normal limits. Vascular: Flow is present in the major intracranial arteries. Skull and upper cervical spine: Mild degenerative changes are present in the upper cervical spine. Craniocervical junction is normal. Marrow signal is normal. Sinuses/Orbits:  A fluid level is present in the right sphenoid sinus. The paranasal sinuses and mastoid air cells are otherwise clear. Bilateral lens replacements are noted. Globes and orbits are otherwise unremarkable. IMPRESSION: 1. Acute/subacute nonhemorrhagic infarcts involving the right corona radiata and posterior right temporal and occipital lobes. Findings correspond to the subacute time frame of left-sided weakness. 2. Remote lacunar infarcts of the corona radiata bilaterally, left thalamus, and left paramedian pons. 3. Extensive white matter disease likely reflects the sequela of chronic microvascular ischemia. 4. Right sphenoid sinus  disease. Electronically Signed   By: Marin Roberts M.D.   On: 01/06/2020 05:33    ECHOCARDIOGRAM COMPLETE   Result Date: 01/06/2020    ECHOCARDIOGRAM REPORT   Patient Name:   Jill Rose Date of Exam: 01/06/2020 Medical Rec #:  [id number]      Height:       61.0 in Accession #:    [id number]     Weight:       174.4 lb Date of Birth:  01-Jun-1941       BSA:          1.782 m Patient Age:    78 years       BP:           179/70 mmHg Patient Gender: F              HR:           88 bpm. Exam Location:  Inpatient Procedure: 2D Echo Indications:     Stroke 434.91 / I163.9  History:         Patient has prior history of Echocardiogram examinations, most                  recent 01/28/2017. TIA; Risk Factors:Hypertension and Diabetes.                  Thyroid disease.  Sonographer:     Leta Jungling RDCS Referring Phys:  [id number] [place] Diagnosing Phys: Armanda Magic MD  Sonographer Comments: No IV access. IMPRESSIONS  1. There is a mid cavitary resting gradient of due to hyperdynamic LVF.     Marland Kitchen Left ventricular ejection fraction, by estimation, is 65 to 70%. The left ventricle has normal function. The left ventricle has no regional wall motion abnormalities. Left ventricular diastolic parameters are consistent with Grade I diastolic dysfunction (impaired relaxation). Elevated left ventricular end-diastolic pressure.  2. Right ventricular systolic function is normal. The right ventricular size is normal. Tricuspid regurgitation signal is inadequate for assessing PA pressure.  3. The mitral valve is normal in structure. No evidence of mitral valve regurgitation. Mild mitral stenosis. The mean mitral valve gradient is 6.5 mmHg.  4. The aortic valve is tricuspid. Aortic valve regurgitation is not visualized. Mild to moderate aortic valve sclerosis/calcification is present, without any evidence of aortic stenosis. Aortic valve mean gradient measures 9.0 mmHg. Aortic valve Vmax measures 1.97 m/s.  5. Aortic  dilatation noted. 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  6. The inferior vena cava is normal in size with greater than 50% respiratory variability, suggesting right atrial pressure of 3 mmHg. FINDINGS  Left Ventricle: There is a mid cavitary resting gradient of due to hyperdynamic LVF. Left ventricular ejection fraction, by estimation, is 65 to 70%. The left ventricle has normal function. The left ventricle has no regional wall motion abnormalities. The left ventricular internal cavity size was normal in size. There  is no left ventricular hypertrophy. Left ventricular diastolic parameters are consistent with Grade I diastolic dysfunction (impaired relaxation). Elevated left ventricular end-diastolic pressure. Right Ventricle: The right ventricular size is normal. No increase in right ventricular wall thickness. Right ventricular systolic function is normal. Tricuspid regurgitation signal is inadequate for assessing PA pressure. Left Atrium: Left atrial size was normal in size. Right Atrium: Right atrial size was normal in size. Pericardium: There is no evidence of pericardial effusion. Mitral Valve: The mitral valve is normal in structure. Normal mobility of the mitral valve leaflets. Severe mitral annular calcification. No evidence of mitral valve regurgitation. Mild mitral valve stenosis. MV peak gradient, 18.6 mmHg. The mean mitral valve gradient is 6.5 mmHg. Tricuspid Valve: The tricuspid valve is normal in structure. Tricuspid valve regurgitation is not demonstrated. No evidence of tricuspid stenosis. Aortic Valve: The aortic valve is tricuspid. Aortic valve regurgitation is not visualized. Mild to moderate aortic valve sclerosis/calcification is present, without any evidence of aortic stenosis. Aortic valve mean gradient measures 9.0 mmHg. Aortic valve peak gradient measures 15.5 mmHg. Aortic valve area, by VTI measures 1.06 cm. Pulmonic Valve: The pulmonic valve was normal  in structure. Pulmonic valve regurgitation is trivial. No evidence of pulmonic stenosis. Aorta: Aortic dilatation noted. 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 Venous: The inferior vena cava was not well visualized. The inferior vena cava is normal in size with greater than 50% respiratory variability, suggesting right atrial pressure of 3 mmHg. IAS/Shunts: No atrial level shunt detected by color flow Doppler.  LEFT VENTRICLE PLAX 2D LVIDd:         3.80 cm  Diastology LVIDs:         2.60 cm  LV e' lateral:   4.46 cm/s LV PW:         1.00 cm  LV E/e' lateral: 26.2 LV IVS:        1.10 cm  LV e' medial:    4.13 cm/s LVOT diam:     1.60 cm  LV E/e' medial:  28.3 LV SV:         38 LV SV Index:   21 LVOT Area:     2.01 cm  RIGHT VENTRICLE TAPSE (M-mode): 1.6 cm LEFT ATRIUM             Index LA diam:        3.10 cm 1.74 cm/m LA Vol (A2C):   30.1 ml 16.89 ml/m LA Vol (A4C):   29.3 ml 16.44 ml/m LA Biplane Vol: 34.9 ml 19.59 ml/m  AORTIC VALVE AV Area (Vmax):    1.30 cm AV Area (Vmean):   1.34 cm AV Area (VTI):     1.06 cm AV Vmax:           197.00 cm/s AV Vmean:          135.000 cm/s AV VTI:            0.355 m AV Peak Grad:      15.5 mmHg AV Mean Grad:      9.0 mmHg LVOT Vmax:         127.00 cm/s LVOT Vmean:        89.900 cm/s LVOT VTI:          0.187 m LVOT/AV VTI ratio: 0.53  AORTA Ao Root diam: 3.40 cm MITRAL VALVE MV Area (PHT): 2.48 cm     SHUNTS MV Peak grad:  18.6 mmHg    Systemic VTI:  0.19  m MV Mean grad:  6.5 mmHg     Systemic Diam: 1.60 cm MV Vmax:       2.16 m/s MV Vmean:      117.5 cm/s MV Decel Time: 306 msec MV E velocity: 117.00 cm/s MV A velocity: 209.00 cm/s MV E/A ratio:  0.56 Armanda Magic MD Electronically signed by Armanda Magic MD Signature Date/Time: 01/06/2020/8:59:43 AM    Final (Updated)      Assessment/Plan: Diagnosis: 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  Records reviewed and summated above.   1. Does the need for close, 24 hr/day medical supervision in concert with the patient's rehab needs make it unreasonable for this patient to be served in a less intensive setting? Yes 2. Co-Morbidities requiring supervision/potential complications: DM with hyperglycemia (Monitor in accordance with exercise and adjust meds as necessary), HTN (monitor and provide prns in accordance with increased physical exertion and pain), hypothyroidism (cont meds, ensure appropriate mood and energy level for therapies), history of TIA, leukocytosis (repeat labs, cont to monitor for signs and symptoms of infection, further workup if indicated), morbid obesity (encourage weight loss) 3. Due to bladder management, bowel management, safety, disease management, medication administration and patient education, does the patient require 24 hr/day rehab nursing? Yes 4. Does the patient require coordinated care of a physician, rehab nurse, therapy disciplines of PT/OT/SLP to address physical and functional deficits in the context of the above medical diagnosis(es)? Yes Addressing deficits in the following areas: balance, endurance, locomotion, strength, transferring, bathing, dressing, toileting, cognition and psychosocial support 5. Can the patient actively participate in an intensive therapy program of at least 3 hrs of therapy per day at least 5 days per week? Yes 6. The potential for patient to make measurable gains while on inpatient rehab is excellent 7. Anticipated functional outcomes upon discharge from inpatient rehab are supervision and min  assist  with PT, supervision and min assist with OT, modified independent and supervision with SLP. 8. Estimated rehab length of stay to reach the above functional goals is: 12-16 days. 9. Anticipated discharge destination: Home 10. Overall Rehab/Functional Prognosis: good   RECOMMENDATIONS: This patient's condition is appropriate for continued rehabilitative care in the following setting: CIR Patient has agreed to participate in recommended program. Potentially Note that insurance prior authorization may be required for reimbursement for recommended care.   Comment: Rehab Admissions Coordinator to follow up.   I have personally performed a face to face diagnostic evaluation, including, but not limited to relevant history and physical exam findings, of this patient and developed relevant assessment and plan.  Additionally, I have reviewed and concur with the physician assistant's documentation above.    Maryla Morrow, MD, ABPMR Mcarthur Rossetti Angiulli, PA-C 01/07/2020        Revision History                     Routing History                Note Details  Author Jill Fennel, MD File Time 01/07/2020  2:51 PM  Author Type Physician Status Signed  Last Editor Jill Fennel, MD Service Physical Medicine and [facility] Acct # [id number] Admit Date 01/14/2020

## 2020-01-14 NOTE — Progress Notes (Addendum)
Jamse Arn, MD  Physician  Physical Medicine and Rehabilitation  PMR Pre-admission      Addendum  Date of Service:  01/07/2020  4:30 PM      Related encounter: ED to Hosp-Admission (Discharged) from 01/05/2020 in [place]       Show:Clear all _0 Manual_1 Template_2 Copied  Added by: _3 Raechel Ache, Cyndia Bent, MD  _5 Hover for details PMR Admission Coordinator Pre-Admission Assessment   Patient: Jill Rose is an 79 y.o., female MRN: [id number] DOB: 1941-06-25 Height:   Weight:                                                                                                                                                     Insurance Information HMO:     PPO: yes     PCP:      IPA:      80/20:     OTHER:  PRIMARY: [employer] Medicare      Policy#: [id number]      Subscriber: patient CM NameNira Conn      Phone#: [phone]     Fax#: [phone] Pre-Cert#: [id number]      Employer:  Josem Kaufmann provided by Nira Conn for admit to CIR. Pt approved from 6/30 to 7/6 with clinical updates due 7/6 to [place] (p): [phone] (f): [phone] Benefits:  Phone #: online at [email or web address] provider portal, transaction ID [phone]     Name:  Eff. Date: 07/17/2017-07/16/2020     Deduct: does not have      Out of Pocket Max: $5,000 ($14 met)      Life Max: NA  CIR: $295/day co-pay with a max co-pay of $1,770/admission (6 days)      SNF: $0/day co-pay for days 1-20, $184/day co-pay for days 21-100; limited to 100 days Outpatient: $35/visit co-pay; limited by medical necessity     Home Health: 100% coverage, 0% co-insurance; limited by medical necessity     DME: 80% coverage, 20% co-insurance     Providers:  SECONDARY: None     Policy#:       Phone#:    Development worker, community:       Phone#:    The Therapist, art Information Summary" for patients in Inpatient Rehabilitation Facilities with attached "Privacy Act [place] Records" was  provided and verbally reviewed with: Patient and Family   Emergency Contact Information Contact Information       Name Relation Home Work Mobile    [place] Spouse [place] Daughter     [phone]         Current Medical History  Patient Admitting Diagnosis: 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  Per chart review patient lives with spouse.  Two-level home with 3-4 steps to entry reportedly independent prior to admission.  She has a supportive family who plans to hire assist as needed.  Presented January 05, 2020 with left-sided weakness.  CT/MRI showed acute subacute nonhemorrhagic infarction involving the right corona radiata and right posterior temporal and occipital lobes.  Remote lacunar infarct of the corona radiata bilaterally, left thalamus and left paramedian pons.  Patient did not receive TPA.  MRA with no proximal intracranial vessel occlusion.  CT angiogram of head and neck showed severe intracranial atherosclerotic disease without acute large vessel occlusion.  25% diameter stenosis proximal right ICA.  No significant left carotid stenosis.  Echocardiogram with ejection fraction of 70% without wall motion abnormalities.  Admission chemistry sodium 132, potassium 5.3, glucose 135.  Neurology services consulted currently maintained on aspirin 325 mg and Plavix for CVA prophylaxis x3 months then Plavix alone.  Subcutaneous Lovenox for DVT prophylaxis.  Tolerating a regular diet.  Therapy evaluations completed with recommendations for CIR. Patient is to be admitted for a comprehensive rehab program on 01/14/20.    Complete NIHSS TOTAL: 2 Glasgow Coma Scale Score: 15   Past Medical History      Past Medical History:  Diagnosis Date  . Diabetes mellitus without  complication ([place])    . Essential hypertension    . Hypothyroidism    . TIA (transient ischemic attack) 2018      Family History  family history includes Heart disease in her mother.   Prior Rehab/Hospitalizations:  Has the patient had prior rehab or hospitalizations prior to admission? No   Has the patient had major surgery during 100 days prior to admission? No   Current Medications    Current Facility-Administered Medications:  .  acetaminophen (TYLENOL) tablet 650 mg, 650 mg, Oral, Q4H PRN **OR** acetaminophen (TYLENOL) 160 MG/5ML solution 650 mg, 650 mg, Per Tube, Q4H PRN **OR** acetaminophen (TYLENOL) suppository 650 mg, 650 mg, Rectal, Q4H PRN, Etta Quill, DO .  amLODipine (NORVASC) tablet 10 mg, 10 mg, Oral, Daily, Adhikari, Amrit, MD, 10 mg at 01/08/20 1109 .  aspirin suppository 300 mg, 300 mg, Rectal, Daily **OR** aspirin tablet 325 mg, 325 mg, Oral, Daily, Jennette Kettle M, DO, 325 mg at 01/08/20 0825 .  atorvastatin (LIPITOR) tablet 80 mg, 80 mg, Oral, Daily, Adhikari, Amrit, MD .  clopidogrel (PLAVIX) tablet 75 mg, 75 mg, Oral, Daily, Greta Doom, MD, 75 mg at 01/08/20 0825 .  enoxaparin (LOVENOX) injection 40 mg, 40 mg, Subcutaneous, Q24H, Gardner, Jared M, DO, 40 mg at 01/08/20 0825 .  fluticasone (FLONASE) 50 MCG/ACT nasal spray 1 spray, 1 spray, Each Nare, Daily, Adhikari, Amrit, MD .  guaiFENesin-dextromethorphan (ROBITUSSIN DM) 100-10 MG/5ML syrup 5 mL, 5 mL, Oral, Q4H PRN, Adhikari, Amrit, MD .  insulin aspart (novoLOG) injection 0-5 Units, 0-5 Units, Subcutaneous, QHS, Adhikari, Amrit, MD .  insulin aspart (novoLOG) injection 0-9 Units, 0-9 Units, Subcutaneous, TID WC, Shelly Coss, MD, 3 Units at 01/07/20 1121 .  levothyroxine (SYNTHROID) tablet 88 mcg, 88 mcg, Oral, QAC breakfast, Etta Quill, DO, 88 mcg at 01/08/20 [phone] .  losartan (COZAAR) tablet 50 mg, 50 mg, Oral, QHS, Gardner, Jared M, DO, 50 mg at 01/07/20 2306 .  sodium chloride  flush (NS) 0.9 % injection 3 mL, 3 mL, Intravenous, Once, Davonna Belling, MD   Patients Current Diet:  Diet Order  Diet Heart Room service appropriate? Yes; Fluid consistency: Thin  Diet effective now                         Precautions / Restrictions Precautions Precautions: Fall Restrictions Weight Bearing Restrictions: No    Has the patient had 2 or more falls or a fall with injury in the past year?No   Prior Activity Level Limited Community (1-2x/wk): not working, retired; Independent without AD until [facility] (then began using RW for ambulation).    Prior Functional Level Prior Function Level of Independence: Independent Comments: daughter reports recent use of AD with mobility   Self Care: Did the patient need help bathing, dressing, using the toilet or eating?  Independent   Indoor Mobility: Did the patient need assistance with walking from room to room (with or without device)? Independent   Stairs: Did the patient need assistance with internal or external stairs (with or without device)? Independent   Functional Cognition: Did the patient need help planning regular tasks such as shopping or remembering to take medications? Independent   Home Assistive Devices / Equipment Home Assistive Devices/Equipment: Gilford Rile (specify type) Home Equipment: None   Prior Device Use: Indicate devices/aids used by the patient prior to current illness, exacerbation or injury?  no AD prior to [facility]; then RW use    Current Functional Level Cognition   Arousal/Alertness: Awake/alert Overall Cognitive Status: Impaired/Different from baseline Orientation Level: Oriented X4 Safety/Judgement: Decreased awareness of deficits Attention: Focused Focused Attention: Impaired Focused Attention Impairment: Verbal complex, Functional complex Memory: Impaired Memory Impairment: Storage deficit, Decreased short term memory Decreased Short Term Memory:  Functional complex Awareness: Appears intact Problem Solving: Impaired Problem Solving Impairment: Verbal complex, Functional complex Executive Function: Reasoning, Sequencing, Organizing Reasoning: Appears intact Sequencing: Impaired Sequencing Impairment: Verbal complex, Functional complex Organizing: Impaired Organizing Impairment: Verbal complex, Functional complex Safety/Judgment: Appears intact    Extremity Assessment (includes Sensation/Coordination)   Upper Extremity Assessment: LUE deficits/detail LUE Deficits / Details: grossly 3+/5 strength, impaired prorioception and fine motor coordination. Pt reports tactile sensation intact LUE Sensation: decreased proprioception LUE Coordination: decreased fine motor, decreased gross motor (gross motor vs. proprioception vs weakness)  Lower Extremity Assessment: Defer to PT evaluation LLE Deficits / Details: Grossly 3+/5 throughout LLE. Functional weakness noted as LLE would buckle     ADLs   Overall ADL's : Needs assistance/impaired Eating/Feeding: Set up, Sitting Grooming: Set up, Minimal assistance, Sitting Grooming Details (indicate cue type and reason): min A for throughness incorporating LUE Upper Body Bathing: Sitting, Moderate assistance Lower Body Bathing: Moderate assistance, Sit to/from stand Upper Body Dressing : Sitting, Moderate assistance Lower Body Dressing: Moderate assistance, Sit to/from stand Toilet Transfer: Moderate assistance, Stand-pivot, BSC, RW Toilet Transfer Details (indicate cue type and reason): simulated with EOB to recliner positioned on strong side. Extra time and assist to stabilze L knee. Pt unable to clear L foot from ground but can slide LLE a bit with assist  Toileting- Clothing Manipulation and Hygiene: Moderate assistance, Sit to/from stand General ADL Comments: Pt completed bed mobility, sat EOB a few minutes then completed SPT to recliner with mod A and rw utilized. Daughter present.        Mobility   Overal bed mobility: Needs Assistance Bed Mobility: Supine to Sit, Sit to Supine Supine to sit: Mod assist Sit to supine: Mod assist General bed mobility comments: mod A for movement of bilateral LEs off of and back onto bed, assistance  needed for trunk support as well     Transfers   Overall transfer level: Needs assistance Equipment used: 1 person hand held assist, Rolling walker (2 wheeled) Transfers: Sit to/from Stand Sit to Stand: Mod assist, Min assist Stand pivot transfers: Mod assist General transfer comment: pt performed multiple times (x10) from EOB; initially with [place] with mod A and then progressing to min A with use of RW. Initially therapist blocking L knee but no evidence of buckling today     Ambulation / Gait / Stairs / Wheelchair Mobility   Ambulation/Gait General Gait Details: pt able to take a few very small side steps at EOB with mod A and 1HHA. Pt with great difficulty advancing L LE in any direction     Posture / Balance Balance Overall balance assessment: Needs assistance Sitting-balance support: No upper extremity supported, Feet supported Sitting balance-Leahy Scale: Good Standing balance support: Bilateral upper extremity supported, During functional activity Standing balance-Leahy Scale: Poor Standing balance comment: Reliant on UE and external support      Special needs/care consideration Diabetic management : yes   Skin: avulsion to right, left, lower leg, ecchymosis to bilateral arms.    Designated visitor: TBD        Previous Environmental health practitioner (from acute therapy documentation) Living Arrangements: Spouse/significant other  Lives With: Spouse Available Help at Discharge: Family Type of Home: House Home Layout: Two level Alternate Level Stairs-Rails: Left Alternate Level Stairs-Number of Steps: flight Home Access: Stairs to enter Entrance Stairs-Rails: Right, Left Entrance Stairs-Number of Steps: 3-4 Bathroom Shower/Tub:  Chiropodist: [place]: No   Discharge Living Setting Plans for Discharge Living Setting: House (lives with husband) Type of Home at Discharge: House Discharge Home Layout: Two level, 1/2 bath on main level, Able to live on main level with bedroom/bathroom (plans to sponge bathe with 1/2 bath on first floor) Alternate Level Stairs-Rails: Left Alternate Level Stairs-Number of Steps: 16 Discharge Home Access: Ramped entrance Discharge Bathroom Shower/Tub: Tub/shower unit Discharge Bathroom Toilet: Standard Discharge Bathroom Accessibility: Yes How Accessible: Accessible via walker Does the patient have any problems obtaining your medications?: No   Social/Family/Support Systems Patient Roles: Spouse Contact Information: Harry Bark (spouse): [phone]; *but per pt, daugther Farrel Demark makes decisions (no phone number at this time) Anticipated Caregiver: spouse + hired assistance as needed Equities trader Information: see above Ability/Limitations of Caregiver: Supervision/Min G from the husband (pt willing to hire physical help through care agency) Caregiver Availability: 24/7 Discharge Plan Discussed with Primary Caregiver: No (discussed with pt and her sister; pt wants to talk to husbd) Is Caregiver In Agreement with Plan?:  (yes per daughter) Does Caregiver/Family have Issues with Lodging/Transportation while Pt is in Rehab?: No     Goals Patient/Family Goal for Rehab: PT/OT: Sup/Min A; SLP: Min A/supervision  Expected length of stay: 13-17 days Pt/Family Agrees to Admission and willing to participate: Yes Program Orientation Provided & Reviewed with Pt/Caregiver Including Roles  & Responsibilities: Yes (pt and her sister)  Barriers to Discharge: Home environment access/layout, Lack of/limited family support  Barriers to Discharge Comments: family addressing envionrmental concerns (moving bedroom downstairs, extending  half bath to full bath downstairs); family willing to hire assist if needed at [place].      Decrease burden of Care through IP rehab admission: NA     Possible need for SNF placement upon discharge: Not anticipated. Pt does NOT want to go to SNF and is willing to hire physical assist  through agencies if she can not reach expected level at DC from CIR. Anticipate pt can reach a supervision level, a level she reports her husband can care for her.      Patient Condition: This patient's medical and functional status has changed since the consult dated: 01/07/20 in which the Rehabilitation Physician determined and documented that the patient's condition is appropriate for intensive rehabilitative care in an inpatient rehabilitation facility. See "History of Present Illness" (above) for medical update. Functional changes are: improvement in transfers from Mod A to Min/Min A, and pt has now been able to progress to gait training at Mod A level 50 feet with RW; Pt has also been evaluated by OT with recommendations for CIR. Patient's medical and functional status update has been discussed with the Rehabilitation physician and patient remains appropriate for inpatient rehabilitation. Will admit to inpatient rehab today. Since PM&R consult completed by Dr. Delice Lesch on 01/07/20, the patient has been evaluated by OT with recommendation for CIR. Pt is currently Min to Mod A for ADLs. SLP has also evaluated and recommends CIR.    Preadmission Screen Completed By:  Raechel Ache, OT, 01/08/2020 11:12 AM ______________________________________________________________________   Discussed status with Dr. Posey Pronto on 01/14/20 at 4:13PM and received approval for admission today.   Admission Coordinator:  Raechel Ache, time 4:13PM/Date 01/14/20           Revision History                          Note Details  Author Posey Pronto, Domenick Bookbinder, MD File Time 01/14/2020  4:15 PM  Author Type Physician Status Addendum  Last Editor  Jamse Arn, MD Service Physical Medicine and [place] # [id number] [place] Date 01/14/2020

## 2020-01-14 NOTE — Progress Notes (Signed)
Inpatient Rehabilitation-Admissions Coordinator   I have received insurance approval and medical approval from Dr. Benjamine Mola for admit to CIR today. Notified pt and her family of bed offer and the pt has accepted. We reviewed insurance benefit letter and consent forms. All questions answered.   RN and [facility] team notified of plan for admit today.   Please call if questions.   Cheri Rous, OTR/L  Rehab Admissions Coordinator  [phone] 01/14/2020 4:15 PM

## 2020-01-14 NOTE — Telephone Encounter (Signed)
Pt was no show for appt 12/29/2019. No show letter has been mailed to pt. This was a new patient visit and first no show.   PCP,  Please reply back with corresponding letter matching appropriate follow up needs.  A - No follow up necessary B - Follow up urgent - locate patient immediately to schedule appointment. C - Follow up necessary. Contact patient and schedule visit w/in 7 days. D - Follow up necessary. Contact patient and schedule visit w/in 2-4 weeks.

## 2020-01-15 ENCOUNTER — Inpatient Hospital Stay (HOSPITAL_COMMUNITY): Payer: Medicare HMO

## 2020-01-15 ENCOUNTER — Inpatient Hospital Stay (HOSPITAL_COMMUNITY): Payer: Medicare HMO | Admitting: Speech Pathology

## 2020-01-15 ENCOUNTER — Inpatient Hospital Stay (HOSPITAL_COMMUNITY): Payer: Medicare HMO | Admitting: Occupational Therapy

## 2020-01-15 DIAGNOSIS — I63511 Cerebral infarction due to unspecified occlusion or stenosis of right middle cerebral artery: Secondary | ICD-10-CM

## 2020-01-15 DIAGNOSIS — G8194 Hemiplegia, unspecified affecting left nondominant side: Secondary | ICD-10-CM

## 2020-01-15 DIAGNOSIS — R799 Abnormal finding of blood chemistry, unspecified: Secondary | ICD-10-CM

## 2020-01-15 DIAGNOSIS — E871 Hypo-osmolality and hyponatremia: Secondary | ICD-10-CM

## 2020-01-15 DIAGNOSIS — I10 Essential (primary) hypertension: Secondary | ICD-10-CM

## 2020-01-15 DIAGNOSIS — E1142 Type 2 diabetes mellitus with diabetic polyneuropathy: Secondary | ICD-10-CM

## 2020-01-15 LAB — COMPREHENSIVE METABOLIC PANEL
ALT: 15 U/L (ref 0–44)
AST: 44 U/L — ABNORMAL HIGH (ref 15–41)
Albumin: 3.2 g/dL — ABNORMAL LOW (ref 3.5–5.0)
Alkaline Phosphatase: 42 U/L (ref 38–126)
Anion gap: 10 (ref 5–15)
BUN: 24 mg/dL — ABNORMAL HIGH (ref 8–23)
CO2: 23 mmol/L (ref 22–32)
Calcium: 9 mg/dL (ref 8.9–10.3)
Chloride: 101 mmol/L (ref 98–111)
Creatinine, Ser: 0.83 mg/dL (ref 0.44–1.00)
GFR calc Af Amer: >60 mL/min (ref >60)
GFR calc non Af Amer: >60 mL/min (ref >60)
Glucose, Bld: 140 mg/dL — ABNORMAL HIGH (ref 70–99)
Potassium: 5.1 mmol/L (ref 3.5–5.1)
Sodium: 134 mmol/L — ABNORMAL LOW (ref 135–145)
Total Bilirubin: 1.2 mg/dL (ref 0.3–1.2)
Total Protein: 7 g/dL (ref 6.5–8.1)

## 2020-01-15 LAB — CBC WITH DIFFERENTIAL/PLATELET
Abs Immature Granulocytes: 0.03 10*3/uL (ref 0.00–0.07)
Basophils Absolute: 0.1 10*3/uL (ref 0.0–0.1)
Basophils Relative: 1 %
Eosinophils Absolute: 0.4 10*3/uL (ref 0.0–0.5)
Eosinophils Relative: 4 %
HCT: 38.4 % (ref 36.0–46.0)
Hemoglobin: 12.6 g/dL (ref 12.0–15.0)
Immature Granulocytes: 0 %
Lymphocytes Relative: 36 %
Lymphs Abs: 3.4 10*3/uL (ref 0.7–4.0)
MCH: 27.8 pg (ref 26.0–34.0)
MCHC: 32.8 g/dL (ref 30.0–36.0)
MCV: 84.6 fL (ref 80.0–100.0)
Monocytes Absolute: 0.6 10*3/uL (ref 0.1–1.0)
Monocytes Relative: 7 %
Neutro Abs: 5.1 10*3/uL (ref 1.7–7.7)
Neutrophils Relative %: 52 %
Platelets: 263 10*3/uL (ref 150–400)
RBC: 4.54 MIL/uL (ref 3.87–5.11)
RDW: 15.3 % (ref 11.5–15.5)
WBC: 9.6 10*3/uL (ref 4.0–10.5)
nRBC: 0 % (ref 0.0–0.2)

## 2020-01-15 LAB — GLUCOSE, CAPILLARY
Glucose-Capillary: 139 mg/dL — ABNORMAL HIGH (ref 70–99)
Glucose-Capillary: 271 mg/dL — ABNORMAL HIGH (ref 70–99)
Glucose-Capillary: 116 mg/dL — ABNORMAL HIGH (ref 70–99)
Glucose-Capillary: 169 mg/dL — ABNORMAL HIGH (ref 70–99)

## 2020-01-15 MED ORDER — BLOOD PRESSURE CONTROL BOOK
Freq: Once | Status: AC
  Filled 2020-01-15: qty 1

## 2020-01-15 MED ORDER — LIVING WELL WITH DIABETES BOOK
Freq: Once | Status: AC
  Filled 2020-01-15: qty 1

## 2020-01-15 NOTE — Care Management (Signed)
Patient ID: Jill Rose, adult   DOB: 1940-08-21, 79 y.o.   MRN: [id number] Met with the patient to review role of Nurse CM and collaboration with SW Margreta Journey) to address nursing concerns and facilitate preparation for discharge home. Reviewed secondary stroke risk factors including HTN, DM  And HLD with DAPT x 3 months. Discussed CMM diet and DASH diet food options. Patient noted she does not eat a lot of sweets however she does eat a lot of starchy foods/rice and no sweeteners but uses honey in her coffee. Reported she was in the process of changing PCPs when she had a stroke and will be rescheduling an appointment with [place]. No other concerns noted at present.

## 2020-01-15 NOTE — Evaluation (Signed)
Occupational Therapy Assessment and Plan  Patient Details  Name: Jill Rose MRN: [id number] Date of Birth: 1940-10-23  OT Diagnosis: cognitive deficits, hemiplegia affecting non-dominant side and muscle weakness (generalized) Rehab Potential: Rehab Potential (ACUTE ONLY): Excellent ELOS: 14-16 days   Today's Date: 01/15/2020 OT Individual Time: 0930-1030 OT Individual Time Calculation (min): 60 min     Hospital Problem: Principal Problem:   Right middle cerebral artery stroke (HCC) Active Problems:   Elevated BUN   Hyponatremia   Diabetic peripheral neuropathy (HCC)   Past Medical History:  Past Medical History:  Diagnosis Date  . Diabetes mellitus without complication ([facility])   . Essential hypertension   . Hypothyroidism   . TIA (transient ischemic attack) 2018   Past Surgical History: History reviewed. No pertinent surgical history.  Assessment & Plan Clinical Impression:   Jill Rose is a 79 year old right-handed female with history of diet-controlled diabetes mellitus, hypertension, hypothyroidism, TIA maintained on aspirin. History taken from chart review and patient. Patient lives with spouse. Two-level home with 3-4 steps to entry reportedly independent prior to admission. She has a supportive family who plans to hire assist as needed. She presented on 01/05/2020 with left hemiparesis. CT/MRI showed acute subacute nonhemorrhagic infarction involving the right corona radiata and right posterior temporal and occipital lobes. Remote lacunar infarct of the corona radiata bilaterally, left thalamus and left paramedian pons. Patient did not receive TPA. MRA with no proximal intracranial vessel occlusion. CT angiogram of head and neck showed severe intracranial atherosclerotic disease without acute large vessel occlusion. 25% diameter stenosis proximal right ICA. No significant left carotid stenosis. Echocardiogram with EF of 70% without any wall motion abnormalities. Admission  chemistry sodium 132, potassium 5.3, glucose 135. Neurology services consulted currently maintained on aspirin 325 mg and Plavix for CVA prophylaxis x3 months then Plavix alone. Subcutaneous Lovenox for DVT prophylaxis. Tolerating a regular diet. Therapy evaluations completed and patient was admitted for a comprehensive rehab program. Please see preadmission assessment from earlier today as well.   Patient transferred to CIR on 01/14/2020 .    Patient currently requires mod with basic self-care skills secondary to muscle weakness, decreased cardiorespiratoy endurance, abnormal tone, unbalanced muscle activation and decreased coordination, decreased visual motor skills and field cut, decreased awareness, decreased problem solving, decreased safety awareness, decreased memory and delayed processing and decreased sitting balance, decreased standing balance, decreased postural control, hemiplegia and decreased balance strategies.  Prior to hospitalization, patient was fully independent.  Patient will benefit from skilled intervention to increase independence with basic self-care skills prior to discharge home with care partner.  Anticipate patient will require 24 hour supervision and follow up outpatient.  OT - End of Session Activity Tolerance: Tolerates 10 - 20 min activity with multiple rests Endurance Deficit: Yes OT Assessment Rehab Potential (ACUTE ONLY): Excellent OT Patient demonstrates impairments in the following area(s): Balance;Cognition;Endurance;Motor;Vision OT Basic ADL's Functional Problem(s): Bathing;Dressing;Toileting OT Transfers Functional Problem(s): Toilet;Tub/Shower OT Plan OT Intensity: Minimum of 1-2 x/day, 45 to 90 minutes OT Frequency: 5 out of 7 days;Total of 15 hours over 7 days of combined therapies OT Duration/Estimated Length of Stay: 14-16 days OT Treatment/Interventions: Balance/vestibular training;Cognitive remediation/compensation;Discharge planning;DME/adaptive  equipment instruction;Functional mobility training;Neuromuscular re-education;Patient/family education;Psychosocial support;Therapeutic Activities;Self Care/advanced ADL retraining;Therapeutic Exercise;UE/LE Strength taining/ROM;UE/LE Coordination activities;Visual/perceptual remediation/compensation OT Self Feeding Anticipated Outcome(s): Independent OT Basic Self-Care Anticipated Outcome(s): supervision OT Toileting Anticipated Outcome(s): supervision OT Bathroom Transfers Anticipated Outcome(s): supervision OT Recommendation Patient destination: Home Follow Up Recommendations: Outpatient OT Equipment Recommended: To be determined  Skilled Therapeutic Intervention Pt seen for initial evaluation and ADL training with a focus on use of her L side. Reviewed role of OT, discussed pt's goals, discussed POC with pt and her sister.  Pt engaged in self care (see ADL documentation) using her L hand quite well even though she has some weakness and incoordination. Pt resting in wc at end of session with all needs met. Chair pad alarm on.  OT Evaluation Precautions/Restrictions  Precautions Precautions: Fall Restrictions Weight Bearing Restrictions: No  Pain Pain Assessment Pain Scale: 0-10 Pain Score: 0-No pain Home Living/Prior Functioning Home Living Family/patient expects to be discharged to:: Private residence Living Arrangements: Spouse/significant other Available Help at Discharge: Family Type of Home: House Home Access: Stairs to enter Technical brewer of Steps: 3-4 Entrance Stairs-Rails: Right, Left Home Layout: Two level Alternate Level Stairs-Number of Steps: flight Alternate Level Stairs-Rails: Left Bathroom Shower/Tub: Tub/shower unit  Lives With: Spouse Prior Function Level of Independence: Independent with basic ADLs, Independent with homemaking with ambulation, Independent with gait, Independent with transfers  Able to Take Stairs?: Yes Driving: No Vocation:  Retired ADL ADL Eating: Set up Grooming: Setup Where Assessed-Grooming: Sitting at sink Upper Body Bathing: Setup Where Assessed-Upper Body Bathing: Sitting at sink Lower Body Bathing: Moderate assistance Where Assessed-Lower Body Bathing: Sitting at sink, Standing at sink Upper Body Dressing: Supervision/safety Where Assessed-Upper Body Dressing: Sitting at sink Lower Body Dressing: Moderate assistance Where Assessed-Lower Body Dressing: Sitting at sink, Standing at sink Toileting: Minimal assistance Where Assessed-Toileting: Glass blower/designer: Moderate assistance Toilet Transfer Method: Stand pivot Vision Baseline Vision/History: Wears glasses Wears Glasses: Reading only Patient Visual Report: No change from baseline Vision Assessment?: Yes Eye Alignment: Within Functional Limits Ocular Range of Motion: Within Functional Limits Tracking/Visual Pursuits: Unable to hold eye position out of midline;Requires cues, head turns, or add eye shifts to track;Decreased smoothness of vertical tracking;Decreased smoothness of horizontal tracking Convergence: Impaired (comment) Visual Fields: Left visual field deficit (45 field cut) Perception  Perception: Within Functional Limits Praxis Praxis: Intact Cognition Overall Cognitive Status: Impaired/Different from baseline Arousal/Alertness: Awake/alert Orientation Level: Person;Place;Situation Person: Oriented Place: Oriented Situation: Oriented Year: 2021 Month: July Day of Week: Correct Memory: Impaired Memory Impairment: Storage deficit;Retrieval deficit;Decreased short term memory Decreased Short Term Memory: Verbal basic;Functional basic Immediate Memory Recall: Sock;Blue;Bed Memory Recall Sock: With Cue Memory Recall Blue: Without Cue Memory Recall Bed: Without Cue Attention: Sustained Sustained Attention: Appears intact Awareness: Impaired Awareness Impairment: Emergent impairment Problem Solving: Impaired Problem  Solving Impairment: Functional complex;Verbal basic Executive Function: Reasoning;Sequencing;Organizing Reasoning: Appears intact Sequencing: Appears intact Organizing: Impaired Organizing Impairment: Functional complex Safety/Judgment: Appears intact Sensation Sensation Light Touch: Appears Intact Hot/Cold: Appears Intact Proprioception: Appears Intact Stereognosis: Appears Intact Coordination Gross Motor Movements are Fluid and Coordinated: No Fine Motor Movements are Fluid and Coordinated: No Coordination and Movement Description: slow movement patterns, but able to open/ close containers Finger Nose Finger Test: delayed on B sides but slower on L vs R Motor  Motor Motor: Hemiplegia;Abnormal tone Motor - Skilled Clinical Observations: mild LUE weakness with mild tone in biceps Mobility  Transfers Sit to Stand: Minimal Assistance - Patient > 75% Stand to Sit: Minimal Assistance - Patient > 75%  Trunk/Postural Assessment  Cervical Assessment Cervical Assessment: Within Functional Limits Thoracic Assessment Thoracic Assessment: Within Functional Limits Lumbar Assessment Lumbar Assessment: Within Functional Limits Postural Control Postural Control: Deficits on evaluation Protective Responses: delayed  Balance Static Sitting Balance Static Sitting - Level of Assistance: 5: Stand by assistance Dynamic  Sitting Balance Dynamic Sitting - Level of Assistance: 4: Min assist Static Standing Balance Static Standing - Level of Assistance: 4: Min assist Dynamic Standing Balance Dynamic Standing - Level of Assistance: 3: Mod assist Extremity/Trunk Assessment RUE Assessment RUE Assessment: Within Functional Limits General Strength Comments: Pt reports that she has been having difficulty writing her name. to observe further as this may be due to visual deficits. LUE Assessment LUE Assessment: Exceptions to [facility] Passive Range of Motion (PROM) Comments: WFL Active Range of Motion  (AROM) Comments: shoulder flexion 160, elbow extension -20 General Strength Comments: grasp 4-/5     Refer to Care Plan for Long Term Goals  Recommendations for other services: None    Discharge Criteria: Patient will be discharged from OT if patient refuses treatment 3 consecutive times without medical reason, if treatment goals not met, if there is a change in medical status, if patient makes no progress towards goals or if patient is discharged from hospital.  The above assessment, treatment plan, treatment alternatives and goals were discussed and mutually agreed upon: by patient  [place] 01/15/2020, 1:05 PM

## 2020-01-15 NOTE — Progress Notes (Signed)
Inpatient Rehabilitation Care Coordinator Assessment and Plan  Patient Details  Name: Jill Rose MRN: [id number] Date of Birth: 1940-10-30  Today's Date: 01/15/2020  Problem List:  Patient Active Problem List   Diagnosis Date Noted   Elevated BUN    Hyponatremia    Diabetic peripheral neuropathy (HCC)    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   Dyslipidemia    Class 1 obesity due to excess calories with serious comorbidity and body mass index (BMI) of 30.0 to 30.9 in adult    Left hemiparesis (HCC)    Controlled type 2 diabetes mellitus with hyperglycemia (HCC)    Morbid obesity (HCC)    Leukocytosis    History of TIA (transient ischemic attack)    Acute CVA (cerebrovascular accident) (HCC) 01/06/2020   Acute ischemic stroke (HCC) 01/05/2020   Hypertensive emergency 01/26/2017   TIA (transient ischemic attack) 01/26/2017   Hypothyroidism 01/26/2017   Aphasia    Essential hypertension    Diabetes mellitus without complication (HCC)    Past Medical History:  Past Medical History:  Diagnosis Date   Diabetes mellitus without complication (HCC)    Essential hypertension    Hypothyroidism    TIA (transient ischemic attack) 2018   Past Surgical History: History reviewed. No pertinent surgical history. Social History:  reports that she has never smoked. She has never used smokeless tobacco. She reports that she does not drink alcohol and does not use drugs.  Family / Support Systems Spouse/Significant Other: John Other Supports: Family lives in same neighborhood (sisters, etc) Anticipated Caregiver: Spouse and siter Ability/Limitations of Caregiver: none Caregiver Availability: 24/7  Social History Preferred language: English Religion: Christian Read: Yes Write: Yes Employment Status: Disabled   Abuse/Neglect Abuse/Neglect Assessment Can Be Completed: Yes Physical Abuse: Denies Verbal Abuse: Denies Sexual Abuse:  Denies Exploitation of patient/patient's resources: Denies Self-Neglect: Denies  Emotional Status Pt's affect, behavior and adjustment status: no Recent Psychosocial Issues: no Psychiatric History: no Substance Abuse History: no  Patient / Family Perceptions, Expectations & Goals Pt/Family understanding of illness & functional limitations: yes Pt/family expectations/goals: Goal to discharge home- SNF not anticipated  [employer]: None Premorbid Home Care/DME Agencies: None Transportation available at discharge: family able to transport  Discharge Planning Living Arrangements: Spouse/significant other, Other relatives Support Systems: Spouse/significant other, Other relatives Type of Residence: Private residence (2 level home, ramp entrance. Patient bedroom and bath set on 1st level) Insurance Resources: Media planner (specify) Administrator) Does the patient have any problems obtaining your medications?: No Care Coordinator Anticipated Follow Up Needs: HH/OP  Clinical Impression SW entered room, introduced self and explain role. SW will continue to follow up with questions and concerns   Andria Rhein 01/15/2020, 1:12 PM

## 2020-01-15 NOTE — Plan of Care (Signed)
  Problem: Consults Goal: RH STROKE PATIENT EDUCATION Description: See Patient Education module for education specifics  Outcome: Progressing Goal: Diabetes Guidelines if Diabetic/Glucose > 140 Description: If diabetic or lab glucose is > 140 mg/dl - Initiate Diabetes/Hyperglycemia Guidelines & Document Interventions  Outcome: Progressing   Problem: RH BLADDER ELIMINATION Goal: RH STG MANAGE BLADDER WITH ASSISTANCE Description: STG Manage Bladder With supervision Assistance Outcome: Progressing   Problem: RH SKIN INTEGRITY Goal: RH STG SKIN FREE OF INFECTION/BREAKDOWN Description: Pt will be free of skin breakdown/infection prior to DC with supervision assist Outcome: Progressing Goal: RH STG MAINTAIN SKIN INTEGRITY WITH ASSISTANCE Description: STG Maintain Skin Integrity With supervision Assistance. Outcome: Progressing   Problem: RH SAFETY Goal: RH STG ADHERE TO SAFETY PRECAUTIONS W/ASSISTANCE/DEVICE Description: STG Adhere to Safety Precautions With cues/reminders Assistance/Device. Outcome: Progressing   Problem: RH KNOWLEDGE DEFICIT Goal: RH STG INCREASE KNOWLEDGE OF DIABETES Description: Pt/family will be able to demonstrate knowledge of DM management with medications and diet precautions and ways to monitor for hypo/hyperglycemia with cues/reminders assist using handouts/booklets provided by staff Outcome: Progressing Goal: RH STG INCREASE KNOWLEDGE OF HYPERTENSION Description: Pt/family will be able to demonstrate knowledge of HTN management with medications and diet precautions and ways to monitor blood pressure with cues/reminders assist using handouts/booklets provided by staff Outcome: Progressing Goal: RH STG INCREASE KNOWLEGDE OF HYPERLIPIDEMIA Description: Pt/family will be able to demonstrate knowledge of HLD management with medications and diet precautions with cues/reminders assist using handouts/booklets provided by staff Outcome: Progressing Goal: RH STG  INCREASE KNOWLEDGE OF STROKE PROPHYLAXIS Description: Pt/family will be able to demonstrate knowledge of stroke prevention with medications and diet precautions and ways to monitor signs/symptoms of stroke with cues/reminders assist using handouts/booklets provided by staff Outcome: Progressing   

## 2020-01-15 NOTE — Progress Notes (Signed)
[place] PHYSICAL MEDICINE & REHABILITATION PROGRESS NOTE  Subjective/Complaints: Patient seen sitting up in bed this morning.  She states she slept fairly overnight, due to being in new location.  She states she is ready begin therapies.  ROS: Denies CP, SOB, N/V/D  Objective: Vital Signs: Blood pressure (!) 123/58, pulse 72, temperature 98.1 F (36.7 C), temperature source Oral, resp. rate 15, height 5' 1.02" (1.55 m), weight 76.8 kg, SpO2 98 %. No results found. Recent Labs    01/14/20 1821 01/15/20 0253  WBC 10.7* 9.6  HGB 12.3 12.6  HCT 38.6 38.4  PLT 251 263   Recent Labs    01/14/20 1821 01/15/20 0253  NA  --  134*  K  --  5.1  CL  --  101  CO2  --  23  GLUCOSE  --  140*  BUN  --  24*  CREATININE 1.00 0.83  CALCIUM  --  9.0    Physical Exam: BP (!) 123/58 (BP Location: Right Arm)   Pulse 72   Temp 98.1 F (36.7 C) (Oral)   Resp 15   Ht 5' 1.02" (1.55 m)   Wt 76.8 kg   SpO2 98%   BMI 31.97 kg/m  Constitutional: No distress . Vital signs reviewed. HENT: Normocephalic.  Atraumatic. Eyes: EOMI. No discharge. Cardiovascular: No JVD. Respiratory: Normal effort.  No stridor. GI: Non-distended. Skin: Warm and dry.  Intact. Psych: Normal mood.  Normal behavior. Musc: No edema in extremities.  No tenderness in extremities. Neuro: Alert She makes eye contact with examiner.   Follows simple commands.   Appears to have a fair awareness of deficits. Motor: RUE/RLE: 5/5 proximal to distal LUE: 4+/5 proximal to distal LLE: 4+/5 proximal to distal LUE: Mild ataxia   Assessment/Plan: 1. 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  Physiatrist is providing close team supervision and 24 hour management of active medical problems listed below.  Physiatrist and rehab team continue to assess barriers to discharge/monitor patient progress toward  functional and medical goals  Care Tool:  Bathing              Bathing assist       Upper Body Dressing/Undressing Upper body dressing        Upper body assist      Lower Body Dressing/Undressing Lower body dressing      What is the patient wearing?: Incontinence brief     Lower body assist Assist for lower body dressing: Moderate Assistance - Patient 50 - 74%     Toileting Toileting    Toileting assist Assist for toileting: Minimal Assistance - Patient > 75%     Transfers Chair/bed transfer  Transfers assist     Chair/bed transfer assist level: Minimal Assistance - Patient > 75%     Locomotion Ambulation   Ambulation assist              Walk 10 feet activity   Assist           Walk 50 feet activity   Assist           Walk 150 feet activity   Assist           Walk 10 feet on uneven surface  activity   Assist           Wheelchair     Assist  Wheelchair 50 feet with 2 turns activity    Assist            Wheelchair 150 feet activity     Assist            Medical Problem List and Plan: 1.  Left side hemiparesis secondary to right MCA infarction is felt to be thromboembolic secondary to small and large vessel disease  Begin CIR evaluations  2.  Antithrombotics: -DVT/anticoagulation: Lovenox             -antiplatelet therapy: Aspirin 325 mg and Plavix 75 mg daily x3 months then Plavix alone 3. Pain Management: Tylenol as needed 4. Mood: Provide emotional support             -antipsychotic agents: N/A 5. Neuropsych: This patient is capable of making decisions on her own behalf. 6. Skin/Wound Care: Routine skin checks 7. Fluids/Electrolytes/Nutrition: Routine in and outs.  8.  Hypertension.  Cozaar 50 mg nightly, Norvasc 10 mg daily.    Monitor with increased mobility 9.  Diabetes mellitus with peripheral neuropathy.  Hemoglobin A1c 7.6.  SSI.  Monitor with increased  mobility 10.  Hypothyroidism.  Synthroid 11.  Hyperlipidemia.  Lipitor 12. Obesity: Encourage weight loss 13.  Hyponatremia  Sodium 134 on 7/1  Continue to monitor 14.  Elevated BUN  Encourage fluids   LOS: 1 days A FACE TO FACE EVALUATION WAS PERFORMED  Jill Rose [place] 01/15/2020, 8:23 AM

## 2020-01-15 NOTE — Telephone Encounter (Signed)
Looks as though she may have just been discharged from the hospital. Allow her to reschedule as needed.

## 2020-01-15 NOTE — Progress Notes (Signed)
Inpatient Rehabilitation  Patient information reviewed and entered into eRehab system by Calven Gilkes M. Jabaree Mercado, M.A., CCC/SLP, PPS Coordinator.  Information including medical coding, functional ability and quality indicators will be reviewed and updated through discharge.    

## 2020-01-15 NOTE — Evaluation (Signed)
Physical Therapy Assessment and Plan  Patient Details  Name: Jill Rose MRN: [id number] Date of Birth: 01/30/1941  PT Diagnosis: Difficulty walking, Hemiplegia non-dominant and Muscle weakness Rehab Potential: Good ELOS: 11-14 days   Today's Date: 01/15/2020 PT Individual Time: 1607-3710 PT Individual Time Calculation (min): 73 min    Hospital Problem: Principal Problem:   Right middle cerebral artery stroke [facility])   Past Medical History:  Past Medical History:  Diagnosis Date  . Diabetes mellitus without complication ([place])   . Essential hypertension   . Hypothyroidism   . TIA (transient ischemic attack) 2018   Past Surgical History: History reviewed. No pertinent surgical history.  Assessment & Plan Clinical Impression: Patient is a 79 year old right-handed female with history of diet-controlled diabetes mellitus, hypertension, hypothyroidism, TIA maintained on aspirin. History taken from chart review and patient. Patient lives with spouse.  Two-level home with 3-4 steps to entry reportedly independent prior to admission.  She has a supportive family who plans to hire assist as needed.  She presented on 01/05/2020 with left hemiparesis. CT/MRI showed acute subacute nonhemorrhagic infarction involving the right corona radiata and right posterior temporal and occipital lobes. Remote lacunar infarct of the corona radiata bilaterally, left thalamus and left paramedian pons.  Patient did not receive TPA.  MRA with no proximal intracranial vessel occlusion.  CT angiogram of head and neck showed severe intracranial atherosclerotic disease without acute large vessel occlusion.  25% diameter stenosis proximal right ICA.  No significant left carotid stenosis.  Echocardiogram with EF of 70% without any wall motion abnormalities.  Admission chemistry sodium 132, potassium 5.3, glucose 135.  Neurology services consulted currently maintained on aspirin 325 mg and Plavix for CVA prophylaxis x3 months  then Plavix alone.  Subcutaneous Lovenox for DVT prophylaxis.  Tolerating a regular diet.  Patient transferred to CIR on 01/14/2020 .   Patient currently requires min with mobility secondary to muscle weakness, decreased cardiorespiratoy endurance, impaired timing and sequencing, decreased coordination and decreased motor planning and decreased sitting balance, decreased standing balance, decreased postural control, hemiplegia and decreased balance strategies.  Prior to hospitalization, patient was independent  with mobility and lived with Spouse in a House home.  Home access is 3-4 .  Patient will benefit from skilled PT intervention to maximize safe functional mobility, minimize fall risk and decrease caregiver burden for planned discharge home with 24 hour supervision.  Anticipate patient will benefit from follow up [place] at discharge.  PT - End of Session Activity Tolerance: Tolerates 30+ min activity with multiple rests Endurance Deficit: Yes Endurance Deficit Description: requires multiple rest breaks PT Assessment Rehab Potential (ACUTE/IP ONLY): Good PT Barriers to Discharge: Home environment access/layout PT Patient demonstrates impairments in the following area(s): Balance;Endurance;Motor;Pain;Safety PT Transfers Functional Problem(s): Bed Mobility;Bed to Chair;Car PT Locomotion Functional Problem(s): Ambulation;Stairs PT Plan PT Intensity: Minimum of 1-2 x/day ,45 to 90 minutes PT Frequency: 5 out of 7 days PT Duration Estimated Length of Stay: 11-14 days PT Treatment/Interventions: Ambulation/gait training;Community reintegration;DME/adaptive equipment instruction;Neuromuscular re-education;Psychosocial support;Stair training;UE/LE Strength taining/ROM;Wheelchair propulsion/positioning;Balance/vestibular training;Discharge planning;Pain management;Skin care/wound management;Therapeutic Activities;UE/LE Coordination activities;Cognitive remediation/compensation;Disease  management/prevention;Functional mobility training;Splinting/orthotics;Therapeutic Exercise;Visual/perceptual remediation/compensation;Patient/family education;Functional electrical stimulation PT Transfers Anticipated Outcome(s): supervision PT Locomotion Anticipated Outcome(s): supervision PT Recommendation Follow Up Recommendations: Home health PT;24 hour supervision/assistance Patient destination: Home Equipment Recommended: To be determined  Skilled Therapeutic Intervention  Evaluation completed (see details above and below) with education on PT POC and goals and individual treatment initiated with focus on bed mobility, balance, transfers, ambulation, and  stair training.  Pt received seated in WC and agreeable to therapy. Sit to stand and transfer to bed with HHA modA facilitation at LLE and hips for extension and verbal cues for hand placement and positioning for safety. Sit<>supine with modA at trunk and LLE and transfer back to [facility] with modA. WC transport to gym for time management. Transfer from [employer] with modA and stand pivot. Pt then perform standing balance training with PT blocking LLE and pt performing targeted stepping with RLE. Pt requires minA/modA for balance and demos anxiety about falling. PT provides RW and pt performance greatly improves, able to step with high knees and maintain balance with minA/CGA. Pt ambulates 26' with RW and trendelenburg gait pattern on LLE but no knee buckles. Pt reports pain in L shoulder during ambulation. PT provides rest breaks as needed to alleviate pain symptoms. Pt performs 4 steps with BHRs and minA at LLE for progression and positioning. Pt ambulates 25' with mirror for visual feedback and using RUE on wall rail and LUE around PT, with PT assisting with tactile cues at L hip for improved stability and contraction during stance phase. Pt performs stand step transfer to recliner with RW and left seated in recliner with alarm intact and all needs  within reach.   PT Evaluation Precautions/Restrictions Precautions Precautions: Fall Restrictions Weight Bearing Restrictions: No Home Living/Prior Functioning Home Living Living Arrangements: Spouse/significant other;Other relatives Available Help at Discharge: Family Type of Home: House Home Access: Stairs to enter [employer] of Steps: 3-4 Entrance Stairs-Rails: Right;Left Home Layout: Two level Alternate Level Stairs-Number of Steps: 16 Alternate Level Stairs-Rails: Left Bathroom Shower/Tub: Tub/shower unit  Lives With: Spouse Prior Function Level of Independence: Independent with basic ADLs;Independent with homemaking with ambulation;Independent with gait;Independent with transfers  Able to Take Stairs?: Yes Driving: No Vocation: Retired Vision/Perception  Vision - Risk analyst: Within Functional Limits Ocular Range of Motion: Within Functional Limits Tracking/Visual Pursuits: Unable to hold eye position out of midline;Requires cues, head turns, or add eye shifts to track;Decreased smoothness of vertical tracking;Decreased smoothness of horizontal tracking Convergence: Impaired (comment) Perception Perception: Within Functional Limits Praxis Praxis: Intact  Cognition Overall Cognitive Status: Impaired/Different from baseline Arousal/Alertness: Awake/alert Orientation Level: Oriented X4 Attention: Sustained Sustained Attention: Appears intact Memory: Impaired Memory Impairment: Storage deficit;Retrieval deficit;Decreased short term memory Decreased Short Term Memory: Verbal basic;Functional basic Immediate Memory Recall: Sock;Blue;Bed Memory Recall Sock: With Cue Memory Recall Blue: Without Cue Memory Recall Bed: Without Cue Awareness: Impaired Awareness Impairment: Emergent impairment Problem Solving: Impaired Problem Solving Impairment: Functional complex;Verbal basic Executive Function: Reasoning;Sequencing;Organizing Reasoning:  Appears intact Sequencing: Appears intact Organizing: Impaired Organizing Impairment: Functional complex Safety/Judgment: Appears intact Sensation Sensation Light Touch: Appears Intact Hot/Cold: Appears Intact Proprioception: Appears Intact Stereognosis: Appears Intact Coordination Gross Motor Movements are Fluid and Coordinated: No Fine Motor Movements are Fluid and Coordinated: No Coordination and Movement Description: slow movement patterns, but able to open/ close containers Finger Nose Finger Test: delayed on B sides but slower on L vs R Heel Shin Test: very slow on both sides. unable to make smooth movement with LLE Motor  Motor Motor: Hemiplegia Motor - Skilled Clinical Observations: mild LUE weakness with mild tone in biceps  Mobility Bed Mobility Bed Mobility: Supine to Sit;Sit to Supine Supine to Sit: Moderate Assistance - Patient 50-74% Sit to Supine: Moderate Assistance - Patient 50-74% Transfers Transfers: Sit to Stand;Stand to Sit;Stand Pivot Transfers Sit to Stand: Minimal Assistance - Patient > 75% Stand to Sit: Minimal Assistance -  Patient > 75% Stand Pivot Transfers: Moderate Assistance - Patient 50 - 74% Stand Pivot Transfer Details: Tactile cues for posture;Verbal cues for sequencing;Tactile cues for weight shifting;Verbal cues for technique Transfer (Assistive device): Rolling walker Locomotion  Gait Gait Distance (Feet): 40 Feet Gait Gait: Yes Gait Pattern: Impaired Gait Pattern: Trendelenburg;Shuffle Gait velocity: decreased Stairs / Additional Locomotion Stairs: Yes Stairs Assistance: Minimal Assistance - Patient > 75% Stair Management Technique: Two rails Number of Stairs: 4 Height of Stairs: 6 Wheelchair Mobility Wheelchair Mobility: No  Trunk/Postural Assessment  Cervical Assessment Cervical Assessment: Within Functional Limits Thoracic Assessment Thoracic Assessment: Within Functional Limits Lumbar Assessment Lumbar Assessment: Within  Functional Limits Postural Control Postural Control: Deficits on evaluation Protective Responses: delayed  Balance Static Sitting Balance Static Sitting - Level of Assistance: 5: Stand by assistance Dynamic Sitting Balance Dynamic Sitting - Level of Assistance: 4: Min assist Static Standing Balance Static Standing - Level of Assistance: 4: Min assist Dynamic Standing Balance Dynamic Standing - Level of Assistance: 3: Mod assist Extremity Assessment  RLE Assessment RLE Assessment: Within Functional Limits General Strength Comments: Grossly 5/5 LLE Assessment LLE Assessment: Exceptions to [facility] LLE Strength Left Hip Flexion: 3-/5 Left Knee Flexion: 3+/5 Left Knee Extension: 3+/5 Left Ankle Dorsiflexion: 4/5 Left Ankle Plantar Flexion: 4/5    Refer to Care Plan for Long Term Goals  Recommendations for other services: None   Discharge Criteria: Patient will be discharged from PT if patient refuses treatment 3 consecutive times without medical reason, if treatment goals not met, if there is a change in medical status, if patient makes no progress towards goals or if patient is discharged from hospital.  The above assessment, treatment plan, treatment alternatives and goals were discussed and mutually agreed upon: by patient  Breck Coons 01/15/2020, 7:58 AM

## 2020-01-15 NOTE — Progress Notes (Signed)
Inpatient Rehabilitation Center Individual Statement of Services  Patient Name:  Jill Rose  Date:  01/15/2020  Welcome to the Inpatient Rehabilitation Center.  Our goal is to provide you with an individualized program based on your diagnosis and situation, designed to meet your specific needs.  With this comprehensive rehabilitation program, you will be expected to participate in at least 3 hours of rehabilitation therapies Monday-Friday, with modified therapy programming on the weekends.  Your rehabilitation program will include the following services:  Physical Therapy (PT), Occupational Therapy (OT), Speech Therapy (ST), 24 hour per day rehabilitation nursing, Therapeutic Recreaction (TR), Neuropsychology, Care Coordinator, Rehabilitation Medicine, Nutrition Services, Pharmacy Services and Other  Weekly team conferences will be held on Wednesdays to discuss your progress.  Your Inpatient Rehabilitation Care Coordinator will talk with you frequently to get your input and to update you on team discussions.  Team conferences with you and your family in attendance may also be held.  Expected length of stay: 13-17 Days  Overall anticipated outcome: Min A to Supervision  Depending on your progress and recovery, your program may change. Your Inpatient Rehabilitation Care Coordinator will coordinate services and will keep you informed of any changes. Your Inpatient Rehabilitation Care Coordinator's name and contact numbers are listed  below.  The following services may also be recommended but are not provided by the Inpatient Rehabilitation Center:    Home Health Rehabiltiation Services  Outpatient Rehabilitation Services    Arrangements will be made to provide these services after discharge if needed.  Arrangements include referral to agencies that provide these services.  Your insurance has been verified to be:  [employer] Your primary doctor is:  Ralene Ok  Pertinent information  will be shared with your doctor and your insurance company.  Inpatient Rehabilitation Care Coordinator:  Lavera Guise, [place] [phone] or [phone]  Information discussed with and copy given to patient by: Andria Rhein, 01/15/2020, 11:56 AM

## 2020-01-15 NOTE — Evaluation (Signed)
Speech Language Pathology Assessment and Plan  Patient Details  Name: Jill Rose MRN: [id number] Date of Birth: 12/02/1940  SLP Diagnosis: Cognitive Impairments  Rehab Potential: Good ELOS: ~2 weeks    Today's Date: 01/15/2020 SLP Individual Time: 1100-1158 SLP Individual Time Calculation (min): 100 min   Hospital Problem: Principal Problem:   Right middle cerebral artery stroke ([place]) Active Problems:   Elevated BUN   Hyponatremia   Diabetic peripheral neuropathy ([place])  Past Medical History:  Past Medical History:  Diagnosis Date  . Diabetes mellitus without complication ([place])   . Essential hypertension   . Hypothyroidism   . TIA (transient ischemic attack) 2018   Past Surgical History: History reviewed. No pertinent surgical history.  Assessment / Plan / Recommendation Clinical Impression   HPI: Jill Rose is a 79 year old right-handed female with history of diet-controlled diabetes mellitus, hypertension, hypothyroidism, TIA maintained on aspirin. History taken from chart review and patient. Patient lives with spouse.  Two-level home with 3-4 steps to entry reportedly independent prior to admission.  She has a supportive family who plans to hire assist as needed.  She presented on 01/05/2020 with left hemiparesis. CT/MRI showed acute subacute nonhemorrhagic infarction involving the right corona radiata and right posterior temporal and occipital lobes. Remote lacunar infarct of the corona radiata bilaterally, left thalamus and left paramedian pons.  Patient did not receive TPA.  MRA with no proximal intracranial vessel occlusion.  CT angiogram of head and neck showed severe intracranial atherosclerotic disease without acute large vessel occlusion.  25% diameter stenosis proximal right ICA.  No significant left carotid stenosis.  Echocardiogram with EF of 70% without any wall motion abnormalities.  Admission chemistry sodium 132, potassium 5.3, glucose 135.  Neurology services  consulted currently maintained on aspirin 325 mg and Plavix for CVA prophylaxis x3 months then Plavix alone.  Subcutaneous Lovenox for DVT prophylaxis.  Tolerating a regular diet.  Therapy evaluations completed and patient was admitted for a comprehensive rehab program 01/14/20 and SLP evaluation was completed 01/15/20 with results as follows:  Pt presents with mild-mod cognitive impairments characterized by reduced problem solving, emergent awareness, organization, planning, and short term memory. Memory impairments include both storage and retrieval deficits. Pt reports she has also had difficulty completing familiar tasks such as working her iPad and reading comprehension. She scored 17/30 on the [facility] version 7.1 (26 or > WFL). Per chart review and pt's report, she experiences baseline mild dysarthria when fatigued; although mild articulatory imprecision noted intermittently during session, she was 100% intelligible. Expressive/receptive language in tact.    Recommend pt receive skilled ST services while inpatient in order to maximize her functional independence and safety prior to discharge.    Skilled Therapeutic Interventions          Cognitive-linguistic evaluation was administered and results were reviewed with pt. Please see above for details regarding results.    SLP Assessment  Patient will need skilled [place] [facility] during CIR admission    Recommendations  Patient destination: Home Follow up Recommendations: Home Health SLP;24 hour supervision/assistance Equipment Recommended: None recommended by SLP    SLP Frequency 3 to 5 out of 7 days   SLP Duration  SLP Intensity  SLP Treatment/Interventions ~2 weeks  Minumum of 1-2 x/day, 30 to 90 minutes  Cueing hierarchy;Cognitive remediation/compensation;Internal/external aids;Functional tasks;Patient/family education;Therapeutic Activities    Pain Pain Assessment Pain Scale: 0-10 Pain Score: 0-No pain       SLP Evaluation Cognition Overall Cognitive Status: Impaired/Different  from baseline Arousal/Alertness: Awake/alert Orientation Level: Oriented X4 Attention: Sustained Sustained Attention: Appears intact Memory: Impaired Memory Impairment: Storage deficit;Retrieval deficit;Decreased short term memory Decreased Short Term Memory: Verbal basic;Functional basic Awareness: Impaired Awareness Impairment: Emergent impairment Problem Solving: Impaired Problem Solving Impairment: Functional complex;Verbal basic Executive Function: Reasoning;Sequencing;Organizing Reasoning: Appears intact Sequencing: Appears intact Organizing: Impaired Organizing Impairment: Functional complex Safety/Judgment: Appears intact  Comprehension Auditory Comprehension Overall Auditory Comprehension: Appears within functional limits for tasks assessed Commands: Within Functional Limits Conversation: Complex Visual Recognition/Discrimination Discrimination: Within Function Limits Reading Comprehension Reading Status: Not tested Expression Expression Primary Mode of Expression: Verbal Verbal Expression Overall Verbal Expression: Appears within functional limits for tasks assessed Initiation: No impairment Non-Verbal Means of Communication: Not applicable Written Expression Written Expression: Not tested Oral Motor Oral Motor/Sensory Function Overall Oral Motor/Sensory Function: Within functional limits Motor Speech Overall Motor Speech: Appears within functional limits for tasks assessed Phonation: Normal Intelligibility: Intelligible   Intelligibility: Intelligible  Short Term Goals: Week 1: SLP Short Term Goal 1 (Week 1): Pt will demonstrate ability to problem solve basic to mildly compex functional tasks with Min A verbal and visual cues. SLP Short Term Goal 2 (Week 1): Pt will recall new and/or daily information with Mod A verbal and visual cues for use of compensatory straegies and/or  aids. SLP Short Term Goal 3 (Week 1): Pt will demonsrate ability to detect functional errors with Mod A verbal and visual cues.  Refer to Care Plan for Long Term Goals  Recommendations for other services: None   Discharge Criteria: Patient will be discharged from SLP if patient refuses treatment 3 consecutive times without medical reason, if treatment goals not met, if there is a change in medical status, if patient makes no progress towards goals or if patient is discharged from hospital.  The above assessment, treatment plan, treatment alternatives and goals were discussed and mutually agreed upon: by patient  Arbutus Leas 01/15/2020, 12:18 PM

## 2020-01-16 ENCOUNTER — Inpatient Hospital Stay (HOSPITAL_COMMUNITY): Payer: Medicare HMO | Admitting: Speech Pathology

## 2020-01-16 ENCOUNTER — Inpatient Hospital Stay (HOSPITAL_COMMUNITY): Payer: Medicare HMO | Admitting: Occupational Therapy

## 2020-01-16 ENCOUNTER — Inpatient Hospital Stay (HOSPITAL_COMMUNITY): Payer: Medicare HMO | Admitting: Physical Therapy

## 2020-01-16 DIAGNOSIS — R7309 Other abnormal glucose: Secondary | ICD-10-CM

## 2020-01-16 DIAGNOSIS — R0989 Other specified symptoms and signs involving the circulatory and respiratory systems: Secondary | ICD-10-CM

## 2020-01-16 LAB — GLUCOSE, CAPILLARY
Glucose-Capillary: 139 mg/dL — ABNORMAL HIGH (ref 70–99)
Glucose-Capillary: 173 mg/dL — ABNORMAL HIGH (ref 70–99)
Glucose-Capillary: 122 mg/dL — ABNORMAL HIGH (ref 70–99)

## 2020-01-16 NOTE — Progress Notes (Signed)
Speech Language Pathology Daily Session Note  Patient Details  Name: Jill Rose MRN: [id number] Date of Birth: Dec 04, 1940  Today's Date: 01/16/2020 SLP Individual Time: 1015-1058 SLP Individual Time Calculation (min): 43 min  Short Term Goals: Week 1: SLP Short Term Goal 1 (Week 1): Pt will demonstrate ability to problem solve basic to mildly compex functional tasks with Min A verbal and visual cues. SLP Short Term Goal 2 (Week 1): Pt will recall new and/or daily information with Mod A verbal and visual cues for use of compensatory straegies and/or aids. SLP Short Term Goal 3 (Week 1): Pt will demonsrate ability to detect functional errors with Mod A verbal and visual cues.  Skilled Therapeutic Interventions: Pt was seen for skilled ST targeting cognitive goals. Pt required overall Supervision A level verbal cues for problem solving, Min A for error awareness and recall during a basic money management task from the [employer] using coins and cash. During a basic medication management task, pt interpreted medication labels and organized a basic QID pill chart pt required Mod A to interpret medication dosages, Min A for visual scanning. Pt left sitting in chair with alarm set and needs within reach. Continue per current plan of care.      Pain Pain Assessment Pain Scale: 0-10 Pain Score: 0-No pain  Therapy/Group: Individual Therapy  Little Ishikawa 01/16/2020, 7:14 AM

## 2020-01-16 NOTE — Progress Notes (Addendum)
[facility] PHYSICAL MEDICINE & REHABILITATION PROGRESS NOTE  Subjective/Complaints: Patient seen sitting up in a chair this morning, working with therapies.  She states she did not sleep well overnight because her bed was not made.  She states she needs a bed made.  She states she had a good first day of therapy yesterday.  ROS: Denies CP, SOB, N/V/D  Objective: Vital Signs: Blood pressure (!) 129/59, pulse 78, temperature 98.3 F (36.8 C), temperature source Oral, resp. rate 16, height 5' 1.02" (1.55 m), weight 76.8 kg, SpO2 98 %. No results found. Recent Labs    01/14/20 1821 01/15/20 0253  WBC 10.7* 9.6  HGB 12.3 12.6  HCT 38.6 38.4  PLT 251 263   Recent Labs    01/14/20 1821 01/15/20 0253  NA  --  134*  K  --  5.1  CL  --  101  CO2  --  23  GLUCOSE  --  140*  BUN  --  24*  CREATININE 1.00 0.83  CALCIUM  --  9.0    Physical Exam: BP (!) 129/59 (BP Location: Right Arm)   Pulse 78   Temp 98.3 F (36.8 C) (Oral)   Resp 16   Ht 5' 1.02" (1.55 m)   Wt 76.8 kg   SpO2 98%   BMI 31.97 kg/m  Constitutional: No distress . Vital signs reviewed. HENT: Normocephalic.  Atraumatic. Eyes: EOMI. No discharge. Cardiovascular: No JVD. +Murmur. Respiratory: Normal effort.  No stridor. GI: Non-distended. Skin: Warm and dry.  Intact. Psych: Normal mood.  Normal behavior. Musc: No edema in extremities.  No tenderness in extremities. Neuro: Alert She makes eye contact with examiner.   Follows simple commands.   Appears to have a fair awareness of deficits. Motor: RUE/RLE: 5/5 proximal to distal, no ataxia RUE LUE: 4+/5 proximal to distal LLE: 4+/5 proximal to distal LUE: Mild ataxia, stable   Assessment/Plan: 1. 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  Physiatrist is providing close team supervision and 24 hour management of active medical problems listed  below.  Physiatrist and rehab team continue to assess barriers to discharge/monitor patient progress toward functional and medical goals  Care Tool:  Bathing    Body parts bathed by patient: Right arm, Left arm, Chest, Abdomen, Front perineal area, Buttocks, Right upper leg, Left upper leg, Face   Body parts bathed by helper: Right lower leg, Left lower leg     Bathing assist Assist Level: Minimal Assistance - Patient > 75%     Upper Body Dressing/Undressing Upper body dressing   What is the patient wearing?: Hospital gown only    Upper body assist Assist Level: Set up assist    Lower Body Dressing/Undressing Lower body dressing      What is the patient wearing?: Underwear/pull up, Pants     Lower body assist Assist for lower body dressing: Moderate Assistance - Patient 50 - 74%     Toileting Toileting    Toileting assist Assist for toileting: Minimal Assistance - Patient > 75%     Transfers Chair/bed transfer  Transfers assist     Chair/bed transfer assist level: Minimal Assistance - Patient > 75%     Locomotion Ambulation   Ambulation assist      Assist level: Minimal Assistance - Patient > 75% Assistive device: Walker-rolling Max distance: 40'   Walk 10 feet activity   Assist  Assist level: Minimal Assistance - Patient > 75% Assistive device: Walker-rolling   Walk 50 feet activity   Assist Walk 50 feet with 2 turns activity did not occur: Safety/medical concerns         Walk 150 feet activity   Assist Walk 150 feet activity did not occur: Safety/medical concerns         Walk 10 feet on uneven surface  activity   Assist Walk 10 feet on uneven surfaces activity did not occur: Safety/medical concerns         Wheelchair     Assist Will patient use wheelchair at discharge?: No             Wheelchair 50 feet with 2 turns activity    Assist            Wheelchair 150 feet activity     Assist             Medical Problem List and Plan: 1.  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  Antithrombotics: -DVT/anticoagulation: Lovenox             -antiplatelet therapy: Aspirin 325 mg and Plavix 75 mg daily x3 months then Plavix alone 3. Pain Management: Tylenol as needed 4. Mood: Provide emotional support             -antipsychotic agents: N/A 5. Neuropsych: This patient is capable of making decisions on her own behalf. 6. Skin/Wound Care: Routine skin checks 7. Fluids/Electrolytes/Nutrition: Routine in and outs.  8.  Hypertension.  Cozaar 50 mg nightly, Norvasc 10 mg daily.    Labile on 7/2  Monitor with increased mobility 9.  Diabetes mellitus with peripheral neuropathy.  Hemoglobin A1c 7.6.  SSI.  Labile on 7/2  Monitor with increased mobility 10.  Hypothyroidism.  Synthroid 11.  Hyperlipidemia.  Lipitor 12. Obesity: Encourage weight loss 13.  Hyponatremia  Sodium 134 on 7/1, labs ordered for Monday  Continue to monitor 14.  Elevated BUN  Encourage fluids, labs ordered for Monday  LOS: 2 days A FACE TO FACE EVALUATION WAS PERFORMED  Jarren Para Karis Juba 01/16/2020, 8:29 AM

## 2020-01-16 NOTE — Progress Notes (Signed)
Occupational Therapy Session Note  Patient Details  Name: Jill Rose MRN: [id number] Date of Birth: [birth year]  Today's Date: 01/16/2020 OT Individual Time: 1100-1132 OT Individual Time Calculation (min): 32 min    Short Term Goals: Week 1:  OT Short Term Goal 1 (Week 1): Pt will be able to don pants over feet with min A. OT Short Term Goal 2 (Week 1): Pt will be able to pull pants over hips with CGA. OT Short Term Goal 3 (Week 1): Pt will complete toilet transfer with min A. OT Short Term Goal 4 (Week 1): Pt will be able to write her name with R hand with fair legibility.  Skilled Therapeutic Interventions/Progress Updates:    Pt sitting up in recliner upon OT arrival, no complaints of pain, agreeable to working with OT.  Pt reports right hand feels numb sometimes and she noticed having trouble signing her name.  Assessed bilateral hand sensation to light touch : intact all digits; sharp/dull: intact all digits; Assessed visual tracking and saccades; noting mild delay and impairment in all quadrants.  Pt instructed through various handwriting tasks to assess visual field and fine motor control.  Pt able to draw clock with completion, however mild spacing irregularity of number placement.  Pt noted with decreased fluidity signing name using right dominant hand. Pt instructed in copying paragraph placed on left side to pad of paper using right dominant hand.  No difficulty noted. Instructed pt through 9 hole peg test: Right hand 24 seconds; Left hand: 37 seconds.  Pt instructed through cursive pre-requisite exercise to facilitate improved precision, fluidity, and spacing.  Instructed pt to complete 2-3 times per day x 10 minutes.  Call bell in reach, seat alarm on.  Mild fine motor deficits and impaired visual tracking noted.   Therapy Documentation Precautions:  Precautions Precautions: Fall Restrictions Weight Bearing Restrictions: No   Therapy/Group: Individual Therapy  Amie Critchley 01/16/2020, 12:53 PM

## 2020-01-16 NOTE — Progress Notes (Signed)
Physical Therapy Session Note  Patient Details  Name: Jill Rose MRN: [id number] Date of Birth: 11-14-1940  Today's Date: 01/16/2020 PT Individual Time: 0802-0859 PT Individual Time Calculation (min): 57 min   Short Term Goals: Week 1:  PT Short Term Goal 1 (Week 1): Pt will perform bed mobility with supervision PT Short Term Goal 2 (Week 1): Pt will perform bed to chair with CGA and LRAD PT Short Term Goal 3 (Week 1): Pt will ambulate 100' with CGA and LRAD PT Short Term Goal 4 (Week 1): Pt will complete 4 steps with BHRs and CGA.  Skilled Therapeutic Interventions/Progress Updates:     Patient received in [facility] with NT present, pleasant and willing to participate in therapy. Able to don new shirt with MinA, and pants also with MinA however needed totalA for shoe management. Able to complete functional tranfers with min guard/RW during session with extended time due to short, shuffling gait pattern, and did need heavy cues for safety and hand placement with all transfers. Tolerated riding Nustep for 5 minutes on resistance 1 with BLEs only, BLEs very fatigued after this activity. Able to gait train  82fx2 with RW and min guard but heavy cues to improve upright posture and step length, easily fatigued this morning. Otherwise practiced balance strategies including standing with feet shoulder width apart/no UE support as well as narrow BOS with no UE support and min guard-MinA. Left up in recliner with all needs met, chair alarm active and RN present and attending.   Therapy Documentation Precautions:  Precautions Precautions: Fall Restrictions Weight Bearing Restrictions: No Pain: Pain Assessment Pain Scale: 0-10 Pain Score: 0-No pain    Therapy/Group: Individual Therapy   KWindell Norfolk DPT, PN1   Supplemental Physical Therapist [email or web address]   Pager [phone] Rehab Office [phone]   01/16/2020, 12:07 PM

## 2020-01-16 NOTE — Progress Notes (Signed)
Occupational Therapy Session Note  Patient Details  Name: Jill Rose MRN: [id number] Date of Birth: 08-31-1940  Today's Date: 01/16/2020 OT Individual Time: 3734-2876 OT Individual Time Calculation (min): 73 min   Short Term Goals: Week 1:  OT Short Term Goal 1 (Week 1): Pt will be able to don pants over feet with min A. OT Short Term Goal 2 (Week 1): Pt will be able to pull pants over hips with CGA. OT Short Term Goal 3 (Week 1): Pt will complete toilet transfer with min A. OT Short Term Goal 4 (Week 1): Pt will be able to write her name with R hand with fair legibility.  Skilled Therapeutic Interventions/Progress Updates:    Pt greeted in the bathroom with NT, transferring to the toilet. OT provided CGA while sitting to complete perihygiene, Min A for clothing mgt and stand pivot back to w/c using grab bar for support. After handwashing, pt requested for LEs to be stretched due to discomfort. Assisted pt with bilateral hamstring and dorsiflexion stretches, also hip stretching using figure 4 position. AROM knee and ankle circles to increase flexibility, pt reports having arthritic pain in joints and doing multiple stretches PTA to manage that pain. She declined showering today, so escorted pt to the gift shop. Instructed pt with verbal, demonstrational, and HOH cuing on self propulsion technique using w/c to work on [employer] UE strengthening/coordination. Mod-Max A for turning, vcs and assist for avoiding obstacles on Lt side as well. Pt had a tough time with symmetrical UE movement. She was then escorted back to the room. Min A for stand pivot<bed using RW. Min A for transition to supine where pt scooted hips with bridge technique given min facilitation, able to use both UEs to pull herself up using headboard with bed in trendelenburg position. Left her with all needs within reach and bed alarm set.   Therapy Documentation Precautions:  Precautions Precautions: Fall Restrictions Weight Bearing  Restrictions: No Pain: in Lt knee, completed gentle AROM/stretches stated above to address Pain Assessment Pain Scale: 0-10 Pain Score: 0-No pain ADL: ADL Eating: Set up Grooming: Setup Where Assessed-Grooming: Sitting at sink Upper Body Bathing: Setup Where Assessed-Upper Body Bathing: Sitting at sink Lower Body Bathing: Moderate assistance Where Assessed-Lower Body Bathing: Sitting at sink, Standing at sink Upper Body Dressing: Supervision/safety Where Assessed-Upper Body Dressing: Sitting at sink Lower Body Dressing: Moderate assistance Where Assessed-Lower Body Dressing: Sitting at sink, Standing at sink Toileting: Minimal assistance Where Assessed-Toileting: Teacher, adult education: Moderate assistance Toilet Transfer Method: Stand pivot      Therapy/Group: Individual Therapy  Phyllip Claw A Higinio Grow 01/16/2020, 3:42 PM

## 2020-01-17 ENCOUNTER — Inpatient Hospital Stay (HOSPITAL_COMMUNITY): Payer: Medicare HMO | Admitting: Occupational Therapy

## 2020-01-17 ENCOUNTER — Inpatient Hospital Stay (HOSPITAL_COMMUNITY): Payer: Medicare HMO | Admitting: Physical Therapy

## 2020-01-17 ENCOUNTER — Inpatient Hospital Stay (HOSPITAL_COMMUNITY): Payer: Medicare HMO | Admitting: Speech Pathology

## 2020-01-17 LAB — GLUCOSE, CAPILLARY: Glucose-Capillary: 135 mg/dL — ABNORMAL HIGH (ref 70–99)

## 2020-01-17 NOTE — Progress Notes (Signed)
[place] PHYSICAL MEDICINE & REHABILITATION PROGRESS NOTE  Subjective/Complaints:  No issues overnite, has 2 visitors (both sisters) one is no longer coming in , would like her vaccinated husband to substitute   ROS: Denies CP, SOB, N/V/D  Objective: Vital Signs: Blood pressure (!) 154/66, pulse 81, temperature 97.8 F (36.6 C), temperature source Oral, resp. rate 18, height 5' 1.02" (1.55 m), weight 76.8 kg, SpO2 100 %. No results found. Recent Labs    01/14/20 1821 01/15/20 0253  WBC 10.7* 9.6  HGB 12.3 12.6  HCT 38.6 38.4  PLT 251 263   Recent Labs    01/14/20 1821 01/15/20 0253  NA  --  134*  K  --  5.1  CL  --  101  CO2  --  23  GLUCOSE  --  140*  BUN  --  24*  CREATININE 1.00 0.83  CALCIUM  --  9.0    Physical Exam: BP (!) 154/66 (BP Location: Right Arm)   Pulse 81   Temp 97.8 F (36.6 C) (Oral)   Resp 18   Ht 5' 1.02" (1.55 m)   Wt 76.8 kg   SpO2 100%   BMI 31.97 kg/m   General: No acute distress Mood and affect are appropriate Heart: Regular rate and rhythm no rubs murmurs or extra sounds Lungs: Clear to auscultation, breathing unlabored, no rales or wheezes Abdomen: Positive bowel sounds, soft nontender to palpation, nondistended Extremities: No clubbing, cyanosis, or edema Skin: No evidence of breakdown, no evidence of rash  She makes eye contact with examiner.   Follows simple commands.   Appears to have a fair awareness of deficits. Motor: RUE/RLE: 5/5 proximal to distal, no ataxia RUE LUE: 4+/5 proximal to distal LLE: 4+/5 proximal to distal LUE: Mild ataxia, stable   Assessment/Plan: 1. 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  Physiatrist is providing close team supervision and 24 hour management of active medical problems listed below.  Physiatrist and rehab team continue to assess barriers to discharge/monitor  patient progress toward functional and medical goals  Care Tool:  Bathing    Body parts bathed by patient: Right arm, Left arm, Chest, Abdomen, Front perineal area, Buttocks, Right upper leg, Left upper leg, Face   Body parts bathed by helper: Right lower leg, Left lower leg     Bathing assist Assist Level: Minimal Assistance - Patient > 75%     Upper Body Dressing/Undressing Upper body dressing   What is the patient wearing?: Pull over shirt    Upper body assist Assist Level: Set up assist    Lower Body Dressing/Undressing Lower body dressing      What is the patient wearing?: Underwear/pull up, Pants     Lower body assist Assist for lower body dressing: Moderate Assistance - Patient 50 - 74%     Toileting Toileting    Toileting assist Assist for toileting: Moderate Assistance - Patient 50 - 74%     Transfers Chair/bed transfer  Transfers assist     Chair/bed transfer assist level: Moderate Assistance - Patient 50 - 74%     Locomotion Ambulation   Ambulation assist      Assist level: Contact Guard/Touching assist Assistive device: Walker-rolling Max distance: 85ft   Walk 10 feet activity   Assist     Assist level: Contact Guard/Touching assist Assistive device: Walker-rolling   Walk 50 feet activity   Assist  Walk 50 feet with 2 turns activity did not occur: Safety/medical concerns         Walk 150 feet activity   Assist Walk 150 feet activity did not occur: Safety/medical concerns         Walk 10 feet on uneven surface  activity   Assist Walk 10 feet on uneven surfaces activity did not occur: Safety/medical concerns         Wheelchair     Assist Will patient use wheelchair at discharge?: No             Wheelchair 50 feet with 2 turns activity    Assist            Wheelchair 150 feet activity     Assist            Medical Problem List and Plan: 1.  Left side hemiparesis secondary to  right MCA infarction is felt to be thromboembolic secondary to small and large vessel disease  Continue therapies Husband may visit  2.  Antithrombotics: -DVT/anticoagulation: Lovenox             -antiplatelet therapy: Aspirin 325 mg and Plavix 75 mg daily x3 months then Plavix alone 3. Pain Management: Tylenol as needed 4. Mood: Provide emotional support             -antipsychotic agents: N/A 5. Neuropsych: This patient is capable of making decisions on her own behalf. 6. Skin/Wound Care: Routine skin checks 7. Fluids/Electrolytes/Nutrition: Routine in and outs.  8.  Hypertension.  Cozaar 50 mg nightly, Norvasc 10 mg daily.     Vitals:   01/16/20 1946 01/17/20 0603  BP: (!) 149/84 (!) 154/66  Pulse: 89 81  Resp: 16 18  Temp: 97.8 F (36.6 C) 97.8 F (36.6 C)  SpO2: 100% 100%    Monitor with increased mobility 9.  Diabetes mellitus with peripheral neuropathy.  Hemoglobin A1c 7.6.  SSI. CBG (last 3)  Recent Labs    01/16/20 1137 01/16/20 2108 01/17/20 0606  GLUCAP 173* 122* 135*  fair control 7/3   Monitor with increased mobility 10.  Hypothyroidism.  Synthroid 11.  Hyperlipidemia.  Lipitor 12. Obesity: Encourage weight loss 13.  Hyponatremia  Sodium 134 on 7/1, labs ordered for Monday  Continue to monitor 14.  Elevated BUN  Encourage fluids, labs ordered for Monday  LOS: 3 days A FACE TO FACE EVALUATION WAS PERFORMED  Erick Colace 01/17/2020, 6:50 AM

## 2020-01-17 NOTE — Progress Notes (Signed)
Occupational Therapy Session Note  Patient Details  Name: Jill Rose MRN: [id number] Date of Birth: 22-Sep-1940  Today's Date: 01/17/2020 OT Individual Time: 1100-1158  &  1500-1530 OT Individual Time Calculation (min): 58 min  &  30 min   Short Term Goals: Week 1:  OT Short Term Goal 1 (Week 1): Pt will be able to don pants over feet with min A. OT Short Term Goal 2 (Week 1): Pt will be able to pull pants over hips with CGA. OT Short Term Goal 3 (Week 1): Pt will complete toilet transfer with min A. OT Short Term Goal 4 (Week 1): Pt will be able to write her name with R hand with fair legibility.  Skilled Therapeutic Interventions/Progress Updates:    AM session:   Patient seated in recliner, denies pain.  She is pleasant, cooperative and talkative t/o session.  Sit to stand and ambulation with RW to/from recliner, toilet, sink and w/c with CG/minA - cues for step length and positioning of walker.  toileting completed with min A for pants up.  Hand hygiene in stance at sink with CGa and cues.    Completed dynavision activity to challenge visual scanning, standing balance and dynamic reach with bilateral UEs - in stance, 2 minutes, whole screen - trial 1 ( bilateral UEs) = 4.44 sec, trial 2 ( R UE) = 3.53 sec, trail 3 (L UE) = 3.43 sec - slight increase in time needed to locate indicators on left side Completed pipe tree design copy task - trial 1 with min cues, trial 2 no cues needed (using bilateral UEs without cues) Short distance ambulation back to recliner with CGA.  She remained seated in recliner at close of session, seat alarm set and call bell in hand.     PM session:   Patient seated in recliner, states that she is tired this afternoon, denies pain.  Sit to stand and ambulation with RW to/from recliner, toilet, sink and back to recliner with CG/minA - tactile and verbal cues for step length and positioning of walker.  toileting completed with min A for pants up.  Hand hygiene in  stance at sink with CGA.   Patient with questions related to diet - relayed to nursing.  Reviewed, provided theraputty and practiced with hand dexterity and strengthening activity.  Assisted with set up of magic cup at close of session - she is able to manage with min difficulty.  Seat alarm set and call bell in reach.        Therapy Documentation Precautions:  Precautions Precautions: Fall Restrictions Weight Bearing Restrictions: No  Therapy/Group: Individual Therapy  Barrie Lyme 01/17/2020, 7:42 AM

## 2020-01-17 NOTE — Progress Notes (Signed)
Verbal permission given by Dr. Larna Daughters for pts husband to be added to pts visitor list. Changes made In epic.

## 2020-01-17 NOTE — Progress Notes (Signed)
Physical Therapy Session Note  Patient Details  Name: Jill Rose MRN: [id number] Date of Birth: [birth year]  Today's Date: 01/17/2020 PT Individual Time: 0800-0908 PT Individual Time Calculation (min): 68 min   Short Term Goals: Week 1:  PT Short Term Goal 1 (Week 1): Pt will perform bed mobility with supervision PT Short Term Goal 2 (Week 1): Pt will perform bed to chair with CGA and LRAD PT Short Term Goal 3 (Week 1): Pt will ambulate 100' with CGA and LRAD PT Short Term Goal 4 (Week 1): Pt will complete 4 steps with BHRs and CGA.  Skilled Therapeutic Interventions/Progress Updates:   Pt received sitting EOB and agreeable to PT. Stand pivot transfer to [facility] with RW and CGA and cues for improved step height/width on the L. Sit<>stand transfers throughout session with min-CGA from PT for improved anterior weight shift and proper UE placement.    PT provided pt in pediatric RW  Due to pt height. Gait training with RW 2x 48f with min CGA. Noted improvement in posture and staying within RW compared to gait with standard height RW. Cues for step length throughout with intermittent improvement. Dynanic gait training in parallel bars forward/reverse and side stepping 2 x 8 Bil with min assist throughout for safety and facilitate glute medius on the L throughout.   trunkal NMR to perform BUE flexion to raise ball to target 2 x 10. Lateral Rotation to tap ball and handle of RW 2 x 10. Tactile cues for trunkal symmetry and erect posture.   Reciprocal foot taps on 4 inch step for force improve weight shift and HS/hip flexion activation on the L 2 x 9 BLE with tactile cues for improved posture and cues to maintain wide BOS.   Patient returned to room and performed stand pivot to recliner with min assist and RW, cues for step width on the L. Pt left sitting in recliner with call bell in reach and all needs met.            Therapy Documentation Precautions:  Precautions Precautions:  Fall Restrictions Weight Bearing Restrictions: No   Vital Signs: Therapy Vitals Temp: 97.8 F (36.6 C) Temp Source: Oral Pulse Rate: 81 Resp: 18 BP: (!) 154/66 Patient Position (if appropriate): Lying Oxygen Therapy SpO2: 100 % O2 Device: Room Air Pain: Pain Assessment Pain Scale: 0-10 Pain Score: 0-No pain    Therapy/Group: Individual Therapy  ALorie Phenix7/09/2019, 9:10 AM

## 2020-01-17 NOTE — Plan of Care (Signed)
  Problem: Consults Goal: RH STROKE PATIENT EDUCATION Description: See Patient Education module for education specifics  Outcome: Progressing Goal: Diabetes Guidelines if Diabetic/Glucose > 140 Description: If diabetic or lab glucose is > 140 mg/dl - Initiate Diabetes/Hyperglycemia Guidelines & Document Interventions  Outcome: Progressing   Problem: RH BLADDER ELIMINATION Goal: RH STG MANAGE BLADDER WITH ASSISTANCE Description: STG Manage Bladder With supervision Assistance Outcome: Progressing   Problem: RH SKIN INTEGRITY Goal: RH STG SKIN FREE OF INFECTION/BREAKDOWN Description: Pt will be free of skin breakdown/infection prior to DC with supervision assist Outcome: Progressing Goal: RH STG MAINTAIN SKIN INTEGRITY WITH ASSISTANCE Description: STG Maintain Skin Integrity With supervision Assistance. Outcome: Progressing   Problem: RH SAFETY Goal: RH STG ADHERE TO SAFETY PRECAUTIONS W/ASSISTANCE/DEVICE Description: STG Adhere to Safety Precautions With cues/reminders Assistance/Device. Outcome: Progressing   Problem: RH KNOWLEDGE DEFICIT Goal: RH STG INCREASE KNOWLEDGE OF DIABETES Description: Pt/family will be able to demonstrate knowledge of DM management with medications and diet precautions and ways to monitor for hypo/hyperglycemia with cues/reminders assist using handouts/booklets provided by staff Outcome: Progressing Goal: RH STG INCREASE KNOWLEDGE OF HYPERTENSION Description: Pt/family will be able to demonstrate knowledge of HTN management with medications and diet precautions and ways to monitor blood pressure with cues/reminders assist using handouts/booklets provided by staff Outcome: Progressing Goal: RH STG INCREASE KNOWLEGDE OF HYPERLIPIDEMIA Description: Pt/family will be able to demonstrate knowledge of HLD management with medications and diet precautions with cues/reminders assist using handouts/booklets provided by staff Outcome: Progressing Goal: RH STG  INCREASE KNOWLEDGE OF STROKE PROPHYLAXIS Description: Pt/family will be able to demonstrate knowledge of stroke prevention with medications and diet precautions and ways to monitor signs/symptoms of stroke with cues/reminders assist using handouts/booklets provided by staff Outcome: Progressing   

## 2020-01-17 NOTE — Progress Notes (Signed)
Speech Language Pathology Daily Session Note  Patient Details  Name: Jill Rose MRN: [id number] Date of Birth: Feb 22, 1941  Today's Date: 01/17/2020 SLP Individual Time: 0908-1000 SLP Individual Time Calculation (min): 52 min  Short Term Goals: Week 1: SLP Short Term Goal 1 (Week 1): Pt will demonstrate ability to problem solve basic to mildly compex functional tasks with Min A verbal and visual cues. SLP Short Term Goal 2 (Week 1): Pt will recall new and/or daily information with Mod A verbal and visual cues for use of compensatory straegies and/or aids. SLP Short Term Goal 3 (Week 1): Pt will demonsrate ability to detect functional errors with Mod A verbal and visual cues.  Skilled Therapeutic Interventions: Pt was seen for skilled ST targeting cognitive goals.  SLP facilitated the session with a deductive reasoning puzzle to address goals for problem solving and error awareness.  Pt required max assist to complete task due to decreased task organization and working memory even after therapist demonstrated organizational strategies such as crossing items off as they are completed and underlining key information.  Pt reported that she felt "nervous" which made her struggle to complete task.  SLP provided encouragement and reassurance as well as skilled education regarding rationale for ST interventions in maximize functional independence.  Pt was left in recliner with chair alarm set and call bell within reach.  Continue per current plan of care.      Pain Pain Assessment Pain Scale: 0-10 Pain Score: 0-No pain  Therapy/Group: Individual Therapy  Jill Rose, Melanee Spry 01/17/2020, 12:26 PM

## 2020-01-18 LAB — GLUCOSE, CAPILLARY
Glucose-Capillary: 190 mg/dL — ABNORMAL HIGH (ref 70–99)
Glucose-Capillary: 180 mg/dL — ABNORMAL HIGH (ref 70–99)

## 2020-01-18 MED ORDER — LOSARTAN POTASSIUM 50 MG PO TABS
100.0000 mg | ORAL_TABLET | Freq: Every day | ORAL | Status: DC
  Administered 2020-01-18 – 2020-01-28 (×11): 100 mg via ORAL
  Filled 2020-01-18 (×11): qty 2

## 2020-01-18 NOTE — Progress Notes (Signed)
BGM 144.

## 2020-01-18 NOTE — Progress Notes (Addendum)
[place] PHYSICAL MEDICINE & REHABILITATION PROGRESS NOTE  Subjective/Complaints:  No issues overnite discussed BP meds   ROS: Denies CP, SOB, N/V/D  Objective: Vital Signs: Blood pressure (!) 161/73, pulse 97, temperature 97.9 F (36.6 C), resp. rate 20, height 5' 1.02" (1.55 m), weight 76.8 kg, SpO2 100 %. No results found. No results for input(s): WBC, HGB, HCT, PLT in the last 72 hours. No results for input(s): NA, K, CL, CO2, GLUCOSE, BUN, CREATININE, CALCIUM in the last 72 hours.  Physical Exam: BP (!) 161/73 (BP Location: Left Arm)   Pulse 97   Temp 97.9 F (36.6 C)   Resp 20   Ht 5' 1.02" (1.55 m)   Wt 76.8 kg   SpO2 100%   BMI 31.97 kg/m    General: No acute distress Mood and affect are appropriate Heart: Regular rate and rhythm no rubs murmurs or extra sounds Lungs: Clear to auscultation, breathing unlabored, no rales or wheezes Abdomen: Positive bowel sounds, soft nontender to palpation, nondistended Extremities: No clubbing, cyanosis, or edema Skin: No evidence of breakdown, no evidence of rash  Musculoskeletal: Full range of motion in all 4 extremities. No joint swelling  She makes eye contact with examiner.   Follows simple commands.   Appears to have a fair awareness of deficits. Motor: RUE/RLE: 5/5 proximal to distal, no ataxia RUE LUE: 4+/5 proximal to distal LLE: 4+/5 proximal to distal LUE: Mild ataxia, stable   Assessment/Plan: 1. 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  Physiatrist is providing close team supervision and 24 hour management of active medical problems listed below.  Physiatrist and rehab team continue to assess barriers to discharge/monitor patient progress toward functional and medical goals  Care Tool:  Bathing    Body parts bathed by patient: Face, Chest, Abdomen   Body parts bathed by helper: Front  perineal area     Bathing assist Assist Level: Minimal Assistance - Patient > 75%     Upper Body Dressing/Undressing Upper body dressing   What is the patient wearing?: Pull over shirt    Upper body assist Assist Level: Set up assist    Lower Body Dressing/Undressing Lower body dressing      What is the patient wearing?: Underwear/pull up     Lower body assist Assist for lower body dressing: Moderate Assistance - Patient 50 - 74%     Toileting Toileting    Toileting assist Assist for toileting: Moderate Assistance - Patient 50 - 74%     Transfers Chair/bed transfer  Transfers assist     Chair/bed transfer assist level: Moderate Assistance - Patient 50 - 74%     Locomotion Ambulation   Ambulation assist      Assist level: Contact Guard/Touching assist Assistive device: Walker-rolling Max distance: 27ft   Walk 10 feet activity   Assist     Assist level: Contact Guard/Touching assist Assistive device: Walker-rolling   Walk 50 feet activity   Assist Walk 50 feet with 2 turns activity did not occur: Safety/medical concerns         Walk 150 feet activity   Assist Walk 150 feet activity did not occur: Safety/medical concerns         Walk 10 feet on uneven surface  activity   Assist Walk 10 feet on uneven surfaces activity did not occur: Safety/medical concerns         Wheelchair  Assist Will patient use wheelchair at discharge?: No             Wheelchair 50 feet with 2 turns activity    Assist            Wheelchair 150 feet activity     Assist            Medical Problem List and Plan: 1.  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  Antithrombotics: -DVT/anticoagulation: Lovenox             -antiplatelet therapy: Aspirin 325 mg and Plavix 75 mg daily x3 months then  Plavix alone 3. Pain Management: Tylenol as needed 4. Mood: Provide emotional support             -antipsychotic agents: N/A 5. Neuropsych: This patient is capable of making decisions on her own behalf. 6. Skin/Wound Care: Routine skin checks 7. Fluids/Electrolytes/Nutrition: Routine in and outs.  8.  Hypertension.  Cozaar 50 mg nightly, Norvasc 10 mg daily.     Vitals:   01/17/20 1505 01/17/20 1929  BP: (!) 179/78 (!) 161/73  Pulse: 93 97  Resp: 18 20  Temp: 97.7 F (36.5 C) 97.9 F (36.6 C)  SpO2: 99% 100%   Still elevated increase Cozaar to 100mg   9.  Diabetes mellitus with peripheral neuropathy.  Hemoglobin A1c 7.6.  SSI. CBG (last 3)  Recent Labs    01/16/20 1137 01/16/20 2108 01/17/20 0606  GLUCAP 173* 122* 135*  fair control 7/4   Monitor with increased mobility 10.  Hypothyroidism.  Synthroid 11.  Hyperlipidemia.  Lipitor 12. Obesity: Encourage weight loss 13.  Hyponatremia  Sodium 134 on 7/1, labs ordered for Monday  Continue to monitor 14.  Elevated BUN  Encourage fluids, labs ordered for Monday  LOS: 4 days A FACE TO FACE EVALUATION WAS PERFORMED  Sunday 01/18/2020, 6:28 AM

## 2020-01-19 ENCOUNTER — Inpatient Hospital Stay (HOSPITAL_COMMUNITY): Payer: Medicare HMO | Admitting: Physical Therapy

## 2020-01-19 ENCOUNTER — Inpatient Hospital Stay (HOSPITAL_COMMUNITY): Payer: Medicare HMO

## 2020-01-19 LAB — COMPREHENSIVE METABOLIC PANEL
ALT: 17 U/L (ref 0–44)
AST: 20 U/L (ref 15–41)
Albumin: 3.1 g/dL — ABNORMAL LOW (ref 3.5–5.0)
Alkaline Phosphatase: 49 U/L (ref 38–126)
Anion gap: 10 (ref 5–15)
BUN: 18 mg/dL (ref 8–23)
CO2: 24 mmol/L (ref 22–32)
Calcium: 8.9 mg/dL (ref 8.9–10.3)
Chloride: 103 mmol/L (ref 98–111)
Creatinine, Ser: 0.84 mg/dL (ref 0.44–1.00)
GFR calc Af Amer: >60 mL/min (ref >60)
GFR calc non Af Amer: >60 mL/min (ref >60)
Glucose, Bld: 174 mg/dL — ABNORMAL HIGH (ref 70–99)
Potassium: 4.5 mmol/L (ref 3.5–5.1)
Sodium: 137 mmol/L (ref 135–145)
Total Bilirubin: 0.7 mg/dL (ref 0.3–1.2)
Total Protein: 7.3 g/dL (ref 6.5–8.1)

## 2020-01-19 LAB — GLUCOSE, CAPILLARY
Glucose-Capillary: 204 mg/dL — ABNORMAL HIGH (ref 70–99)
Glucose-Capillary: 192 mg/dL — ABNORMAL HIGH (ref 70–99)
Glucose-Capillary: 184 mg/dL — ABNORMAL HIGH (ref 70–99)
Glucose-Capillary: 144 mg/dL — ABNORMAL HIGH (ref 70–99)
Glucose-Capillary: 199 mg/dL — ABNORMAL HIGH (ref 70–99)
Glucose-Capillary: 119 mg/dL — ABNORMAL HIGH (ref 70–99)
Glucose-Capillary: 163 mg/dL — ABNORMAL HIGH (ref 70–99)
Glucose-Capillary: 139 mg/dL — ABNORMAL HIGH (ref 70–99)
Glucose-Capillary: 156 mg/dL — ABNORMAL HIGH (ref 70–99)

## 2020-01-19 MED ORDER — METFORMIN HCL 500 MG PO TABS
500.0000 mg | ORAL_TABLET | Freq: Every day | ORAL | Status: DC
  Administered 2020-01-19 – 2020-01-20 (×2): 500 mg via ORAL
  Filled 2020-01-19 (×2): qty 1

## 2020-01-19 NOTE — Progress Notes (Signed)
Speech Language Pathology Daily Session Note  Patient Details  Name: Jill Rose MRN: [id number] Date of Birth: [birth year]  Today's Date: 01/19/2020 SLP Individual Time: 1403-1500 SLP Individual Time Calculation (min): 57 min  Short Term Goals: Week 1: SLP Short Term Goal 1 (Week 1): Pt will demonstrate ability to problem solve basic to mildly compex functional tasks with Min A verbal and visual cues. SLP Short Term Goal 2 (Week 1): Pt will recall new and/or daily information with Mod A verbal and visual cues for use of compensatory straegies and/or aids. SLP Short Term Goal 3 (Week 1): Pt will demonsrate ability to detect functional errors with Mod A verbal and visual cues.  Skilled Therapeutic Interventions: Skilled ST services focused on cognitive skills. SLP facilitated semi-complex problem solving, error awareness and recall skills utilizing BIQ pill organizer task, pt required x2 verbal cues for error awareness (placing pill in AM verse PM and removing incorrect pill.) Pt demonstrated recall of medication name/function/times per day with visual aid mod I. Pt demonstrated delayed recall of medication names without list given mod A verbal cues. SLP instructed pt in word finding strategies and utilized them in communication barrier task, pt required mod A fade to min A verbal cues. Pt was left in room with call bell within reach and chair alarm set. ST recommends to continue skilled ST services.      Pain Pain Assessment Pain Scale: 0-10 Pain Score: 0-No pain  Therapy/Group: Individual Therapy  [name]  [facility] 01/19/2020, 3:18 PM

## 2020-01-19 NOTE — Progress Notes (Signed)
Occupational Therapy Session Note  Patient Details  Name: Jill Rose MRN: [id number] Date of Birth: [birth year]  Today's Date: 01/19/2020 OT Individual Time: 2947-6546 OT Individual Time Calculation (min): 54 min    Short Term Goals: Week 1:  OT Short Term Goal 1 (Week 1): Pt will be able to don pants over feet with min A. OT Short Term Goal 2 (Week 1): Pt will be able to pull pants over hips with CGA. OT Short Term Goal 3 (Week 1): Pt will complete toilet transfer with min A. OT Short Term Goal 4 (Week 1): Pt will be able to write her name with R hand with fair legibility.  Skilled Therapeutic Interventions/Progress Updates:    1:1. Pt received in recliner agreeable to OT requesting to toilet, groom and work on getting in/out of bed. Pt completes functional mobility with RW into/out of bathroom with CGA for mobility and MIN A for advancing LLE for larger step through length especially around turns. Pt able to manage pants past hips prior to toileting and wipe in sitting however needs MOD A to manage up hips after bladder void. Pt completes grooming/hand hygiene seated at sink for energy conservation. Pt escorted to tx gym and transfers onto mat table similar to stated above with RW. Pt completes 3x sit<>supine with max decreasing to MIN A overall with VC for compensatory strategies d/t L hip weakness tucking RLE under L to bring up and assuming hook lying to roll to R and press up to sitting. exited session with pt seated in recliner, exi talarm on and call light in reach  Therapy Documentation Precautions:  Precautions Precautions: Fall Restrictions Weight Bearing Restrictions: No General:   Vital Signs:  Pain: Pain Assessment Pain Scale: 0-10 Pain Score: 0-No pain ADL: ADL Eating: Set up Grooming: Setup Where Assessed-Grooming: Sitting at sink Upper Body Bathing: Setup Where Assessed-Upper Body Bathing: Sitting at sink Lower Body Bathing: Moderate assistance Where  Assessed-Lower Body Bathing: Sitting at sink, Standing at sink Upper Body Dressing: Supervision/safety Where Assessed-Upper Body Dressing: Sitting at sink Lower Body Dressing: Moderate assistance Where Assessed-Lower Body Dressing: Sitting at sink, Standing at sink Toileting: Minimal assistance Where Assessed-Toileting: Teacher, adult education: Moderate assistance Toilet Transfer Method: Stand pivot Vision   Perception    Praxis   Exercises:   Other Treatments:     Therapy/Group: Individual Therapy  Shon Hale 01/19/2020, 11:16 AM

## 2020-01-19 NOTE — Progress Notes (Signed)
Physical Therapy Session Note  Patient Details  Name: Jill Rose MRN: [id number] Date of Birth: 07-25-1940  Today's Date: 01/19/2020 PT Individual Time: 0800-0856 PT Individual Time Calculation (min): 56 min   Short Term Goals: Week 1:  PT Short Term Goal 1 (Week 1): Pt will perform bed mobility with supervision PT Short Term Goal 2 (Week 1): Pt will perform bed to chair with CGA and LRAD PT Short Term Goal 3 (Week 1): Pt will ambulate 100' with CGA and LRAD PT Short Term Goal 4 (Week 1): Pt will complete 4 steps with BHRs and CGA.  Skilled Therapeutic Interventions/Progress Updates:    Patient received in bed, excited to participate in therapy today; able to get to EOB with S and use of bed features, then able to [facility] hospital gown and don clothing with S to min guard today. Tolerated significant increase in gait distance up to 37f with RW and Mod cues for step length and BOS, but fatigued at end of gait distance; also able to progress stair training today and navigated 8 four inch steps with B rails, min guard, and Min cues for appropriate technique. Otherwise worked on functional tasks such as forward toe taps in standing to static targets to improve B step length as well as alternating cone taps to improve foot clearance and reciprocal pattern with gait. Left up in recliner with all needs met, alarm active this morning.   Therapy Documentation Precautions:  Precautions Precautions: Fall Restrictions Weight Bearing Restrictions: No   Pain: Pain Assessment Pain Scale: 0-10 Pain Score: 0-No pain    Therapy/Group: Individual Therapy   KWindell Norfolk DPT, PN1   [place] Physical Therap[place]   Pager [phone] Rehab Office [phone]   01/19/2020, 12:11 PM

## 2020-01-19 NOTE — Progress Notes (Signed)
[place] PHYSICAL MEDICINE & REHABILITATION PROGRESS NOTE  Subjective/Complaints: Patient seen sitting up in a chair this morning working with therapies.  She states she slept well overnight.  She states she had a good weekend.  She states her husband or the ER visitor at the hospital now because her sister had to undergo a cardiac procedure.  Discussed cognition with therapies-improving with some areas medication management.  ROS: Denies CP, SOB, N/V/D  Objective: Vital Signs: Blood pressure (!) 152/68, pulse 87, temperature 97.7 F (36.5 C), temperature source Oral, resp. rate 18, height 5' 1.02" (1.55 m), weight 76.8 kg, SpO2 98 %. No results found. No results for input(s): WBC, HGB, HCT, PLT in the last 72 hours. Recent Labs    01/19/20 0806  NA 137  K 4.5  CL 103  CO2 24  GLUCOSE 174*  BUN 18  CREATININE 0.84  CALCIUM 8.9    Physical Exam: BP (!) 152/68 (BP Location: Left Arm)   Pulse 87   Temp 97.7 F (36.5 C) (Oral)   Resp 18   Ht 5' 1.02" (1.55 m)   Wt 76.8 kg   SpO2 98%   BMI 31.97 kg/m  Constitutional: No distress . Vital signs reviewed. HENT: Normocephalic.  Atraumatic. Eyes: EOMI. No discharge. Cardiovascular: No JVD.  RRR. Respiratory: Normal effort.  No stridor.  Bilateral clear to auscultation. GI: Non-distended. Skin: Warm and dry.  Intact. Psych: Normal mood.  Normal behavior. Musc: No edema in extremities.  No tenderness in extremities. Neuro: Alert She makes eye contact with examiner.   Follows simple commands.   Appears to have a fair awareness of deficits. Motor: RUE/RLE: 5/5 proximal to distal, no ataxia RUE LUE: 4+/5 proximal to distal LLE: 4+/5 proximal to distal LUE: Mild ataxia, unchanged  Assessment/Plan: 1. 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  Physiatrist is providing close team supervision and 24 hour  management of active medical problems listed below.  Physiatrist and rehab team continue to assess barriers to discharge/monitor patient progress toward functional and medical goals  Care Tool:  Bathing    Body parts bathed by patient: Face, Chest, Abdomen   Body parts bathed by helper: Front perineal area     Bathing assist Assist Level: Minimal Assistance - Patient > 75%     Upper Body Dressing/Undressing Upper body dressing   What is the patient wearing?: Pull over shirt    Upper body assist Assist Level: Set up assist    Lower Body Dressing/Undressing Lower body dressing      What is the patient wearing?: Underwear/pull up     Lower body assist Assist for lower body dressing: Moderate Assistance - Patient 50 - 74%     Toileting Toileting    Toileting assist Assist for toileting: Moderate Assistance - Patient 50 - 74%     Transfers Chair/bed transfer  Transfers assist     Chair/bed transfer assist level: Moderate Assistance - Patient 50 - 74%     Locomotion Ambulation   Ambulation assist      Assist level: Contact Guard/Touching assist Assistive device: Walker-rolling Max distance: 14ft   Walk 10 feet activity   Assist     Assist level: Contact Guard/Touching assist Assistive device: Walker-rolling   Walk 50 feet activity   Assist Walk 50 feet with 2 turns activity did not occur: Safety/medical concerns         Walk  150 feet activity   Assist Walk 150 feet activity did not occur: Safety/medical concerns         Walk 10 feet on uneven surface  activity   Assist Walk 10 feet on uneven surfaces activity did not occur: Safety/medical concerns         Wheelchair     Assist Will patient use wheelchair at discharge?: No             Wheelchair 50 feet with 2 turns activity    Assist            Wheelchair 150 feet activity     Assist            Medical Problem List and Plan: 1.  Left side  hemiparesis secondary to right MCA infarction is felt to be thromboembolic secondary to small and large vessel disease  Continue therapies 2.  Antithrombotics: -DVT/anticoagulation: Lovenox             -antiplatelet therapy: Aspirin 325 mg and Plavix 75 mg daily x3 months then Plavix alone 3. Pain Management: Tylenol as needed 4. Mood: Provide emotional support             -antipsychotic agents: N/A 5. Neuropsych: This patient is capable of making decisions on her own behalf. 6. Skin/Wound Care: Routine skin checks 7. Fluids/Electrolytes/Nutrition: Routine in and outs.  8.  Hypertension.  Norvasc 10 mg daily.     Vitals:   01/18/20 1934 01/19/20 0507  BP: 138/64 (!) 152/68  Pulse: 91 87  Resp: 18 18  Temp: 97.8 F (36.6 C) 97.7 F (36.5 C)  SpO2: 99% 98%   Cozaar incrased to  to 100mg    Slightly labile on 7/5 9.  Diabetes mellitus with peripheral neuropathy.  Hemoglobin A1c 7.6.  SSI. Metformin 500 BID PTA. CBG (last 3)  Recent Labs    01/18/20 1638 01/18/20 2055 01/19/20 0624  GLUCAP 190* 180* 119*   Metformin 500 daily started on 7/5  Monitor with increased mobility 10.  Hypothyroidism.  Synthroid 11.  Hyperlipidemia.  Lipitor 12. Obesity: Encourage weight loss 13.  Hyponatremia  Sodium 137 on 7/5  Continue to monitor 14.  Elevated BUN: Improved  WNL on 7/5  Continue to encourage fluids  LOS: 5 days A FACE TO FACE EVALUATION WAS PERFORMED  Ireene Ballowe 9/5 01/19/2020, 10:37 AM

## 2020-01-19 NOTE — Plan of Care (Signed)
  Problem: Consults Goal: RH STROKE PATIENT EDUCATION Description: See Patient Education module for education specifics  Outcome: Progressing Goal: Diabetes Guidelines if Diabetic/Glucose > 140 Description: If diabetic or lab glucose is > 140 mg/dl - Initiate Diabetes/Hyperglycemia Guidelines & Document Interventions  Outcome: Progressing   Problem: RH BLADDER ELIMINATION Goal: RH STG MANAGE BLADDER WITH ASSISTANCE Description: STG Manage Bladder With supervision Assistance Outcome: Progressing   Problem: RH SKIN INTEGRITY Goal: RH STG SKIN FREE OF INFECTION/BREAKDOWN Description: Pt will be free of skin breakdown/infection prior to DC with supervision assist Outcome: Progressing Goal: RH STG MAINTAIN SKIN INTEGRITY WITH ASSISTANCE Description: STG Maintain Skin Integrity With supervision Assistance. Outcome: Progressing   Problem: RH SAFETY Goal: RH STG ADHERE TO SAFETY PRECAUTIONS W/ASSISTANCE/DEVICE Description: STG Adhere to Safety Precautions With cues/reminders Assistance/Device. Outcome: Progressing   Problem: RH KNOWLEDGE DEFICIT Goal: RH STG INCREASE KNOWLEDGE OF DIABETES Description: Pt/family will be able to demonstrate knowledge of DM management with medications and diet precautions and ways to monitor for hypo/hyperglycemia with cues/reminders assist using handouts/booklets provided by staff Outcome: Progressing Goal: RH STG INCREASE KNOWLEDGE OF HYPERTENSION Description: Pt/family will be able to demonstrate knowledge of HTN management with medications and diet precautions and ways to monitor blood pressure with cues/reminders assist using handouts/booklets provided by staff Outcome: Progressing Goal: RH STG INCREASE KNOWLEGDE OF HYPERLIPIDEMIA Description: Pt/family will be able to demonstrate knowledge of HLD management with medications and diet precautions with cues/reminders assist using handouts/booklets provided by staff Outcome: Progressing Goal: RH STG  INCREASE KNOWLEDGE OF STROKE PROPHYLAXIS Description: Pt/family will be able to demonstrate knowledge of stroke prevention with medications and diet precautions and ways to monitor signs/symptoms of stroke with cues/reminders assist using handouts/booklets provided by staff Outcome: Progressing   

## 2020-01-20 ENCOUNTER — Inpatient Hospital Stay (HOSPITAL_COMMUNITY): Payer: Medicare HMO | Admitting: Physical Therapy

## 2020-01-20 ENCOUNTER — Inpatient Hospital Stay (HOSPITAL_COMMUNITY): Payer: Medicare HMO | Admitting: Speech Pathology

## 2020-01-20 ENCOUNTER — Inpatient Hospital Stay (HOSPITAL_COMMUNITY): Payer: Medicare HMO | Admitting: Occupational Therapy

## 2020-01-20 LAB — GLUCOSE, CAPILLARY
Glucose-Capillary: 138 mg/dL — ABNORMAL HIGH (ref 70–99)
Glucose-Capillary: 165 mg/dL — ABNORMAL HIGH (ref 70–99)
Glucose-Capillary: 132 mg/dL — ABNORMAL HIGH (ref 70–99)
Glucose-Capillary: 166 mg/dL — ABNORMAL HIGH (ref 70–99)

## 2020-01-20 NOTE — Progress Notes (Signed)
Speech Language Pathology Daily Session Note  Patient Details  Name: Jill Rose MRN: [id number] Date of Birth: Aug 18, 1940  Today's Date: 01/20/2020 SLP Individual Time: 1405-1500 SLP Individual Time Calculation (min): 55 min  Short Term Goals: Week 1: SLP Short Term Goal 1 (Week 1): Pt will demonstrate ability to problem solve basic to mildly compex functional tasks with Min A verbal and visual cues. SLP Short Term Goal 2 (Week 1): Pt will recall new and/or daily information with Mod A verbal and visual cues for use of compensatory straegies and/or aids. SLP Short Term Goal 3 (Week 1): Pt will demonsrate ability to detect functional errors with Mod A verbal and visual cues.  Skilled Therapeutic Interventions: Pt was seen for skilled ST targeting cognitive goals. Pt required Mod A verbal and visual cues for error awareness and problem solving semi-complex functional math problems from the ALFA. She also required Min A verbal and visual cues for recall and functional problem solving use of her iPad and cell phone during session. Pt left sitting in recliner with alarm set and needs within reach. Continue per current plan of care.          Pain Pain Assessment Pain Scale: 0-10 Pain Score: 0-No pain  Therapy/Group: Individual Therapy  Little Ishikawa 01/20/2020, 7:17 AM

## 2020-01-20 NOTE — Plan of Care (Signed)
  Problem: Consults Goal: RH STROKE PATIENT EDUCATION Description: See Patient Education module for education specifics  Outcome: Progressing Goal: Diabetes Guidelines if Diabetic/Glucose > 140 Description: If diabetic or lab glucose is > 140 mg/dl - Initiate Diabetes/Hyperglycemia Guidelines & Document Interventions  Outcome: Progressing   Problem: RH BLADDER ELIMINATION Goal: RH STG MANAGE BLADDER WITH ASSISTANCE Description: STG Manage Bladder With supervision Assistance Outcome: Progressing   Problem: RH SKIN INTEGRITY Goal: RH STG SKIN FREE OF INFECTION/BREAKDOWN Description: Pt will be free of skin breakdown/infection prior to DC with supervision assist Outcome: Progressing Goal: RH STG MAINTAIN SKIN INTEGRITY WITH ASSISTANCE Description: STG Maintain Skin Integrity With supervision Assistance. Outcome: Progressing   Problem: RH SAFETY Goal: RH STG ADHERE TO SAFETY PRECAUTIONS W/ASSISTANCE/DEVICE Description: STG Adhere to Safety Precautions With cues/reminders Assistance/Device. Outcome: Progressing   Problem: RH KNOWLEDGE DEFICIT Goal: RH STG INCREASE KNOWLEDGE OF DIABETES Description: Pt/family will be able to demonstrate knowledge of DM management with medications and diet precautions and ways to monitor for hypo/hyperglycemia with cues/reminders assist using handouts/booklets provided by staff Outcome: Progressing Goal: RH STG INCREASE KNOWLEDGE OF HYPERTENSION Description: Pt/family will be able to demonstrate knowledge of HTN management with medications and diet precautions and ways to monitor blood pressure with cues/reminders assist using handouts/booklets provided by staff Outcome: Progressing Goal: RH STG INCREASE KNOWLEGDE OF HYPERLIPIDEMIA Description: Pt/family will be able to demonstrate knowledge of HLD management with medications and diet precautions with cues/reminders assist using handouts/booklets provided by staff Outcome: Progressing Goal: RH STG  INCREASE KNOWLEDGE OF STROKE PROPHYLAXIS Description: Pt/family will be able to demonstrate knowledge of stroke prevention with medications and diet precautions and ways to monitor signs/symptoms of stroke with cues/reminders assist using handouts/booklets provided by staff Outcome: Progressing   

## 2020-01-20 NOTE — Progress Notes (Addendum)
Occupational Therapy Session Note  Patient Details  Name: Jill Rose MRN: [id number] Date of Birth: 1941-06-01  Today's Date: 01/20/2020 OT Individual Time:First session: 1000-1102 ; Second Session: 1300-1330 OT Individual Time Calculation (min):  First session; 62 min  ; Second session: 30 min   Short Term Goals: Week 1:  OT Short Term Goal 1 (Week 1): Pt will be able to don pants over feet with min A. OT Short Term Goal 2 (Week 1): Pt will be able to pull pants over hips with CGA. OT Short Term Goal 3 (Week 1): Pt will complete toilet transfer with min A. OT Short Term Goal 4 (Week 1): Pt will be able to write her name with R hand with fair legibility.  Skilled Therapeutic Interventions/Progress Updates:     First Session: Pt sitting up in recliner reporting fatigue and declining ADLs this AM.  Pt agreeable to seated neuro re-ed, fine motor coordination training, and strengthening BUE in room.  Instructed pt through shoulder abduction, lat pull downs, and triceps pulls using level 2 resistive band 3 x 15 reps.  Pt needing intermittent TCs and VCs to attend to LUE and improve body mechanics.    Pt participated in gross motor coordination activity using BUE in sitting position including ball toss/bounce over target and zoom ball tasks.  Pt needing frequent intermittent VCs to attend to left side for improved gross motor control.  Pt exhibiting difficulty tossing and bouncing ball with LUE compared to right exhibiting delayed motor response.  Pt then instructed through fine motor coordination table top tasks with focus on left hand utilization to pick up and place thumb tacks to strengthen visual tracking and saccades as well as improved pinch strength and precision.  Pt required increased time to complete task.  Pt then utilized BUE to string small beads selected from container of various size beads using left hand.  Pt reporting good awareness of left hand motor control deficits. Call bell in  reach, seat alarm on.  Second Session: Pt with NT requesting to toilet.  OT encouraged pt to ambulate from recliner to bathroom and pt agreeable needing CGA and max VCs to facilitate reduced shuffle of BLE.  Pt completed clothing mgt and pericare and toilet transfer with CGA.  Pt ambulated from bathroom to sink and washed hands in standing with CGA.  Pt returned to recliner needing max VCs to reduce shuffling of BLE. Provided visual demonstration of SPT with use of minimal stepping using RW and educated pt on sequencing during transfer.  Pt set goal of using 4 steps to complete SPT recliner to w/c.  Pt attempted but completed with 8 steps in shuffling pattern.  Discussed discrepancy between goal and actual performance.  Pt verbalized good awareness of limitation.  Pt attempted SPT w/c to recliner and able to complete with 4 steps this time.  Pt attempted transfer recliner<> w/c again and both times with 4 steps and reduce shuffling of BLE.  Call bell in reach, seat alarm on.  Therapy Documentation Precautions:  Precautions Precautions: Fall Restrictions Weight Bearing Restrictions: No    Pain: Pain Assessment Pain Scale: 0-10 Pain Score: 0-No pain   Therapy/Group: Individual Therapy  Amie Critchley 01/20/2020, 12:25 PM

## 2020-01-20 NOTE — Progress Notes (Signed)
[facility] PHYSICAL MEDICINE & REHABILITATION PROGRESS NOTE  Subjective/Complaints: Patient seen sitting up in her chair this morning.  She states she slept well overnight.  She notes that she has some dizziness upon getting up after increase her blood pressure medication.  Encouraged appropriate fluid intake.  ROS: Denies CP, SOB, N/V/D  Objective: Vital Signs: Blood pressure 138/64, pulse 80, temperature 98.3 F (36.8 C), resp. rate 18, height 5' 1.02" (1.55 m), weight 76.8 kg, SpO2 100 %. No results found. No results for input(s): WBC, HGB, HCT, PLT in the last 72 hours. Recent Labs    01/19/20 0806  NA 137  K 4.5  CL 103  CO2 24  GLUCOSE 174*  BUN 18  CREATININE 0.84  CALCIUM 8.9    Physical Exam: BP 138/64 (BP Location: Right Arm)   Pulse 80   Temp 98.3 F (36.8 C)   Resp 18   Ht 5' 1.02" (1.55 m)   Wt 76.8 kg   SpO2 100%   BMI 31.97 kg/m  Constitutional: No distress . Vital signs reviewed. HENT: Normocephalic.  Atraumatic. Eyes: EOMI. No discharge. Cardiovascular: No JVD.  RRR.   Respiratory: Normal effort.  No stridor.  Bilaterally clear to auscultation  GI: Non-distended.  BS + Skin: Warm and dry.  Intact. Psych: Normal mood.  Normal behavior. Musc: No edema in extremities.  No tenderness in extremities. Neuro: Alert She makes eye contact with examiner.   Follows simple commands.   Appears to have a fair awareness of deficits. Motor: RUE/RLE: 5/5 proximal to distal, no ataxia RUE LUE: 4+/5 proximal to distal LLE: 4+/5 proximal to distal LUE: Mild ataxia, stable  Assessment/Plan: 1. 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  Physiatrist is providing close team supervision and 24 hour management of active medical problems listed below.  Physiatrist and rehab team continue to assess barriers to discharge/monitor patient progress toward  functional and medical goals  Care Tool:  Bathing    Body parts bathed by patient: Face, Chest, Abdomen   Body parts bathed by helper: Front perineal area     Bathing assist Assist Level: Minimal Assistance - Patient > 75%     Upper Body Dressing/Undressing Upper body dressing   What is the patient wearing?: Pull over shirt    Upper body assist Assist Level: Set up assist    Lower Body Dressing/Undressing Lower body dressing      What is the patient wearing?: Underwear/pull up     Lower body assist Assist for lower body dressing: Moderate Assistance - Patient 50 - 74%     Toileting Toileting    Toileting assist Assist for toileting: Moderate Assistance - Patient 50 - 74%     Transfers Chair/bed transfer  Transfers assist     Chair/bed transfer assist level: Contact Guard/Touching assist     Locomotion Ambulation   Ambulation assist      Assist level: Contact Guard/Touching assist Assistive device: Walker-rolling Max distance: 64ft   Walk 10 feet activity   Assist     Assist level: Contact Guard/Touching assist Assistive device: Walker-rolling   Walk 50 feet activity   Assist Walk 50 feet with 2 turns activity did not occur: Safety/medical concerns  Assist level: Contact Guard/Touching assist Assistive device: Walker-rolling    Walk 150 feet activity   Assist Walk 150 feet activity did not occur: Safety/medical concerns  Walk 10 feet on uneven surface  activity   Assist Walk 10 feet on uneven surfaces activity did not occur: Safety/medical concerns         Wheelchair     Assist Will patient use wheelchair at discharge?: No             Wheelchair 50 feet with 2 turns activity    Assist            Wheelchair 150 feet activity     Assist            Medical Problem List and Plan: 1.  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  Antithrombotics: -DVT/anticoagulation: Lovenox             -antiplatelet therapy: Aspirin 325 mg and Plavix 75 mg daily x3 months then Plavix alone 3. Pain Management: Tylenol as needed 4. Mood: Provide emotional support             -antipsychotic agents: N/A 5. Neuropsych: This patient is capable of making decisions on her own behalf. 6. Skin/Wound Care: Routine skin checks 7. Fluids/Electrolytes/Nutrition: Routine in and outs.  8.  Hypertension.  Norvasc 10 mg daily.     Vitals:   01/19/20 1948 01/20/20 0444  BP: (!) 132/58 138/64  Pulse: 84 80  Resp: 18 18  Temp: 98.5 F (36.9 C) 98.3 F (36.8 C)  SpO2: 100% 100%   Cozaar incrased to  to 100mg    Improving on 7/6, monitor for orthostasis 9.  Diabetes mellitus with peripheral neuropathy.  Hemoglobin A1c 7.6.  SSI. Metformin 500 BID PTA. CBG (last 3)  Recent Labs    01/19/20 1646 01/19/20 2111 01/20/20 0603  GLUCAP 139* 156* 138*   Metformin 500 daily started on 7/5  Remains elevated on 7/6  Monitor with increased mobility 10.  Hypothyroidism.  Synthroid 11.  Hyperlipidemia.  Lipitor 12. Obesity: Encourage weight loss 13.  Hyponatremia: Resolved  Sodium 137 on 7/5  Continue to monitor 14.  Elevated BUN: Improved  WNL on 7/5  Continue to encourage fluids, reminded again  LOS: 6 days A FACE TO FACE EVALUATION WAS PERFORMED  Jill Rose 9/5 01/20/2020, 8:21 AM

## 2020-01-20 NOTE — Progress Notes (Signed)
Physical Therapy Session Note  Patient Details  Name: Jill Rose MRN: [id number] Date of Birth: [birth year]  Today's Date: 01/20/2020 PT Individual Time: 0800-0845 PT Individual Time Calculation (min): 45 min   Short Term Goals: Week 1:  PT Short Term Goal 1 (Week 1): Pt will perform bed mobility with supervision PT Short Term Goal 2 (Week 1): Pt will perform bed to chair with CGA and LRAD PT Short Term Goal 3 (Week 1): Pt will ambulate 100' with CGA and LRAD PT Short Term Goal 4 (Week 1): Pt will complete 4 steps with BHRs and CGA.  Skilled Therapeutic Interventions/Progress Updates:    Patient received in recliner, reports she really did not sleep well last night at all. Able to perform functional transfers with min guard and RW but much more unsteady and with much more poor sequencing than usual, Max cues for hand placement. Orthostatics taken during session due to her reports of being dizzy when walking to the bathroom, see below for details. Got to mat table and worked on bridges, SLRs, sidelying hip ABD but with severe sequencing issues with rolling requiring heavy modA. Session severely limited by fatigue. She was left up in recliner with chair alarm active, all needs met. 15 minutes of skilled PT time lost due to fatigue.  Sitting BP 145/64 HR 90 MAP 87 Standing BP 147/70 HR 100 MAP 88 Standing x3 minutes BP 162/72 HR 101 MAP 99  Therapy Documentation Precautions:  Precautions Precautions: Fall Restrictions Weight Bearing Restrictions: No General: PT Amount of Missed Time (min): 15 Minutes PT Missed Treatment Reason: Patient fatigue    Pain: Pain Assessment Pain Scale: 0-10 Pain Score: 0-No pain    Therapy/Group: Individual Therapy   Windell Norfolk, DPT, PN1   Supplemental Physical Therapist [place]    Pager [phone] Acute Rehab Office [phone]    01/20/2020, 12:09 PM

## 2020-01-21 ENCOUNTER — Inpatient Hospital Stay (HOSPITAL_COMMUNITY): Payer: Medicare HMO | Admitting: Physical Therapy

## 2020-01-21 ENCOUNTER — Inpatient Hospital Stay (HOSPITAL_COMMUNITY): Payer: Medicare HMO | Admitting: Occupational Therapy

## 2020-01-21 ENCOUNTER — Inpatient Hospital Stay (HOSPITAL_COMMUNITY): Payer: Medicare HMO | Admitting: Speech Pathology

## 2020-01-21 DIAGNOSIS — E1165 Type 2 diabetes mellitus with hyperglycemia: Secondary | ICD-10-CM

## 2020-01-21 LAB — CREATININE, SERUM
Creatinine, Ser: 0.83 mg/dL (ref 0.44–1.00)
GFR calc Af Amer: >60 mL/min (ref >60)
GFR calc non Af Amer: >60 mL/min (ref >60)

## 2020-01-21 LAB — GLUCOSE, CAPILLARY
Glucose-Capillary: 149 mg/dL — ABNORMAL HIGH (ref 70–99)
Glucose-Capillary: 124 mg/dL — ABNORMAL HIGH (ref 70–99)
Glucose-Capillary: 195 mg/dL — ABNORMAL HIGH (ref 70–99)
Glucose-Capillary: 137 mg/dL — ABNORMAL HIGH (ref 70–99)

## 2020-01-21 MED ORDER — METFORMIN HCL 500 MG PO TABS
500.0000 mg | ORAL_TABLET | Freq: Two times a day (BID) | ORAL | Status: DC
  Administered 2020-01-21 – 2020-01-29 (×16): 500 mg via ORAL
  Filled 2020-01-21 (×16): qty 1

## 2020-01-21 NOTE — Progress Notes (Signed)
Chaplain responded to Spiritual Consult from patient requesting prayer.  Jill Rose spoke of the day she had thes troke and how she has not been able to pray for her children and her family for the first 4 days because her mind has been scrambled.  Today for the first time she was able to recall all her family names and prayed for each of them.  Jill Rose hopes the [name] will heal her.  She believes God has given her a second chance and how she is excited to start cooking well and exercising.  These were the things we talked to God about in prayer.  Chaplain invited Jill Rose to call for a Chaplain at any time she desires spiritual companionship.Vernell Morgans Chaplain Resident

## 2020-01-21 NOTE — Progress Notes (Signed)
Occupational Therapy Session Note  Patient Details  Name: Jill Rose MRN: [id number] Date of Birth: March 26, 1941  Today's Date: 01/21/2020 OT Individual Time: 1130-1200 OT Individual Time Calculation (min): 30 min    Short Term Goals: Week 1:  OT Short Term Goal 1 (Week 1): Pt will be able to don pants over feet with min A. OT Short Term Goal 2 (Week 1): Pt will be able to pull pants over hips with CGA. OT Short Term Goal 3 (Week 1): Pt will complete toilet transfer with min A. OT Short Term Goal 4 (Week 1): Pt will be able to write her name with R hand with fair legibility.  Skilled Therapeutic Interventions/Progress Updates:    Pt sitting up in recliner requesting to use bathroom at OT arrival. No c/o pain throughout session.  Pt ambulated to bathroom with RW needing supervision to facilitate reduced shuffling gait through cueing pt to complete mobility using least amount of steps.  Pt noted with increased step length and reduced shuffling with cues.  Pt completed 3/3 toileting tasks with supervision. Pt ambulated toilet to sinkside using RW with similar cueing needing SBA to move w/c obstacle away from walking path.  Pt returned to recliner with supervision for min VCs on RW mgt during pivot.  Pt educated on reacher and sock aide use during LB dressing due to pt with difficulty getting into figure 4 position.  Pt return demonstrated donning/doffing socks using AE with supervision for intermittent problem solving.  Pt became tearful at end of session stating "I havent been able to pray for my family until this morning since Ive been here, I usually pray every day".  OT provided emotional support and offered referral to Chaplain.  Pt reports interest in meeting with Chaplain and OT made nursing aware for referral. Pt left with SW arrival upon OT departure.  Therapy Documentation Precautions:  Precautions Precautions: Fall Restrictions Weight Bearing Restrictions: No   Therapy/Group:  Individual Therapy  Amie Critchley 01/21/2020, 12:30 PM

## 2020-01-21 NOTE — Patient Care Conference (Signed)
Inpatient RehabilitationTeam Conference and Plan of Care Update Date: 01/21/2020   Time: 1:50 PM    Patient Name: Jill Rose      Medical Record Number: [id number]  Date of Birth: 19-Aug-1940 Sex: Adult         Room/Bed: 4M02C/4M02C-01 Payor Info: Payor: [employer] MEDICARE / Plan: [employer] MEDICARE HMO/PPO / Product Type: *No Product type* /    Admit Date/Time:  01/14/2020  5:25 PM  Primary Diagnosis:  Right middle cerebral artery stroke [facility])  Hospital Problems: Principal Problem:   Right middle cerebral artery stroke (HCC) Active Problems:   Elevated BUN   Hyponatremia   Diabetic peripheral neuropathy (HCC)   Labile blood glucose   Labile blood pressure    Expected Discharge Date: Expected Discharge Date: 01/29/20  Team Members Present: Physician leading conference: Dr. Maryla Morrow Care Coodinator Present: Chana Bode, RN, BSN, CRRN;Christina [place], BSW Nurse Present: Other (comment) (Angie Belk, RN) PT Present: Nedra Hai, PT OT Present: Dolphus Jenny, OT SLP Present: Colin Benton, SLP PPS Coordinator present : Edson Snowball, Park Breed, SLP     Current Status/Progress Goal Weekly Team Focus  Bowel/Bladder   continent x2 LBM 7/5  remain continent  assess toileting needs qshift and PRN   Swallow/Nutrition/ Hydration             ADL's   CGA for functional transfers; mod assist for LB dressing; min assist for bathing; CGA toileting/grooming/hygiene  supervision for ADLs and transfers  functional mobility training, LUE strengthening and gross/fine motor coordination neuro re-ed, self care training   Mobility   ModA rolling, S bed mobility with bed features, min guard transfers and gait 30-54ft with RW, 8 steps B rails min guard-MinA  supervision  all aspects of functional mobility, gait and stair training, reciprocal movements, balance, functional activity tolerance, safety   Communication             Safety/Cognition/ Behavioral Observations  Min-Mod A  depending on complexity  Supervision A  semi-complex to complex problem solving, emergent awareness, recall   Pain   No c/o pain  remain pain free  assess pain qshift and PRN   Skin   No skin impairments  remain free of skin impairments  assess skin qshift and prn     Team Discussion:  Discharge Planning/Teaching Needs:  Goal to discharge home  Will schedule education with family   Current Update: on target  Current Barriers to Discharge:  None  Possible Resolutions to Barriers:   Patient on target to meet rehab goals: yes, MD adjusting BP medications and dietary consult ordered per request.  *See Care Plan and progress notes for long and short-term goals.   Revisions to Treatment Plan:  On target for discharge home with spouse/sister with supervision goals.  Ramped entry to home and handicapped accessible bath.    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 is felt to be thromboembolic secondary to small and large vessel disease Weekly Focus/Goal: Improve mobility, CBGs, BP, BUN  Barriers to Discharge: Medical stability   Possible Resolutions to Barriers: Therapies, optimize DM/BP meds, dietary consult, encourage fluids   Continued Need for Acute Rehabilitation Level of Care: The patient requires daily medical management by a physician with specialized training in physical medicine and rehabilitation for the following reasons: Direction of a multidisciplinary physical rehabilitation program to maximize functional independence : Yes Medical management of patient stability for increased activity during participation in an intensive rehabilitation regime.: Yes  Analysis of laboratory values and/or radiology reports with any subsequent need for medication adjustment and/or medical intervention. : Yes   I attest that I was present, lead the team conference, and concur with the assessment and plan of the team.   Chana Bode  B 01/21/2020, 1:50 PM

## 2020-01-21 NOTE — Progress Notes (Signed)
Physical Therapy Session Note  Patient Details  Name: Jill Rose MRN: [id number] Date of Birth: [birth year]  Today's Date: 01/21/2020 PT Individual Time: 1451-1530 PT Individual Time Calculation (min): 39 min   Short Term Goals: Week 1:  PT Short Term Goal 1 (Week 1): Pt will perform bed mobility with supervision PT Short Term Goal 2 (Week 1): Pt will perform bed to chair with CGA and LRAD PT Short Term Goal 3 (Week 1): Pt will ambulate 100' with CGA and LRAD PT Short Term Goal 4 (Week 1): Pt will complete 4 steps with BHRs and CGA.  Skilled Therapeutic Interventions/Progress Updates:    Patient received in recliner, feeling better today and very pleasant, willing to participate in therapy. Able to complete all functional transfers to/from [facility] with min guard and RW with cues for hand placement and forward translation of head during sit to stand. Tolerated riding Nustep for 8 minutes on level 1 with BLEs only but fatigued afterwards. Otherwise worked on functional gait training with focus on upright posture and improving step length with mixed results, possibly due to fatigue after riding Nustep; able to gait train 40f with RW/min guard before fatiguing. She was left up in recliner with all needs met, chair alarm active this afternoon.   Therapy Documentation Precautions:  Precautions Precautions: Fall Restrictions Weight Bearing Restrictions: No Pain: Pain Assessment Pain Scale: 0-10 Pain Score: 0-No pain    Therapy/Group: Individual Therapy   KWindell Norfolk DPT, PN1   Supplemental Physical Therapist [place]   Pager [phone] Rehab Office [phone]   01/21/2020, 3:56 PM

## 2020-01-21 NOTE — Progress Notes (Signed)
Patient ID: Jill Rose, adult   DOB: [birth year], 79 y.o.   MRN: [id number]  Team Conference Report to Patient/Family  Team Conference discussion was reviewed with the patient and caregiver, including goals, any changes in plan of care and target discharge date.  Patient and caregiver express understanding and are in agreement.  The patient has a target discharge date of 01/29/20.  Andria Rhein 01/21/2020, 1:54 PM

## 2020-01-21 NOTE — Progress Notes (Signed)
Speech Language Pathology Daily Session Note  Patient Details  Name: Jill Rose MRN: [id number] Date of Birth: Aug 27, 1940  Today's Date: 01/21/2020 SLP Individual Time: 0733-0802 SLP Individual Time Calculation (min): 29 min  Short Term Goals: Week 1: SLP Short Term Goal 1 (Week 1): Pt will demonstrate ability to problem solve basic to mildly compex functional tasks with Min A verbal and visual cues. SLP Short Term Goal 2 (Week 1): Pt will recall new and/or daily information with Mod A verbal and visual cues for use of compensatory straegies and/or aids. SLP Short Term Goal 3 (Week 1): Pt will demonsrate ability to detect functional errors with Mod A verbal and visual cues.  Skilled Therapeutic Interventions: Pt was seen for skilled ST targeting cognitive goals. SLP facilitated session with education and handout regarding compensatory memory strategies (WRAP). Pt then utilized picture it strategy to recall details from picture scenes with Min A faded to Supervision A verbal cues with 85% accuracy. Pt left sitting in bed with alarm set and needs within reach. Continue per current plan of care.        Pain Pain Assessment Pain Scale: 0-10 Pain Score: 0-No pain  Therapy/Group: Individual Therapy  Little Ishikawa 01/21/2020, 7:29 AM

## 2020-01-21 NOTE — Progress Notes (Signed)
Brief Nutrition Note  RD consulted for diet education per pt's request. RD working remotely today.  RD spoke with pt via phone call. Pt reports needing information regarding how she should eat at discharge and changes she should make in her diet. Pt also requesting information regarding appropriate carbohydrate servings at each meal.  Pt requests that RD come by in-person tomorrow to provide education due to memory deficits related to pt's recent stroke. RD will plan to meet with pt at bedside tomorrow, 01/21/20, to provide in-person education and handouts.   Earma Reading, MS, RD, LDN Inpatient Clinical Dietitian Please see AMiON for contact information.

## 2020-01-21 NOTE — Plan of Care (Signed)
  Problem: RH SAFETY Goal: RH STG ADHERE TO SAFETY PRECAUTIONS W/ASSISTANCE/DEVICE Description: STG Adhere to Safety Precautions With cues/reminders Assistance/Device. Outcome: Progressing   

## 2020-01-21 NOTE — Progress Notes (Signed)
Occupational Therapy Session Note  Patient Details  Name: Jill Rose MRN: [id number] Date of Birth: [birth year]  Today's Date: 01/21/2020 OT Individual Time: 0915-1010 and 1335-1414 OT Individual Time Calculation (min): 55 min and 39 min   Short Term Goals: Week 1:  OT Short Term Goal 1 (Week 1): Pt will be able to don pants over feet with min A. OT Short Term Goal 2 (Week 1): Pt will be able to pull pants over hips with CGA. OT Short Term Goal 3 (Week 1): Pt will complete toilet transfer with min A. OT Short Term Goal 4 (Week 1): Pt will be able to write her name with R hand with fair legibility.  Skilled Therapeutic Interventions/Progress Updates:    Pt greeted at time of session sitting up in recliner agreeable to OT session but declined bathing, dressing, toileting stating she did not need to. AAROM for LUE seated as a warm up activity for shoulder flex/ext, abd/add, circumduction with stretch at end range within her tolerance. Sit to stand CGA and SPT to wheelchair CGA/Min with RW with extended time to manage feet, takes very small steps. Brought to gym via wheelchair for time management and performed standing dynavision with pr crossing midline with BUEs and improving attention to L side. Multiple rounds of standing for 1-2 minute intervals with mildly decreased reaction speed and attention on L. SCIFIT seated for 2 rounds of 3 minutes forward and backwards with therapist facilitation at L scap/shoulder for full ROM for NMR for LUE. Pt brought back to room and transferred back to recliner with RW CGA, set up with alarm on, call bell in reach, all needs met.  Session 2: Pt greeted at time of session sitting up in recliner agreeable to OT session. Ambulated short distance in room to wheelchair with RW with CGA/Min A with assist to manage RW during turns and cues to decrease forward flexed posture and remain in walker. Cues throughout session for hand/foot placement during transfers. Pt brought  to apartment via wheelchair for time management and educated on options for shower chairs since she is having her 1/2 bath on first floor converted to a full bath with walk in shower, pt is going to decide if she wants to purchase on her own. Stand pivot wheelchair <> couch with CGA/Min on carpet floor to practice for home set up, cues for hand placement and sitting at end of couch for arm rest to push up on. Pt transported back to room via wheelchair and reviewed [facility] activities with putty for in room use for max carryover. Pt up in chair with alarm on, call bell in reach, all needs met.   Therapy Documentation Precautions:  Precautions Precautions: Fall Restrictions Weight Bearing Restrictions: No    Therapy/Group: Individual Therapy  Viona Gilmore 01/21/2020, 11:50 AM

## 2020-01-21 NOTE — Progress Notes (Signed)
[place] PHYSICAL MEDICINE & REHABILITATION PROGRESS NOTE  Subjective/Complaints: Patient seen sitting up at edge of her bed this morning, with therapies.  She states she slept well overnight.  Good sitting balance noted.  She request to speak with a dietitian.  She notes improvement in dizziness.  ROS: Denies CP, SOB, N/V/D  Objective: Vital Signs: Blood pressure 134/60, pulse 75, temperature 98.8 F (37.1 C), resp. rate 18, height 5' 1.02" (1.55 m), weight 76.8 kg, SpO2 98 %. No results found. No results for input(s): WBC, HGB, HCT, PLT in the last 72 hours. Recent Labs    01/19/20 0806 01/21/20 0123  NA 137  --   K 4.5  --   CL 103  --   CO2 24  --   GLUCOSE 174*  --   BUN 18  --   CREATININE 0.84 0.83  CALCIUM 8.9  --     Physical Exam: BP 134/60 (BP Location: Right Arm)   Pulse 75   Temp 98.8 F (37.1 C)   Resp 18   Ht 5' 1.02" (1.55 m)   Wt 76.8 kg   SpO2 98%   BMI 31.97 kg/m  Constitutional: No distress . Vital signs reviewed. HENT: Normocephalic.  Atraumatic. Eyes: EOMI. No discharge. Cardiovascular: No JVD.  RRR. Respiratory: Normal effort.  No stridor.  Bilaterally clear to auscultation. GI: Non-distended.  BS +. Skin: Warm and dry.  Intact. Psych: Normal mood.  Normal behavior. Musc: No edema in extremities.  No tenderness in extremities. Neuro: Alert She makes eye contact with examiner.   Follows simple commands.   Appears to have a fair awareness of deficits. Motor:  LUE: 4+/5 proximal to distal LLE: 4+/5 proximal to distal LUE: Mild ataxia, unchanged  Assessment/Plan: 1. 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  Physiatrist is providing close team supervision and 24 hour management of active medical problems listed below.  Physiatrist and rehab team continue to assess barriers to discharge/monitor patient progress toward  functional and medical goals  Care Tool:  Bathing    Body parts bathed by patient: Face, Chest, Abdomen   Body parts bathed by helper: Front perineal area     Bathing assist Assist Level: Minimal Assistance - Patient > 75%     Upper Body Dressing/Undressing Upper body dressing   What is the patient wearing?: Pull over shirt    Upper body assist Assist Level: Set up assist    Lower Body Dressing/Undressing Lower body dressing      What is the patient wearing?: Underwear/pull up     Lower body assist Assist for lower body dressing: Moderate Assistance - Patient 50 - 74%     Toileting Toileting    Toileting assist Assist for toileting: Contact Guard/Touching assist     Transfers Chair/bed transfer  Transfers assist     Chair/bed transfer assist level: Contact Guard/Touching assist     Locomotion Ambulation   Ambulation assist      Assist level: Contact Guard/Touching assist Assistive device: Walker-rolling Max distance: 48ft   Walk 10 feet activity   Assist     Assist level: Contact Guard/Touching assist Assistive device: Walker-rolling   Walk 50 feet activity   Assist Walk 50 feet with 2 turns activity did not occur: Safety/medical concerns  Assist level: Contact Guard/Touching assist Assistive device: Walker-rolling    Walk 150 feet activity   Assist Walk 150 feet activity  did not occur: Safety/medical concerns         Walk 10 feet on uneven surface  activity   Assist Walk 10 feet on uneven surfaces activity did not occur: Safety/medical concerns         Wheelchair     Assist Will patient use wheelchair at discharge?: No             Wheelchair 50 feet with 2 turns activity    Assist            Wheelchair 150 feet activity     Assist            Medical Problem List and Plan: 1.  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  2.  Antithrombotics: -DVT/anticoagulation: Lovenox  Creatinine within normal limits on 7/7             -antiplatelet therapy: Aspirin 325 mg and Plavix 75 mg daily x3 months then Plavix alone 3. Pain Management: Tylenol as needed 4. Mood: Provide emotional support             -antipsychotic agents: N/A 5. Neuropsych: This patient is capable of making decisions on her own behalf. 6. Skin/Wound Care: Routine skin checks 7. Fluids/Electrolytes/Nutrition: Routine in and outs.  8.  Hypertension.  Norvasc 10 mg daily.     Vitals:   01/20/20 1944 01/21/20 0409  BP: (!) 132/56 134/60  Pulse: 78 75  Resp: 18 18  Temp: 98.5 F (36.9 C) 98.8 F (37.1 C)  SpO2: 97% 98%   Cozaar incrased to  to 100mg    Controlled on 7/7 9.  Diabetes mellitus with peripheral neuropathy.  Hemoglobin A1c 7.6.  SSI. Metformin 500 BID PTA. CBG (last 3)  Recent Labs    01/20/20 1618 01/20/20 2110 01/21/20 0613  GLUCAP 132* 166* 149*   Dietitian consult ordered  Metformin 500 daily started on 7/5, increase to twice daily on 7/7  Remains elevated on 7/7  Monitor with increased mobility 10.  Hypothyroidism.  Synthroid 11.  Hyperlipidemia.  Lipitor 12. Obesity: Encourage weight loss 13.  Hyponatremia: Resolved  Sodium 137 on 7/5  Continue to monitor 14.  Elevated BUN: Improved  WNL on 7/5  Continue to encourage fluids, reminded again  LOS: 7 days A FACE TO FACE EVALUATION WAS PERFORMED  Jill Rose 9/5 01/21/2020, 8:12 AM

## 2020-01-22 ENCOUNTER — Inpatient Hospital Stay (HOSPITAL_COMMUNITY): Payer: Medicare HMO | Admitting: Occupational Therapy

## 2020-01-22 ENCOUNTER — Inpatient Hospital Stay (HOSPITAL_COMMUNITY): Payer: Medicare HMO | Admitting: Speech Pathology

## 2020-01-22 ENCOUNTER — Inpatient Hospital Stay (HOSPITAL_COMMUNITY): Payer: Medicare HMO | Admitting: Physical Therapy

## 2020-01-22 DIAGNOSIS — I69354 Hemiplegia and hemiparesis following cerebral infarction affecting left non-dominant side: Secondary | ICD-10-CM

## 2020-01-22 LAB — GLUCOSE, CAPILLARY
Glucose-Capillary: 121 mg/dL — ABNORMAL HIGH (ref 70–99)
Glucose-Capillary: 157 mg/dL — ABNORMAL HIGH (ref 70–99)
Glucose-Capillary: 118 mg/dL — ABNORMAL HIGH (ref 70–99)
Glucose-Capillary: 124 mg/dL — ABNORMAL HIGH (ref 70–99)

## 2020-01-22 NOTE — Progress Notes (Signed)
Physical Therapy Weekly Progress Note  Patient Details  Name: Jill Rose MRN: [id number] Date of Birth: 1941-06-27  Beginning of progress report period: January 15, 2020 End of progress report period: January 22, 2020  Today's Date: 01/22/2020 PT Individual Time: 1335-1445 PT Individual Time Calculation (min): 70 min   Patient has met 2 of 4 short term goals.  She is making slow but steady progress towards all remaining short term and long term goals. Progress has been slow in part due to impaired recall and carryover from session to session.   Patient continues to demonstrate the following deficits muscle weakness, decreased cardiorespiratoy endurance, decreased coordination and decreased motor planning, decreased motor planning, decreased initiation, decreased awareness, decreased problem solving, decreased safety awareness, decreased memory and delayed processing and decreased standing balance and decreased balance strategies and therefore will continue to benefit from skilled PT intervention to increase functional independence with mobility.  Patient progressing toward long term goals..  Continue plan of care.  PT Short Term Goals Week 1:  PT Short Term Goal 1 (Week 1): Pt will perform bed mobility with supervision PT Short Term Goal 1 - Progress (Week 1): Progressing toward goal PT Short Term Goal 2 (Week 1): Pt will perform bed to chair with CGA and LRAD PT Short Term Goal 2 - Progress (Week 1): Met PT Short Term Goal 3 (Week 1): Pt will ambulate 100' with CGA and LRAD PT Short Term Goal 3 - Progress (Week 1): Progressing toward goal PT Short Term Goal 4 (Week 1): Pt will complete 4 steps with BHRs and CGA. PT Short Term Goal 4 - Progress (Week 1): Met Week 2:  PT Short Term Goal 1 (Week 2): Patient will complete all bed mobility and rolling with MinA from flat bed PT Short Term Goal 2 (Week 2): Patient to be able to gait train 142f with S and LRAD PT Short Term Goal 3 (Week 2): Patient  to be able to perform all functional transfers with S and RW PT Short Term Goal 4 (Week 2): Patient to score at least 35/56 on Berg  Skilled Therapeutic Interventions/Progress Updates:    [facility] and willing to participate in therapy today. Able to perform functional transfers WC<->mat table with RW and min guard to MinA with ongoing cues for sequencing and hand placement. Able to complete sit to supine with MinA, but continues to require ModA for supine to sit due to impaired processing and carryover, also gross weakness. Spent time working on mechanics of rolling side to side and able to improve this significantly with Mod visual/Min verbal and MinA for correct technique. Also spent time working on trunk flexion/extension, lateral reaching and weight shifting, and trunk rotation at edge of mat table with Mod facilitation. Followed this immediately with gait training approximately 771fwith RW/S with improved step lengths as compared to her normal shuffling tendency, but continues to require Mod cues for step length and upright posture. Finished session with Nustep for 8 minutes on resistance 2 with BLEs only for improved BLE strength and coordination as well as reciprocal pattern for carryover to gait. Returned to her room and transferred back to her recliner, left up in chair with all needs met, chair alarm active this afternoon.   Therapy Documentation Precautions:  Precautions Precautions: Fall Restrictions Weight Bearing Restrictions: No  Pain: Pain Assessment Pain Scale: 0-10 Pain Score: 0-No pain    Therapy/Group: Individual Therapy  Kealie Barrie U PT, DPT, PN1  Supplemental Physical Therapist [place]    Pager [phone] Acute Rehab Office [phone]

## 2020-01-22 NOTE — Progress Notes (Signed)
Speech Language Pathology Weekly Progress and Session Note  Patient Details  Name: Jill Rose MRN: [id number] Date of Birth: [birth year]  Beginning of progress report period: January 15, 2020 End of progress report period: January 22, 2020  Today's Date: 01/22/2020 SLP Individual Time: 3329-5188 SLP Individual Time Calculation (min): 56 min  Short Term Goals: Week 1: SLP Short Term Goal 1 (Week 1): Pt will demonstrate ability to problem solve basic to mildly compex functional tasks with Min A verbal and visual cues. SLP Short Term Goal 1 - Progress (Week 1): Met SLP Short Term Goal 2 (Week 1): Pt will recall new and/or daily information with Mod A verbal and visual cues for use of compensatory straegies and/or aids. SLP Short Term Goal 2 - Progress (Week 1): Met SLP Short Term Goal 3 (Week 1): Pt will demonsrate ability to detect functional errors with Mod A verbal and visual cues. SLP Short Term Goal 3 - Progress (Week 1): Met    New Short Term Goals: Week 2: SLP Short Term Goal 1 (Week 2): STG=LTG due to remaining length of stay  Weekly Progress Updates: Pt has made functional gains and met 3 out of 3 short term goals. Pt is currently ~Min-Mod assist for due to cognitive impairments impacting her problem solving, emergent awareness, and short term memory. Pt has demonstrated improved daily recall and ability to use compensatory memory strategies, as well as basic problem solving skills. Pt education is ongoing; no family has been present for ST sessions to date. Pt would continue to benefit from skilled ST while inpatient in order to maximize functional independence and reduce burden of care prior to discharge. Anticipate that pt will need 24/7 supervision at discharge in addition to [place] follow up at next level of care.     Intensity: Minumum of 1-2 x/day, 30 to 90 minutes Frequency: 3 to 5 out of 7 days Duration/Length of Stay: 01/29/20 Treatment/Interventions: Cueing hierarchy;Cognitive  remediation/compensation;Internal/external aids;Functional tasks;Patient/family education;Therapeutic Activities   Daily Session  Skilled Therapeutic Interventions: Pt was seen for skilled ST targeting cognitive goals. SLP facilitated session with a functional complex medication management task. Pt recalled current medication names, functions, and dosages with only Supervision A verbal cues. She also used list making as compensatory memory aid to aid in carryover/future recall. Pt then organized BID pill box according to her list with 1 verbal cue for problem solving. Pt demonstrated ability to self detect and correct error X1 and only Min A cues were required for emergent awareness. Pt was left sitting in chair with alarm set and needs within reach. Continue per current plan of care.           Pain Pain Assessment Pain Scale: 0-10 Pain Score: 0-No pain  Therapy/Group: Individual Therapy  Arbutus Leas 01/22/2020, 7:12 AM

## 2020-01-22 NOTE — Progress Notes (Signed)
[place] PHYSICAL MEDICINE & REHABILITATION PROGRESS NOTE  Subjective/Complaints: Patient seen sitting up in her chair this morning.  She states she slept well overnight.  She is aware of her scheduled discharge date.  She states that she will be seeing the dietitian today.  Discussed diabetic meds with patient.  ROS: Denies CP, SOB, N/V/D  Objective: Vital Signs: Blood pressure (!) 142/52, pulse 81, temperature 98.4 F (36.9 C), resp. rate 18, height 5' 1.02" (1.55 m), weight 76.8 kg, SpO2 98 %. No results found. No results for input(s): WBC, HGB, HCT, PLT in the last 72 hours. Recent Labs    01/21/20 0123  CREATININE 0.83    Physical Exam: BP (!) 142/52 (BP Location: Right Arm)   Pulse 81   Temp 98.4 F (36.9 C)   Resp 18   Ht 5' 1.02" (1.55 m)   Wt 76.8 kg   SpO2 98%   BMI 31.97 kg/m  Constitutional: No distress . Vital signs reviewed. HENT: Normocephalic.  Atraumatic. Eyes: EOMI. No discharge. Cardiovascular: No JVD. Respiratory: Normal effort.  No stridor. GI: Non-distended. Skin: Warm and dry.  Intact. Psych: Normal mood.  Normal behavior. Musc: No edema in extremities.  No tenderness in extremities. Neuro: Alert She makes eye contact with examiner.   Follows simple commands.   Appears to have a fair awareness of deficits. Motor:  LUE: 4+/5 proximal to distal LLE: 4+/5 proximal to distal LUE: Mild ataxia, stable  Assessment/Plan: 1. 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  Physiatrist is providing close team supervision and 24 hour management of active medical problems listed below.  Physiatrist and rehab team continue to assess barriers to discharge/monitor patient progress toward functional and medical goals  Care Tool:  Bathing    Body parts bathed by patient: Face, Chest, Abdomen   Body parts bathed by helper: Front perineal area      Bathing assist Assist Level: Minimal Assistance - Patient > 75%     Upper Body Dressing/Undressing Upper body dressing   What is the patient wearing?: Pull over shirt    Upper body assist Assist Level: Set up assist    Lower Body Dressing/Undressing Lower body dressing      What is the patient wearing?: Underwear/pull up     Lower body assist Assist for lower body dressing: Moderate Assistance - Patient 50 - 74%     Toileting Toileting    Toileting assist Assist for toileting: Supervision/Verbal cueing     Transfers Chair/bed transfer  Transfers assist     Chair/bed transfer assist level: Contact Guard/Touching assist     Locomotion Ambulation   Ambulation assist      Assist level: Contact Guard/Touching assist Assistive device: Walker-rolling Max distance: 52ft   Walk 10 feet activity   Assist     Assist level: Contact Guard/Touching assist Assistive device: Walker-rolling   Walk 50 feet activity   Assist Walk 50 feet with 2 turns activity did not occur: Safety/medical concerns  Assist level: Contact Guard/Touching assist Assistive device: Walker-rolling    Walk 150 feet activity   Assist Walk 150 feet activity did not occur: Safety/medical concerns         Walk 10 feet on uneven surface  activity   Assist Walk 10 feet on uneven surfaces activity did not occur: Safety/medical concerns         Wheelchair     Assist Will  patient use wheelchair at discharge?: No             Wheelchair 50 feet with 2 turns activity    Assist            Wheelchair 150 feet activity     Assist            Medical Problem List and Plan: 1.  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  Antithrombotics: -DVT/anticoagulation: Lovenox  Creatinine within normal limits on 7/7  CBC within normal limits on 7/1, labs  ordered for tomorrow             -antiplatelet therapy: Aspirin 325 mg and Plavix 75 mg daily x3 months then Plavix alone 3. Pain Management: Tylenol as needed 4. Mood: Provide emotional support             -antipsychotic agents: N/A 5. Neuropsych: This patient is capable of making decisions on her own behalf. 6. Skin/Wound Care: Routine skin checks 7. Fluids/Electrolytes/Nutrition: Routine in and outs.  8.  Hypertension.  Norvasc 10 mg daily.     Vitals:   01/21/20 1933 01/22/20 0419  BP: (!) 121/52 (!) 142/52  Pulse: 81 81  Resp: 18 18  Temp:  98.4 F (36.9 C)  SpO2: 99% 98%   Cozaar incrased to  to 100mg    Relatively controlled on 7/8 9.  Diabetes mellitus with peripheral neuropathy.  Hemoglobin A1c 7.6.  SSI. Metformin 500 BID PTA. CBG (last 3)  Recent Labs    01/21/20 1653 01/21/20 2058 01/22/20 0617  GLUCAP 195* 137* 121*   Dietitian consult ordered  Metformin 500 daily started on 7/5, increase to twice daily on 7/7  Slightly labile on 7/8  Monitor with increased mobility 10.  Hypothyroidism.  Synthroid 11.  Hyperlipidemia. Continue Lipitor 12. Obesity: Encourage weight loss 13.  Hyponatremia: Resolved  Sodium 137 on 7/5  Continue to monitor 14.  Elevated BUN: Improved  WNL on 7/5  Continue to encourage fluids, reminded again  LOS: 8 days A FACE TO FACE EVALUATION WAS PERFORMED  Matvey Llanas 9/5 01/22/2020, 8:36 AM

## 2020-01-22 NOTE — Plan of Care (Signed)
Nutrition Education Note  RD consulted for nutrition education regarding diabetes.  Spoke with pt at bedside. Pt had completed lunch meal tray at time of RD visit.  Discussed with pt her typical daily intake at home. Pt reports usually eating 2 meals daily. Pt reports that she does not snack between meals. Pt does drink black tea with some milk and honey in the afternoons at 4:00 pm. Otherwise, pt drinks black coffee, 2% milk, and water.  Breakfast: tortilla with eggs and vegetables, 1/2 plantain Dinner: Research scientist (life sciences), rice (occasionally), frozen microwave vegetables, milk to drink  Lab Results  Component Value Date   HGBA1C 7.6 (H) 01/06/2020    RD provided "Carbohydrate Counting for People with Diabetes" handout from the Academy of [employer] and Dietetics. Discussed different food groups and their effects on blood sugar, emphasizing carbohydrate-containing foods. Provided list of carbohydrates and recommended serving sizes of common foods.  Discussed importance of controlled and consistent carbohydrate intake throughout the day. Provided examples of ways to balance meals/snacks and encouraged intake of high-fiber, whole grain complex carbohydrates. Teach back method used.  Expect good compliance.  Body mass index is 31.97 kg/m. Pt meets criteria for obesity class I based on current BMI.  Current diet order is Carb Modified, patient is consuming approximately 100% of meals at this time. Labs and medications reviewed. No further nutrition interventions warranted at this time. RD contact information provided. If additional nutrition issues arise, please re-consult RD.   Jill Reading, MS, RD, LDN Inpatient Clinical Dietitian Please see AMiON for contact information.

## 2020-01-22 NOTE — Plan of Care (Signed)
  Problem: Consults Goal: RH STROKE PATIENT EDUCATION Description: See Patient Education module for education specifics  Outcome: Progressing Goal: Diabetes Guidelines if Diabetic/Glucose > 140 Description: If diabetic or lab glucose is > 140 mg/dl - Initiate Diabetes/Hyperglycemia Guidelines & Document Interventions  Outcome: Progressing   Problem: RH BLADDER ELIMINATION Goal: RH STG MANAGE BLADDER WITH ASSISTANCE Description: STG Manage Bladder With supervision Assistance Outcome: Progressing Flowsheets (Taken 01/22/2020 1515) STG: Pt will manage bladder with assistance: 3-Moderate assistance   Problem: RH SKIN INTEGRITY Goal: RH STG SKIN FREE OF INFECTION/BREAKDOWN Description: Pt will be free of skin breakdown/infection prior to DC with supervision assist Outcome: Progressing Goal: RH STG MAINTAIN SKIN INTEGRITY WITH ASSISTANCE Description: STG Maintain Skin Integrity With supervision Assistance. Outcome: Progressing Flowsheets (Taken 01/22/2020 1515) STG: Maintain skin integrity with assistance: 4-Minimal assistance   Problem: RH SAFETY Goal: RH STG ADHERE TO SAFETY PRECAUTIONS W/ASSISTANCE/DEVICE Description: STG Adhere to Safety Precautions With cues/reminders Assistance/Device. Outcome: Progressing Flowsheets (Taken 01/22/2020 1515) STG:Pt will adhere to safety precautions with assistance/device: 4-Minimal assistance   Problem: RH KNOWLEDGE DEFICIT Goal: RH STG INCREASE KNOWLEDGE OF DIABETES Description: Pt/family will be able to demonstrate knowledge of DM management with medications and diet precautions and ways to monitor for hypo/hyperglycemia with cues/reminders assist using handouts/booklets provided by staff Outcome: Progressing Goal: RH STG INCREASE KNOWLEDGE OF HYPERTENSION Description: Pt/family will be able to demonstrate knowledge of HTN management with medications and diet precautions and ways to monitor blood pressure with cues/reminders assist using  handouts/booklets provided by staff Outcome: Progressing Goal: RH STG INCREASE KNOWLEGDE OF HYPERLIPIDEMIA Description: Pt/family will be able to demonstrate knowledge of HLD management with medications and diet precautions with cues/reminders assist using handouts/booklets provided by staff Outcome: Progressing Goal: RH STG INCREASE KNOWLEDGE OF STROKE PROPHYLAXIS Description: Pt/family will be able to demonstrate knowledge of stroke prevention with medications and diet precautions and ways to monitor signs/symptoms of stroke with cues/reminders assist using handouts/booklets provided by staff Outcome: Progressing

## 2020-01-22 NOTE — Progress Notes (Signed)
Occupational Therapy Session Note  Patient Details  Name: Jill Rose MRN: [id number] Date of Birth: August 19, 1940  Today's Date: 01/22/2020 OT Individual Time: 4562-5638 OT Individual Time Calculation (min): 56 min    Short Term Goals: Week 1:  OT Short Term Goal 1 (Week 1): Pt will be able to don pants over feet with min A. OT Short Term Goal 2 (Week 1): Pt will be able to pull pants over hips with CGA. OT Short Term Goal 3 (Week 1): Pt will complete toilet transfer with min A. OT Short Term Goal 4 (Week 1): Pt will be able to write her name with R hand with fair legibility.  Skilled Therapeutic Interventions/Progress Updates:    Pt greeted at time of session sitting up in recliner agreeable to OT session, no c/o pain throughout. Bimanual tasks for IADL of using her phone and picking out clothes from her bag. Ambulated from recliner to bathroom with CGA with RW, cues for clearing L foot and taking larger steps. Pt performed UB/LB bathing at shower level with supervision for UB and CGA/Min for LB, able to hold bar and forward weight shift to reach feet/BLEs. Pt states that she is having 1/2 bath turned into a full bath downstairs and will have bars. Dried off in the same manner and performed UB/LB dressing with CGA for UB and Min for LB to thread feet into pants and don socks with Max A. Pt reported she was tired and stand pivot from bench to wheelchair, set up at sink to blowdry hair and perform oral hygiene, all with supervision. Stand pivot wheelchair to recliner with CGA with RW and set up with alarm on, call bell in reach, all needs met.   Therapy Documentation Precautions:  Precautions Precautions: Fall Restrictions Weight Bearing Restrictions: No    Therapy/Group: Individual Therapy  Viona Gilmore 01/22/2020, 10:06 AM

## 2020-01-23 ENCOUNTER — Inpatient Hospital Stay (HOSPITAL_COMMUNITY): Payer: Medicare HMO | Admitting: Occupational Therapy

## 2020-01-23 ENCOUNTER — Inpatient Hospital Stay (HOSPITAL_COMMUNITY): Payer: Medicare HMO | Admitting: Physical Therapy

## 2020-01-23 ENCOUNTER — Inpatient Hospital Stay (HOSPITAL_COMMUNITY): Payer: Medicare HMO | Admitting: Speech Pathology

## 2020-01-23 DIAGNOSIS — I69354 Hemiplegia and hemiparesis following cerebral infarction affecting left non-dominant side: Principal | ICD-10-CM

## 2020-01-23 LAB — CBC
HCT: 39 % (ref 36.0–46.0)
Hemoglobin: 12.2 g/dL (ref 12.0–15.0)
MCH: 27.6 pg (ref 26.0–34.0)
MCHC: 31.3 g/dL (ref 30.0–36.0)
MCV: 88.2 fL (ref 80.0–100.0)
Platelets: 158 10*3/uL (ref 150–400)
RBC: 4.42 MIL/uL (ref 3.87–5.11)
RDW: 15.5 % (ref 11.5–15.5)
WBC: 8.8 10*3/uL (ref 4.0–10.5)
nRBC: 0 % (ref 0.0–0.2)

## 2020-01-23 LAB — DIFFERENTIAL
Abs Immature Granulocytes: 0.02 10*3/uL (ref 0.00–0.07)
Basophils Absolute: 0.1 10*3/uL (ref 0.0–0.1)
Basophils Relative: 1 %
Eosinophils Absolute: 0.2 10*3/uL (ref 0.0–0.5)
Eosinophils Relative: 3 %
Immature Granulocytes: 0 %
Lymphocytes Relative: 28 %
Lymphs Abs: 2.5 10*3/uL (ref 0.7–4.0)
Monocytes Absolute: 0.6 10*3/uL (ref 0.1–1.0)
Monocytes Relative: 7 %
Neutro Abs: 5.4 10*3/uL (ref 1.7–7.7)
Neutrophils Relative %: 61 %

## 2020-01-23 LAB — GLUCOSE, CAPILLARY
Glucose-Capillary: 121 mg/dL — ABNORMAL HIGH (ref 70–99)
Glucose-Capillary: 107 mg/dL — ABNORMAL HIGH (ref 70–99)
Glucose-Capillary: 149 mg/dL — ABNORMAL HIGH (ref 70–99)
Glucose-Capillary: 152 mg/dL — ABNORMAL HIGH (ref 70–99)

## 2020-01-23 NOTE — Progress Notes (Signed)
Occupational Therapy Weekly Progress Note  Patient Details  Name: Jill Rose MRN: [id number] Date of Birth: [birth year]  Beginning of progress report period: January 15, 2020 End of progress report period: January 23, 2020  Today's Date: 01/23/2020 OT Individual Time: 1100-1204 OT Individual Time Calculation (min): 64 min    Patient has met 3 of 4 short term goals. Pt progressing steadily evidenced by carryover of skilled training in use of AE to complete LB dressing tasks and improvements in static and dynamic standing balance.  Pt exhibits impaired LUE and BLE gross motor coordination which hinders her safety and independence during mobility and functional transfers.  Pt also exhibits limited problem solving skills needing cueing during functional tasks.    Patient continues to demonstrate the following deficits: muscle weakness, impaired timing and sequencing, motor apraxia and decreased coordination, decreased visual perceptual skills, decreased problem solving and decreased safety awareness and decreased sitting balance and decreased standing balance and therefore will continue to benefit from skilled OT intervention to enhance overall performance with BADL.  Patient progressing toward long term goals..  Continue plan of care.  OT Short Term Goals Week 1:  OT Short Term Goal 1 (Week 1): Pt will be able to don pants over feet with min A. OT Short Term Goal 1 - Progress (Week 1): Progressing toward goal OT Short Term Goal 2 (Week 1): Pt will be able to pull pants over hips with CGA. OT Short Term Goal 2 - Progress (Week 1): Met OT Short Term Goal 3 (Week 1): Pt will complete toilet transfer with min A. OT Short Term Goal 3 - Progress (Week 1): Met OT Short Term Goal 4 (Week 1): Pt will be able to write her name with R hand with fair legibility. OT Short Term Goal 4 - Progress (Week 1): Met  Skilled Therapeutic Interventions/Progress Updates:    Pt sitting up in recliner agreeable to taking  shower this AM.  Pt reports her husband had to go to the ED a few days ago because he tripped and hurt his back.  Pt reports he will not be able to come for family education session.  Discussed social support system with pt and she reports her sister, brother, and daughter will be able to assist as needed as well.   Pt doffed shirt and bra needing VCs for compensatory technique to increase independence.  Pt doffed pants and brief with CGA. Pt ambulated using RW to shower bench and lateral step into shower with stand to sit needing CGA and max VCs to improve body mechanics.  Pt bathed UB and LB with min assist for back and bilateral feet and needing increased time to complete.  Pt reports she has long handled brush at home to assist with back.  Pt educated on safety strategies in shower including having someone dry floor surface thoroughly before transferring out.  Pt ambulated back to recliner with CGA cueing to reduce BLE shuffling.  Pt completed UB dressing with SBA.  Pt completed LB dressing with min assist and max VCs needed to cue in use of reacher and sock aide. Pt sitting up in recliner call bell in reach, seat alarm on.    Therapy Documentation Precautions:  Precautions Precautions: Fall Restrictions Weight Bearing Restrictions: No   Therapy/Group: Individual Therapy  Ezekiel Slocumb 01/23/2020, 5:15 PM

## 2020-01-23 NOTE — Plan of Care (Signed)
  Problem: RH SAFETY Goal: RH STG ADHERE TO SAFETY PRECAUTIONS W/ASSISTANCE/DEVICE Description: STG Adhere to Safety Precautions With cues/reminders Assistance/Device. Outcome: Progressing   

## 2020-01-23 NOTE — Progress Notes (Signed)
[place] PHYSICAL MEDICINE & REHABILITATION PROGRESS NOTE  Subjective/Complaints: Patient seen sitting up in her chair this AM.  She states she slept well overnight.  She states she had a good birthday yesterday.  She enjoyed speaking with the dietitian.   ROS: Denies CP, SOB, N/V/D  Objective: Vital Signs: Blood pressure (!) 142/63, pulse 85, temperature 98.5 F (36.9 C), resp. rate 17, height 5' 1.02" (1.55 m), weight 76.8 kg, SpO2 99 %. No results found. No results for input(s): WBC, HGB, HCT, PLT in the last 72 hours. Recent Labs    01/21/20 0123  CREATININE 0.83    Physical Exam: BP (!) 142/63   Pulse 85   Temp 98.5 F (36.9 C)   Resp 17   Ht 5' 1.02" (1.55 m)   Wt 76.8 kg   SpO2 99%   BMI 31.97 kg/m  Constitutional: No distress . Vital signs reviewed. HENT: Normocephalic.  Atraumatic. Eyes: EOMI. No discharge. Cardiovascular: No JVD. Respiratory: Normal effort.  No stridor. GI: Non-distended. Skin: Warm and dry.  Intact. Psych: Normal mood.  Normal behavior. Musc: No edema in extremities.  No tenderness in extremities. Neuro: Alert She makes eye contact with examiner.   Follows simple commands.   Appears to have a fair awareness of deficits. Motor:  LUE: 4+/5 proximal to distal, unchanged LLE: 4+/5 proximal to distal LUE: Mild ataxia, stable  Assessment/Plan: 1. 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  Physiatrist is providing close team supervision and 24 hour management of active medical problems listed below.  Physiatrist and rehab team continue to assess barriers to discharge/monitor patient progress toward functional and medical goals  Care Tool:  Bathing    Body parts bathed by patient: Face, Chest, Abdomen, Right arm, Left arm, Front perineal area, Right upper leg, Left upper leg, Right lower leg, Left lower leg   Body parts  bathed by helper: Buttocks     Bathing assist Assist Level: Minimal Assistance - Patient > 75%     Upper Body Dressing/Undressing Upper body dressing   What is the patient wearing?: Pull over shirt    Upper body assist Assist Level: Supervision/Verbal cueing    Lower Body Dressing/Undressing Lower body dressing      What is the patient wearing?: Underwear/pull up, Pants     Lower body assist Assist for lower body dressing: Minimal Assistance - Patient > 75%     Toileting Toileting    Toileting assist Assist for toileting: Supervision/Verbal cueing     Transfers Chair/bed transfer  Transfers assist     Chair/bed transfer assist level: Supervision/Verbal cueing     Locomotion Ambulation   Ambulation assist      Assist level: Supervision/Verbal cueing Assistive device: Walker-rolling Max distance: 47ft   Walk 10 feet activity   Assist     Assist level: Supervision/Verbal cueing Assistive device: Walker-rolling   Walk 50 feet activity   Assist Walk 50 feet with 2 turns activity did not occur: Safety/medical concerns  Assist level: Supervision/Verbal cueing Assistive device: Walker-rolling    Walk 150 feet activity   Assist Walk 150 feet activity did not occur: Safety/medical concerns         Walk 10 feet on uneven surface  activity   Assist Walk 10 feet on uneven surfaces activity did not occur: Safety/medical concerns         Wheelchair     Assist Will  patient use wheelchair at discharge?: No             Wheelchair 50 feet with 2 turns activity    Assist            Wheelchair 150 feet activity     Assist            Medical Problem List and Plan: 1.  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  Antithrombotics: -DVT/anticoagulation: Lovenox  Creatinine within normal limits on 7/7  CBC within  normal limits on 7/1, labs pending             -antiplatelet therapy: Aspirin 325 mg and Plavix 75 mg daily x3 months then Plavix alone 3. Pain Management: Tylenol as needed 4. Mood: Provide emotional support             -antipsychotic agents: N/A 5. Neuropsych: This patient is capable of making decisions on her own behalf. 6. Skin/Wound Care: Routine skin checks 7. Fluids/Electrolytes/Nutrition: Routine in and outs.  8.  Hypertension.  Norvasc 10 mg daily.     Vitals:   01/22/20 1935 01/23/20 0456  BP: (!) 148/66 (!) 142/63  Pulse: 88 85  Resp: 18 17  Temp: 98 F (36.7 C) 98.5 F (36.9 C)  SpO2: 97% 99%   Cozaar incrased to  to 100mg    Relatively controlled on 7/9 9.  Diabetes mellitus with peripheral neuropathy.  Hemoglobin A1c 7.6.  SSI. Metformin 500 BID PTA. CBG (last 3)  Recent Labs    01/22/20 1623 01/22/20 2103 01/23/20 0555  GLUCAP 118* 124* 121*   Dietitian consult ordered  Metformin 500 daily started on 7/5, increase to twice daily on 7/7  Relatively controlled on 7/9  Monitor with increased mobility 10.  Hypothyroidism.  Synthroid 11.  Hyperlipidemia. Continue Lipitor 12. Obesity: Encourage weight loss 13.  Hyponatremia: Resolved  Sodium 137 on 7/5  Continue to monitor 14.  Elevated BUN: Improved  WNL on 7/5, labs ordered for Monday  Continue to encourage fluids, reminded again  LOS: 9 days A FACE TO FACE EVALUATION WAS PERFORMED  Fateh Kindle Thursday 01/23/2020, 8:11 AM

## 2020-01-23 NOTE — Progress Notes (Signed)
Speech Language Pathology Daily Session Note  Patient Details  Name: Jill Rose MRN: [id number] Date of Birth: [birth year]  Today's Date: 01/23/2020 SLP Individual Time: 1400-1458 SLP Individual Time Calculation (min): 58 min  Short Term Goals: Week 2: SLP Short Term Goal 1 (Week 2): STG=LTG due to remaining length of stay  Skilled Therapeutic Interventions: Pt was seen for skilled ST targeting cognitive goals. SLP facilitated session with a semi-complex monthly calendar/scheduling task, during which pt required Supervision A verbal cues to place appointments and travel dates within it, but later Min A verbal cues for recall and problem solving when interpreting it to schedule other appointments and answer related theoretical questions. During a novel semi-complex card task, pt required Mod A verbal and visual cues for problem solving, recall, and error awareness. Pt left sitting in recliner with alarm set and needs within reach. Continue per current plan of care.          Pain Pain Assessment Pain Scale: 0-10 Pain Score: 0-No pain  Therapy/Group: Individual Therapy  Little Ishikawa 01/23/2020, 7:11 AM

## 2020-01-23 NOTE — Progress Notes (Signed)
Physical Therapy Session Note  Patient Details  Name: Jill Rose MRN: [id number] Date of Birth: [birth year]  Today's Date: 01/23/2020 PT Individual Time: 0835-0930 PT Individual Time Calculation (min): 55 min   Short Term Goals: Week 2:  PT Short Term Goal 1 (Week 2): Patient will complete all bed mobility and rolling with MinA from flat bed PT Short Term Goal 2 (Week 2): Patient to be able to gait train 165f with S and LRAD PT Short Term Goal 3 (Week 2): Patient to be able to perform all functional transfers with S and RW PT Short Term Goal 4 (Week 2): Patient to score at least 35/56 on Berg  Skilled Therapeutic Interventions/Progress Updates:    Patient received in recliner, pleasant but reporting some glute/LBP pain in standing and general fatigue today. Able to perform all functional transfers with S/RW and gait multiple distances up to 174fwith RW (cues for upright posture and step length) but PT limited due to increasing back pain with standing tasks this morning. Stood at sink and brushed teeth/combed air with U UE support and min guard for balance, then went to dayroom via toHoustonn WCNationalWorked on task involving kicking strategically placed yoga blocks, needed Max verbal and tactile cues to keep foot forward after kicking block (to work on improved stride/step length) or not bring other leg up to the foot that kicked the block but unable to get patient to do this activity with correct form today. Tolerated riding Nustep for 6 minutes on resistance 2 with BLEs and improving smoothness of motion and terminal extension of B knees while on machine. Continues to demonstrate short, shuffling pattern with gait with difficulty with carryover for longer step lengths, also significant motor planning impairments with functional tasks. Left up in recliner with all needs met, chair alarm active.   Therapy Documentation Precautions:  Precautions Precautions: Fall Restrictions Weight Bearing  Restrictions: No   Pain: Pain Assessment Pain Scale: 0-10 Pain Score: 0-No pain    Therapy/Group: Individual Therapy   KrWindell NorfolkDPT, PN1   Supplemental Physical Therapist [place]  Pager [phone] Office [phone]  01/23/2020, 12:23 PM

## 2020-01-24 LAB — GLUCOSE, CAPILLARY
Glucose-Capillary: 133 mg/dL — ABNORMAL HIGH (ref 70–99)
Glucose-Capillary: 141 mg/dL — ABNORMAL HIGH (ref 70–99)
Glucose-Capillary: 127 mg/dL — ABNORMAL HIGH (ref 70–99)
Glucose-Capillary: 117 mg/dL — ABNORMAL HIGH (ref 70–99)

## 2020-01-24 NOTE — Progress Notes (Signed)
[place] PHYSICAL MEDICINE & REHABILITATION PROGRESS NOTE  Subjective/Complaints: Patient seen sitting up in bed this morning eating breakfast. She states she slept well overnight.  ROS: Denies CP, SOB, N/V/D  Objective: Vital Signs: Blood pressure (!) 162/72, pulse 92, temperature 98.8 F (37.1 C), temperature source Oral, resp. rate 14, height 5' 1.02" (1.55 m), weight 72.4 kg, SpO2 100 %. No results found. Recent Labs    01/23/20 0954  WBC 8.8  HGB 12.2  HCT 39.0  PLT 158   No results for input(s): NA, K, CL, CO2, GLUCOSE, BUN, CREATININE, CALCIUM in the last 72 hours.  Physical Exam: BP (!) 162/72 (BP Location: Left Arm)   Pulse 92   Temp 98.8 F (37.1 C) (Oral)   Resp 14   Ht 5' 1.02" (1.55 m)   Wt 72.4 kg   SpO2 100%   BMI 30.13 kg/m  Constitutional: No distress . Vital signs reviewed. HENT: Normocephalic.  Atraumatic. Eyes: EOMI. No discharge. Cardiovascular: No JVD. Respiratory: Normal effort.  No stridor. GI: Non-distended. Skin: Warm and dry.  Intact. Psych: Normal mood.  Normal behavior. Musc: No edema in extremities.  No tenderness in extremities. Neuro: Alert She makes eye contact with examiner.   Follows simple commands.   Appears to have a fair awareness of deficits. Motor:  LUE: 4+/5 proximal to distal, stable LLE: 4+/5 proximal to distal LUE: Mild ataxia, stable  Assessment/Plan: 1. 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  Physiatrist is providing close team supervision and 24 hour management of active medical problems listed below.  Physiatrist and rehab team continue to assess barriers to discharge/monitor patient progress toward functional and medical goals  Care Tool:  Bathing    Body parts bathed by patient: Face, Chest, Abdomen, Right arm, Left arm, Front perineal area, Right upper leg, Left upper leg, Buttocks    Body parts bathed by helper: Buttocks     Bathing assist Assist Level: Minimal Assistance - Patient > 75%     Upper Body Dressing/Undressing Upper body dressing   What is the patient wearing?: Pull over shirt, Bra    Upper body assist Assist Level: Supervision/Verbal cueing    Lower Body Dressing/Undressing Lower body dressing      What is the patient wearing?: Underwear/pull up, Pants     Lower body assist Assist for lower body dressing: Minimal Assistance - Patient > 75%     Toileting Toileting    Toileting assist Assist for toileting: Supervision/Verbal cueing     Transfers Chair/bed transfer  Transfers assist     Chair/bed transfer assist level: Supervision/Verbal cueing     Locomotion Ambulation   Ambulation assist      Assist level: Supervision/Verbal cueing Assistive device: Walker-rolling Max distance: 65ft   Walk 10 feet activity   Assist     Assist level: Supervision/Verbal cueing Assistive device: Walker-rolling   Walk 50 feet activity   Assist Walk 50 feet with 2 turns activity did not occur: Safety/medical concerns  Assist level: Supervision/Verbal cueing Assistive device: Walker-rolling    Walk 150 feet activity   Assist Walk 150 feet activity did not occur: Safety/medical concerns         Walk 10 feet on uneven surface  activity   Assist Walk 10 feet on uneven surfaces activity did not occur: Safety/medical concerns         Wheelchair     Assist Will  patient use wheelchair at discharge?: No             Wheelchair 50 feet with 2 turns activity    Assist            Wheelchair 150 feet activity     Assist            Medical Problem List and Plan: 1.  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  Antithrombotics: -DVT/anticoagulation: Lovenox  Creatinine within normal limits on  7/7  CBC within normal limits on 7/9             -antiplatelet therapy: Aspirin 325 mg and Plavix 75 mg daily x3 months then Plavix alone 3. Pain Management: Tylenol as needed 4. Mood: Provide emotional support             -antipsychotic agents: N/A 5. Neuropsych: This patient is capable of making decisions on her own behalf. 6. Skin/Wound Care: Routine skin checks 7. Fluids/Electrolytes/Nutrition: Routine in and outs.  8.  Hypertension.  Norvasc 10 mg daily.     Vitals:   01/23/20 1936 01/24/20 0445  BP: (!) 149/63 (!) 162/72  Pulse: 86 92  Resp: 15 14  Temp:  98.8 F (37.1 C)  SpO2: 99% 100%   Cozaar incrased to  to 100mg    ? Trending up again on 7/10 9.  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  Hemoglobin A1c 7.6.  SSI. Metformin 500 BID PTA. CBG (last 3)  Recent Labs    01/23/20 1632 01/23/20 2058 01/24/20 0636  GLUCAP 149* 152* 133*   Dietitian consult ordered  Metformin 500 daily started on 7/5, increase to twice daily on 7/7  Slightly elevated on 7/10, will consider medication adjustments mild persistent  Monitor with increased mobility 10.  Hypothyroidism.  Synthroid 11.  Hyperlipidemia. Continue Lipitor 12. Obesity: Encourage weight loss 13.  Hyponatremia: Resolved  Sodium 137 on 7/5  Continue to monitor 14.  Elevated BUN: Improved  WNL on 7/5, labs ordered for Monday  Continue to encourage fluids, reminded again  LOS: 10 days A FACE TO FACE EVALUATION WAS PERFORMED  Roselee Tayloe Wednesday 01/24/2020, 9:10 AM

## 2020-01-24 NOTE — Plan of Care (Signed)
  Problem: Consults Goal: RH STROKE PATIENT EDUCATION Description: See Patient Education module for education specifics  Outcome: Progressing Goal: Diabetes Guidelines if Diabetic/Glucose > 140 Description: If diabetic or lab glucose is > 140 mg/dl - Initiate Diabetes/Hyperglycemia Guidelines & Document Interventions  Outcome: Progressing   Problem: RH BLADDER ELIMINATION Goal: RH STG MANAGE BLADDER WITH ASSISTANCE Description: STG Manage Bladder With supervision Assistance Outcome: Progressing   Problem: RH SKIN INTEGRITY Goal: RH STG SKIN FREE OF INFECTION/BREAKDOWN Description: Pt will be free of skin breakdown/infection prior to DC with supervision assist Outcome: Progressing Goal: RH STG MAINTAIN SKIN INTEGRITY WITH ASSISTANCE Description: STG Maintain Skin Integrity With supervision Assistance. Outcome: Progressing   Problem: RH SAFETY Goal: RH STG ADHERE TO SAFETY PRECAUTIONS W/ASSISTANCE/DEVICE Description: STG Adhere to Safety Precautions With cues/reminders Assistance/Device. Outcome: Progressing   Problem: RH KNOWLEDGE DEFICIT Goal: RH STG INCREASE KNOWLEDGE OF DIABETES Description: Pt/family will be able to demonstrate knowledge of DM management with medications and diet precautions and ways to monitor for hypo/hyperglycemia with cues/reminders assist using handouts/booklets provided by staff Outcome: Progressing Goal: RH STG INCREASE KNOWLEDGE OF HYPERTENSION Description: Pt/family will be able to demonstrate knowledge of HTN management with medications and diet precautions and ways to monitor blood pressure with cues/reminders assist using handouts/booklets provided by staff Outcome: Progressing Goal: RH STG INCREASE KNOWLEGDE OF HYPERLIPIDEMIA Description: Pt/family will be able to demonstrate knowledge of HLD management with medications and diet precautions with cues/reminders assist using handouts/booklets provided by staff Outcome: Progressing Goal: RH STG  INCREASE KNOWLEDGE OF STROKE PROPHYLAXIS Description: Pt/family will be able to demonstrate knowledge of stroke prevention with medications and diet precautions and ways to monitor signs/symptoms of stroke with cues/reminders assist using handouts/booklets provided by staff Outcome: Progressing   

## 2020-01-25 ENCOUNTER — Inpatient Hospital Stay (HOSPITAL_COMMUNITY): Payer: Medicare HMO | Admitting: Speech Pathology

## 2020-01-25 ENCOUNTER — Inpatient Hospital Stay (HOSPITAL_COMMUNITY): Payer: Medicare HMO | Admitting: Occupational Therapy

## 2020-01-25 LAB — GLUCOSE, CAPILLARY
Glucose-Capillary: 131 mg/dL — ABNORMAL HIGH (ref 70–99)
Glucose-Capillary: 98 mg/dL (ref 70–99)
Glucose-Capillary: 141 mg/dL — ABNORMAL HIGH (ref 70–99)
Glucose-Capillary: 128 mg/dL — ABNORMAL HIGH (ref 70–99)

## 2020-01-25 MED ORDER — HYDRALAZINE HCL 10 MG PO TABS
10.0000 mg | ORAL_TABLET | Freq: Every day | ORAL | Status: DC
  Administered 2020-01-25 – 2020-01-27 (×3): 10 mg via ORAL
  Filled 2020-01-25 (×4): qty 1

## 2020-01-25 NOTE — Plan of Care (Signed)
  Problem: Consults Goal: RH STROKE PATIENT EDUCATION Description: See Patient Education module for education specifics  Outcome: Progressing Goal: Diabetes Guidelines if Diabetic/Glucose > 140 Description: If diabetic or lab glucose is > 140 mg/dl - Initiate Diabetes/Hyperglycemia Guidelines & Document Interventions  Outcome: Progressing   Problem: RH BLADDER ELIMINATION Goal: RH STG MANAGE BLADDER WITH ASSISTANCE Description: STG Manage Bladder With supervision Assistance Outcome: Progressing   Problem: RH SKIN INTEGRITY Goal: RH STG SKIN FREE OF INFECTION/BREAKDOWN Description: Pt will be free of skin breakdown/infection prior to DC with supervision assist Outcome: Progressing Goal: RH STG MAINTAIN SKIN INTEGRITY WITH ASSISTANCE Description: STG Maintain Skin Integrity With supervision Assistance. Outcome: Progressing   Problem: RH SAFETY Goal: RH STG ADHERE TO SAFETY PRECAUTIONS W/ASSISTANCE/DEVICE Description: STG Adhere to Safety Precautions With cues/reminders Assistance/Device. Outcome: Progressing   Problem: RH KNOWLEDGE DEFICIT Goal: RH STG INCREASE KNOWLEDGE OF DIABETES Description: Pt/family will be able to demonstrate knowledge of DM management with medications and diet precautions and ways to monitor for hypo/hyperglycemia with cues/reminders assist using handouts/booklets provided by staff Outcome: Progressing Goal: RH STG INCREASE KNOWLEDGE OF HYPERTENSION Description: Pt/family will be able to demonstrate knowledge of HTN management with medications and diet precautions and ways to monitor blood pressure with cues/reminders assist using handouts/booklets provided by staff Outcome: Progressing Goal: RH STG INCREASE KNOWLEGDE OF HYPERLIPIDEMIA Description: Pt/family will be able to demonstrate knowledge of HLD management with medications and diet precautions with cues/reminders assist using handouts/booklets provided by staff Outcome: Progressing Goal: RH STG  INCREASE KNOWLEDGE OF STROKE PROPHYLAXIS Description: Pt/family will be able to demonstrate knowledge of stroke prevention with medications and diet precautions and ways to monitor signs/symptoms of stroke with cues/reminders assist using handouts/booklets provided by staff Outcome: Progressing

## 2020-01-25 NOTE — Progress Notes (Signed)
Occupational Therapy Session Note  Patient Details  Name: Jill Rose MRN: [id number] Date of Birth: [birth year]  Today's Date: 01/25/2020 OT Individual Time: 1300-1411 OT Individual Time Calculation (min): 71 min   Short Term Goals: Week 2:  OT Short Term Goal 1 (Week 2): Pt will be able to don pants over feet with min A. OT Short Term Goal 2 (Week 2): Pt will be able to donn/doff socks and shoes using AE with supervision OT Short Term Goal 3 (Week 2): Pt will be able to bathe UB/LB using long handled sponge with CGA.    Skilled Therapeutic Interventions/Progress Updates:    Pt greeted in the recliner with no c/o pain, finishing up lunch. Declining shower, but agreeable to start session by using the toilet. Pt ambulated with RW and Min A into the bathroom, vcs for avoiding obstacles on the Lt side and also to improve upright posture during transfer. CGA for dynamic standing balance while she lowered clothing and when she completed perihygiene post bladder void. Min A to fully elevate pants in the back. She then ambulated with the same assistance to the sink to wash her hands and complete oral care in standing. Pt then returned to the recliner. Worked on Lt UE coordination and visual scanning by participation in small peg puzzle. Pt required vcs to only use her Lt hand to meet task demands. Note that during the 1st puzzle she only placed pegs towards the rightmost aspect of the board, completing half of the puzzle, and then she stated she was finished. Vcs for finishing puzzle on the Lt side by scanning towards the Lt. Pt required min cues for Lt visual field attendance when completing the 2nd puzzle. Openly discussed with pt her Lt inattention and how this impacts her functionally at home, advised her to swivel her head during functional ambulation and during functional tasks. Pt unable to recall carryover of education when OT utilized teach back technique after. At end of session pt remained sitting  up in the recliner with chair alarm set.   Therapy Documentation Precautions:  Precautions Precautions: Fall Restrictions Weight Bearing Restrictions: No Vital Signs: Therapy Vitals Temp: 98.8 F (37.1 C) Temp Source: Oral Pulse Rate: 88 Resp: 18 BP: (!) 139/58 Patient Position (if appropriate): Sitting Oxygen Therapy SpO2: 98 % O2 Device: Room Air Pain: Pain Assessment Pain Scale: 0-10 Pain Score: 0-No pain ADL: ADL Eating: Set up Grooming: Setup Where Assessed-Grooming: Sitting at sink Upper Body Bathing: Setup Where Assessed-Upper Body Bathing: Sitting at sink Lower Body Bathing: Moderate assistance Where Assessed-Lower Body Bathing: Sitting at sink, Standing at sink Upper Body Dressing: Supervision/safety Where Assessed-Upper Body Dressing: Sitting at sink Lower Body Dressing: Moderate assistance Where Assessed-Lower Body Dressing: Sitting at sink, Standing at sink Toileting: Minimal assistance Where Assessed-Toileting: Teacher, adult education: Moderate assistance Toilet Transfer Method: Stand pivot      Therapy/Group: Individual Therapy  Hyatt Capobianco A Arlyn Buerkle 01/25/2020, 3:40 PM

## 2020-01-25 NOTE — Progress Notes (Signed)
Speech Language Pathology Daily Session Note  Patient Details  Name: Jill Rose MRN: [id number] Date of Birth: Dec 04, 1940  Today's Date: 01/25/2020 SLP Individual Time: 1117-1200 SLP Individual Time Calculation (min): 43 min  Short Term Goals: Week 2: SLP Short Term Goal 1 (Week 2): STG=LTG due to remaining length of stay  Skilled Therapeutic Interventions:  Pt was seen for skilled ST targeting cognitive goals.  Pt was seated upright in recliner upon therapist's arrival and was agreeable to participating in treatment session.  SLP facilitated the session with a novel game targeting memory goals and use of memory compensatory strategies, specifically word-picture associations.  Pt required mod-max assist to generate word-picture associations within activity.  She also initially needed max assist to recall associations; however, as task progressed and with repetition of information therapist was able to fade cues to min assist.  Pt generated specific category members during naming portion of task and then recalled 100% of named items after a delay with min assist verbal cues.  Pt was left in recliner with chair alarm set and call bell within reach.  Continue per current plan of care.    Pain Pain Assessment Pain Scale: 0-10 Pain Score: 0-No pain  Therapy/Group: Individual Therapy  Megha Agnes, Melanee Spry 01/25/2020, 12:24 PM

## 2020-01-25 NOTE — Progress Notes (Signed)
[facility] PHYSICAL MEDICINE & REHABILITATION PROGRESS NOTE  Subjective/Complaints: Patient seen sitting up at the edge of her bed this morning.  She states she slept well overnight.  She has questions regarding discharge therapies.  ROS: Denies CP, SOB, N/V/D  Objective: Vital Signs: Blood pressure (!) 166/77, pulse (!) 102, temperature 98.6 F (37 C), temperature source Oral, resp. rate 16, height 5' 1.02" (1.55 m), weight 72.4 kg, SpO2 98 %. No results found. Recent Labs    01/23/20 0954  WBC 8.8  HGB 12.2  HCT 39.0  PLT 158   No results for input(s): NA, K, CL, CO2, GLUCOSE, BUN, CREATININE, CALCIUM in the last 72 hours.  Physical Exam: BP (!) 166/77 (BP Location: Right Arm)   Pulse (!) 102   Temp 98.6 F (37 C) (Oral)   Resp 16   Ht 5' 1.02" (1.55 m)   Wt 72.4 kg   SpO2 98%   BMI 30.13 kg/m  Constitutional: No distress . Vital signs reviewed. HENT: Normocephalic.  Atraumatic. Eyes: EOMI. No discharge. Cardiovascular: No JVD. Respiratory: Normal effort.  No stridor. GI: Non-distended. Skin: Warm and dry.  Intact. Psych: Normal mood.  Normal behavior. Musc: No edema in extremities.  No tenderness in extremities. Neuro: Alert She makes eye contact with examiner.   Follows simple commands.   Appears to have a fair awareness of deficits. Motor:  LUE: 4+/5 proximal to distal, unchanged LLE: 4+/5 proximal to distal LUE: Mild ataxia, stable  Assessment/Plan: 1. 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  Physiatrist is providing close team supervision and 24 hour management of active medical problems listed below.  Physiatrist and rehab team continue to assess barriers to discharge/monitor patient progress toward functional and medical goals  Care Tool:  Bathing    Body parts bathed by patient: Face, Chest, Abdomen, Right arm, Left arm, Front  perineal area, Right upper leg, Left upper leg, Buttocks   Body parts bathed by helper: Buttocks     Bathing assist Assist Level: Minimal Assistance - Patient > 75%     Upper Body Dressing/Undressing Upper body dressing   What is the patient wearing?: Pull over shirt, Bra    Upper body assist Assist Level: Supervision/Verbal cueing    Lower Body Dressing/Undressing Lower body dressing      What is the patient wearing?: Underwear/pull up, Pants     Lower body assist Assist for lower body dressing: Minimal Assistance - Patient > 75%     Toileting Toileting    Toileting assist Assist for toileting: Supervision/Verbal cueing     Transfers Chair/bed transfer  Transfers assist     Chair/bed transfer assist level: Supervision/Verbal cueing     Locomotion Ambulation   Ambulation assist      Assist level: Supervision/Verbal cueing Assistive device: Walker-rolling Max distance: 11ft   Walk 10 feet activity   Assist     Assist level: Supervision/Verbal cueing Assistive device: Walker-rolling   Walk 50 feet activity   Assist Walk 50 feet with 2 turns activity did not occur: Safety/medical concerns  Assist level: Supervision/Verbal cueing Assistive device: Walker-rolling    Walk 150 feet activity   Assist Walk 150 feet activity did not occur: Safety/medical concerns         Walk 10 feet on uneven surface  activity   Assist Walk 10 feet on uneven surfaces activity did not occur: Safety/medical concerns  Wheelchair     Assist Will patient use wheelchair at discharge?: No             Wheelchair 50 feet with 2 turns activity    Assist            Wheelchair 150 feet activity     Assist            Medical Problem List and Plan: 1.  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  Antithrombotics:  -DVT/anticoagulation: Lovenox  Creatinine within normal limits on 7/7  CBC within normal limits on 7/9             -antiplatelet therapy: Aspirin 325 mg and Plavix 75 mg daily x3 months then Plavix alone 3. Pain Management: Tylenol as needed 4. Mood: Provide emotional support             -antipsychotic agents: N/A 5. Neuropsych: This patient is capable of making decisions on her own behalf. 6. Skin/Wound Care: Routine skin checks 7. Fluids/Electrolytes/Nutrition: Routine in and outs.  8.  Hypertension.  Norvasc 10 mg daily.     Vitals:   01/24/20 1922 01/25/20 0625  BP: (!) 133/56 (!) 166/77  Pulse: 88 (!) 102  Resp: 19 16  Temp: 97.9 F (36.6 C) 98.6 F (37 C)  SpO2: 100% 98%   Cozaar incrased to  to 100mg    Elevations, mainly at night, hydralazine 10 added at bedtime on 7/11 9.  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  Hemoglobin A1c 7.6.  SSI. Metformin 500 BID PTA. CBG (last 3)  Recent Labs    01/24/20 1639 01/24/20 2126 01/25/20 0630  GLUCAP 127* 117* 131*   Dietitian consult ordered  Metformin 500 daily started on 7/5, increase to twice daily on 7/7  Relatively controlled on 7/11  Monitor with increased mobility 10.  Hypothyroidism.  Synthroid 11.  Hyperlipidemia. Continue Lipitor 12. Obesity: Encourage weight loss 13.  Hyponatremia: Resolved  Sodium 137 on 7/5  Continue to monitor 14.  Elevated BUN: Improved  WNL on 7/5, labs ordered for tomorrow  Continue to encourage fluids, reminded again  LOS: 11 days A FACE TO FACE EVALUATION WAS PERFORMED  Randeep Biondolillo 9/5 01/25/2020, 8:39 AM

## 2020-01-26 ENCOUNTER — Inpatient Hospital Stay (HOSPITAL_COMMUNITY): Payer: Medicare HMO | Admitting: Physical Therapy

## 2020-01-26 ENCOUNTER — Inpatient Hospital Stay (HOSPITAL_COMMUNITY): Payer: Medicare HMO | Admitting: Speech Pathology

## 2020-01-26 ENCOUNTER — Inpatient Hospital Stay (HOSPITAL_COMMUNITY): Payer: Medicare HMO | Admitting: Occupational Therapy

## 2020-01-26 LAB — GLUCOSE, CAPILLARY
Glucose-Capillary: 110 mg/dL — ABNORMAL HIGH (ref 70–99)
Glucose-Capillary: 82 mg/dL (ref 70–99)
Glucose-Capillary: 160 mg/dL — ABNORMAL HIGH (ref 70–99)
Glucose-Capillary: 131 mg/dL — ABNORMAL HIGH (ref 70–99)

## 2020-01-26 LAB — BASIC METABOLIC PANEL
Anion gap: 11 (ref 5–15)
BUN: 17 mg/dL (ref 8–23)
CO2: 25 mmol/L (ref 22–32)
Calcium: 9.3 mg/dL (ref 8.9–10.3)
Chloride: 101 mmol/L (ref 98–111)
Creatinine, Ser: 0.94 mg/dL (ref 0.44–1.00)
GFR calc Af Amer: >60 mL/min (ref >60)
GFR calc non Af Amer: 59 mL/min — ABNORMAL LOW (ref >60)
Glucose, Bld: 169 mg/dL — ABNORMAL HIGH (ref 70–99)
Potassium: 4 mmol/L (ref 3.5–5.1)
Sodium: 137 mmol/L (ref 135–145)

## 2020-01-26 NOTE — Progress Notes (Signed)
Occupational Therapy Session Note  Patient Details  Name: Jill Rose MRN: [id number] Date of Birth: 03/14/1941  Today's Date: 01/26/2020 OT Individual Time: 1345-1415 OT Individual Time Calculation (min): 30 min    Short Term Goals: Week 2:  OT Short Term Goal 1 (Week 2): Pt will be able to don pants over feet with min A. OT Short Term Goal 2 (Week 2): Pt will be able to donn/doff socks and shoes using AE with supervision OT Short Term Goal 3 (Week 2): Pt will be able to bathe UB/LB using long handled sponge with CGA.  Skilled Therapeutic Interventions/Progress Updates:    Patient seated in recliner, denies pain or need to use the bathroom.  She agrees to complete therapeutic activity for this session.  UB AROM with OH reach and focus on scapular mobility/stability, standing balance and trunk mobility activities, seated balance and core exercises completed with CS, CGA in stance.  Reviewed activities that she can safely complete in a seated position at home.  She remained sitting in recliner at close of session, seat alarm set and call bell/tray table in reach.   Therapy Documentation Precautions:  Precautions Precautions: Fall Restrictions Weight Bearing Restrictions: No   Therapy/Group: Individual Therapy  Barrie Lyme 01/26/2020, 7:44 AM

## 2020-01-26 NOTE — Progress Notes (Signed)
Physical Therapy Session Note  Patient Details  Name: Jill Rose MRN: [id number] Date of Birth: 10-Jul-1941  Today's Date: 01/26/2020 PT Individual Time: 0800-0900 PT Individual Time Calculation (min): 60 min   Short Term Goals: Week 2:  PT Short Term Goal 1 (Week 2): Patient will complete all bed mobility and rolling with MinA from flat bed PT Short Term Goal 2 (Week 2): Patient to be able to gait train 134ft with S and LRAD PT Short Term Goal 3 (Week 2): Patient to be able to perform all functional transfers with S and RW PT Short Term Goal 4 (Week 2): Patient to score at least 35/56 on Berg  Skilled Therapeutic Interventions/Progress Updates:    Pt received seated on toilet, able to have continent BM. Pt is min A for pericare and CGA for toilet transfer to w/c. No complaints of pain this date. Pt is setup A for washing hands, oral hygiene, and washing her face while seated in w/c at the sink. Pt is able to choose her clothing from a bag of clothes. Pt is mod A for donning pants with assist needed to thread LE and min A for donning sports bra and shirt. Pt is min A for sit to stand transfers throughout session. Pt is CGA for standing balance with RW while performing clothing management. Ambulation x 50 ft with RW and CGA. Pt exhibits decreased B step length L>R as well as decreased LLE clearance during gait. Ambulation in // bars using agility ladder for visual cueing for increased B step length and clearance. Pt exhibits fair ability to increase step length and height with use of agility ladder. Marches with ambulation in // bars 2 x 10 ft with BUE support and CGA with focus on increasing B step length and height. Pt exhibits improved gait mechanics during marches. Sidesteps L/R 2 x 10 ft each direction in // bars with BUE support and CGA for balance. Pt exhibits decreased LLE clearance and decreased tolerance for stance on LLE during this task with increased reliance on BUE for support in  standing on L limb. Standing SLS in // bars with BUE support and CGA for balance 3 x 5 sec each for glute strengthening. Pt requests to sit up in recliner at end of session. Stand pivot transfer w/c to recliner with RW and CGA, cues for safe transfer technique. Pt left seated in recliner in room with needs in reach, chair alarm in place at end of session.  Therapy Documentation Precautions:  Precautions Precautions: Fall Restrictions Weight Bearing Restrictions: No    Therapy/Group: Individual Therapy   Peter Congo, PT, DPT  01/26/2020, 11:58 AM

## 2020-01-26 NOTE — Plan of Care (Signed)
Problem: RH BLADDER ELIMINATION Goal: RH STG MANAGE BLADDER WITH ASSISTANCE Description: STG Manage Bladder With supervision Assistance Outcome: Progressing   Problem: RH SKIN INTEGRITY Goal: RH STG SKIN FREE OF INFECTION/BREAKDOWN Description: Pt will be free of skin breakdown/infection prior to DC with supervision assist Outcome: Progressing Goal: RH STG MAINTAIN SKIN INTEGRITY WITH ASSISTANCE Description: STG Maintain Skin Integrity With supervision Assistance. Outcome: Progressing   Problem: RH SAFETY Goal: RH STG ADHERE TO SAFETY PRECAUTIONS W/ASSISTANCE/DEVICE Description: STG Adhere to Safety Precautions With cues/reminders Assistance/Device. Outcome: Progressing   Problem: RH KNOWLEDGE DEFICIT Goal: RH STG INCREASE KNOWLEDGE OF DIABETES Description: Pt/family will be able to demonstrate knowledge of DM management with medications and diet precautions and ways to monitor for hypo/hyperglycemia with cues/reminders assist using handouts/booklets provided by staff Outcome: Progressing Goal: RH STG INCREASE KNOWLEDGE OF HYPERTENSION Description: Pt/family will be able to demonstrate knowledge of HTN management with medications and diet precautions and ways to monitor blood pressure with cues/reminders assist using handouts/booklets provided by staff Outcome: Progressing Goal: RH STG INCREASE KNOWLEGDE OF HYPERLIPIDEMIA Description: Pt/family will be able to demonstrate knowledge of HLD management with medications and diet precautions with cues/reminders assist using handouts/booklets provided by staff Outcome: Progressing Goal: RH STG INCREASE KNOWLEDGE OF STROKE PROPHYLAXIS Description: Pt/family will be able to demonstrate knowledge of stroke prevention with medications and diet precautions and ways to monitor signs/symptoms of stroke with cues/reminders assist using handouts/booklets provided by staff Outcome: Progressing

## 2020-01-26 NOTE — Progress Notes (Signed)
Speech Language Pathology Daily Session Note  Patient Details  Name: Jill Rose MRN: [id number] Date of Birth: 28-Jul-1940  Today's Date: 01/26/2020 SLP Individual Time: 1102-1200 SLP Individual Time Calculation (min): 58 min  Short Term Goals: Week 2: SLP Short Term Goal 1 (Week 2): STG=LTG due to remaining length of stay  Skilled Therapeutic Interventions: Pt was seen for skilled ST targeting cognition. SLp facilitated session with Supervision A verbal cues for problem solving and error awareness during a basic 3-step action card sequencing task. Pt also recreated mildly complex PEG board designs with overall Min A verbal and visual cues for error awareness, Supervision A for problem solving. Pt with noted improvement in ability to self detect and correct functional errors than previous sessions, however increased cueing required for attention to left side of board and picture at times. Pt left pt sitting in recliner with alarm set and needs within reach. Continue per current plan of care.        Pain Pain Assessment Pain Scale: 0-10 Pain Score: 0-No pain  Therapy/Group: Individual Therapy  [facility] 01/26/2020, 7:20 AM

## 2020-01-26 NOTE — Progress Notes (Signed)
[facility] PHYSICAL MEDICINE & REHABILITATION PROGRESS NOTE  Subjective/Complaints:  Pt reports no issues- slept well- denies pain; Bowel and bladder doing well.    ROS:  Pt denies SOB, abd pain, CP, N/V/C/D, and vision changes  Objective: Vital Signs: Blood pressure (!) 128/58, pulse 81, temperature 98.4 F (36.9 C), resp. rate 17, height 5' 1.02" (1.55 m), weight 72.4 kg, SpO2 99 %. No results found. No results for input(s): WBC, HGB, HCT, PLT in the last 72 hours. Recent Labs    01/26/20 0859  NA 137  K 4.0  CL 101  CO2 25  GLUCOSE 169*  BUN 17  CREATININE 0.94  CALCIUM 9.3    Physical Exam: BP (!) 128/58   Pulse 81   Temp 98.4 F (36.9 C)   Resp 17   Ht 5' 1.02" (1.55 m)   Wt 72.4 kg   SpO2 99%   BMI 30.13 kg/m  Constitutional: No distress . Vital signs reviewed. Sitting up at EOB, appropriate, NAD HENT: Normocephalic.  Atraumatic. Eyes: conjugate gaze Cardiovascular: no JVD. Respiratory: no accessory muscle use GI: Non-distended. Skin: Warm and dry.  Intact. Psych: appropriate Musc: No edema in extremities.  No tenderness in extremities. Neuro: Alert She makes eye contact with examiner.   Follows simple commands.   Appears to have a fair awareness of deficits. Motor:  LUE: 4+/5 proximal to distal, unchanged LLE: 4+/5 proximal to distal LUE: Mild ataxia, stable  Assessment/Plan: 1. 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  Physiatrist is providing close team supervision and 24 hour management of active medical problems listed below.  Physiatrist and rehab team continue to assess barriers to discharge/monitor patient progress toward functional and medical goals  Care Tool:  Bathing    Body parts bathed by patient: Face, Chest, Abdomen, Right arm, Left arm, Front perineal area, Right upper leg, Left upper leg, Buttocks   Body parts  bathed by helper: Buttocks     Bathing assist Assist Level: Minimal Assistance - Patient > 75%     Upper Body Dressing/Undressing Upper body dressing   What is the patient wearing?: Pull over shirt, Bra    Upper body assist Assist Level: Supervision/Verbal cueing    Lower Body Dressing/Undressing Lower body dressing      What is the patient wearing?: Underwear/pull up, Pants     Lower body assist Assist for lower body dressing: Minimal Assistance - Patient > 75%     Toileting Toileting    Toileting assist Assist for toileting: Supervision/Verbal cueing     Transfers Chair/bed transfer  Transfers assist     Chair/bed transfer assist level: Contact Guard/Touching assist     Locomotion Ambulation   Ambulation assist      Assist level: Contact Guard/Touching assist Assistive device: Walker-rolling Max distance: 50'   Walk 10 feet activity   Assist     Assist level: Contact Guard/Touching assist Assistive device: Walker-rolling   Walk 50 feet activity   Assist Walk 50 feet with 2 turns activity did not occur: Safety/medical concerns  Assist level: Contact Guard/Touching assist Assistive device: Walker-rolling    Walk 150 feet activity   Assist Walk 150 feet activity did not occur: Safety/medical concerns         Walk 10 feet on uneven surface  activity   Assist Walk 10 feet on uneven surfaces activity did not occur: Safety/medical concerns  Wheelchair     Assist Will patient use wheelchair at discharge?: No             Wheelchair 50 feet with 2 turns activity    Assist            Wheelchair 150 feet activity     Assist            Medical Problem List and Plan: 1.  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  Antithrombotics: -DVT/anticoagulation: Lovenox  Creatinine within normal limits  on 7/7  CBC within normal limits on 7/9             -antiplatelet therapy: Aspirin 325 mg and Plavix 75 mg daily x3 months then Plavix alone 3. Pain Management: Tylenol as needed 4. Mood: Provide emotional support             -antipsychotic agents: N/A 5. Neuropsych: This patient is capable of making decisions on her own behalf. 6. Skin/Wound Care: Routine skin checks 7. Fluids/Electrolytes/Nutrition: Routine in and outs.  8.  Hypertension.  Norvasc 10 mg daily.     Vitals:   01/26/20 1457 01/26/20 1943  BP: (!) 140/56 (!) 128/58  Pulse: 87 81  Resp: 16 17  Temp:  98.4 F (36.9 C)  SpO2: 98% 99%   Cozaar incrased to  to 100mg    Elevations, mainly at night, hydralazine 10 added at bedtime on 7/11  7/12- BP better controlled- max was 140/56 9.  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  Hemoglobin A1c 7.6.  SSI. Metformin 500 BID PTA. CBG (last 3)  Recent Labs    01/26/20 0618 01/26/20 1159 01/26/20 1632  GLUCAP 110* 82 160*   Dietitian consult ordered  Metformin 500 daily started on 7/5, increase to twice daily on 7/7  Relatively controlled on 7/11  7/12- 82-160- con't meds  Monitor with increased mobility 10.  Hypothyroidism.  Synthroid 11.  Hyperlipidemia. Continue Lipitor 12. Obesity: Encourage weight loss 13.  Hyponatremia: Resolved  Sodium 137 on 7/5  Continue to monitor 14.  Elevated BUN: Improved  WNL on 7/5, labs ordered for tomorrow  Continue to encourage fluids, reminded again  7/12- BUN 17 and Cr 0.94- con't  regimen   LOS: 12 days A FACE TO FACE EVALUATION WAS PERFORMED  Estil Vallee 01/26/2020, 8:20 PM

## 2020-01-26 NOTE — Plan of Care (Signed)
  Problem: RH Problem Solving Goal: LTG Patient will demonstrate problem solving for (SLP) Description: LTG:  Patient will demonstrate problem solving for basic/complex daily situations with cues  (SLP) Flowsheets (Taken 01/26/2020 1138) LTG: Patient will demonstrate problem solving for (SLP): (semi complex) Other (comment) Note: Complexity level reduced due to slower than anticipated progress

## 2020-01-26 NOTE — Care Management (Signed)
Patient ID: Jill Rose, adult   DOB: February 17, 1941, 79 y.o.   MRN: [id number] Follow up with patient regarding concerns by daughter. Patient noted a need for clarification of carbohydrate grams-per 1 carb. Dietician consult completed; appreciated information received from dietician.  Plan to review handouts with daughter before discharge and review questions. Highlighted info on handouts that patient had concerns/questions about during review. Pamelia Hoit'

## 2020-01-26 NOTE — Progress Notes (Signed)
Occupational Therapy Session Note  Patient Details  Name: Jill Rose MRN: [id number] Date of Birth: [birth year]  Today's Date: 01/26/2020 OT Individual Time: 2979-8921 OT Individual Time Calculation (min): 60 min    Short Term Goals: Week 2:  OT Short Term Goal 1 (Week 2): Pt will be able to don pants over feet with min A. OT Short Term Goal 2 (Week 2): Pt will be able to donn/doff socks and shoes using AE with supervision OT Short Term Goal 3 (Week 2): Pt will be able to bathe UB/LB using long handled sponge with CGA.  Skilled Therapeutic Interventions/Progress Updates:    Pt sitting up in recliner, no complaints of pain, agreeable to OT session.  Caregiver education completed with pts dtr via phone with pt present.  Discussed OT dc planning and recommendations including DME, AE, and social support levels needed.  Pts dtr reports new bathroom renovations include ADA height toilet with drop down grab bars. Walk in shower will be large enough to fit shower bench or w/c and threshold free.  Pts dtr requesting further pt education on nutrition for DM mgt.  OT made nursing aware of pts need for nutritionist consult. OT educated pt on use of mirror to inspect skin on bottoms of feet.  Pt unable to reach with short handled mirror.  Pt would benefit from long handled mirror use.  Pt completed functional mobility recliner to EOB with RW needing CGA.  Pt completed sit <>supine with mod assist.  Pt returned to recliner and call bell in reach, seat alarm on.  Pt exhibited impaired motor planning during functional mobility with increased shuffling of BLE.    Therapy Documentation Precautions:  Precautions Precautions: Fall Restrictions Weight Bearing Restrictions: No   Therapy/Group: Individual Therapy  Amie Critchley 01/26/2020, 4:57 PM

## 2020-01-27 ENCOUNTER — Inpatient Hospital Stay (HOSPITAL_COMMUNITY): Payer: Medicare HMO | Admitting: Physical Therapy

## 2020-01-27 ENCOUNTER — Inpatient Hospital Stay (HOSPITAL_COMMUNITY): Payer: Medicare HMO | Admitting: Occupational Therapy

## 2020-01-27 ENCOUNTER — Inpatient Hospital Stay (HOSPITAL_COMMUNITY): Payer: Medicare HMO | Admitting: Speech Pathology

## 2020-01-27 LAB — GLUCOSE, CAPILLARY
Glucose-Capillary: 115 mg/dL — ABNORMAL HIGH (ref 70–99)
Glucose-Capillary: 99 mg/dL (ref 70–99)
Glucose-Capillary: 124 mg/dL — ABNORMAL HIGH (ref 70–99)
Glucose-Capillary: 146 mg/dL — ABNORMAL HIGH (ref 70–99)

## 2020-01-27 NOTE — Plan of Care (Signed)
  Problem: Consults Goal: RH STROKE PATIENT EDUCATION Description: See Patient Education module for education specifics  Outcome: Progressing Goal: Diabetes Guidelines if Diabetic/Glucose > 140 Description: If diabetic or lab glucose is > 140 mg/dl - Initiate Diabetes/Hyperglycemia Guidelines & Document Interventions  Outcome: Progressing   Problem: RH BLADDER ELIMINATION Goal: RH STG MANAGE BLADDER WITH ASSISTANCE Description: STG Manage Bladder With supervision Assistance Outcome: Progressing   Problem: RH SKIN INTEGRITY Goal: RH STG SKIN FREE OF INFECTION/BREAKDOWN Description: Pt will be free of skin breakdown/infection prior to DC with supervision assist Outcome: Progressing Goal: RH STG MAINTAIN SKIN INTEGRITY WITH ASSISTANCE Description: STG Maintain Skin Integrity With supervision Assistance. Outcome: Progressing   Problem: RH SAFETY Goal: RH STG ADHERE TO SAFETY PRECAUTIONS W/ASSISTANCE/DEVICE Description: STG Adhere to Safety Precautions With cues/reminders Assistance/Device. Outcome: Progressing   Problem: RH KNOWLEDGE DEFICIT Goal: RH STG INCREASE KNOWLEDGE OF DIABETES Description: Pt/family will be able to demonstrate knowledge of DM management with medications and diet precautions and ways to monitor for hypo/hyperglycemia with cues/reminders assist using handouts/booklets provided by staff Outcome: Progressing Goal: RH STG INCREASE KNOWLEDGE OF HYPERTENSION Description: Pt/family will be able to demonstrate knowledge of HTN management with medications and diet precautions and ways to monitor blood pressure with cues/reminders assist using handouts/booklets provided by staff Outcome: Progressing Goal: RH STG INCREASE KNOWLEGDE OF HYPERLIPIDEMIA Description: Pt/family will be able to demonstrate knowledge of HLD management with medications and diet precautions with cues/reminders assist using handouts/booklets provided by staff Outcome: Progressing Goal: RH STG  INCREASE KNOWLEDGE OF STROKE PROPHYLAXIS Description: Pt/family will be able to demonstrate knowledge of stroke prevention with medications and diet precautions and ways to monitor signs/symptoms of stroke with cues/reminders assist using handouts/booklets provided by staff Outcome: Progressing   

## 2020-01-27 NOTE — Discharge Summary (Addendum)
Physician Discharge Summary  Patient ID: Jill Rose MRN: [id number] DOB/AGE: 11/24/1940 79 y.o.  Admit date: 01/14/2020 Discharge date: 01/29/2020  Discharge Diagnoses:  Principal Problem:   Right middle cerebral artery stroke [facility]) Active Problems:   Elevated BUN   Hyponatremia   Diabetic peripheral neuropathy (HCC)   Labile blood glucose   Labile blood pressure   Hemiparesis affecting left side as late effect of stroke (HCC)   Benign essential HTN   CKD (chronic kidney disease), stage II DVT prophylaxis Hypothyroidism Hyperlipidemia Morbid obesity  Discharged Condition: Stable  Significant Diagnostic Studies: CT ANGIO HEAD W OR WO CONTRAST  Result Date: 01/06/2020 CLINICAL DATA:  Altered mental status. Stroke. Left-sided weakness. EXAM: CT ANGIOGRAPHY HEAD AND NECK TECHNIQUE: Multidetector CT imaging of the head and neck was performed using the standard protocol during bolus administration of intravenous contrast. Multiplanar CT image reconstructions and MIPs were obtained to evaluate the vascular anatomy. Carotid stenosis measurements (when applicable) are obtained utilizing NASCET criteria, using the distal internal carotid diameter as the denominator. CONTRAST:  80mL OMNIPAQUE IOHEXOL 350 MG/ML SOLN COMPARISON:  CT head 01/05/2020.  MRI head and MRA head 01/06/2020 FINDINGS: CTA NECK FINDINGS Aortic arch: 2 vessel arch with bovine branching pattern. Mild atherosclerotic disease in the aortic arch. Proximal great vessels widely patent. Right carotid system: Right common carotid artery widely patent. Medial deviation of the right carotid bifurcation posterior to the pharynx. Atherosclerotic calcification in the proximal right internal carotid artery with 25% diameter stenosis. Left carotid system: Left carotid artery widely patent without stenosis. Mild atherosclerotic disease at the bifurcation. Medial deviation of the carotid bifurcation in the retropharyngeal space. Vertebral  arteries: Right vertebral artery is patent to the basilar with mild scattered atherosclerotic disease Non dominant left vertebral artery with diffuse atherosclerotic disease. There are multiple areas of stenosis in the proximal and mid and distal left vertebral artery which is patent to the basilar. There is moderate to severe stenosis in the distal left vertebral artery at the skull base and in the left V3 and V4 segments. Skeleton: Cervical spondylosis and kyphosis. No acute skeletal abnormality. Other neck: Negative for mass or adenopathy in the neck. Upper chest: Lung apices clear bilaterally. Review of the MIP images confirms the above findings CTA HEAD FINDINGS Anterior circulation: Atherosclerotic calcification in the cavernous carotid bilaterally. Severe stenosis right supraclinoid internal carotid artery and mild to moderate stenosis right cavernous carotid. Left internal carotid artery is patent without stenosis. Hypoplastic right A1 segment. Anterior and middle cerebral arteries are patent bilaterally. Severe stenosis of the superior division of left M1 at the origin. No other significant stenosis in the anterior circulation. Posterior circulation: Severe stenosis in the distal left vertebral artery. Small basilar artery with severe stenosis in the midportion. Fetal origin of the posterior cerebral artery bilaterally. Moderate stenosis of the proximal and mid right posterior cerebral artery. Venous sinuses: Limited venous contrast due to arterial phase scanning. Anatomic variants: None Review of the MIP images confirms the above findings IMPRESSION: 1. Severe intracranial atherosclerotic disease without acute large vessel occlusion. 2. 25% diameter stenosis proximal right internal carotid artery. No significant left carotid stenosis 3. Non dominant left vertebral artery with diffuse atherosclerotic disease and multiple areas of stenosis. Severe stenosis distal left vertebral artery. Right vertebral artery  is disease without significant stenosis. Electronically Signed   By: Marlan Palau M.D.   On: 01/06/2020 14:31   DG Chest 2 View  Result Date: 01/05/2020 CLINICAL DATA:  Acute ischemic stroke.  EXAM: CHEST - 2 VIEW COMPARISON:  10/17/2013 FINDINGS: Lower lung volumes from prior exam. There are patchy opacities at the left greater than right lung base. Mild cardiomegaly. Aortic atherosclerosis. No significant pleural effusion. There may be minimal fluid in the fissures. No pneumothorax. No acute osseous abnormalities are seen. IMPRESSION: 1. Patchy bibasilar opacities, left greater than right, may be atelectasis or pneumonia. 2. Mild cardiomegaly.  Aortic Atherosclerosis (ICD10-I70.0). Electronically Signed   By: Narda Rutherford M.D.   On: 01/05/2020 22:43   CT HEAD WO CONTRAST  Result Date: 01/11/2020 CLINICAL DATA:  Stroke, follow-up EXAM: CT HEAD WITHOUT CONTRAST TECHNIQUE: Contiguous axial images were obtained from the base of the skull through the vertex without intravenous contrast. 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 There is no acute intracranial hemorrhage or mass effect. No new loss of gray-white differentiation. 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 Ventricles are stable in size. Vascular: There is atherosclerotic calcification at the skull base. Skull: Calvarium is unremarkable. Sinuses/Orbits: Layering secretions in the right sphenoid sinus. Other: Mastoid air cells are clear. IMPRESSION: No acute intracranial hemorrhage. Recent small infarcts better seen on MRI. Stable chronic findings detailed above. Electronically Signed   By: Guadlupe Spanish M.D.   On: 01/11/2020 13:21   CT HEAD WO CONTRAST  Result Date: 01/05/2020 CLINICAL  DATA:  Increased left leg weakness. EXAM: CT HEAD WITHOUT CONTRAST TECHNIQUE: Contiguous axial images were obtained from the base of the skull through the vertex without intravenous contrast. COMPARISON:  January 26, 2017 FINDINGS: Brain: There is mild cerebral atrophy with widening of the extra-axial spaces and ventricular dilatation. There are areas of decreased attenuation within the white matter tracts of the supratentorial brain, consistent with microvascular disease changes. Small chronic left basal ganglia lacunar infarcts are noted. Vascular: No hyperdense vessel or unexpected calcification. Skull: Normal. Negative for fracture or focal lesion. Sinuses/Orbits: There is mild sphenoid sinus mucosal thickening. Other: None. IMPRESSION: 1. Generalized cerebral atrophy. 2. Small chronic left basal ganglia lacunar infarcts. 3. No acute intracranial abnormality. Electronically Signed   By: Aram Candela M.D.   On: 01/05/2020 15:42   CT ANGIO NECK W OR WO CONTRAST  Result Date: 01/06/2020 CLINICAL DATA:  Altered mental status. Stroke. Left-sided weakness. EXAM: CT ANGIOGRAPHY HEAD AND NECK TECHNIQUE: Multidetector CT imaging of the head and neck was performed using the standard protocol during bolus administration of intravenous contrast. Multiplanar CT image reconstructions and MIPs were obtained to evaluate the vascular anatomy. Carotid stenosis measurements (when applicable) are obtained utilizing NASCET criteria, using the distal internal carotid diameter as the denominator. CONTRAST:  45mL OMNIPAQUE IOHEXOL 350 MG/ML SOLN COMPARISON:  CT head 01/05/2020.  MRI head and MRA head 01/06/2020 FINDINGS: CTA NECK FINDINGS Aortic arch: 2 vessel arch with bovine branching pattern. Mild atherosclerotic disease in the aortic arch. Proximal great vessels widely patent. Right carotid system: Right common carotid artery widely patent. Medial deviation of the right carotid bifurcation posterior to the pharynx.  Atherosclerotic calcification in the proximal right internal carotid artery with 25% diameter stenosis. Left carotid system: Left carotid artery widely patent without stenosis. Mild atherosclerotic disease at the bifurcation. Medial deviation of the carotid bifurcation in the retropharyngeal space. Vertebral arteries: Right vertebral artery is patent to the basilar with mild scattered atherosclerotic disease Non  dominant left vertebral artery with diffuse atherosclerotic disease. There are multiple areas of stenosis in the proximal and mid and distal left vertebral artery which is patent to the basilar. There is moderate to severe stenosis in the distal left vertebral artery at the skull base and in the left V3 and V4 segments. Skeleton: Cervical spondylosis and kyphosis. No acute skeletal abnormality. Other neck: Negative for mass or adenopathy in the neck. Upper chest: Lung apices clear bilaterally. Review of the MIP images confirms the above findings CTA HEAD FINDINGS Anterior circulation: Atherosclerotic calcification in the cavernous carotid bilaterally. Severe stenosis right supraclinoid internal carotid artery and mild to moderate stenosis right cavernous carotid. Left internal carotid artery is patent without stenosis. Hypoplastic right A1 segment. Anterior and middle cerebral arteries are patent bilaterally. Severe stenosis of the superior division of left M1 at the origin. No other significant stenosis in the anterior circulation. Posterior circulation: Severe stenosis in the distal left vertebral artery. Small basilar artery with severe stenosis in the midportion. Fetal origin of the posterior cerebral artery bilaterally. Moderate stenosis of the proximal and mid right posterior cerebral artery. Venous sinuses: Limited venous contrast due to arterial phase scanning. Anatomic variants: None Review of the MIP images confirms the above findings IMPRESSION: 1. Severe intracranial atherosclerotic disease  without acute large vessel occlusion. 2. 25% diameter stenosis proximal right internal carotid artery. No significant left carotid stenosis 3. Non dominant left vertebral artery with diffuse atherosclerotic disease and multiple areas of stenosis. Severe stenosis distal left vertebral artery. Right vertebral artery is disease without significant stenosis. Electronically Signed   By: Marlan Palau M.D.   On: 01/06/2020 14:31   MR ANGIO HEAD WO CONTRAST  Result Date: 01/06/2020 CLINICAL DATA:  Infarcts on MRI brain EXAM: MRA HEAD WITHOUT CONTRAST TECHNIQUE: Angiographic images of the Circle of Willis were obtained using MRA technique without intravenous contrast. COMPARISON:  None. FINDINGS: Motion artifact is present. Intracranial internal carotid arteries are patent with atherosclerotic irregularity and at least moderate stenosis of the supraclinoid portion. Middle and anterior cerebral arteries are patent. Right A1 ACA is congenitally small or stenotic. Proximal left M2 MCA moderate to marked stenosis. Intracranial vertebral arteries, basilar artery, posterior cerebral arteries are patent. Moderate to marked stenosis of the mid basilar. Bilateral PCA atherosclerotic irregularity with right P2 stenosis. Bilateral posterior communicating arteries are present. There is no aneurysm. IMPRESSION: No proximal intracranial vessel occlusion. Multifocal atherosclerotic irregularity and stenoses. Electronically Signed   By: Guadlupe Spanish M.D.   On: 01/06/2020 11:20   MR BRAIN WO CONTRAST  Result Date: 01/06/2020 CLINICAL DATA:  Left-sided weakness over the last week. EXAM: MRI HEAD WITHOUT CONTRAST TECHNIQUE: Multiplanar, multiecho pulse sequences of the brain and surrounding structures were obtained without intravenous contrast. COMPARISON:  CT head without contrast 01/05/2020 FINDINGS: Brain: Arcs the diffusion-weighted images demonstrate acute/subacute nonhemorrhagic infarcts involving the right corona radiata.  Additional subcortical white matter infarcts are present in the posterior right temporal and right occipital lobes. Minimal cortical involvement is present. Extensive white matter disease is present bilaterally. Remote lacunar infarcts are present within the corona radiata bilaterally. Remote lacunar infarcts are also present in the left lentiform nucleus. Remote lacunar infarct is present in the left thalamus. Remote white matter infarcts are present adjacent to the atrium of the right lateral ventricle. A remote lacunar infarct is present in the left paramedian pons. White matter changes extend through the brainstem. Remote lacunar infarcts are present in the posteroinferior right cerebellum. The ventricles are  proportionate to the degree of atrophy. No significant extraaxial fluid collection is present. The internal auditory canals are within normal limits. Vascular: Flow is present in the major intracranial arteries. Skull and upper cervical spine: Mild degenerative changes are present in the upper cervical spine. Craniocervical junction is normal. Marrow signal is normal. Sinuses/Orbits: A fluid level is present in the right sphenoid sinus. The paranasal sinuses and mastoid air cells are otherwise clear. Bilateral lens replacements are noted. Globes and orbits are otherwise unremarkable. IMPRESSION: 1. Acute/subacute nonhemorrhagic infarcts involving the right corona radiata and posterior right temporal and occipital lobes. Findings correspond to the subacute time frame of left-sided weakness. 2. Remote lacunar infarcts of the corona radiata bilaterally, left thalamus, and left paramedian pons. 3. Extensive white matter disease likely reflects the sequela of chronic microvascular ischemia. 4. Right sphenoid sinus disease. Electronically Signed   By: Marin Roberts M.D.   On: 01/06/2020 05:33   ECHOCARDIOGRAM COMPLETE  Result Date: 01/06/2020    ECHOCARDIOGRAM REPORT   Patient Name:   Jill Rose  Date of Exam: 01/06/2020 Medical Rec #:  [id number]      Height:       61.0 in Accession #:    [id number]     Weight:       174.4 lb Date of Birth:  14-Nov-1940       BSA:          1.782 m Patient Age:    78 years       BP:           179/70 mmHg Patient Gender: F              HR:           88 bpm. Exam Location:  Inpatient Procedure: 2D Echo Indications:     Stroke 434.91 / I163.9  History:         Patient has prior history of Echocardiogram examinations, most                  recent 01/28/2017. TIA; Risk Factors:Hypertension and Diabetes.                  Thyroid disease.  Sonographer:     Leta Jungling RDCS Referring Phys:  [id number] [place] Diagnosing Phys: Armanda Magic MD  Sonographer Comments: No IV access. IMPRESSIONS  1. There is a mid cavitary resting gradient of due to hyperdynamic LVF.     Marland Kitchen Left ventricular ejection fraction, by estimation, is 65 to 70%. The left ventricle has normal function. The left ventricle has no regional wall motion abnormalities. Left ventricular diastolic parameters are consistent with Grade I diastolic dysfunction (impaired relaxation). Elevated left ventricular end-diastolic pressure.  2. Right ventricular systolic function is normal. The right ventricular size is normal. Tricuspid regurgitation signal is inadequate for assessing PA pressure.  3. The mitral valve is normal in structure. No evidence of mitral valve regurgitation. Mild mitral stenosis. The mean mitral valve gradient is 6.5 mmHg.  4. The aortic valve is tricuspid. Aortic valve regurgitation is not visualized. Mild to moderate aortic valve sclerosis/calcification is present, without any evidence of aortic stenosis. Aortic valve mean gradient measures 9.0 mmHg. Aortic valve Vmax measures 1.97 m/s.  5. Aortic dilatation noted. 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  6. The inferior vena cava is normal in size with greater than 50% respiratory variability, suggesting right atrial  pressure of 3 mmHg. FINDINGS  Left Ventricle: There is a mid cavitary resting gradient of due to hyperdynamic LVF. Left ventricular ejection fraction, by estimation, is 65 to 70%. The left ventricle has normal function. The left ventricle has no regional wall motion abnormalities. The left ventricular internal cavity size was normal in size. There is no left ventricular hypertrophy. Left ventricular diastolic parameters are consistent with Grade I diastolic dysfunction (impaired relaxation). Elevated left ventricular end-diastolic pressure. Right Ventricle: The right ventricular size is normal. No increase in right ventricular wall thickness. Right ventricular systolic function is normal. Tricuspid regurgitation signal is inadequate for assessing PA pressure. Left Atrium: Left atrial size was normal in size. Right Atrium: Right atrial size was normal in size. Pericardium: There is no evidence of pericardial effusion. Mitral Valve: The mitral valve is normal in structure. Normal mobility of the mitral valve leaflets. Severe mitral annular calcification. No evidence of mitral valve regurgitation. Mild mitral valve stenosis. MV peak gradient, 18.6 mmHg. The mean mitral valve gradient is 6.5 mmHg. Tricuspid Valve: The tricuspid valve is normal in structure. Tricuspid valve regurgitation is not demonstrated. No evidence of tricuspid stenosis. Aortic Valve: The aortic valve is tricuspid. Aortic valve regurgitation is not visualized. Mild to moderate aortic valve sclerosis/calcification is present, without any evidence of aortic stenosis. Aortic valve mean gradient measures 9.0 mmHg. Aortic valve peak gradient measures 15.5 mmHg. Aortic valve area, by VTI measures 1.06 cm. Pulmonic Valve: The pulmonic valve was normal in structure. Pulmonic valve regurgitation is trivial. No evidence of pulmonic stenosis. Aorta: Aortic dilatation noted. 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 Venous: The  inferior vena cava was not well visualized. The inferior vena cava is normal in size with greater than 50% respiratory variability, suggesting right atrial pressure of 3 mmHg. IAS/Shunts: No atrial level shunt detected by color flow Doppler.  LEFT VENTRICLE PLAX 2D LVIDd:         3.80 cm  Diastology LVIDs:         2.60 cm  LV e' lateral:   4.46 cm/s LV PW:         1.00 cm  LV E/e' lateral: 26.2 LV IVS:        1.10 cm  LV e' medial:    4.13 cm/s LVOT diam:     1.60 cm  LV E/e' medial:  28.3 LV SV:         38 LV SV Index:   21 LVOT Area:     2.01 cm  RIGHT VENTRICLE TAPSE (M-mode): 1.6 cm LEFT ATRIUM             Index LA diam:        3.10 cm 1.74 cm/m LA Vol (A2C):   30.1 ml 16.89 ml/m LA Vol (A4C):   29.3 ml 16.44 ml/m LA Biplane Vol: 34.9 ml 19.59 ml/m  AORTIC VALVE AV Area (Vmax):    1.30 cm AV Area (Vmean):   1.34 cm AV Area (VTI):     1.06 cm AV Vmax:           197.00 cm/s AV Vmean:          135.000 cm/s AV VTI:            0.355 m AV Peak Grad:      15.5 mmHg AV Mean Grad:      9.0 mmHg LVOT Vmax:         127.00 cm/s LVOT Vmean:  89.900 cm/s LVOT VTI:          0.187 m LVOT/AV VTI ratio: 0.53  AORTA Ao Root diam: 3.40 cm MITRAL VALVE MV Area (PHT): 2.48 cm     SHUNTS MV Peak grad:  18.6 mmHg    Systemic VTI:  0.19 m MV Mean grad:  6.5 mmHg     Systemic Diam: 1.60 cm MV Vmax:       2.16 m/s MV Vmean:      117.5 cm/s MV Decel Time: 306 msec MV E velocity: 117.00 cm/s MV A velocity: 209.00 cm/s MV E/A ratio:  0.56 Armanda Magic MD Electronically signed by Armanda Magic MD Signature Date/Time: 01/06/2020/8:59:43 AM    Final (Updated)     Labs:  Basic Metabolic Panel: Recent Labs  Lab 01/26/20 0859 01/28/20 0729  NA 137  --   K 4.0  --   CL 101  --   CO2 25  --   GLUCOSE 169*  --   BUN 17  --   CREATININE 0.94 0.75  CALCIUM 9.3  --     CBC: Recent Labs  Lab 01/23/20 0954  WBC 8.8  NEUTROABS 5.4  HGB 12.2  HCT 39.0  MCV 88.2  PLT 158    CBG: Recent Labs  Lab 01/27/20 2100  01/28/20 0605 01/28/20 1207 01/28/20 1625 01/28/20 2052  GLUCAP 146* 109* 107* 133* 104*   Family history.  Mother with CAD.  Denies any colon cancer esophageal cancer or rectal cancer  Brief HPI:   Jill Rose is a 78 y.o. right-handed adult with history of diet-controlled diabetes mellitus, hypertension, hypothyroidism, TIA on aspirin.  Patient lives with spouse two-level home independent prior to admission.  Presented 01/05/2020 left side weakness.  CT/MRI showed acute subacute nonhemorrhagic infarction involving the right corona radiata and right posterior temporal and occipital lobes.  Remote lacunar infarct of the corona radiata bilaterally, left thalamus and left paramedian pons.  Patient did not receive TPA.  MRA with no proximal intracranial vessel occlusion.  CT angiogram of head and neck showed severe intracranial atherosclerotic disease without acute large vessel occlusion.  25% diameter stenosis proximal right ICA.  No significant left carotid stenosis.  Echocardiogram with ejection fraction of 70% without any wall motion abnormalities.  Admission chemistry sodium 132 potassium 5.3 glucose 135.  Neurology consulted maintain on aspirin and Plavix x3 months then Plavix alone.  Subcutaneous Lovenox for DVT prophylaxis.  Patient was admitted for a comprehensive rehab program   Hospital Course: Jill Rose was admitted to rehab 01/14/2020 for inpatient therapies to consist of PT, ST and OT at least three hours five days a week. Past admission physiatrist, therapy team and rehab RN have worked together to provide customized collaborative inpatient rehab.  Pertaining to patient's right MCA infarction felt to be thromboembolic secondary small vessel disease and large vessel disease.  Maintain on aspirin Plavix x3 months then Plavix alone.  Subcutaneous Lovenox for DVT prophylaxis.  Patient would follow neurology services.  Blood pressure controlled on Norvasc as well as hydralazine with Cozaar  and follow-up primary MD.  Blood sugars controlled hemoglobin A1c 7.6 patient continued on Glucophage.  Hormone supplement ongoing for hypothyroidism.  Lipitor for hyperlipidemia.  Noted obesity BMI 30.13 with dietary follow-up.   Blood pressures were monitored on TID basis and controlled  Diabetes has been monitored with ac/hs CBG checks and SSI was use prn for tighter BS control.    Rehab course: During patient's stay in rehab  weekly team conferences were held to monitor patient's progress, set goals and discuss barriers to discharge. At admission, patient required minimal assist 55 feet rolling walker moderate assist sit stand minimal assist stand pivot transfers.  Minimal assist upper body bathing mod assist lower body bathing set up upper body dressing moderate assist lower body dressing  Physical exam.  Blood pressure 151/59 pulse 84 temperature 98.3 respiration 17 oxygen saturation 97% room air Constitutional.  Alert and oriented no acute distress HEENT Head.  Normocephalic and atraumatic Eyes.  Pupils round and reactive to light no discharge.nystagmus Neck.  Supple nontender no JVD without thyromegaly Cardiac regular rate rhythm without any extra sounds or murmur heard Respiratory effort normal no respiratory distress without wheeze Abdomen.  Soft nontender positive bowel sounds without rebound Musculoskeletal no edema or tenderness in extremities Skin.  Warm and dry Neurological.  Alert no acute distress cooperative with exam oriented x3 Motor.  Right upper extremity/right lower extremity 5/5 proximal to distal Left upper extremity 4+/5 proximal to distal Left lower extremity 4 - 4+/5 proximal to distal Left upper extremity mild ataxia  /She  has had improvement in activity tolerance, balance, postural control as well as ability to compensate for deficits. Francis Dowse has had improvement in functional use RUE/LUE  and RLE/LLE as well as improvement in awareness.  Patient is minimal  assist for pericare and contact-guard for toilet transfers to wheelchair.  Patient is set up for washing hands oral hygiene and washing her face while seated.  Ambulates 50 feet rolling walker contact-guard assist.  Patient is contact-guard for standing balance with rolling walker.  Patient completed functional ability recliner to edge of bed rolling walker needing contact-guard assist for ADLs completed sit to supine with moderate assist.  Full family teaching was completed plan discharge to home       Disposition: Discharged home    Diet: Carb modified  Special Instructions: No driving smoking or alcohol  Continue aspirin Plavix x3 months then Plavix alone  Medications at discharge 1.  Tylenol as needed 2.  Norvasc 10 mg p.o. daily 3.  Aspirin 3 and 25 mg p.o. daily 4.  Lipitor 80 mg p.o. daily 5.  Plavix 75 mg p.o. daily 6.  Hydralazine 10 mg p.o. nightly 7.  Synthroid 88 mcg p.o. daily 8.  Cozaar 100 mg p.o. daily 9.  Glucophage 500 mg p.o. twice daily  30-35 minutes were spent completing discharge summary and discharge planning  Discharge Instructions     Ambulatory referral to Neurology   Complete by: As directed    An appointment is requested in approximately 4 weeks right MCA infarction   Ambulatory referral to Physical Medicine Rehab   Complete by: As directed    Moderate complexity follow-up 1 to 2 weeks right MCA infarction        Follow-up Information     [name], MD Follow up.   Specialty: Physical Medicine and Rehabilitation Why: Office to call for appointment Contact information: [address] [place] [address] [place] [address] [phone]                 Signed: Charlton Amor 01/29/2020, 4:47 AM Patient was seen, face-face, and physical exam performed by me on day of discharge, greater than 30 minutes of total time spent.. Please see progress note from day of discharge as well.  Maryla Morrow, MD, ABPMR

## 2020-01-27 NOTE — Progress Notes (Signed)
Patient ID: Jill Rose, adult   DOB: [birth year], 79 y.o.   MRN: [id number]   Daughter added to visitation list by nursing for family education and discharge.

## 2020-01-27 NOTE — Progress Notes (Signed)
Speech Language Pathology Daily Session Note  Patient Details  Name: Jill Rose MRN: [id number] Date of Birth: 02/15/1941  Today's Date: 01/27/2020 SLP Individual Time: 1000-1058 SLP Individual Time Calculation (min): 58 min  Short Term Goals: Week 2: SLP Short Term Goal 1 (Week 2): STG=LTG due to remaining length of stay  Skilled Therapeutic Interventions: Pt was seen for skilled ST targeting cognition. She required extra time and Min faded to supervision A for recall and problem solving within a complex deductive reasoning task. Given that pt also uses her iPad to search recipes and other means of research on YouTube, SLP provided Mod faded to Supervision A verbal cues through teach back method for functional problem solving and recall when searching iPad for recipes. Pt left sitting in chair with alarm set and needs within reach. Continue per current plan of care.        Pain Pain Assessment Pain Scale: 0-10 Pain Score: 0-No pain  Therapy/Group: Individual Therapy  Little Ishikawa 01/27/2020, 7:24 AM

## 2020-01-27 NOTE — Progress Notes (Signed)
Occupational Therapy Session Note  Patient Details  Name: Jill Rose MRN: [id number] Date of Birth: 08/30/1940  Today's Date: 01/27/2020 OT Individual Time: 6503-5465 OT Individual Time Calculation (min): 80 min    Short Term Goals: Week 2:  OT Short Term Goal 1 (Week 2): Pt will be able to don pants over feet with min A. OT Short Term Goal 2 (Week 2): Pt will be able to donn/doff socks and shoes using AE with supervision OT Short Term Goal 3 (Week 2): Pt will be able to bathe UB/LB using long handled sponge with CGA.  Skilled Therapeutic Interventions/Progress Updates:    Pt up in recliner agreeable to OT session.  Pt worked on AE training during LB dressing at beginning of session to donn/doff socks and pants using reacher and sock aide.  Pt needing mod intermittent VCs and visual demonstration to increase independence and pt able to return demonstrate with step by step cueing.  Pt complete functional transfer training using RW for functional ambulation and sit<>stand transfers x 2 at recliner.  Pt provided visual cues to increase step length and reduce BLE shuffle.  Pt also needed step by step VCs to improve motor planning during pivoting for obstacle negotiation and approaching seat.  Pt intermittently able to carryover motor skills however not consistently.  Pt participated in gross motor coordination task using BUE in sitting to bounce and toss ball with visual target provided.  OT added cognitive component to increase multitasking demands with pt intermittently having to stop gross motor portion to answer orientation and memory questions.  Call bell in reach, seat alarm on.  Therapy Documentation Precautions:  Precautions Precautions: Fall Restrictions Weight Bearing Restrictions: No   Therapy/Group: Individual Therapy  Amie Critchley 01/27/2020, 5:44 PM

## 2020-01-27 NOTE — Progress Notes (Signed)
[place] PHYSICAL MEDICINE & REHABILITATION PROGRESS NOTE  Subjective/Complaints: Patient seen sitting up at the edge of her bed this morning.  Good sitting balance noted.  She states she slept well overnight.  She is questions regarding discharge therapies.  ROS: Denies CP, SOB, N/V/D  Objective: Vital Signs: Blood pressure (!) 129/57, pulse 84, temperature 98.4 F (36.9 C), temperature source Oral, resp. rate 17, height 5' 1.02" (1.55 m), weight 72.4 kg, SpO2 96 %. No results found. No results for input(s): WBC, HGB, HCT, PLT in the last 72 hours. Recent Labs    01/26/20 0859  NA 137  K 4.0  CL 101  CO2 25  GLUCOSE 169*  BUN 17  CREATININE 0.94  CALCIUM 9.3    Physical Exam: BP (!) 129/57   Pulse 84   Temp 98.4 F (36.9 C) (Oral)   Resp 17   Ht 5' 1.02" (1.55 m)   Wt 72.4 kg   SpO2 96%   BMI 30.13 kg/m  Constitutional: No distress . Vital signs reviewed. HENT: Normocephalic.  Atraumatic. Eyes: EOMI. No discharge. Cardiovascular: No JVD.  RRR. Respiratory: Normal effort.  No stridor.  Bilaterally clear to auscultation. GI: Non-distended.  BS +. Skin: Warm and dry.  Intact. Psych: Normal mood.  Normal behavior. Musc: No edema in extremities.  No tenderness in extremities. Neuro: Alert She makes eye contact with examiner.   Follows simple commands.   Good awareness of deficits. Motor:  LUE: 4+/5 proximal to distal, unchanged LLE: 4+/5 proximal to distal LUE: Mild ataxia, unchanged  Assessment/Plan: 1. 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  Physiatrist is providing close team supervision and 24 hour management of active medical problems listed below.  Physiatrist and rehab team continue to assess barriers to discharge/monitor patient progress toward functional and medical goals  Care Tool:  Bathing    Body parts bathed by patient:  Face, Chest, Abdomen, Right arm, Left arm, Front perineal area, Right upper leg, Left upper leg, Buttocks   Body parts bathed by helper: Buttocks     Bathing assist Assist Level: Minimal Assistance - Patient > 75%     Upper Body Dressing/Undressing Upper body dressing   What is the patient wearing?: Pull over shirt, Bra    Upper body assist Assist Level: Supervision/Verbal cueing    Lower Body Dressing/Undressing Lower body dressing      What is the patient wearing?: Underwear/pull up, Pants     Lower body assist Assist for lower body dressing: Minimal Assistance - Patient > 75%     Toileting Toileting    Toileting assist Assist for toileting: Supervision/Verbal cueing     Transfers Chair/bed transfer  Transfers assist     Chair/bed transfer assist level: Contact Guard/Touching assist     Locomotion Ambulation   Ambulation assist      Assist level: Contact Guard/Touching assist Assistive device: Walker-rolling Max distance: 50'   Walk 10 feet activity   Assist     Assist level: Contact Guard/Touching assist Assistive device: Walker-rolling   Walk 50 feet activity   Assist Walk 50 feet with 2 turns activity did not occur: Safety/medical concerns  Assist level: Contact Guard/Touching assist Assistive device: Walker-rolling    Walk 150 feet activity   Assist Walk 150 feet activity did not occur: Safety/medical concerns         Walk 10 feet on uneven surface  activity  Assist Walk 10 feet on uneven surfaces activity did not occur: Safety/medical concerns         Wheelchair     Assist Will patient use wheelchair at discharge?: No             Wheelchair 50 feet with 2 turns activity    Assist            Wheelchair 150 feet activity     Assist            Medical Problem List and Plan: 1.  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  Antithrombotics: -DVT/anticoagulation: Lovenox  CBC within normal limits on 7/9             -antiplatelet therapy: Aspirin 325 mg and Plavix 75 mg daily x3 months then Plavix alone 3. Pain Management: Tylenol as needed 4. Mood: Provide emotional support             -antipsychotic agents: N/A 5. Neuropsych: This patient is capable of making decisions on her own behalf. 6. Skin/Wound Care: Routine skin checks 7. Fluids/Electrolytes/Nutrition: Routine in and outs.   BMP within normal limits, except for glucose on 7/12 8.  Hypertension.  Norvasc 10 mg daily.     Vitals:   01/26/20 1943 01/27/20 0500  BP: (!) 128/58 (!) 129/57  Pulse: 81 84  Resp: 17 17  Temp: 98.4 F (36.9 C) 98.4 F (36.9 C)  SpO2: 99% 96%   Cozaar incrased to  to 100mg    Elevations, mainly at night, hydralazine 10 added at bedtime on 7/11  Controlled on 7/12 9.  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  Hemoglobin A1c 7.6.  SSI. Metformin 500 BID PTA. CBG (last 3)  Recent Labs    01/26/20 1632 01/26/20 2111 01/27/20 0626  GLUCAP 160* 131* 115*   Dietitian consult ordered  Metformin 500 daily started on 7/5, increase to twice daily on 7/7  Relatively controlled on 7/12  Monitor with increased mobility 10.  Hypothyroidism.  Synthroid 11.  Hyperlipidemia.  Continue Lipitor 12. Obesity: Encourage weight loss 13.  Hyponatremia: Resolved  Continue to monitor 14.  Elevated BUN: Improved  WNL on 7/12  Continue to encourage fluids, reminded again  LOS: 13 days A FACE TO FACE EVALUATION WAS PERFORMED  Avelino Herren 9/12 01/27/2020, 8:01 AM

## 2020-01-27 NOTE — Progress Notes (Signed)
Physical Therapy Session Note  Patient Details  Name: Jill Rose MRN: [id number] Date of Birth: 06-Feb-1941  Today's Date: 01/27/2020 PT Individual Time: 0805-0900 PT Individual Time Calculation (min): 55 min   Short Term Goals: Week 2:  PT Short Term Goal 1 (Week 2): Patient will complete all bed mobility and rolling with MinA from flat bed PT Short Term Goal 2 (Week 2): Patient to be able to gait train 174f with S and LRAD PT Short Term Goal 3 (Week 2): Patient to be able to perform all functional transfers with S and RW PT Short Term Goal 4 (Week 2): Patient to score at least 35/56 on Berg  Skilled Therapeutic Interventions/Progress Updates: Pt presented sitting EOB agreeable to therapy. Pt denies pain during session. Pt noted to still be in gown. Performed STS from EOB with close S and ambulated across room with RW and CGA with shortened step length and decreased R foot clearance to gather clothes. Pt performed reaching task to gather clothes from chair and then ambulated back to bed in same manner. Pt then donned bra and shirt with set up and increased time. Pt required minA for donning pants. PTA discussed any barriers to d/c this week with pt indicating have not performed bed mobility from flat bed. Participated in sit to/from supine from flat bed without rails requring minA then pt indicated family had obtained bed rail therefore performed blocked practice bed mobiltiy progressing from minA to supervision with increased time. PTA indicated and placed on white board to continue to practice this when getting in/out of bed even with nursing with pt verbalizing understanding. Pt then performed ambulatory transfer bed to recliner with CGA and left in recliner at end of session with seat alarm on, call bell within reach and needs met.      Therapy Documentation Precautions:  Precautions Precautions: Fall Restrictions Weight Bearing Restrictions: No General:   Vital Signs: Therapy  Vitals Pulse Rate: 93 Resp: 18 BP: (!) 126/59 Patient Position (if appropriate): Sitting Oxygen Therapy SpO2: 98 % O2 Device: Room Air Pain: Pain Assessment Pain Scale: 0-10 Pain Score: 0-No pain   Therapy/Group: Individual Therapy  Javontae Marlette  Wilder Kurowski, PTA  01/27/2020, 4:11 PM

## 2020-01-27 NOTE — Progress Notes (Signed)
Patient ID: Jill Rose, adult   DOB: 03-07-1941, 79 y.o.   MRN: [id number]   Sw followed up with patient inquiring about [facility] or Outpatient for therapy follow up. Sw informed patient of OP recommendation. Patient will prefer [facility] services. SW will follow up.

## 2020-01-28 ENCOUNTER — Encounter (HOSPITAL_COMMUNITY): Payer: Medicare HMO | Admitting: Occupational Therapy

## 2020-01-28 ENCOUNTER — Ambulatory Visit (HOSPITAL_COMMUNITY): Payer: Medicare HMO | Admitting: Physical Therapy

## 2020-01-28 ENCOUNTER — Inpatient Hospital Stay (HOSPITAL_COMMUNITY): Payer: Medicare HMO | Admitting: Occupational Therapy

## 2020-01-28 ENCOUNTER — Encounter (HOSPITAL_COMMUNITY): Payer: Medicare HMO | Admitting: Speech Pathology

## 2020-01-28 DIAGNOSIS — I10 Essential (primary) hypertension: Secondary | ICD-10-CM

## 2020-01-28 DIAGNOSIS — N182 Chronic kidney disease, stage 2 (mild): Secondary | ICD-10-CM

## 2020-01-28 LAB — GLUCOSE, CAPILLARY
Glucose-Capillary: 109 mg/dL — ABNORMAL HIGH (ref 70–99)
Glucose-Capillary: 107 mg/dL — ABNORMAL HIGH (ref 70–99)
Glucose-Capillary: 133 mg/dL — ABNORMAL HIGH (ref 70–99)
Glucose-Capillary: 104 mg/dL — ABNORMAL HIGH (ref 70–99)

## 2020-01-28 LAB — CREATININE, SERUM
Creatinine, Ser: 0.75 mg/dL (ref 0.44–1.00)
GFR calc Af Amer: >60 mL/min (ref >60)
GFR calc non Af Amer: >60 mL/min (ref >60)

## 2020-01-28 MED ORDER — HYDRALAZINE HCL 10 MG PO TABS: 10.0000 mg | ORAL_TABLET | Freq: Every day | ORAL | 0 refills | Status: DC

## 2020-01-28 MED ORDER — LEVOTHYROXINE SODIUM 88 MCG PO TABS: 88.0000 ug | ORAL_TABLET | Freq: Every day | ORAL | 0 refills | Status: DC

## 2020-01-28 MED ORDER — CLOPIDOGREL BISULFATE 75 MG PO TABS: 75.0000 mg | ORAL_TABLET | Freq: Every day | ORAL | 0 refills | Status: DC

## 2020-01-28 MED ORDER — METFORMIN HCL 500 MG PO TABS: 500.0000 mg | ORAL_TABLET | Freq: Two times a day (BID) | ORAL | 0 refills | Status: DC

## 2020-01-28 MED ORDER — ATORVASTATIN CALCIUM 80 MG PO TABS: 80.0000 mg | ORAL_TABLET | Freq: Every day | ORAL | 0 refills | Status: AC

## 2020-01-28 MED ORDER — AMLODIPINE BESYLATE 10 MG PO TABS: 10.0000 mg | ORAL_TABLET | Freq: Every day | ORAL | 0 refills | Status: DC

## 2020-01-28 MED ORDER — ACETAMINOPHEN 325 MG PO TABS: 650.0000 mg | ORAL_TABLET | ORAL | Status: AC | PRN

## 2020-01-28 MED ORDER — LOSARTAN POTASSIUM 100 MG PO TABS: 100.0000 mg | ORAL_TABLET | Freq: Every day | ORAL | 0 refills | Status: DC

## 2020-01-28 NOTE — Patient Care Conference (Cosign Needed)
Inpatient RehabilitationTeam Conference and Plan of Care Update Date: 01/28/2020   Time: 2:43 PM    Patient Name: Jill Rose      Medical Record Number: [id number]  Date of Birth: [birth year] Sex: Adult         Room/Bed: 4M02C/4M02C-01 Payor Info: Payor: [employer] MEDICARE / Plan: [employer] MEDICARE HMO/PPO / Product Type: *No Product type* /    Admit Date/Time:  01/14/2020  5:25 PM  Primary Diagnosis:  Right middle cerebral artery stroke [facility])  Hospital Problems: Principal Problem:   Right middle cerebral artery stroke (HCC) Active Problems:   Elevated BUN   Hyponatremia   Diabetic peripheral neuropathy (HCC)   Labile blood glucose   Labile blood pressure   Hemiparesis affecting left side as late effect of stroke (HCC)   Benign essential HTN   CKD (chronic kidney disease), stage II    Expected Discharge Date: Expected Discharge Date: 01/29/20  Team Members Present: Physician leading conference: Dr. Maryla Morrow Care Coodinator Present: Chana Bode, RN, BSN, CRRN;Christina Burgoon, BSW Nurse Present: Adam Phenix, LPN PT Present: Aleda Grana, PT OT Present: Dolphus Jenny, OT SLP Present: Suzzette Righter, CF-SLP PPS Coordinator present : Fae Pippin, SLP     Current Status/Progress Goal Weekly Team Focus  Bowel/Bladder   Continent x2 LBM 7/13  remain continent  assess toileting qshift and PRN   Swallow/Nutrition/ Hydration             ADL's   CGA functional mobility; supervision for BADLs using AE for LB  supervision for ADLs and transfers  Functional mobility training, gross motor coordination re-ed, AE training for self care, DC planning   Mobility   MinA to CGA bed mobility from flat bed, supervision STS transfer with verbal cues, CGA gait with RW 50 but with shuffling gait and poor R foot clearance and step length, CGA stars with B rail  supervision  reinforcement of safe transfers, gait mechanics, dynamic balance, endurance   Communication              Safety/Cognition/ Behavioral Observations            Pain   no c/o pain  remain pain free  assess pain qshift and PRN   Skin   no skin impairments  remain free of skin impairments  assess skin qshift and PRN     Team Discussion:  Discharge Planning/Teaching Needs:  Goal to discharge home with family support  Familt education with daughter this week   Current Update: on target for d/c of 01/29/2020  Current Barriers to Discharge:  None  Possible Resolutions to Barriers:   Patient on target to meet rehab goals: yes  *See Care Plan and progress notes for long and short-term goals.   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 is felt to be thromboembolic secondary to small and large vessel disease Weekly Focus/Goal: Improve mobility, BP, DM, decreased GFR  Barriers to Discharge: Medical stability   Possible Resolutions to Barriers: Therapies, optimize DM/BP meds, encourage fluids, family education   Continued Need for Acute Rehabilitation Level of Care: The patient requires daily medical management by a physician with specialized training in physical medicine and rehabilitation for the following reasons: Direction of a multidisciplinary physical rehabilitation program to maximize functional independence : Yes Medical management of patient stability for increased activity during participation in an intensive rehabilitation regime.: Yes Analysis of laboratory values and/or radiology  reports with any subsequent need for medication adjustment and/or medical intervention. : Yes   I attest that I was present, lead the team conference, and concur with the assessment and plan of the team.   Chana Bode B 01/28/2020, 2:43 PM

## 2020-01-28 NOTE — Plan of Care (Signed)
Problem: Consults Goal: RH STROKE PATIENT EDUCATION Description: See Patient Education module for education specifics  Outcome: Progressing Goal: Diabetes Guidelines if Diabetic/Glucose > 140 Description: If diabetic or lab glucose is > 140 mg/dl - Initiate Diabetes/Hyperglycemia Guidelines & Document Interventions  Outcome: Progressing   Problem: RH BLADDER ELIMINATION Goal: RH STG MANAGE BLADDER WITH ASSISTANCE Description: STG Manage Bladder With supervision Assistance Outcome: Progressing   Problem: RH SKIN INTEGRITY Goal: RH STG SKIN FREE OF INFECTION/BREAKDOWN Description: Pt will be free of skin breakdown/infection prior to DC with supervision assist Outcome: Progressing Goal: RH STG MAINTAIN SKIN INTEGRITY WITH ASSISTANCE Description: STG Maintain Skin Integrity With supervision Assistance. Outcome: Progressing   Problem: RH SAFETY Goal: RH STG ADHERE TO SAFETY PRECAUTIONS W/ASSISTANCE/DEVICE Description: STG Adhere to Safety Precautions With cues/reminders Assistance/Device. Outcome: Progressing   Problem: RH KNOWLEDGE DEFICIT Goal: RH STG INCREASE KNOWLEDGE OF DIABETES Description: Pt/family will be able to demonstrate knowledge of DM management with medications and diet precautions and ways to monitor for hypo/hyperglycemia with cues/reminders assist using handouts/booklets provided by staff Outcome: Progressing Goal: RH STG INCREASE KNOWLEDGE OF HYPERTENSION Description: Pt/family will be able to demonstrate knowledge of HTN management with medications and diet precautions and ways to monitor blood pressure with cues/reminders assist using handouts/booklets provided by staff Outcome: Progressing Goal: RH STG INCREASE KNOWLEGDE OF HYPERLIPIDEMIA Description: Pt/family will be able to demonstrate knowledge of HLD management with medications and diet precautions with cues/reminders assist using handouts/booklets provided by staff Outcome: Progressing Goal: RH STG  INCREASE KNOWLEDGE OF STROKE PROPHYLAXIS Description: Pt/family will be able to demonstrate knowledge of stroke prevention with medications and diet precautions and ways to monitor signs/symptoms of stroke with cues/reminders assist using handouts/booklets provided by staff Outcome: Progressing   Problem: Consults Goal: RH STROKE PATIENT EDUCATION Description: See Patient Education module for education specifics  Outcome: Progressing Goal: Diabetes Guidelines if Diabetic/Glucose > 140 Description: If diabetic or lab glucose is > 140 mg/dl - Initiate Diabetes/Hyperglycemia Guidelines & Document Interventions  Outcome: Progressing   Problem: RH BLADDER ELIMINATION Goal: RH STG MANAGE BLADDER WITH ASSISTANCE Description: STG Manage Bladder With supervision Assistance Outcome: Progressing   Problem: RH SKIN INTEGRITY Goal: RH STG SKIN FREE OF INFECTION/BREAKDOWN Description: Pt will be free of skin breakdown/infection prior to DC with supervision assist Outcome: Progressing Goal: RH STG MAINTAIN SKIN INTEGRITY WITH ASSISTANCE Description: STG Maintain Skin Integrity With supervision Assistance. Outcome: Progressing   Problem: RH SAFETY Goal: RH STG ADHERE TO SAFETY PRECAUTIONS W/ASSISTANCE/DEVICE Description: STG Adhere to Safety Precautions With cues/reminders Assistance/Device. Outcome: Progressing   Problem: RH KNOWLEDGE DEFICIT Goal: RH STG INCREASE KNOWLEDGE OF DIABETES Description: Pt/family will be able to demonstrate knowledge of DM management with medications and diet precautions and ways to monitor for hypo/hyperglycemia with cues/reminders assist using handouts/booklets provided by staff Outcome: Progressing Goal: RH STG INCREASE KNOWLEDGE OF HYPERTENSION Description: Pt/family will be able to demonstrate knowledge of HTN management with medications and diet precautions and ways to monitor blood pressure with cues/reminders assist using handouts/booklets provided by  staff Outcome: Progressing Goal: RH STG INCREASE KNOWLEGDE OF HYPERLIPIDEMIA Description: Pt/family will be able to demonstrate knowledge of HLD management with medications and diet precautions with cues/reminders assist using handouts/booklets provided by staff Outcome: Progressing Goal: RH STG INCREASE KNOWLEDGE OF STROKE PROPHYLAXIS Description: Pt/family will be able to demonstrate knowledge of stroke prevention with medications and diet precautions and ways to monitor signs/symptoms of stroke with cues/reminders assist using handouts/booklets provided by staff Outcome: Progressing   Problem: Consults  Goal: RH STROKE PATIENT EDUCATION Description: See Patient Education module for education specifics  Outcome: Progressing Goal: Diabetes Guidelines if Diabetic/Glucose > 140 Description: If diabetic or lab glucose is > 140 mg/dl - Initiate Diabetes/Hyperglycemia Guidelines & Document Interventions  Outcome: Progressing   Problem: RH BLADDER ELIMINATION Goal: RH STG MANAGE BLADDER WITH ASSISTANCE Description: STG Manage Bladder With supervision Assistance Outcome: Progressing   Problem: RH SKIN INTEGRITY Goal: RH STG SKIN FREE OF INFECTION/BREAKDOWN Description: Pt will be free of skin breakdown/infection prior to DC with supervision assist Outcome: Progressing Goal: RH STG MAINTAIN SKIN INTEGRITY WITH ASSISTANCE Description: STG Maintain Skin Integrity With supervision Assistance. Outcome: Progressing   Problem: RH SAFETY Goal: RH STG ADHERE TO SAFETY PRECAUTIONS W/ASSISTANCE/DEVICE Description: STG Adhere to Safety Precautions With cues/reminders Assistance/Device. Outcome: Progressing   Problem: RH KNOWLEDGE DEFICIT Goal: RH STG INCREASE KNOWLEDGE OF DIABETES Description: Pt/family will be able to demonstrate knowledge of DM management with medications and diet precautions and ways to monitor for hypo/hyperglycemia with cues/reminders assist using handouts/booklets provided by  staff Outcome: Progressing Goal: RH STG INCREASE KNOWLEDGE OF HYPERTENSION Description: Pt/family will be able to demonstrate knowledge of HTN management with medications and diet precautions and ways to monitor blood pressure with cues/reminders assist using handouts/booklets provided by staff Outcome: Progressing Goal: RH STG INCREASE KNOWLEGDE OF HYPERLIPIDEMIA Description: Pt/family will be able to demonstrate knowledge of HLD management with medications and diet precautions with cues/reminders assist using handouts/booklets provided by staff Outcome: Progressing Goal: RH STG INCREASE KNOWLEDGE OF STROKE PROPHYLAXIS Description: Pt/family will be able to demonstrate knowledge of stroke prevention with medications and diet precautions and ways to monitor signs/symptoms of stroke with cues/reminders assist using handouts/booklets provided by staff Outcome: Progressing   Problem: Consults Goal: RH STROKE PATIENT EDUCATION Description: See Patient Education module for education specifics  Outcome: Progressing Goal: Diabetes Guidelines if Diabetic/Glucose > 140 Description: If diabetic or lab glucose is > 140 mg/dl - Initiate Diabetes/Hyperglycemia Guidelines & Document Interventions  Outcome: Progressing   Problem: RH BLADDER ELIMINATION Goal: RH STG MANAGE BLADDER WITH ASSISTANCE Description: STG Manage Bladder With supervision Assistance Outcome: Progressing   Problem: RH SKIN INTEGRITY Goal: RH STG SKIN FREE OF INFECTION/BREAKDOWN Description: Pt will be free of skin breakdown/infection prior to DC with supervision assist Outcome: Progressing Goal: RH STG MAINTAIN SKIN INTEGRITY WITH ASSISTANCE Description: STG Maintain Skin Integrity With supervision Assistance. Outcome: Progressing   Problem: RH SAFETY Goal: RH STG ADHERE TO SAFETY PRECAUTIONS W/ASSISTANCE/DEVICE Description: STG Adhere to Safety Precautions With cues/reminders Assistance/Device. Outcome: Progressing    Problem: RH KNOWLEDGE DEFICIT Goal: RH STG INCREASE KNOWLEDGE OF DIABETES Description: Pt/family will be able to demonstrate knowledge of DM management with medications and diet precautions and ways to monitor for hypo/hyperglycemia with cues/reminders assist using handouts/booklets provided by staff Outcome: Progressing Goal: RH STG INCREASE KNOWLEDGE OF HYPERTENSION Description: Pt/family will be able to demonstrate knowledge of HTN management with medications and diet precautions and ways to monitor blood pressure with cues/reminders assist using handouts/booklets provided by staff Outcome: Progressing Goal: RH STG INCREASE KNOWLEGDE OF HYPERLIPIDEMIA Description: Pt/family will be able to demonstrate knowledge of HLD management with medications and diet precautions with cues/reminders assist using handouts/booklets provided by staff Outcome: Progressing Goal: RH STG INCREASE KNOWLEDGE OF STROKE PROPHYLAXIS Description: Pt/family will be able to demonstrate knowledge of stroke prevention with medications and diet precautions and ways to monitor signs/symptoms of stroke with cues/reminders assist using handouts/booklets provided by staff Outcome: Progressing

## 2020-01-28 NOTE — Progress Notes (Addendum)
Occupational Therapy Discharge Summary  Patient Details  Name: Jill Rose MRN: [id number] Date of Birth: 20-May-1941  Today's Date: 01/28/2020 OT Individual Time: 1500-1600 OT Individual Time Calculation (min): 60 min    Patient has met 7 of 9 long term goals due to improved activity tolerance, improved balance, ability to compensate for deficits, improved attention, improved awareness and improved coordination.  Patient to discharge at overall Supervision level.  Patient's care partner is independent to provide the necessary physical and cognitive assistance at discharge.    Reasons goals not met: Impaired motor planning hinders pts independence using AE for LB dressing and requires increase assist for toilet transfers.  Recommendation:  Patient will benefit from ongoing skilled OT services in outpatient setting to continue to advance functional skills in the area of BADL and iADL.  Pt would benefit from continued neuro re-ed to improve gross motor planning LUE and BLE, further education in use of AE for LB dressing, and dynamic standing balance training.  Pt would also benefit from functional cognitive training for higher level IADLs as well as safety and environmental modification education for IADLs.  Equipment: shower bench, RW, reacher  Reasons for discharge: treatment goals met and discharge from hospital   Skilled Intervention: Pt ambulating to bathroom with nurse tech and dtr present to assist.  OT assisted pt to toilet with CGA using RW needing TCs and VCs for RW mgt.  Pt completed toileting with supervision.  Educated pts dtr regarding assist level pt needs during toileting, bathing, dressing, and functional transfers.  Pt demonstrated tub bench transfer in walk in shower with supervision.  Pt doffed/donned socks with reacher and sock aide needing min assist.  Educated pts dtr regarding need for daily skin checks bottom of feet using extended handle mirror.  Pts dtr eager to learn  and reported good understanding of all education.  Call bell in reach, seat alarm on.  Patient/family agrees with progress made and goals achieved: Yes  OT Discharge Precautions/Restrictions  Precautions Precautions: Fall Restrictions Weight Bearing Restrictions: No ADL ADL Eating: Independent Grooming: Setup Where Assessed-Grooming: Sitting at sink Upper Body Bathing: Setup Where Assessed-Upper Body Bathing: Shower Lower Body Bathing: Supervision/safety Where Assessed-Lower Body Bathing: Shower Upper Body Dressing: Supervision/safety Where Assessed-Upper Body Dressing: Chair Lower Body Dressing: Minimal assistance (for socks and shoes) Where Assessed-Lower Body Dressing: Chair Toileting: Supervision/safety Where Assessed-Toileting: Glass blower/designer: Therapist, music Method: Product/process development scientist Method: Heritage manager: Radio broadcast assistant, Estate agent: Within Financial controller Praxis: Intact Cognition Overall Cognitive Status: Impaired/Different from baseline Arousal/Alertness: Awake/alert Orientation Level: Oriented X4 Attention: Sustained Focused Attention: Appears intact Sustained Attention: Appears intact Memory: Impaired Memory Impairment: Retrieval deficit;Decreased short term memory Awareness: Impaired Awareness Impairment: Emergent impairment Problem Solving: Impaired Safety/Judgment: Appears intact Sensation Sensation Light Touch: Appears Intact Proprioception: Appears Intact Coordination Gross Motor Movements are Fluid and Coordinated: No Fine Motor Movements are Fluid and Coordinated: No Coordination and Movement Description: slow movement patterns Motor  Motor Motor: Hemiplegia Motor - Skilled Clinical Observations: mild L hemibody weakness Motor - Discharge Observations: mild L hemibody weakness Mobility  Bed  Mobility Bed Mobility: Rolling Right;Rolling Left;Supine to Sit;Sit to Supine Rolling Right: Supervision/verbal cueing Rolling Left: Supervision/Verbal cueing Supine to Sit: Supervision/Verbal cueing Sit to Supine: Supervision/Verbal cueing Transfers Sit to Stand: Supervision/Verbal cueing Stand to Sit: Contact Guard/Touching assist  Trunk/Postural Assessment  Cervical Assessment Cervical Assessment: Within Functional Limits  Thoracic Assessment Thoracic Assessment: Within Functional Limits Lumbar Assessment Lumbar Assessment: Within Functional Limits Postural Control Postural Control: Deficits on evaluation Protective Responses: delayed  Balance Balance Balance Assessed: Yes Static Sitting Balance Static Sitting - Level of Assistance: 5: Stand by assistance Dynamic Sitting Balance Dynamic Sitting - Level of Assistance: 5: Stand by assistance Static Standing Balance Static Standing - Level of Assistance: 5: Stand by assistance Dynamic Standing Balance Dynamic Standing - Level of Assistance: 5: Stand by assistance;4: Min assist        Ezekiel Slocumb 01/28/2020, 4:33 PM

## 2020-01-28 NOTE — Progress Notes (Signed)
Speech Language Pathology Discharge Summary  Patient Details  Name: Jill Rose MRN: [id number] Date of Birth: Nov 05, 1940  Today's Date: 01/28/2020 SLP Individual Time: 1300-1331 SLP Individual Time Calculation (min): 31 min   Skilled Therapeutic Interventions:  Pt was seen for skilled ST targeting cognition and education with pt and her daughter. SLP facilitated beginning of session with re-administration of portions of the [facility] version 7.1 as post-test. Pt recalled 4/5 words during delayed recall subtest without cues, and remaining work with 1 supervision level verbal cues, which is greatly improved since admission. Pt also with moderate improvements in basic calculations during serial subtraction subtest. When pt's daughter arrived, SLP reviewed pt's goals and progress while in ST. Handout and verbal review of compensatory memory strategies provided, and discussed other activities that promote cognitive stimulation and reorganization pt could do at home. Discussed recommendation for follow up OP ST. SLP reiterated recommendation for 24/7 supervision, assistance with complex tasks such as finance and medication management, and cooking. Pt's daughter acknowledged all recommendations and questions were answered to her and pt's satisfaction. Pt left sitting in chair with alarm set and needs within reach. Continue per current plan of care.     Patient has met 3 of 4 long term goals.  Patient to discharge at overall Supervision;Min level.  Reasons goals not met: fluctuating Supervision-Min A required for emergent awareness   Clinical Impression/Discharge Summary:   Pt made functional gains and met 3 out of 4 long term goals this admission. Pt currently requires Supervision-Min assist for basic and mildly complex tasks due to cognitive impairments impacting her problem solving, emergent awareness, and short term memory. As a result, pt will require 24/7  Supervision at discharge for greatest safety. Pt  has demonstrated improved recall and use of compensatory memory strategies, as well as error awareness and basic problem solving. However, given mild cognitive deficits still present, recommend pt continue to receive skilled ST services upon discharge. Pt and family education is complete at this time.    Care Partner:  Caregiver Able to Provide Assistance: Yes  Type of Caregiver Assistance: Cognitive  Recommendation:  24 hour supervision/assistance;Outpatient SLP  Rationale for SLP Follow Up: Maximize cognitive function and independence;Reduce caregiver burden   Equipment: none   Reasons for discharge: Discharged from hospital   Patient/Family Agrees with Progress Made and Goals Achieved: Yes    Arbutus Leas 01/28/2020, 7:11 AM

## 2020-01-28 NOTE — Progress Notes (Signed)
Occupational Therapy Session Note  Patient Details  Name: Jill Rose MRN: [id number] Date of Birth: April 18, 1941  Today's Date: 01/28/2020 OT Individual Time: 0935-1030 OT Individual Time Calculation (min): 55 min    Short Term Goals: Week 2:  OT Short Term Goal 1 (Week 2): Pt will be able to don pants over feet with min A. OT Short Term Goal 2 (Week 2): Pt will be able to donn/doff socks and shoes using AE with supervision OT Short Term Goal 3 (Week 2): Pt will be able to bathe UB/LB using long handled sponge with CGA.  Skilled Therapeutic Interventions/Progress Updates:      Pt seen for BADL retraining of toileting, bathing, and dressing with a focus on functional mobility and coordination of LUE/LLE.  Pt received in recliner ready for a shower.  She doffed clothing from recliner with min A to fully pull tight sweat pants off over feet.  Sit to stand from recliner with CGA and then ambulated with RW with CGA with cues for longer steps and management of RW.   Pt toileted with S, and then bathed with S in shower using bar for support with a sit to stand. Returned to recliner to dress. Needs EXTRA time and cues with donning clothing over feet and ultimately needed A with socks and shoes.  Overall pt participated well today. Resting in recliner with chair alarm on and all needs met.   Therapy Documentation Precautions:  Precautions Precautions: Fall Restrictions Weight Bearing Restrictions: No       Pain: Pain Assessment Pain Score: 0-No pain ADL: ADL Eating: Independent Grooming: Setup Where Assessed-Grooming: Sitting at sink Upper Body Bathing: Setup Where Assessed-Upper Body Bathing: Shower Lower Body Bathing: Supervision/safety Where Assessed-Lower Body Bathing: Shower Upper Body Dressing: Supervision/safety Where Assessed-Upper Body Dressing: Chair Lower Body Dressing: Minimal assistance (for socks and shoes) Where Assessed-Lower Body Dressing: Chair Toileting:  Supervision/safety Where Assessed-Toileting: Glass blower/designer: Therapist, music Method: Magazine features editor: Curator Method: Heritage manager: Radio broadcast assistant, Grab bars   Therapy/Group: Individual Therapy  [place] 01/28/2020, 12:40 PM

## 2020-01-28 NOTE — Progress Notes (Signed)
Patient ID: Jill Rose, adult   DOB: 02/09/1941, 79 y.o.   MRN: [id number]  Team Conference Report to Patient/Family  Team Conference discussion was reviewed with the patient and caregiver, including goals, any changes in plan of care and target discharge date.  Patient and caregiver express understanding and are in agreement.  The patient has a target discharge date of 01/29/20.  Andria Rhein 01/28/2020, 1:55 PM

## 2020-01-28 NOTE — Progress Notes (Addendum)
[facility] PHYSICAL MEDICINE & REHABILITATION PROGRESS NOTE  Subjective/Complaints: Patient seen sitting up in bed this AM.  She states she slept well overnight. She denies complaints.   ROS: Denies CP, SOB, N/V/D  Objective: Vital Signs: Blood pressure 135/62, pulse 83, temperature 98.2 F (36.8 C), resp. rate 17, height 5' 1.02" (1.55 m), weight 72.4 kg, SpO2 97 %. No results found. No results for input(s): WBC, HGB, HCT, PLT in the last 72 hours. Recent Labs    01/26/20 0859  NA 137  K 4.0  CL 101  CO2 25  GLUCOSE 169*  BUN 17  CREATININE 0.94  CALCIUM 9.3    Physical Exam: BP 135/62 (BP Location: Right Arm)   Pulse 83   Temp 98.2 F (36.8 C)   Resp 17   Ht 5' 1.02" (1.55 m)   Wt 72.4 kg   SpO2 97%   BMI 30.13 kg/m  Constitutional: No distress . Vital signs reviewed. HENT: Normocephalic.  Atraumatic. Eyes: EOMI. No discharge. Cardiovascular: No JVD. RRR. Respiratory: Normal effort.  No stridor. Bilaterally clear to auscultation.  GI: Non-distended. BS+.  Skin: Warm and dry.  Intact. Psych: Normal mood.  Normal behavior. Musc: No edema in extremities.  No tenderness in extremities. Neuro: Alert She makes eye contact with examiner.   Follows simple commands.   Good awareness of deficits. Motor:  LUE: 4+/5 proximal to distal, stable LLE: 4+/5 proximal to distal LUE: Mild ataxia, unchanged  Assessment/Plan: 1. 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  Physiatrist is providing close team supervision and 24 hour management of active medical problems listed below.  Physiatrist and rehab team continue to assess barriers to discharge/monitor patient progress toward functional and medical goals  Care Tool:  Bathing    Body parts bathed by patient: Face, Chest, Abdomen, Right arm, Left arm, Front perineal area, Right upper leg, Left upper leg,  Buttocks   Body parts bathed by helper: Buttocks     Bathing assist Assist Level: Minimal Assistance - Patient > 75%     Upper Body Dressing/Undressing Upper body dressing   What is the patient wearing?: Pull over shirt, Bra    Upper body assist Assist Level: Supervision/Verbal cueing    Lower Body Dressing/Undressing Lower body dressing      What is the patient wearing?: Underwear/pull up, Pants     Lower body assist Assist for lower body dressing: Supervision/Verbal cueing     Toileting Toileting    Toileting assist Assist for toileting: Supervision/Verbal cueing     Transfers Chair/bed transfer  Transfers assist     Chair/bed transfer assist level: Contact Guard/Touching assist     Locomotion Ambulation   Ambulation assist      Assist level: Contact Guard/Touching assist Assistive device: Walker-rolling Max distance: 50'   Walk 10 feet activity   Assist     Assist level: Contact Guard/Touching assist Assistive device: Walker-rolling   Walk 50 feet activity   Assist Walk 50 feet with 2 turns activity did not occur: Safety/medical concerns  Assist level: Contact Guard/Touching assist Assistive device: Walker-rolling    Walk 150 feet activity   Assist Walk 150 feet activity did not occur: Safety/medical concerns         Walk 10 feet on uneven surface  activity   Assist Walk 10 feet on uneven surfaces activity did not occur: Safety/medical concerns  Wheelchair     Assist Will patient use wheelchair at discharge?: No             Wheelchair 50 feet with 2 turns activity    Assist            Wheelchair 150 feet activity     Assist            Medical Problem List and Plan: 1.  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  2.  Antithrombotics: -DVT/anticoagulation: Lovenox  CBC within normal limits on 7/9             -antiplatelet therapy: Aspirin 325 mg and Plavix 75 mg daily x3 months then Plavix alone 3. Pain Management: Tylenol as needed 4. Mood: Provide emotional support             -antipsychotic agents: N/A 5. Neuropsych: This patient is capable of making decisions on her own behalf. 6. Skin/Wound Care: Routine skin checks 7. Fluids/Electrolytes/Nutrition: Routine in and outs.   BMP within normal limits, except for glucose on 7/12 8.  Hypertension.  Norvasc 10 mg daily.     Vitals:   01/27/20 1930 01/28/20 0607  BP: (!) 130/52 135/62  Pulse: 84 83  Resp: 18 17  Temp: 98.2 F (36.8 C)   SpO2: 97% 97%   Cozaar incrased to  to 100mg    Elevations, mainly at night, hydralazine 10 added at bedtime on 7/11  Controlled on 7/13 9.  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  Hemoglobin A1c 7.6.  SSI. Metformin 500 BID PTA. CBG (last 3)  Recent Labs    01/27/20 1646 01/27/20 2100 01/28/20 0605  GLUCAP 124* 146* 109*   Dietitian consult ordered  Metformin 500 daily started on 7/5, increase to twice daily on 7/7  Relatively controlled on 7/13  Monitor with increased mobility 10.  Hypothyroidism.  Synthroid 11.  Hyperlipidemia.  Continue Lipitor 12. Obesity: Encourage weight loss 13.  Hyponatremia: Resolved  Continue to monitor 14.  Elevated BUN: Improved  WNL on 7/12  Continue to encourage fluids 15. 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

## 2020-01-28 NOTE — Progress Notes (Signed)
Physical Therapy Discharge Summary  Patient Details  Name: Jill Rose MRN: [id number] Date of Birth: [birth year]  Today's Date: 01/28/2020 PT Individual Time: 1345-1430 PT Individual Time Calculation (min): 45 min    Patient has met 3 of 9 long term goals due to improved activity tolerance, improved balance, improved postural control and ability to compensate for deficits.  Patient to discharge at an ambulatory level [facility].   Patient's care partner is independent to provide the necessary physical and cognitive assistance at discharge. Pt's daughter Abram Sander has complete hands on family education.  Reasons goals not met: Pt is currently CGA to min A for the majority of her mobility and therefore did not reach Supervision level goals. Pt also is only able to ambulate x 50 ft maximum and therefore did not meet ambulation distance goals. Pt's daughter is able to provide the level of care that she will require at home.  Recommendation:  Patient will benefit from ongoing skilled PT services in outpatient setting to continue to advance safe functional mobility, address ongoing impairments in endurance, strength, safety, independence with functional mobility, cognition, and minimize fall risk.  Equipment: youth RW  Reasons for discharge: treatment goals met and discharge from hospital  Patient/family agrees with progress made and goals achieved: Yes   Skilled Intervention: Pt received seated in recliner in room. Pt's daughter Abram Sander present for hands-on family education session. Pt is currently at Supervision level for sit to stand, CGA for transfers with RW, can perform gait up to 50 ft with RW and CGA, stairs with 2 handrails and CGA, and is min A for car transfers. Reviewed how to assist pt with sit to stand, stand pivot transfers with RW, short distance gait, and car transfers with patient's daughter. She is able to perform return demonstration with min cueing. Pt's daughter also asking about  bed mobility. Pt is at Supervision level for bed mobility, recommending use of a bedrail at home and discussed where family can purchase a bedrail. Discussed tactile cueing and using short, simple cues vs overexplaining when attempting to assist pt with transfers. Pt left supine in bed with needs in reach, bed alarm in place, daughter present at end of session.  PT Discharge Precautions/Restrictions Precautions Precautions: Fall Restrictions Weight Bearing Restrictions: No Pain Pain Assessment Pain Scale: 0-10 Pain Score: 0-No pain Vision/Perception  Perception Perception: Within Functional Limits Praxis Praxis: Intact  Cognition Overall Cognitive Status: Impaired/Different from baseline Arousal/Alertness: Awake/alert Orientation Level: Oriented X4 Attention: Sustained Focused Attention: Appears intact Sustained Attention: Appears intact Memory: Impaired Memory Impairment: Retrieval deficit;Decreased short term memory Awareness: Impaired Awareness Impairment: Emergent impairment Problem Solving: Impaired Safety/Judgment: Appears intact Sensation Sensation Light Touch: Appears Intact Proprioception: Appears Intact Coordination Gross Motor Movements are Fluid and Coordinated: No Fine Motor Movements are Fluid and Coordinated: No Coordination and Movement Description: slow movement patterns Motor  Motor Motor: Hemiplegia Motor - Skilled Clinical Observations: mild L hemibody weakness Motor - Discharge Observations: mild L hemibody weakness  Mobility Bed Mobility Bed Mobility: Rolling Right;Rolling Left;Supine to Sit;Sit to Supine Rolling Right: Supervision/verbal cueing Rolling Left: Supervision/Verbal cueing Supine to Sit: Supervision/Verbal cueing Sit to Supine: Supervision/Verbal cueing Transfers Transfers: Sit to Stand;Stand to Sit;Stand Pivot Transfers Sit to Stand: Supervision/Verbal cueing Stand to Sit: Contact Guard/Touching assist Stand Pivot Transfers:  Contact Guard/Touching assist Stand Pivot Transfer Details: Tactile cues for posture;Verbal cues for sequencing;Tactile cues for weight shifting;Verbal cues for technique Transfer (Assistive device): Rolling walker Locomotion  Gait Ambulation: Yes Gait Assistance: Contact  Guard/Touching assist Gait Distance (Feet): 50 Feet Assistive device: Rolling walker Gait Gait: Yes Gait Pattern: Impaired Gait Pattern: Trendelenburg;Shuffle;Decreased step length - left;Decreased stride length Gait velocity: decreased Stairs / Additional Locomotion Stairs: Yes Stairs Assistance: Contact Guard/Touching assist Stair Management Technique: Two rails Number of Stairs: 8 Height of Stairs: 6 Wheelchair Mobility Wheelchair Mobility: No  Trunk/Postural Assessment  Cervical Assessment Cervical Assessment: Within Functional Limits Thoracic Assessment Thoracic Assessment: Within Functional Limits Lumbar Assessment Lumbar Assessment: Within Functional Limits Postural Control Postural Control: Deficits on evaluation Protective Responses: delayed  Balance Balance Balance Assessed: Yes Static Sitting Balance Static Sitting - Level of Assistance: 5: Stand by assistance Dynamic Sitting Balance Dynamic Sitting - Level of Assistance: 5: Stand by assistance Static Standing Balance Static Standing - Level of Assistance: 5: Stand by assistance Dynamic Standing Balance Dynamic Standing - Level of Assistance: 5: Stand by assistance;4: Min assist Extremity Assessment   RLE Assessment RLE Assessment: Within Functional Limits General Strength Comments: Grossly 5/5 LLE Assessment LLE Assessment: Exceptions to [facility] LLE Strength Left Hip Flexion: 3-/5 Left Knee Flexion: 3+/5 Left Knee Extension: 3+/5 Left Ankle Dorsiflexion: 4/5 Left Ankle Plantar Flexion: 4/5     Excell Seltzer, PT, DPT 01/28/2020, 4:31 PM

## 2020-01-29 LAB — GLUCOSE, CAPILLARY: Glucose-Capillary: 107 mg/dL — ABNORMAL HIGH (ref 70–99)

## 2020-01-29 NOTE — Discharge Instructions (Signed)
Inpatient Rehab Discharge Instructions  Jill Rose Discharge date and time: No discharge date for patient encounter.   Activities/Precautions/ Functional Status: Activity: activity as tolerated Diet: diabetic diet Wound Care: none needed Functional status:  ___ No restrictions     ___ Walk up steps independently ___ 24/7 supervision/assistance   ___ Walk up steps with assistance ___ Intermittent supervision/assistance  ___ Bathe/dress independently ___ Walk with walker     _x__ Bathe/dress with assistance ___ Walk Independently    ___ Shower independently ___ Walk with assistance    ___ Shower with assistance ___ No alcohol     ___ Return to work/school ________ COMMUNITY REFERRALS UPON DISCHARGE:    Outpatient: PT     OT    ST                 Agency: [facility]  Phone: [phone]              Appointment Date/Time: Facility to Determine at Discharge  Medical Equipment/Items Ordered: Youth Agricultural consultant, Tour manager                                                 Agency/Supplier: [employer]  Special Instructions: No driving smoking or alcohol  Continue aspirin and Plavix x3 months then Plavix alone  STROKE/TIA DISCHARGE INSTRUCTIONS SMOKING Cigarette smoking nearly doubles your risk of having a stroke & is the single most alterable risk factor  If you smoke or have smoked in the last 12 months, you are advised to quit smoking for your health.  Most of the excess cardiovascular risk related to smoking disappears within a year of stopping.  Ask you doctor about anti-smoking medications  [place] Quit Line: 1-800-QUIT NOW  Free Smoking Cessation Classes [phone]  CHOLESTEROL Know your levels; limit fat & cholesterol in your diet  Lipid Panel     Component Value Date/Time   CHOL 265 (H) 01/06/2020 0235   TRIG 65 01/06/2020 0235   HDL 56 01/06/2020 0235   CHOLHDL 4.7 01/06/2020 0235   VLDL 13 01/06/2020 0235   LDLCALC 196  (H) 01/06/2020 0235      Many patients benefit from treatment even if their cholesterol is at goal.  Goal: Total Cholesterol (CHOL) less than 160  Goal:  Triglycerides (TRIG) less than 150  Goal:  HDL greater than 40  Goal:  LDL (LDLCALC) less than 100   BLOOD PRESSURE American Stroke Association blood pressure target is less that 120/80 mm/Hg  Your discharge blood pressure is:  BP: (!) 123/58  Monitor your blood pressure  Limit your salt and alcohol intake  Many individuals will require more than one medication for high blood pressure  DIABETES (A1c is a blood sugar average for last 3 months) Goal HGBA1c is under 7% (HBGA1c is blood sugar average for last 3 months)  Diabetes:     Lab Results  Component Value Date   HGBA1C 7.6 (H) 01/06/2020     Your HGBA1c can be lowered with medications, healthy diet, and exercise.  Check your blood sugar as directed by your physician  Call your physician if you experience unexplained or low blood sugars.  PHYSICAL ACTIVITY/REHABILITATION Goal is 30 minutes at least 4 days per week  Activity: Increase activity slowly, Therapies: Physical Therapy: Home Health Return to work:  Activity decreases your risk of heart attack and stroke and makes your heart stronger.  It helps control your weight and blood pressure; helps you relax and can improve your mood.  Participate in a regular exercise program.  Talk with your doctor about the best form of exercise for you (dancing, walking, swimming, cycling).  DIET/WEIGHT Goal is to maintain a healthy weight  Your discharge diet is:  Diet Order            Diet Carb Modified Fluid consistency: Thin; Room service appropriate? Yes  Diet effective now                 liquids Your height is:  Height: 5' 1.02" (155 cm) Your current weight is: Weight: 76.8 kg Your Body Mass Index (BMI) is:  BMI (Calculated): 31.97  Following the type of diet specifically designed for you will help prevent  another stroke.  Your goal weight range is:    Your goal Body Mass Index (BMI) is 19-24.  Healthy food habits can help reduce 3 risk factors for stroke:  High cholesterol, hypertension, and excess weight.  RESOURCES Stroke/Support Group:  Call [phone]   STROKE EDUCATION PROVIDED/REVIEWED AND GIVEN TO PATIENT Stroke warning signs and symptoms How to activate emergency medical system (call 911). Medications prescribed at discharge. Need for follow-up after discharge. Personal risk factors for stroke. Pneumonia vaccine given:  Flu vaccine given:  My questions have been answered, the writing is legible, and I understand these instructions.  I will adhere to these goals & educational materials that have been provided to me after my discharge from the hospital.      My questions have been answered and I understand these instructions. I will adhere to these goals and the provided educational materials after my discharge from the hospital.  Patient/Caregiver Signature _______________________________ Date __________  Clinician Signature _______________________________________ Date __________  Please bring this form and your medication list with you to all your follow-up doctor's appointments.

## 2020-01-29 NOTE — Progress Notes (Signed)
Patient was given D/C instructions by Dan,Pa-C. Patient was accompanied by daughter. Belongings have been packed and sent with patient. Leane Para, LPN

## 2020-01-29 NOTE — Progress Notes (Signed)
[place] PHYSICAL MEDICINE & REHABILITATION PROGRESS NOTE  Subjective/Complaints: Patient seen sitting up in her chair this morning.  She states she slept well overnight.  She is ready for discharge, stating her daughter is going to come pick her up.  She has questions regarding discharge recommendations.  ROS: Denies CP, SOB, N/V/D  Objective: Vital Signs: Blood pressure (!) 155/61, pulse 88, temperature 97.9 F (36.6 C), temperature source Oral, resp. rate 16, height 5' 1.02" (1.55 m), weight 72.4 kg, SpO2 99 %. No results found. No results for input(s): WBC, HGB, HCT, PLT in the last 72 hours. Recent Labs    01/26/20 0859 01/28/20 0729  NA 137  --   K 4.0  --   CL 101  --   CO2 25  --   GLUCOSE 169*  --   BUN 17  --   CREATININE 0.94 0.75  CALCIUM 9.3  --     Physical Exam: BP (!) 155/61 (BP Location: Left Arm)   Pulse 88   Temp 97.9 F (36.6 C) (Oral)   Resp 16   Ht 5' 1.02" (1.55 m)   Wt 72.4 kg   SpO2 99%   BMI 30.13 kg/m  Constitutional: No distress . Vital signs reviewed. HENT: Normocephalic.  Atraumatic. Eyes: EOMI. No discharge. Cardiovascular: No JVD.  RRR. Respiratory: Normal effort.  No stridor.  Bilateral clear to auscultation. GI: Non-distended.  BS +. Skin: Warm and dry.  Intact. Psych: Normal mood.  Normal behavior. Musc: No edema in extremities.  No tenderness in extremities. Neuro: Alert She makes eye contact with examiner.   Follows simple commands.   Good awareness of deficits. Motor:  LUE: 4+/5 proximal to distal, stable LLE: 4+/5 proximal to distal LUE: Mild ataxia, stable  Assessment/Plan: 1. 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  Physiatrist is providing close team supervision and 24 hour management of active medical problems listed below.  Physiatrist and rehab team continue to assess barriers to  discharge/monitor patient progress toward functional and medical goals  Care Tool:  Bathing    Body parts bathed by patient: Face, Chest, Abdomen, Right arm, Left arm, Front perineal area, Right upper leg, Left upper leg, Buttocks, Right lower leg, Left lower leg   Body parts bathed by helper: Buttocks     Bathing assist Assist Level: Supervision/Verbal cueing     Upper Body Dressing/Undressing Upper body dressing   What is the patient wearing?: Pull over shirt, Bra    Upper body assist Assist Level: Supervision/Verbal cueing    Lower Body Dressing/Undressing Lower body dressing      What is the patient wearing?: Underwear/pull up, Pants     Lower body assist Assist for lower body dressing: Supervision/Verbal cueing     Toileting Toileting    Toileting assist Assist for toileting: Supervision/Verbal cueing     Transfers Chair/bed transfer  Transfers assist     Chair/bed transfer assist level: Contact Guard/Touching assist     Locomotion Ambulation   Ambulation assist      Assist level: Contact Guard/Touching assist Assistive device: Walker-rolling Max distance: 50'   Walk 10 feet activity   Assist     Assist level: Contact Guard/Touching assist Assistive device: Walker-rolling   Walk 50 feet activity   Assist Walk 50 feet with 2 turns activity did not occur: Safety/medical concerns  Assist level: Contact Guard/Touching assist Assistive device: Walker-rolling  Walk 150 feet activity   Assist Walk 150 feet activity did not occur: Safety/medical concerns         Walk 10 feet on uneven surface  activity   Assist Walk 10 feet on uneven surfaces activity did not occur: Safety/medical concerns         Wheelchair     Assist Will patient use wheelchair at discharge?: No             Wheelchair 50 feet with 2 turns activity    Assist            Wheelchair 150 feet activity     Assist             Medical Problem List and Plan: 1.  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  Antithrombotics: -DVT/anticoagulation: Lovenox  CBC within normal limits on 7/9             -antiplatelet therapy: Aspirin 325 mg and Plavix 75 mg daily x3 months then Plavix alone 3. Pain Management: Tylenol as needed 4. Mood: Provide emotional support             -antipsychotic agents: N/A 5. Neuropsych: This patient is capable of making decisions on her own behalf. 6. Skin/Wound Care: Routine skin checks 7. Fluids/Electrolytes/Nutrition: Routine in and outs.  Creatinine within normal limits on 7/14 8.  Hypertension.  Norvasc 10 mg daily.     Vitals:   01/28/20 1932 01/29/20 0320  BP: (!) 112/59 (!) 155/61  Pulse: 78 88  Resp: 16 16  Temp: 97.9 F (36.6 C) 97.9 F (36.6 C)  SpO2: 98% 99%   Cozaar incrased to  to 100mg    Elevations, mainly at night, hydralazine 10 added at bedtime on 7/11  Labile, with elevation again overnight, monitor in ambulatory setting with potential further adjustments as necessary 9.  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  Hemoglobin A1c 7.6.  SSI. Metformin 500 BID PTA. CBG (last 3)  Recent Labs    01/28/20 1625 01/28/20 2052 01/29/20 0625  GLUCAP 133* 104* 107*   Dietitian consult ordered  Metformin 500 daily started on 7/5, increase to twice daily on 7/7  Relatively controlled on 7/15  Monitor with increased mobility 10.  Hypothyroidism.  Synthroid 11.  Hyperlipidemia.  Continue Lipitor 12. Obesity: Encourage weight loss 13.  Hyponatremia: Resolved  Continue to monitor 14.  Elevated BUN: Improved  WNL on 7/12  Continue to encourage fluids 15. 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

## 2020-01-29 NOTE — Progress Notes (Signed)
Inpatient Rehabilitation Care Coordinator  Discharge Note  The overall goal for the admission was met for:   Discharge location: Yes, home  Length of Stay: Yes, 15 Days  Discharge activity level: Yes, Ambulatory Min A  Home/community participation: Yes  Services provided included: MD, RD, PT, OT, SLP, RN, CM, TR, Pharmacy, [place]: Private Insurance: [employer] Medicare  Follow-up services arranged: Outpatient: [facility]  Comments (or additional information): PT OT ST  Patient/Family verbalized understanding of follow-up arrangements: Yes  Individual responsible for coordination of the follow-up plan: Quentin Angst  Confirmed correct DME delivered: Dyanne Iha 01/29/2020    Dyanne Iha

## 2020-01-29 NOTE — Plan of Care (Signed)
  Problem: Consults Goal: RH STROKE PATIENT EDUCATION Description: See Patient Education module for education specifics  Outcome: Completed/Met Goal: Diabetes Guidelines if Diabetic/Glucose > 140 Description: If diabetic or lab glucose is > 140 mg/dl - Initiate Diabetes/Hyperglycemia Guidelines & Document Interventions  Outcome: Completed/Met   Problem: RH BLADDER ELIMINATION Goal: RH STG MANAGE BLADDER WITH ASSISTANCE Description: STG Manage Bladder With supervision Assistance Outcome: Completed/Met   Problem: RH SKIN INTEGRITY Goal: RH STG SKIN FREE OF INFECTION/BREAKDOWN Description: Pt will be free of skin breakdown/infection prior to DC with supervision assist Outcome: Completed/Met Goal: RH STG MAINTAIN SKIN INTEGRITY WITH ASSISTANCE Description: STG Maintain Skin Integrity With supervision Assistance. Outcome: Completed/Met   Problem: RH SAFETY Goal: RH STG ADHERE TO SAFETY PRECAUTIONS W/ASSISTANCE/DEVICE Description: STG Adhere to Safety Precautions With cues/reminders Assistance/Device. Outcome: Completed/Met   Problem: RH KNOWLEDGE DEFICIT Goal: RH STG INCREASE KNOWLEDGE OF DIABETES Description: Pt/family will be able to demonstrate knowledge of DM management with medications and diet precautions and ways to monitor for hypo/hyperglycemia with cues/reminders assist using handouts/booklets provided by staff Outcome: Completed/Met Goal: RH STG INCREASE KNOWLEDGE OF HYPERTENSION Description: Pt/family will be able to demonstrate knowledge of HTN management with medications and diet precautions and ways to monitor blood pressure with cues/reminders assist using handouts/booklets provided by staff Outcome: Completed/Met Goal: RH STG INCREASE KNOWLEGDE OF HYPERLIPIDEMIA Description: Pt/family will be able to demonstrate knowledge of HLD management with medications and diet precautions with cues/reminders assist using handouts/booklets provided by staff Outcome:  Completed/Met Goal: RH STG INCREASE KNOWLEDGE OF STROKE PROPHYLAXIS Description: Pt/family will be able to demonstrate knowledge of stroke prevention with medications and diet precautions and ways to monitor signs/symptoms of stroke with cues/reminders assist using handouts/booklets provided by staff Outcome: Completed/Met

## 2020-01-29 NOTE — Progress Notes (Signed)
Patient ID: Jill Rose, adult   DOB: 1940/08/21, 79 y.o.   MRN: [id number]   Patient PCP:  Real Cons, PA Family [facility] - [facility] [phone] (Phone) [phone] (fax)

## 2020-02-02 ENCOUNTER — Telehealth: Payer: Self-pay | Admitting: General Practice

## 2020-02-02 ENCOUNTER — Telehealth: Payer: Self-pay | Admitting: Registered Nurse

## 2020-02-02 NOTE — Telephone Encounter (Signed)
Transitional Care call Transitional Questions were answered by Daughter  Patient name: Jill Rose  DOB: 1941-05-09 1. Are you/is patient experiencing any problems since coming home? No a. Are there any questions regarding any aspect of care? No 2. Are there any questions regarding medications administration/dosing? No a. Are meds being taken as prescribed? Yes b. "Patient should review meds with caller to confirm" Medication List Reviewed 3. Have there been any falls? No 4. Has Home Health been to the house and/or have they contacted you? No, This Provider placed a call to [facility], the secretary will call Ms. Benay Pike ( daughter) a. If not, have you tried to contact them? No, Ms. Benay Pike states she was told the F/U appointment offices would call her.  b. Can we help you contact them? Yes, see the above. 5. Are bowels and bladder emptying properly? Yes a. Are there any unexpected incontinence issues? (                                  ) b. If applicable, is patient following bowel/bladder programs? (                          ) 6. Any fevers, problems with breathing, unexpected pain? No 7. Are there any skin problems or new areas of breakdown? No 8. Has the patient/family member arranged specialty MD follow up (ie cardiology/neurology/renal/surgical/etc.)?  Ms. Benay Pike was instructed to call [facility] to schedule HFU appointment, she verbalizes understanding.  a. Can we help arrange? No 9. Does the patient need any other services or support that we can help arrange? No 10. Are caregivers following through as expected in assisting the patient? Yes 11. Has the patient quit smoking, drinking alcohol, or using drugs as recommended? (                        )  Appointment date/time 02/03/2020  arrival time 11:20 for 11:40 appointment with Dr Allena Katz. At [address] [facility] [address]

## 2020-02-02 NOTE — Telephone Encounter (Signed)
Patient canceled appointment on 12/29/2019. Called patient to reschedule, left voicemail to give the office a call back.

## 2020-02-03 ENCOUNTER — Encounter: Payer: Medicare HMO | Attending: Physical Medicine & Rehabilitation | Admitting: Physical Medicine & Rehabilitation

## 2020-02-03 ENCOUNTER — Other Ambulatory Visit: Payer: Self-pay

## 2020-02-03 ENCOUNTER — Encounter: Payer: Self-pay | Admitting: Physical Medicine & Rehabilitation

## 2020-02-03 VITALS — BP 146/71 | HR 95 | Temp 98.5°F | Ht 61.0 in | Wt 159.6 lb

## 2020-02-03 DIAGNOSIS — R269 Unspecified abnormalities of gait and mobility: Secondary | ICD-10-CM | POA: Diagnosis present

## 2020-02-03 DIAGNOSIS — I639 Cerebral infarction, unspecified: Secondary | ICD-10-CM | POA: Insufficient documentation

## 2020-02-03 DIAGNOSIS — I63511 Cerebral infarction due to unspecified occlusion or stenosis of right middle cerebral artery: Secondary | ICD-10-CM | POA: Diagnosis not present

## 2020-02-03 DIAGNOSIS — G8194 Hemiplegia, unspecified affecting left nondominant side: Secondary | ICD-10-CM | POA: Insufficient documentation

## 2020-02-03 DIAGNOSIS — I10 Essential (primary) hypertension: Secondary | ICD-10-CM | POA: Insufficient documentation

## 2020-02-03 DIAGNOSIS — E1165 Type 2 diabetes mellitus with hyperglycemia: Secondary | ICD-10-CM | POA: Diagnosis not present

## 2020-02-03 NOTE — Progress Notes (Signed)
Subjective:    Patient ID: Jill Rose, adult    DOB: [birth year], 79 y.o.   MRN: [id number]  HPI Right-handed adult with history of diet-controlled diabetes mellitus, hypertension, hypothyroidism, TIA presents for hospital follow up for right MCA infarct.  Admit date: 01/14/2020 Discharge date: 01/29/2020  Daughter supplements history. At discharge, she was instructed to follow up with Neurology, with whom she is going to make an appointment. Denies falls. BP is slightly elevated today, relatively controlled at home.  CBGs are slightly elevated.  Denies falls.  Therapies: To be scheduled soon DME: Shower chair Mobility: Walker at home and wheelchair in community.  Pain Inventory Average Pain 0 Pain Right Now 0 My pain is no pain  In the last 24 hours, has pain interfered with the following? General activity 0 Relation with others 0 Enjoyment of life no pain What TIME of day is your pain at its worst? no pain Sleep (in general) Fair  Pain is worse with:  no pain Pain improves with:  no pain Relief from Meds:  no pain  Mobility walk with assistance use a walker use a wheelchair needs help with transfers  Function I need assistance with the following:  dressing, bathing, toileting, meal prep, household duties and shopping  Neuro/Psych bladder control problems weakness trouble walking depression  Prior Studies Any changes since last visit?  no  Physicians involved in your care Any changes since last visit?  no Primary care [place]   seeing them today at 2 pm   Family History  Problem Relation Age of Onset  . Heart disease Mother    Social History   Socioeconomic History  . Marital status: Married    Spouse name: Not on file  . Number of children: Not on file  . Years of education: Not on file  . Highest education level: Not on file  Occupational History  . Not on file  Tobacco Use  . Smoking status: Never Smoker  . Smokeless tobacco: Never Used    Substance and Sexual Activity  . Alcohol use: No  . Drug use: No  . Sexual activity: Not on file  Other Topics Concern  . Not on file  Social History Narrative  . Not on file   Social Determinants of Health   Financial Resource Strain:   . Difficulty of Paying Living Expenses:   Food Insecurity:   . Worried About Programme researcher, broadcasting/film/video in the Last Year:   . Barista in the Last Year:   Transportation Needs:   . Freight forwarder (Medical):   Marland Kitchen Lack of Transportation (Non-Medical):   Physical Activity:   . Days of Exercise per Week:   . Minutes of Exercise per Session:   Stress:   . Feeling of Stress :   Social Connections:   . Frequency of Communication with Friends and Family:   . Frequency of Social Gatherings with Friends and Family:   . Attends Religious Services:   . Active Member of Clubs or Organizations:   . Attends Banker Meetings:   Marland Kitchen Marital Status:    No past surgical history on file. Past Medical History:  Diagnosis Date  . Diabetes mellitus without complication (HCC)   . Essential hypertension   . Hypothyroidism   . TIA (transient ischemic attack) 2018   BP (!) 146/71   Pulse 95   Temp 98.5 F (36.9 C)   Ht 5\' 1"  (1.549 m)  Wt 159 lb 9.6 oz (72.4 kg) Comment: last recorded  SpO2 96%   BMI 30.16 kg/m   Opioid Risk Score:   Fall Risk Score:  `1  Depression screen PHQ 2/9  Depression screen PHQ 2/9 02/03/2020  Decreased Interest 0  Down, Depressed, Hopeless 2  PHQ - 2 Score 2  Altered sleeping 0  Tired, decreased energy 3  Change in appetite 0  Feeling bad or failure about yourself  0  Trouble concentrating 0  Moving slowly or fidgety/restless 1  Suicidal thoughts 0  PHQ-9 Score 6   Review of Systems  Constitutional: Negative.   HENT: Negative.   Eyes: Negative.   Respiratory: Negative.   Cardiovascular: Negative.   Gastrointestinal: Negative.   Endocrine: Negative.   Genitourinary: Positive for urgency.   Musculoskeletal: Positive for gait problem.  Skin: Negative.   Allergic/Immunologic: Negative.   Neurological: Positive for speech difficulty and weakness.  Hematological: Bruises/bleeds easily.       Plavix  Psychiatric/Behavioral: Positive for dysphoric mood.       Objective:   Physical Exam Constitutional: NAD. Vital signs reviewed. HENT: Normocephalic.  Atraumatic. Eyes: EOMI. No discharge. Cardiovascular: No JVD.   Respiratory: Normal effort.  No stridor.  GI: Non-distended.   Skin: Warm and dry.  Intact. Psych: Normal mood.  Normal behavior. Musc: No edema in extremities.  No tenderness in extremities. Neuro: Alert She makes eye contact with examiner.   Follows simple commands.   Good awareness of deficits. Motor:  LUE: 4+/5 proximal to distal LLE: 4+/5 proximal to distal LUE: Mild ataxia, unchanged    Assessment & Plan:  Right-handed adult with history of diet-controlled diabetes mellitus, hypertension, hypothyroidism, TIA presents for hospital follow up after receiving CIR for right MCA infarct.  1. 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 Hypertension.    Cont meds  Slightly labile at home per report - believes related to dietary intake  3. 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  Continue meds  Slightly elevated   Follow up with PCP - needs appointment  4.  Gait abnormality  Cont walker/wheelchiar for safety  Initiate therapies - needs to schedule  Meds reviewed Referrals reviewed - needs Neuro All questions answered

## 2020-02-09 ENCOUNTER — Ambulatory Visit: Payer: Medicare HMO | Admitting: Occupational Therapy

## 2020-02-09 ENCOUNTER — Ambulatory Visit: Payer: Medicare HMO | Admitting: Physical Therapy

## 2020-02-11 ENCOUNTER — Ambulatory Visit: Payer: Medicare HMO | Admitting: Occupational Therapy

## 2020-02-13 ENCOUNTER — Ambulatory Visit: Payer: Medicare HMO | Attending: Physician Assistant | Admitting: Physical Therapy

## 2020-02-13 ENCOUNTER — Other Ambulatory Visit: Payer: Self-pay

## 2020-02-13 VITALS — BP 139/60 | HR 92

## 2020-02-13 DIAGNOSIS — M6281 Muscle weakness (generalized): Secondary | ICD-10-CM | POA: Diagnosis present

## 2020-02-13 DIAGNOSIS — R29818 Other symptoms and signs involving the nervous system: Secondary | ICD-10-CM | POA: Diagnosis present

## 2020-02-13 DIAGNOSIS — I69354 Hemiplegia and hemiparesis following cerebral infarction affecting left non-dominant side: Secondary | ICD-10-CM | POA: Insufficient documentation

## 2020-02-13 DIAGNOSIS — R2689 Other abnormalities of gait and mobility: Secondary | ICD-10-CM | POA: Insufficient documentation

## 2020-02-13 DIAGNOSIS — R2681 Unsteadiness on feet: Secondary | ICD-10-CM

## 2020-02-13 NOTE — Therapy (Addendum)
[facility] [address] [place], [place], [address] Phone: [phone]   Fax:  [phone]  Physical Therapy Evaluation  Patient Details  Name: Jill Rose MRN: [id number] Date of Birth: November 09, 1940 Referring Provider (PT): Clide Dales, [place] (being followed by Dr. Allena Katz)   Encounter Date: 02/13/2020   PT End of Session - 02/13/20 1626    Visit Number 1    Number of Visits 17    Date for PT Re-Evaluation 05/13/20   written for 60 day POC   Authorization Type [employer]- $35 co pay per day regardless of discipline    PT Start Time 1538   pt using restroom at start of session   PT Stop Time 1618    PT Time Calculation (min) 40 min    Equipment Utilized During Treatment Gait belt    Activity Tolerance Patient tolerated treatment well;Patient limited by fatigue    Behavior During Therapy [facility] for tasks assessed/performed           Past Medical History:  Diagnosis Date  . Diabetes mellitus without complication (HCC)   . Essential hypertension   . Hypothyroidism   . TIA (transient ischemic attack) 2018    No past surgical history on file.  Vitals:   02/13/20 1544  BP: (!) 139/60  Pulse: 92      Subjective Assessment - 02/13/20 1540    Subjective Pt hospitalized 01/05/20 - diagnosed with R MCA CVA. Received inpatient rehab, discharged from hospital 01/29/20. Reports L leg is still very weak. Walks to the bathroom with the RW. No falls.    Patient is accompained by: Family member   daughter Jill Rose   Pertinent History diabetes mellitus, HTN, hypothyroidism, TIA (2018)    Patient Stated Goals wants to be walking more.    Currently in Pain? No/denies               02/13/20 1545  Assessment  Medical Diagnosis R MCA CVA  Referring Provider (PT) Clide Dales, [place] (being followed by Dr. Allena Katz)  Onset Date/Surgical Date 01/05/20  Hand Dominance Right  Prior Therapy CIR PT and OT    Precautions  Precautions Fall  Balance Screen  Has the patient fallen in the past 6 months No  Has the patient had a decrease in activity level because of a fear of falling?  Yes  Is the patient reluctant to leave their home because of a fear of falling?  No  Home Teaching laboratory technician residence  Living Arrangements Spouse/significant other;Other (Comment) (father)  Available Help at Discharge Family  Type of Home House  Home Access Ramped entrance  Home Layout Two level (moved everything to 1st floor)  Alternate Level Stairs-Number of Steps 17  Home Equipment Wheelchair - manual;Walker - standard;BSC;Tub bench;Grab bars - toilet;Grab bars - tub/shower (got a band to help lift LLE)  Additional Comments daughter here from [place] helping out, has an adjustable bed  Prior Function  Level of Independence Independent  Leisure wants to be able to cook again, go out on the sun deck   Sensation  Light Touch Appears Intact  Proprioception Appears Intact  Coordination  Gross Motor Movements are Fluid and Coordinated No  Coordination and Movement Description slow movement patterns esp with LLE  ROM / Strength  AROM / PROM / Strength Strength  Strength  Left Knee Flexion 4-/5  Left Knee Extension 3+/5  Left Hip Flexion 3-/5  Left Ankle Dorsiflexion 4/5  Strength Assessment Site Hip;Knee;Ankle  Right/Left Hip Right;Left  Right/Left Knee Right;Left  Right/Left Ankle Right  Right Hip Flexion 3-/5  Right Knee Flexion 4-/5  Right Knee Extension 4+/5  Right Ankle Dorsiflexion 4+/5  Bed Mobility  Rolling Left Minimal Assistance - Patient > 75%  Supine to Sit Moderate Assistance - Patient 50-74%  Sit to Supine Minimal Assistance - Patient > 75% (for BLEs)  Transfers  Transfers Sit to Stand;Stand to Sit;Stand Pivot Transfers  Sit to Stand 4: Min guard;4: Min assist  Sit to Stand Details Tactile cues for placement;Verbal cues for technique;Verbal cues for  sequencing  Sit to Stand Details (indicate cue type and reason) one episode of min A standing from wheelchair, pt able to stand from mat table with min guard, needs initial cueing for proper hand placement from surface and on RW  Stand to Sit 4: Min guard;With upper extremity assist  Stand Pivot Transfers 4: Min guard (w/ RW, w/c > mat table)  Ambulation/Gait  Ambulation/Gait Yes  Ambulation/Gait Assistance 4: Min guard  Ambulation Distance (Feet) 20 Feet  Assistive device Rolling walker  Gait Pattern Step-to pattern;Decreased step length - right;Decreased step length - left;Decreased dorsiflexion - left;Decreased dorsiflexion - right;Right foot flat;Left foot flat;Poor foot clearance - left;Poor foot clearance - right;Shuffle  Gait Comments pt taking small shuffled steps, w/c follow from pt's daughter due to pt fatiguing easily with gait         02/15/20 1948  PT Education  Education Details clinical findings, POC  Person(s) Educated Patient;Child(ren) (daughter Jill Rose- [place])  Methods Explanation  Comprehension Verbalized understanding      Objective measurements completed on examination: See above findings.       PT Short Term Goals - 02/15/20 2007      PT SHORT TERM GOAL #1   Title Pt will be independent with initial HEP in order to build upon functional gains made in therapy. ALL STGS DUE 03/14/20    Time 4    Period Weeks    Status New    Target Date 03/14/20      PT SHORT TERM GOAL #2   Title Pt will undergo further assessment of TUG with STG and LTG to be written as appropriate.    Time 4    Period Weeks    Status New      PT SHORT TERM GOAL #3   Title Pt will undergo further assessment of 5x sit <> stand with STG and LTG to be written as appropriate.    Time 4    Period Weeks    Status New      PT SHORT TERM GOAL #4   Title Pt will undergo further assessment of BERG with LTG to be written as appropriate.    Time 4    Period Weeks    Status New      PT SHORT  TERM GOAL #5   Title Pt will ambulate at least 74115' with RW with supervision in order to improve household mobility.    Baseline 20' with RW with min guard    Time 4    Period Weeks    Status New      Additional Short Term Goals   Additional Short Term Goals Yes      PT SHORT TERM GOAL #6   Title Pt will perform stand pivot transfers and sit <> stands with RW with supervision in order to improve functional mobility.    Time 4  Period Weeks    Status New            PT Long Term Goals - 02/15/20 2010      PT LONG TERM GOAL #1   Title Pt will be independent with final HEP in order to build upon functional gains made in therapy. ALL LTGS DUE 04/11/20    Time 8    Period Weeks    Status New    Target Date 04/11/20      PT LONG TERM GOAL #2   Title TUG goal to be written as appropriate to determine fall risk.    Time 8    Period Weeks    Status New      PT LONG TERM GOAL #3   Title BERG goal to be written as appropriate to determine fall risk.    Time 8    Period Weeks    Status New      PT LONG TERM GOAL #4   Title 5x sit <> stand goal to be written as appropriate to demo improved BLE strength.    Time 8    Period Weeks    Status New      PT LONG TERM GOAL #5   Title Pt will ambulate at least 230' with RW vs. LRAD with supervision in order to demo improved gait efficiency.    Time 8    Period Weeks    Status New      Additional Long Term Goals   Additional Long Term Goals Yes      PT LONG TERM GOAL #6   Title Pt will perform all bed mobility with supervision in order to decr caregiver burden.    Time 8    Period Weeks    Status New                     02/15/20 1956  Plan  Clinical Impression Statement Patient is a 79 year old female referred to Neuro OPPT s/p R MCA CVA (discharged from hospital on 01/29/20). 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 The following  deficits were present during the exam: decreased BLE strength, decr endurance, difficulties w/ functional transfers, decr safety awareness with use of RW, gait abnormalities, impaired balance.  Unable to further assess TUG, gait speed, 5x sit <> stand to determine fall risk. However, pt needing min/mod A for bed mobility, min guard/min A for sit <> stands and min guard for stand pivot transfers with use of RW. Pt ambulated 20' with RW with min guard, but needed a w/c follow due to fatigue. 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  Examination-Activity Limitations Bed Mobility;Bend;Locomotion Level;Stairs;Stand;Transfers;Squat  Examination-Participation Restrictions Community Activity  Pt will benefit from skilled therapeutic intervention in order to improve on the following deficits Abnormal gait;Decreased balance;Decreased activity tolerance;Decreased coordination;Decreased endurance;Decreased knowledge of use of DME;Decreased range of motion;Difficulty walking;Decreased strength;Postural dysfunction  Stability/Clinical Decision Making Stable/Uncomplicated  Clinical Decision Making Low  Rehab Potential Good  PT Frequency 2x / week  PT Duration 8 weeks  PT Treatment/Interventions ADLs/Self Care Home Management;Gait training;DME Instruction;Stair training;Functional mobility training;Therapeutic activities;Neuromuscular re-education;Balance training;Therapeutic exercise;Patient/family education;Orthotic Fit/Training;Energy conservation;Passive range of motion  PT Next Visit Plan initial  HEP for seated LE strength, gait and transfer training with RW, assess gait speed, TUG, 5x sit <> stand when able.  Consulted and Agree  with Plan of Care Patient;Family member/caregiver  Family Member Consulted daughter, Jill Rose                   Patient will benefit from skilled therapeutic intervention in order to improve the following deficits and impairments:     Visit Diagnosis: Hemiplegia and hemiparesis following cerebral infarction affecting left non-dominant side (HCC)  Muscle weakness (generalized)  Unsteadiness on feet  Other abnormalities of gait and mobility  Other symptoms and signs involving the nervous system     Problem List Patient Active Problem List   Diagnosis Date Noted  . Abnormality of gait 02/03/2020  . Benign essential HTN   . CKD (chronic kidney disease), stage II   . Hemiparesis affecting left side as late effect of stroke (HCC)   . Labile blood glucose   . Labile blood pressure   . Elevated BUN   . Hyponatremia   . Diabetic peripheral neuropathy (HCC)   . 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 Dyslipidemia   . Class 1 obesity due to excess calories with serious comorbidity and body mass index (BMI) of 30.0 to 30.9 in adult   . Left hemiparesis (HCC)   . Controlled type 2 diabetes mellitus with hyperglycemia (HCC)   . Morbid obesity (HCC)   . Leukocytosis   . History of TIA (transient ischemic attack)   . Acute CVA (cerebrovascular accident) (HCC) 01/06/2020  . Acute ischemic stroke (HCC) 01/05/2020  . Hypertensive emergency 01/26/2017  . TIA (transient ischemic attack) 01/26/2017  . Hypothyroidism 01/26/2017  . Aphasia   . Essential hypertension   . Diabetes mellitus without complication (HCC)     Jill Rose, PT, DPT  02/13/2020, 4:28 PM  [facility] [address] [place], [place], [address] Phone: [phone]   Fax:  [phone]  Name: Jill Rose MRN: [id number] Date of Birth: [birth year]

## 2020-02-15 NOTE — Addendum Note (Signed)
Addended by: Drake Leach on: 02/15/2020 08:21 PM   Modules accepted: Orders

## 2020-02-17 ENCOUNTER — Ambulatory Visit: Payer: Medicare HMO | Admitting: Physical Therapy

## 2020-02-20 ENCOUNTER — Ambulatory Visit: Payer: Medicare HMO | Admitting: Physical Therapy

## 2020-02-26 ENCOUNTER — Other Ambulatory Visit: Payer: Self-pay

## 2020-02-26 ENCOUNTER — Encounter: Payer: Self-pay | Admitting: Physical Therapy

## 2020-02-26 ENCOUNTER — Ambulatory Visit: Payer: Medicare HMO | Attending: Physician Assistant | Admitting: Physical Therapy

## 2020-02-26 DIAGNOSIS — R278 Other lack of coordination: Secondary | ICD-10-CM | POA: Diagnosis present

## 2020-02-26 DIAGNOSIS — R2681 Unsteadiness on feet: Secondary | ICD-10-CM | POA: Diagnosis present

## 2020-02-26 DIAGNOSIS — M6281 Muscle weakness (generalized): Secondary | ICD-10-CM | POA: Diagnosis present

## 2020-02-26 DIAGNOSIS — R4701 Aphasia: Secondary | ICD-10-CM | POA: Insufficient documentation

## 2020-02-26 DIAGNOSIS — I69354 Hemiplegia and hemiparesis following cerebral infarction affecting left non-dominant side: Secondary | ICD-10-CM | POA: Insufficient documentation

## 2020-02-26 DIAGNOSIS — R2689 Other abnormalities of gait and mobility: Secondary | ICD-10-CM | POA: Diagnosis present

## 2020-02-26 DIAGNOSIS — R41841 Cognitive communication deficit: Secondary | ICD-10-CM | POA: Diagnosis present

## 2020-02-26 NOTE — Patient Instructions (Signed)
Access Code: [email or web address] URL: [email or web address] Date: 02/26/2020 Prepared by: Sallyanne Kuster  Exercises Seated Heel Toe Raises - 1 x daily - 5 x weekly - 1 sets - 10 reps Seated Knee Extension with Resistance - 1 x daily - 5 x weekly - 1 sets - 10 reps Seated Hamstring Curl with Anchored Resistance - 1 x daily - 5 x weekly - 1 sets - 10 reps Seated March with Resistance - 1 x daily - 5 x weekly - 1 sets - 10 reps Seated Hip Abduction with Resistance - 1 x daily - 5 x weekly - 1 sets - 10 reps Seated Hip Adduction Isometrics with Ball - 1 x daily - 5 x weekly - 1 sets - 10 reps - 5 hold Sit to Stand with Counter Support - 1 x daily - 5 x weekly - 1 sets - 5 reps

## 2020-02-27 NOTE — Therapy (Signed)
[facility] [address] [place], [place], [address] Phone: [phone]   Fax:  [phone]  Physical Therapy Treatment  Patient Details  Name: Jill SlimMolly L Rose MRN: [id number] Date of Birth: 12/21/1940 Referring Provider (PT): Clide DalesWright, Morgan Dionne, [place] (being followed by Dr. Allena KatzPatel)   Encounter Date: 02/26/2020   PT End of Session - 02/26/20 1321    Visit Number 2    Number of Visits 17    Date for PT Re-Evaluation 05/13/20   written for 60 day POC   Authorization Type [employer] Medicare- $35 co pay per day regardless of discipline    PT Start Time 1317    PT Stop Time 1400    PT Time Calculation (min) 43 min    Equipment Utilized During Treatment Gait belt    Activity Tolerance Patient tolerated treatment well;Patient limited by fatigue    Behavior [facility] Therapy [facility] tasks assessed/performed           Past Medical History:  Diagnosis Date  . Diabetes mellitus without complication (HCC)   . Essential hypertension   . Hypothyroidism   . TIA (transient ischemic attack) 2018    History reviewed. No pertinent surgical history.  There were no vitals filed for this visit.   Subjective Assessment - 02/26/20 1320    Subjective No new complaitns. No falls or pain to report.    Patient is accompained by: Family member    Pertinent History diabetes mellitus, HTN, hypothyroidism, TIA (2018)    Patient Stated Goals wants to be walking more.    Currently in Pain? No/denies    Pain Score 0-No pain                [facility] Adult PT Treatment/Exercise - 02/26/20 1326      Transfers   Transfers Sit to Stand;Stand to Sit;Stand Pivot Transfers    Sit to Stand 4: Min guard    Sit to Stand Details Tactile cues for placement;Verbal cues for technique;Verbal cues for sequencing    Sit to Stand Details (indicate cue type and reason) cues to scoot to edge of surface and for safe hand placement. cues for increased anterior  weight shifting with standing up as well.     Stand to Sit 4: Min guard;With upper extremity assist    Stand to Sit Details (indicate cue type and reason) Verbal cues for sequencing;Verbal cues for safe use of DME/AE    Stand to Sit Details cues to reach back to use arms with sitting down for controlled descent    Stand Pivot Transfers 4: Min guard    Stand Pivot Transfer Details (indicate cue type and reason) with RW, cues on sequencing/technique      Exercises   Exercises Other Exercises    Other Exercises  issued ex's to HEP today. Refer to [facility] for full details. Cues needed for correct ex form and technique. CNA present and education on how to assist pt at home with ex's.           Issued to HEP today:  Access Code: [email or web address] URL: [email or web address] Date: 02/26/2020 Prepared by: Sallyanne KusterKathy Joniqua Sidle  Exercises Seated Heel Toe Raises - 1 x daily - 5 x weekly - 1 sets - 10 reps Seated Knee Extension with Resistance - 1 x daily - 5 x weekly - 1 sets - 10 reps Seated Hamstring Curl with Anchored Resistance - 1 x daily - 5 x weekly - 1 sets - 10 reps Seated  March with Resistance - 1 x daily - 5 x weekly - 1 sets - 10 reps Seated Hip Abduction with Resistance - 1 x daily - 5 x weekly - 1 sets - 10 reps Seated Hip Adduction Isometrics with Ball - 1 x daily - 5 x weekly - 1 sets - 10 reps - 5 hold Sit to Stand with Counter Support - 1 x daily - 5 x weekly - 1 sets - 5 reps        PT Education - 02/26/20 1350    Education Details HEP for strengthening    Person(s) Educated Patient;Caregiver(s)    Methods Explanation;Demonstration;Tactile cues;Verbal cues;Handout    Comprehension Verbalized understanding;Returned demonstration;Verbal cues required;Tactile cues required;Need further instruction            PT Short Term Goals - 02/15/20 2007      PT SHORT TERM GOAL #1   Title Pt will be independent with initial HEP in order to build upon functional gains made in  therapy. ALL STGS DUE 03/14/20    Time 4    Period Weeks    Status New    Target Date 03/14/20      PT SHORT TERM GOAL #2   Title Pt will undergo further assessment of TUG with STG and LTG to be written as appropriate.    Time 4    Period Weeks    Status New      PT SHORT TERM GOAL #3   Title Pt will undergo further assessment of 5x sit <> stand with STG and LTG to be written as appropriate.    Time 4    Period Weeks    Status New      PT SHORT TERM GOAL #4   Title Pt will undergo further assessment of BERG with LTG to be written as appropriate.    Time 4    Period Weeks    Status New      PT SHORT TERM GOAL #5   Title Pt will ambulate at least 27' with RW with supervision in order to improve household mobility.    Baseline 20' with RW with min guard    Time 4    Period Weeks    Status New      Additional Short Term Goals   Additional Short Term Goals Yes      PT SHORT TERM GOAL #6   Title Pt will perform stand pivot transfers and sit <> stands with RW with supervision in order to improve functional mobility.    Time 4    Period Weeks    Status New             PT Long Term Goals - 02/15/20 2010      PT LONG TERM GOAL #1   Title Pt will be independent with final HEP in order to build upon functional gains made in therapy. ALL LTGS DUE 04/11/20    Time 8    Period Weeks    Status New    Target Date 04/11/20      PT LONG TERM GOAL #2   Title TUG goal to be written as appropriate to determine fall risk.    Time 8    Period Weeks    Status New      PT LONG TERM GOAL #3   Title BERG goal to be written as appropriate to determine fall risk.    Time 8    Period Weeks  Status New      PT LONG TERM GOAL #4   Title 5x sit <> stand goal to be written as appropriate to demo improved BLE strength.    Time 8    Period Weeks    Status New      PT LONG TERM GOAL #5   Title Pt will ambulate at least 230' with RW vs. LRAD with supervision in order to demo  improved gait efficiency.    Time 8    Period Weeks    Status New      Additional Long Term Goals   Additional Long Term Goals Yes      PT LONG TERM GOAL #6   Title Pt will perform all bed mobility with supervision in order to decr caregiver burden.    Time 8    Period Weeks    Status New                 Plan - 02/26/20 1321    Clinical Impression Statement Today's skilled session focused on establishment of an HEP to address strengthening. No issues noted or reported with performance in session. The pt is progressing and should benefit from continued PT to progress toward unmet goals.    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    Examination-Activity Limitations Bed Mobility;Bend;Locomotion Level;Stairs;Stand;Transfers;Squat    Examination-Participation Restrictions Community Activity    Stability/Clinical Decision Making Stable/Uncomplicated    Rehab Potential Good    PT Frequency 2x / week    PT Duration 8 weeks    PT Treatment/Interventions ADLs/Self Care Home Management;Gait training;DME Instruction;Stair training;Functional mobility training;Therapeutic activities;Neuromuscular re-education;Balance training;Therapeutic exercise;Patient/family education;Orthotic Fit/Training;Energy conservation;Passive range of motion    PT Next Visit Plan continue to work on LE strengthening, gait and transfer training with RW, assess gait speed, TUG, 5x sit <> stand when able.    Consulted and Agree with Plan of Care Patient;Family member/caregiver    Family Member Consulted CNA Charlotted           Patient will benefit from skilled therapeutic intervention in order to improve the following deficits and impairments:  Abnormal gait, Decreased balance, Decreased activity tolerance, Decreased coordination, Decreased endurance,  Decreased knowledge of use of DME, Decreased range of motion, Difficulty walking, Decreased strength, Postural dysfunction  Visit Diagnosis: Hemiplegia and hemiparesis following cerebral infarction affecting left non-dominant side (HCC)  Muscle weakness (generalized)  Unsteadiness on feet  Other abnormalities of gait and mobility     Problem List Patient Active Problem List   Diagnosis Date Noted  . Abnormality of gait 02/03/2020  . Benign essential HTN   . CKD (chronic kidney disease), stage II   . Hemiparesis affecting left side as late effect of stroke (HCC)   . Labile blood glucose   . Labile blood pressure   . Elevated BUN   . Hyponatremia   . Diabetic peripheral neuropathy (HCC)   . 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 Dyslipidemia   . Class 1 obesity due to excess calories with serious comorbidity and body mass index (BMI) of 30.0 to 30.9 in adult   . Left hemiparesis (HCC)   . Controlled type 2 diabetes mellitus with hyperglycemia (HCC)   . Morbid obesity (HCC)   . Leukocytosis   . History of TIA (transient ischemic attack)   . Acute CVA (cerebrovascular accident) (HCC)  01/06/2020  . Acute ischemic stroke (HCC) 01/05/2020  . Hypertensive emergency 01/26/2017  . TIA (transient ischemic attack) 01/26/2017  . Hypothyroidism 01/26/2017  . Aphasia   . Essential hypertension   . Diabetes mellitus without complication (HCC)     Sallyanne Kuster, PTA, [facility] [address], [address] [place], [place] [address] [phone] 02/27/20, 1:17 PM   Name: JACQULYNN SHAPPELL MRN: [id number] Date of Birth: 08-Jun-1941

## 2020-03-01 ENCOUNTER — Ambulatory Visit: Payer: Medicare HMO | Admitting: Physical Therapy

## 2020-03-04 ENCOUNTER — Encounter: Payer: Self-pay | Admitting: Physical Therapy

## 2020-03-04 ENCOUNTER — Ambulatory Visit: Payer: Medicare HMO | Admitting: Physical Therapy

## 2020-03-04 ENCOUNTER — Other Ambulatory Visit: Payer: Self-pay

## 2020-03-04 DIAGNOSIS — R2681 Unsteadiness on feet: Secondary | ICD-10-CM

## 2020-03-04 DIAGNOSIS — I69354 Hemiplegia and hemiparesis following cerebral infarction affecting left non-dominant side: Secondary | ICD-10-CM

## 2020-03-04 DIAGNOSIS — M6281 Muscle weakness (generalized): Secondary | ICD-10-CM

## 2020-03-04 DIAGNOSIS — R2689 Other abnormalities of gait and mobility: Secondary | ICD-10-CM

## 2020-03-05 NOTE — Therapy (Signed)
[facility] [address] [place], [place], [address] Phone: [phone]   Fax:  [phone]  Physical Therapy Treatment  Patient Details  Name: Jill SlimMolly L Rose MRN: [id number] Date of Birth: 11/04/1940 Referring Provider (PT): Clide DalesWright, Morgan Dionne, [place] (being followed by Dr. Allena KatzPatel)   Encounter Date: 03/04/2020   PT End of Session - 03/04/20 1630    Visit Number 3    Number of Visits 17    Date for PT Re-Evaluation 05/13/20   written for 60 day POC   Authorization Type [employer] Medicare- $35 co pay per day regardless of discipline    PT Start Time 1318    PT Stop Time 1400    PT Time Calculation (min) 42 min    Equipment Utilized During Treatment Gait belt    Activity Tolerance Patient tolerated treatment well;Patient limited by fatigue    Behavior During Therapy [facility] for tasks assessed/performed           Past Medical History:  Diagnosis Date  . Diabetes mellitus without complication (HCC)   . Essential hypertension   . Hypothyroidism   . TIA (transient ischemic attack) 2018    History reviewed. No pertinent surgical history.  There were no vitals filed for this visit.   Subjective Assessment - 03/04/20 1321    Subjective No new complaitns. No falls or pain to report. Reports that her walker keeps tipping over when she stands with it. On further investigation it appears she is pulling up on it. Both pt and CNA report HEP is going well.    Patient is accompained by: Family member   pt's CNA   Pertinent History diabetes mellitus, HTN, hypothyroidism, TIA (2018)    Patient Stated Goals wants to be walking more.    Currently in Pain? No/denies    Pain Score 0-No pain                [facility] Adult PT Treatment/Exercise - 03/04/20 1326      Transfers   Transfers Sit to Stand;Stand to Sit    Sit to Stand 4: Min guard;With upper extremity assist;From chair/3-in-1;From bed    Sit to Stand Details Verbal cues for  sequencing;Verbal cues for technique;Verbal cues for safe use of DME/AE    Sit to Stand Details (indicate cue type and reason) cues to scoot closer to edge of surface, for increased anterior weight shifitng, and for safe hand placement with standing (pt trying to pull up on walker).    Stand to Sit 4: Min guard;With upper extremity assist;To bed;To chair/3-in-1    Stand to Sit Details (indicate cue type and reason) Verbal cues for sequencing;Verbal cues for precautions/safety;Verbal cues for safe use of DME/AE;Verbal cues for technique    Stand to Sit Details cues to reach back and use UE to controll descent with sittign down     Stand Pivot Transfers 4: Min guard    Stand Pivot Transfer Details (indicate cue type and reason) with RW to mat table with cues on sequencing and to advance feet.     Number of Reps 2 sets;Other reps (comment)   5 reps   Comments performed sit<>stands in session with cues on correct techique. reminder cues needed throughout session.       Ambulation/Gait   Ambulation/Gait Yes    Ambulation/Gait Assistance 4: Min guard    Ambulation Distance (Feet) 15 Feet   x1, 40 x1, 55 x1   Assistive device Rolling walker  Gait Pattern Step-to pattern;Decreased step length - right;Decreased step length - left;Decreased dorsiflexion - left;Decreased dorsiflexion - right;Right foot flat;Left foot flat;Poor foot clearance - left;Poor foot clearance - right;Shuffle;Step-through pattern;Decreased stance time - right;Decreased stride length    Ambulation Surface Level;Indoor    Pre-Gait Activities standing with RW: fwd/bwd stepping for 2 sets of 5 reps bil LE's, cues for increased step length with right LE.       Exercises   Exercises Other Exercises    Other Exercises  standing with UE support on sturdy surface: heel raises for 10 reps, then alternating hip abduction for 10 reps each side. min guard assist for balance with cues on posture/ex form.                 PT Short Term  Goals - 02/15/20 2007      PT SHORT TERM GOAL #1   Title Pt will be independent with initial HEP in order to build upon functional gains made in therapy. ALL STGS DUE 03/14/20    Time 4    Period Weeks    Status New    Target Date 03/14/20      PT SHORT TERM GOAL #2   Title Pt will undergo further assessment of TUG with STG and LTG to be written as appropriate.    Time 4    Period Weeks    Status New      PT SHORT TERM GOAL #3   Title Pt will undergo further assessment of 5x sit <> stand with STG and LTG to be written as appropriate.    Time 4    Period Weeks    Status New      PT SHORT TERM GOAL #4   Title Pt will undergo further assessment of BERG with LTG to be written as appropriate.    Time 4    Period Weeks    Status New      PT SHORT TERM GOAL #5   Title Pt will ambulate at least 79' with RW with supervision in order to improve household mobility.    Baseline 20' with RW with min guard    Time 4    Period Weeks    Status New      Additional Short Term Goals   Additional Short Term Goals Yes      PT SHORT TERM GOAL #6   Title Pt will perform stand pivot transfers and sit <> stands with RW with supervision in order to improve functional mobility.    Time 4    Period Weeks    Status New             PT Long Term Goals - 02/15/20 2010      PT LONG TERM GOAL #1   Title Pt will be independent with final HEP in order to build upon functional gains made in therapy. ALL LTGS DUE 04/11/20    Time 8    Period Weeks    Status New    Target Date 04/11/20      PT LONG TERM GOAL #2   Title TUG goal to be written as appropriate to determine fall risk.    Time 8    Period Weeks    Status New      PT LONG TERM GOAL #3   Title BERG goal to be written as appropriate to determine fall risk.    Time 8    Period Weeks    Status New  PT LONG TERM GOAL #4   Title 5x sit <> stand goal to be written as appropriate to demo improved BLE strength.    Time 8     Period Weeks    Status New      PT LONG TERM GOAL #5   Title Pt will ambulate at least 230' with RW vs. LRAD with supervision in order to demo improved gait efficiency.    Time 8    Period Weeks    Status New      Additional Long Term Goals   Additional Long Term Goals Yes      PT LONG TERM GOAL #6   Title Pt will perform all bed mobility with supervision in order to decr caregiver burden.    Time 8    Period Weeks    Status New                 Plan - 03/04/20 1630    Clinical Impression Statement Today's skilled session initially focused on transfer training with reminder cues needed throughout session. Remainder of session focused on gait training. Pt able to progress to reciprocal steps once PTA faciliated walker advancement with facilitation for increased right weight shifting in stance. End of session continued to address LE strengthening. The pt is progressing toward goals and should benefit from continued PT to progress toward unmet goals.    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    Examination-Activity Limitations Bed Mobility;Bend;Locomotion Level;Stairs;Stand;Transfers;Squat    Examination-Participation Restrictions Community Activity    Stability/Clinical Decision Making Stable/Uncomplicated    Rehab Potential Good    PT Frequency 2x / week    PT Duration 8 weeks    PT Treatment/Interventions ADLs/Self Care Home Management;Gait training;DME Instruction;Stair training;Functional mobility training;Therapeutic activities;Neuromuscular re-education;Balance training;Therapeutic exercise;Patient/family education;Orthotic Fit/Training;Energy conservation;Passive range of motion    PT Next Visit Plan continue to work on LE strengthening, gait and transfer training with RW, assess gait speed, TUG, 5x sit <> stand when  able.    Consulted and Agree with Plan of Care Patient;Family member/caregiver    Family Member Consulted CNA Charlotted           Patient will benefit from skilled therapeutic intervention in order to improve the following deficits and impairments:  Abnormal gait, Decreased balance, Decreased activity tolerance, Decreased coordination, Decreased endurance, Decreased knowledge of use of DME, Decreased range of motion, Difficulty walking, Decreased strength, Postural dysfunction  Visit Diagnosis: Hemiplegia and hemiparesis following cerebral infarction affecting left non-dominant side (HCC)  Muscle weakness (generalized)  Unsteadiness on feet  Other abnormalities of gait and mobility     Problem List Patient Active Problem List   Diagnosis Date Noted  . Abnormality of gait 02/03/2020  . Benign essential HTN   . CKD (chronic kidney disease), stage II   . Hemiparesis affecting left side as late effect of stroke (HCC)   . Labile blood glucose   . Labile blood pressure   . Elevated BUN   . Hyponatremia   . Diabetic peripheral neuropathy (HCC)   . 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 Dyslipidemia   . Class 1 obesity due to excess calories with serious comorbidity and body mass index (BMI) of 30.0 to 30.9 in adult   . Left hemiparesis (HCC)   . Controlled type 2 diabetes mellitus with hyperglycemia (HCC)   .  Morbid obesity (HCC)   . Leukocytosis   . History of TIA (transient ischemic attack)   . Acute CVA (cerebrovascular accident) (HCC) 01/06/2020  . Acute ischemic stroke (HCC) 01/05/2020  . Hypertensive emergency 01/26/2017  . TIA (transient ischemic attack) 01/26/2017  . Hypothyroidism 01/26/2017  . Aphasia   . Essential hypertension   . Diabetes mellitus without complication (HCC)    Sallyanne Kuster, PTA, [facility] [address] [place], [place] [address] [phone] 03/05/20, 2:22 PM   Name: TESS POTTS MRN:  [id number] Date of Birth: December 17, 1940

## 2020-03-09 ENCOUNTER — Other Ambulatory Visit: Payer: Self-pay

## 2020-03-09 ENCOUNTER — Ambulatory Visit: Payer: Medicare HMO | Admitting: Physical Therapy

## 2020-03-09 ENCOUNTER — Encounter: Payer: Self-pay | Admitting: Physical Therapy

## 2020-03-09 DIAGNOSIS — R2689 Other abnormalities of gait and mobility: Secondary | ICD-10-CM

## 2020-03-09 DIAGNOSIS — I69354 Hemiplegia and hemiparesis following cerebral infarction affecting left non-dominant side: Secondary | ICD-10-CM | POA: Diagnosis not present

## 2020-03-09 DIAGNOSIS — R2681 Unsteadiness on feet: Secondary | ICD-10-CM

## 2020-03-09 DIAGNOSIS — M6281 Muscle weakness (generalized): Secondary | ICD-10-CM

## 2020-03-09 NOTE — Therapy (Signed)
[facility] [address] [place], [place], [address] Phone: [phone]   Fax:  [phone]  Physical Therapy Treatment  Patient Details  Name: Jill Rose MRN: [id number] Date of Birth: May 13, 1941 Referring Provider (PT): Clide Dales, [place] (being followed by Dr. Allena Katz)   Encounter Date: 03/09/2020   PT End of Session - 03/09/20 1243    Visit Number 4    Number of Visits 17    Date for PT Re-Evaluation 05/13/20   written for 60 day POC   Authorization Type [employer]- $35 co pay per day regardless of discipline    PT Start Time 1154   pt arrived late   PT Stop Time 1230    PT Time Calculation (min) 36 min    Equipment Utilized During Treatment Gait belt    Activity Tolerance Patient tolerated treatment well;Patient limited by fatigue    Behavior During Therapy [facility] for tasks assessed/performed           Past Medical History:  Diagnosis Date   Diabetes mellitus without complication (HCC)    Essential hypertension    Hypothyroidism    TIA (transient ischemic attack) 2018    History reviewed. No pertinent surgical history.  There were no vitals filed for this visit.   Subjective Assessment - 03/09/20 1157    Subjective No falls, has been doing the HEP at home with the CNA, only does a little bit of walking at home.    Patient is accompained by: Family member   pt's CNA   Pertinent History diabetes mellitus, HTN, hypothyroidism, TIA (2018)    Patient Stated Goals wants to be walking more.    Currently in Pain? No/denies                             [facility] Adult PT Treatment/Exercise - 03/09/20 1203      Transfers   Transfers Sit to Stand;Stand to Sit    Sit to Stand 4: Min guard;With upper extremity assist;From chair/3-in-1;From bed    Sit to Stand Details Verbal cues for sequencing;Verbal cues for technique;Verbal cues for safe use of DME/AE    Sit to Stand Details  (indicate cue type and reason) cues to scoot out towards edge and proper UE placement (one on mat and one on RW) and not to pull up on walker    Five time sit to stand comments  53.79 seconds from mat table    Stand to Sit 4: Min guard;With upper extremity assist;To bed;To chair/3-in-1    Stand to Sit Details (indicate cue type and reason) Verbal cues for sequencing;Verbal cues for precautions/safety;Verbal cues for safe use of DME/AE;Verbal cues for technique    Stand to Sit Details cues to reach posteriorly and feel surface behind pt with BLE prior to sitting    Stand Pivot Transfers 4: Min guard    Stand Pivot Transfer Details (indicate cue type and reason) cues for sequencing, pt with tendency to take more shuffled steps      Ambulation/Gait   Ambulation/Gait Yes    Ambulation/Gait Assistance 4: Min assist;4: Min guard    Ambulation/Gait Assistance Details w/c follow for safety, needing one prolonged seated rest break after initial bout. assist and cues to steer/navigate RW for incr step length with RLE, verbal cues for larger step length with RLE throughout gait. BP after gait: 159/74, HR at 97 bpm, O2 at 100%  Ambulation Distance (Feet) 75 Feet   x1, 55' x 1   Assistive device Rolling walker    Gait Pattern Step-to pattern;Decreased step length - right;Decreased step length - left;Decreased dorsiflexion - left;Decreased dorsiflexion - right;Right foot flat;Left foot flat;Poor foot clearance - left;Poor foot clearance - right;Shuffle;Step-through pattern;Decreased stance time - right;Decreased stride length    Ambulation Surface Level;Indoor                    PT Short Term Goals - 03/09/20 1435      PT SHORT TERM GOAL #1   Title Pt will be independent with initial HEP in order to build upon functional gains made in therapy. ALL STGS DUE 03/14/20    Time 4    Period Weeks    Status New    Target Date 03/14/20      PT SHORT TERM GOAL #2   Title Pt will undergo further  assessment of TUG with STG and LTG to be written as appropriate.    Time 4    Period Weeks    Status New      PT SHORT TERM GOAL #3   Title Pt will decr 5x sit <> stand time with single UE support to 45 seconds or less in order to demo improved BLE strength.    Baseline 53.79 seconds on 03/09/20    Time 4    Period Weeks    Status Revised      PT SHORT TERM GOAL #4   Title Pt will undergo further assessment of BERG with LTG to be written as appropriate.    Time 4    Period Weeks    Status New      PT SHORT TERM GOAL #5   Title Pt will ambulate at least 59' with RW with supervision in order to improve household mobility.    Baseline 20' with RW with min guard    Time 4    Period Weeks    Status New      PT SHORT TERM GOAL #6   Title Pt will perform stand pivot transfers and sit <> stands with RW with supervision in order to improve functional mobility.    Time 4    Period Weeks    Status New             PT Long Term Goals - 02/15/20 2010      PT LONG TERM GOAL #1   Title Pt will be independent with final HEP in order to build upon functional gains made in therapy. ALL LTGS DUE 04/11/20    Time 8    Period Weeks    Status New    Target Date 04/11/20      PT LONG TERM GOAL #2   Title TUG goal to be written as appropriate to determine fall risk.    Time 8    Period Weeks    Status New      PT LONG TERM GOAL #3   Title BERG goal to be written as appropriate to determine fall risk.    Time 8    Period Weeks    Status New      PT LONG TERM GOAL #4   Title 5x sit <> stand goal to be written as appropriate to demo improved BLE strength.    Time 8    Period Weeks    Status New      PT LONG TERM GOAL #5  Title Pt will ambulate at least 230' with RW vs. LRAD with supervision in order to demo improved gait efficiency.    Time 8    Period Weeks    Status New      Additional Long Term Goals   Additional Long Term Goals Yes      PT LONG TERM GOAL #6   Title Pt  will perform all bed mobility with supervision in order to decr caregiver burden.    Time 8    Period Weeks    Status New                 Plan - 03/09/20 1436    Clinical Impression Statement Pt able to improve walking distance today with RW, with BP WFL after performing. Cues needed for incr step length with RLE for incr weight shift to L, with therapist helping to facilitate RW steering. Pt performing 5x sit <> stand today in 53.79 seconds with LTG written as appropriate.    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    Examination-Activity Limitations Bed Mobility;Bend;Locomotion Level;Stairs;Stand;Transfers;Squat    Examination-Participation Restrictions Community Activity    Stability/Clinical Decision Making Stable/Uncomplicated    Rehab Potential Good    PT Frequency 2x / week    PT Duration 8 weeks    PT Treatment/Interventions ADLs/Self Care Home Management;Gait training;DME Instruction;Stair training;Functional mobility training;Therapeutic activities;Neuromuscular re-education;Balance training;Therapeutic exercise;Patient/family education;Orthotic Fit/Training;Energy conservation;Passive range of motion    PT Next Visit Plan continue to work on LE strengthening, gait and transfer training with RW, assess gait speed, TUG, when able.    Consulted and Agree with Plan of Care Patient;Family member/caregiver    Family Member Consulted CNA Charlotted           Patient will benefit from skilled therapeutic intervention in order to improve the following deficits and impairments:  Abnormal gait, Decreased balance, Decreased activity tolerance, Decreased coordination, Decreased endurance, Decreased knowledge of use of DME, Decreased range of motion, Difficulty walking, Decreased strength, Postural dysfunction  Visit  Diagnosis: Hemiplegia and hemiparesis following cerebral infarction affecting left non-dominant side (HCC)  Muscle weakness (generalized)  Unsteadiness on feet  Other abnormalities of gait and mobility     Problem List Patient Active Problem List   Diagnosis Date Noted   Abnormality of gait 02/03/2020   Benign essential HTN    CKD (chronic kidney disease), stage II    Hemiparesis affecting left side as late effect of stroke (HCC)    Labile blood glucose    Labile blood pressure    Elevated BUN    Hyponatremia    Diabetic peripheral neuropathy (HCC)    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   Dyslipidemia    Class 1 obesity due to excess calories with serious comorbidity and body mass index (BMI) of 30.0 to 30.9 in adult    Left hemiparesis (HCC)    Controlled type 2 diabetes mellitus with hyperglycemia (HCC)    Morbid obesity (HCC)    Leukocytosis    History of TIA (transient ischemic attack)    Acute CVA (cerebrovascular accident) (HCC) 01/06/2020   Acute ischemic stroke (HCC) 01/05/2020   Hypertensive emergency 01/26/2017   TIA (transient ischemic attack) 01/26/2017   Hypothyroidism 01/26/2017   Aphasia    Essential hypertension    Diabetes mellitus without complication (HCC)     Briseis Aguilera N Tiffany Talarico,  PT, DPT  03/09/2020, 2:37 PM  [facility] [address] [place], [place], [address] Phone: [phone]   Fax:  [phone]  Name: [name] MRN: [id number] Date of Birth: 06/30/1941

## 2020-03-12 ENCOUNTER — Ambulatory Visit: Payer: Medicare HMO | Admitting: Physical Therapy

## 2020-03-16 ENCOUNTER — Other Ambulatory Visit: Payer: Self-pay

## 2020-03-16 ENCOUNTER — Ambulatory Visit: Payer: Medicare HMO | Admitting: Occupational Therapy

## 2020-03-16 ENCOUNTER — Ambulatory Visit: Payer: Medicare HMO | Admitting: Speech Pathology

## 2020-03-16 DIAGNOSIS — M6281 Muscle weakness (generalized): Secondary | ICD-10-CM

## 2020-03-16 DIAGNOSIS — I69354 Hemiplegia and hemiparesis following cerebral infarction affecting left non-dominant side: Secondary | ICD-10-CM | POA: Diagnosis not present

## 2020-03-16 DIAGNOSIS — R4701 Aphasia: Secondary | ICD-10-CM

## 2020-03-16 DIAGNOSIS — R278 Other lack of coordination: Secondary | ICD-10-CM

## 2020-03-16 DIAGNOSIS — R41841 Cognitive communication deficit: Secondary | ICD-10-CM

## 2020-03-16 DIAGNOSIS — R2681 Unsteadiness on feet: Secondary | ICD-10-CM

## 2020-03-16 NOTE — Therapy (Signed)
[facility] [address] [place], [place], [address] Phone: [phone]   Fax:  [phone]  Occupational Therapy Evaluation  Patient Details  Name: Jill Rose MRN: [id number] Date of Birth: [birth year] No data recorded  Encounter Date: 03/16/2020   OT End of Session - 03/16/20 1503    Visit Number 1    Number of Visits 17    Date for OT Re-Evaluation 05/16/20    Authorization Type [employer] MCR    Authorization - Visit Number 1    Authorization - Number of Visits 10    Progress Note Due on Visit 10    OT Start Time 1110    OT Stop Time 1150    OT Time Calculation (min) 40 min    Activity Tolerance Patient tolerated treatment well    Behavior During Therapy [facility] for tasks assessed/performed           Past Medical History:  Diagnosis Date  . Diabetes mellitus without complication (HCC)   . Essential hypertension   . Hypothyroidism   . TIA (transient ischemic attack) 2018    No past surgical history on file.  There were no vitals filed for this visit.   Subjective Assessment - 03/16/20 1114    Pertinent History Rt MCA CVA 01/05/20. PMH: HTN, HLD, T2DM, Diabetic neuropathy    Limitations no driving, fall risk, HOH    Patient Stated Goals return to cooking    Currently in Pain? No/denies             [facility] OT Assessment - 03/16/20 0001      Assessment   Medical Diagnosis R MCA CVA    Onset Date/Surgical Date 01/05/20    Hand Dominance Right    Prior Therapy CIR PT and OT      Precautions   Precautions Fall    Precaution Comments no driving   Pt was not driving prior to stroke     Balance Screen   Has the patient fallen in the past 6 months No      Home  Environment   Bathroom Shower/Tub Walk-in Shower   shower seat   Additional Comments Pt lives w/ spouse in 2 level home w/ ramp to enter. DME: w/c, walker, BSC, shower seat. Pt/family reports CNA comes prior to appointments to help pt get ready  however did not come today    Lives With Spouse      Prior Function   Level of Independence Independent    Vocation Unemployed    Leisure wants to be able to cook again, go out on the sun deck       ADL   Eating/Feeding Independent    Grooming Minimal assistance   only for styling hair   Upper Body Bathing Minimal assistance   help w/ rinsing using hand held shower   Lower Body Bathing Minimal assistance   assist rinsing w/ hand held shower   Upper Body Dressing Moderate assistance    Lower Body Dressing Maximal assistance    Toilet Transfer Modified independent    Toileting - Clothing Manipulation Moderate assistance    Where Assessed - Toileting Clothing Manipulationn Moderate assistance   for perineal care w/ BM   Tub/Shower Transfer Minimal assistance      IADL   Shopping Completely unable to shop    Light Housekeeping --   Pt has hired housekeeper 1x/wk    Meal Prep Needs to have meals prepared and served  Community Mobility Relies on family or friends for transportation   Pt did not drive before     Mobility   Mobility Status Comments w/c for community, walker in house      Written Expression   Dominant Hand Right    Handwriting --   unchanged     Vision - History   Baseline Vision Wears glasses only for reading    Visual History --   sx to correct cataract   Additional Comments denies change in vision since stroke      Cognition   Cognition Comments Reports word finding difficulties - see speech eval for details. Pt is also HOH - noted mild difficulty following instructions      Observation/Other Assessments   Observations HOH      Sensation   Light Touch Appears Intact    Proprioception Appears Intact      Coordination   9 Hole Peg Test Right;Left    Right 9 Hole Peg Test 39.07 sec    Left 9 Hole Peg Test 48.59 sec      Edema   Edema none in UE's      ROM / Strength   AROM / PROM / Strength AROM;Strength      AROM   Overall AROM Comments BUE AROM  WFL's except Lt shoulder approx 110* and sup 75%      Strength   Overall Strength Comments RUE MMT grossly 5/5, LUE grossly 3-/5 at shoulder      Hand Function   Right Hand Grip (lbs) 33.7 lbs    Left Hand Grip (lbs) 38.5 lbs                             OT Short Term Goals - 03/16/20 1510      OT SHORT TERM GOAL #1   Title Independent with coordination HEP Lt hand    Time 4    Period Weeks    Status New      OT SHORT TERM GOAL #2   Title Pt to perform UE dressing with min assist or less and LE dressing with mod assist or less    Time 4    Period Weeks    Status New      OT SHORT TERM GOAL #3   Title Pt to perform simple snack prep/cold meal prep standing level with DME and supervision prn    Time 4    Period Weeks    Status New      OT SHORT TERM GOAL #4   Title Pt to perform sustained high level reaching LUE for endurance    Time 4    Period Weeks    Status New      OT SHORT TERM GOAL #5   Title Pt to return to putting up/styling hair mod I level    Time 4    Period Weeks    Status New             OT Long Term Goals - 03/16/20 1518      OT LONG TERM GOAL #1   Title Independent with strengthening HEP for LUE    Time 8    Period Weeks    Status New      OT LONG TERM GOAL #2   Title Improve coordination Lt hand as evidenced by performing 9 hole peg test in 41 sec or less    Baseline 48.59 sec  Time 8    Period Weeks    Status New      OT LONG TERM GOAL #3   Title Pt to be mod I level with all BADLS    Time 8    Period Weeks    Status New      OT LONG TERM GOAL #4   Title Pt to cook simple meal with DME prn and distant supervision    Time 8    Period Weeks    Status New      OT LONG TERM GOAL #5   Title Pt to perform light cleaning tasks (washing dishes, folding laundry, etc) mod I level    Time 8    Period Weeks    Status New                 Plan - 03/16/20 1504    Clinical Impression Statement Pt is a 79  y.o. female who presents to OPOT with Rt MCA CVA on 01/05/20. Pt currently with decreased: balance, coordination, LUE strength, and lacks full shoulder ROM LUE. Pt would benefit from O.T. to address these deficits and increase independence with ADLS/IADLS.    OT Occupational Profile and History Detailed Assessment- Review of Records and additional review of physical, cognitive, psychosocial history related to current functional performance    Occupational performance deficits (Please refer to evaluation for details): ADL's;IADL's    Body Structure / Function / Physical Skills ADL;ROM;Balance;Body mechanics;Mobility;Flexibility;Strength;Coordination;FMC;UE functional use;Decreased knowledge of use of DME    Rehab Potential Good    Clinical Decision Making Several treatment options, min-mod task modification necessary    Comorbidities Affecting Occupational Performance: May have comorbidities impacting occupational performance    Modification or Assistance to Complete Evaluation  Min-Moderate modification of tasks or assist with assess necessary to complete eval    OT Frequency 2x / week    OT Duration 8 weeks   Plus eval   OT Treatment/Interventions Self-care/ADL training;Therapeutic exercise;Functional Mobility Training;Neuromuscular education;Manual Therapy;Therapeutic activities;Coping strategies training;DME and/or AE instruction;Cognitive remediation/compensation;Moist Heat;Passive range of motion;Patient/family education    Plan coordination HEP    Consulted and Agree with Plan of Care Patient;Family member/caregiver    Family Member Consulted sister           Patient will benefit from skilled therapeutic intervention in order to improve the following deficits and impairments:   Body Structure / Function / Physical Skills: ADL, ROM, Balance, Body mechanics, Mobility, Flexibility, Strength, Coordination, FMC, UE functional use, Decreased knowledge of use of DME       Visit  Diagnosis: Hemiplegia and hemiparesis following cerebral infarction affecting left non-dominant side (HCC)  Muscle weakness (generalized)  Unsteadiness on feet  Other lack of coordination    Problem List Patient Active Problem List   Diagnosis Date Noted  . Abnormality of gait 02/03/2020  . Benign essential HTN   . CKD (chronic kidney disease), stage II   . Hemiparesis affecting left side as late effect of stroke (HCC)   . Labile blood glucose   . Labile blood pressure   . Elevated BUN   . Hyponatremia   . Diabetic peripheral neuropathy (HCC)   . 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 Dyslipidemia   . Class 1 obesity due to excess calories with serious comorbidity and body mass index (BMI) of 30.0 to 30.9 in adult   . Left hemiparesis (HCC)   . Controlled type 2 diabetes mellitus with hyperglycemia (HCC)   . Morbid  obesity (HCC)   . Leukocytosis   . History of TIA (transient ischemic attack)   . Acute CVA (cerebrovascular accident) (HCC) 01/06/2020  . Acute ischemic stroke (HCC) 01/05/2020  . Hypertensive emergency 01/26/2017  . TIA (transient ischemic attack) 01/26/2017  . Hypothyroidism 01/26/2017  . Aphasia   . Essential hypertension   . Diabetes mellitus without complication (HCC)     Kelli Churn, OTR/L 03/16/2020, 3:20 PM  [facility] [address] [place], [place], [address] Phone: [phone]   Fax:  [phone]  Name: Jill Rose MRN: [id number] Date of Birth: 27-Dec-1940

## 2020-03-17 NOTE — Therapy (Signed)
[facility] [address] [place], [place], [address] Phone: [phone]   Fax:  [phone]  Speech Language Pathology Evaluation  Patient Details  Name: Jill Rose MRN: [id number] Date of Birth: 07/12/1941 Referring Provider (SLP): [name], MD   Encounter Date: 03/16/2020   End of Session - 03/16/20 1349    Visit Number 1    Number of Visits 17    Date for SLP Re-Evaluation 05/15/20    SLP Start Time 1149    SLP Stop Time  1238    SLP Time Calculation (min) 49 min    Activity Tolerance Patient tolerated treatment well           Past Medical History:  Diagnosis Date  . Diabetes mellitus without complication (HCC)   . Essential hypertension   . Hypothyroidism   . TIA (transient ischemic attack) 2018    No past surgical history on file.  There were no vitals filed for this visit.   Subjective Assessment - 03/16/20 1151    Subjective "I don't remember everything."    Patient is accompained by: Family member   sister Jill Rose   Currently in Pain? No/denies              SLP Evaluation [facility] - 03/16/20 1151      SLP Visit Information   SLP Received On 03/16/20    Referring Provider (SLP) [name], MD    Onset Date 01/05/20 CVA   02/03/20 referral   Medical Diagnosis R MCA CVA      Subjective   Subjective "I can see things in my mind but I don't feel like getting the words out."    Patient/Family Stated Goal improve wordfinding and memory      General Information   HPI Jill Rose is a 79 year old right-handed female referred for ST cognitive-linguistic evaluation s/p R CVA (corona radiata, posterior temporal and occipital lobes) on 01/05/20. MRI also showed remote lacunar infarcts, left thalamus and left paramedian pons. History also noted for diabetes mellitus, hypertension, hypothyroidism, TIA. Admitted to [facility] 01/05/20 and subsequently CIR 6/30-7/14/21, dc with rec for 24/7  supervision.    Behavioral/Cognition alert, pleasant, cooperative    Mobility Status arrived in wheelchair      Balance Screen   Has the patient fallen in the past 6 months No      Prior Functional Status   Cognitive/Linguistic Baseline Within functional limits    Type of Home House     Lives With Spouse    Available Support Family;Personal care attendant    Vocation Retired   was a Glass blower/designer, previously     Copy Status Impaired/Different from baseline    Attention Selective;Alternating    Selective Attention Impaired    Selective Attention Impairment Verbal complex;Functional complex   symbol cancellation 11/12 correct, but 9 incorrectly cancell   Alternating Attention Impaired    Alternating Attention Impairment --   trailmaking 1/10   Memory Impaired    Memory Impairment Storage deficit;Decreased short term memory    Decreased Short Term Memory --   4/6 design memory, reports she "forgets things."   Awareness Impaired    Awareness Impairment Emergent impairment;Anticipatory impairment    Problem Solving Impaired    Problem Solving Impairment --   needed assistance to correct errors   Executive Function Organizing    Organizing Impaired   0 on mazes task (starting mid- maze, poor  planning)   Organizing Impairment --   clock drawing with poorly spaced numbers, no hands initially   Behaviors Impulsive   began tasks prematurely several times     Auditory Comprehension   Overall Auditory Comprehension Impaired    Commands Impaired    One Step Basic Commands 75-100% accurate    Multistep Basic Commands 75-100% accurate   80%, omits portions of instructions even with repetition   Complex Commands 75-100% accurate   80%, needs frequent repetition   Conversation Moderately complex    Other Conversation Comments suspect HOH, but also attention impacts auditory comprehension    Interfering Components Attention;Hearing;Processing speed     EffectiveTechniques Extra processing time;Repetition;Stressing words;Increased volume;Pausing      Visual Recognition/Discrimination   Discrimination Not tested      Reading Comprehension   Reading Status Not tested      Expression   Primary Mode of Expression Verbal      Verbal Expression   Overall Verbal Expression Impaired    Initiation No impairment    Automatic Speech Name;Social Response    Level of Generative/Spontaneous Verbalization Conversation    Naming Impairment    Divergent 50-74% accurate   6 animals in 60 seconds, 2 m words in 60 sec   Other Naming Comments dysnomia ("Bambi"/deer), anomia x2 in conversation today    Verbal Errors Semantic paraphasias;Aware of errors    Pragmatics No impairment    Effective Techniques Open ended questions;Other (Comment)   extra time   Non-Verbal Means of Communication Not applicable      Written Expression   Dominant Hand Right    Written Expression Not tested      Oral Motor/Sensory Function   Overall Oral Motor/Sensory Function Other (comment)   no dysarthria noted; deferred for masking     Motor Speech   Overall Motor Speech Appears within functional limits for tasks assessed      Standardized Assessments   Standardized Assessments  Cognitive Linguistic Quick Test      Cognitive Linguistic Quick Test (Ages 18-69)   Attention Moderate   48/215   Memory Mild   139/185   Executive Function Severe   6/40, clock drawing 7/13 (mod)   Language Mild   27/37   Visuospatial Skills Moderate   25/105   Severity Rating Total 11    Composite Severity Rating 9.4                           SLP Education - 03/16/20 1349    Education Details deficit areas, proposed ST goals, binder for therapies    Person(s) Educated Patient;Other (comment)   sister   Methods Explanation    Comprehension Verbalized understanding;Need further instruction            SLP Short Term Goals - 03/16/20 1230     SLP SHORT TERM GOAL  #1   Title pt will *attempt to access* her memory system for simple organization of discipline-specific items, or for temporal orientation x5 sessions    Time 4    Period Weeks    Status New      SLP SHORT TERM GOAL #2   Title pt will demo knowledge of items in her memory system/notebook by correctly accessing them in 2 sessions    Time 4    Period Weeks    Status New      SLP SHORT TERM GOAL #3   Title pt will demonstrate error  awareness in therapy tasks 75% with min cues for re-check in 3 sessions    Time 4    Period Weeks    Status New      SLP SHORT TERM GOAL #4   Title pt will name 8 items for simple categories x 3 sessions    Time 4    Period Weeks    Status New            SLP Long Term Goals - 03/16/20 1230     SLP LONG TERM GOAL #1   Title pt will demo correct usage of a memory compensation system for appointment and/or logging blood pressure and blood sugar, daily schedules in 3 sessions    Time 8    Period Weeks    Status New      SLP LONG TERM GOAL #2   Title Pt will maintain selective attention in functional task for 8 minutes in mod noisy environment with rare min cues x 2 sessions.    Time 8    Period Weeks    Status New      SLP LONG TERM GOAL #3   Title pt will demo anticipatory awareness regarding deficit areas, independently, in 3 sessions      SLP LONG TERM GOAL #4   Title pt will use compensations for anomia in 8 minutes simple conversation x3 visits    Time 8    Period Weeks    Status New            Plan - 03/16/20 1230    Clinical Impression Statement Jill Rose presents with overall moderate cognitive communication deficits. Difficulties with wordfinding x2 in conversation and during divergent naming tasks today. Completed Cognitive Linguistic Quick Test with findings of mild deficits in memory and language, moderate deficits in attention, visuospatial, and severe executive function impairment. Pt impulsive, often beginning tasks before  instructions completed. At times she followed complex commands or answered questions incorrectly; even with repetition and clear speech as she reports HOH (hearing has not been assessed, pt may benefit from this). Suspect attention, primarily, and slow processing contributes to this difficulty. Patient was unaware of errors ~80% of the time. Prior to her CVA pt enjoyed cooking and reading about natural medicine/ ayurveda. I recommend skilled ST to address cognitive and language deficits for better communication with family and friends, to increase independence and improve quality of life.    Speech Therapy Frequency 2x / week    Duration --   8 weeks or 17 visits   Treatment/Interventions SLP instruction and feedback;Functional tasks;Cognitive reorganization;Compensatory strategies;Internal/external aids;Multimodal communcation approach;Patient/family education;Environmental controls;Language facilitation;Compensatory techniques    Potential to Achieve Goals Good    Consulted and Agree with Plan of Care Patient           Patient will benefit from skilled therapeutic intervention in order to improve the following deficits and impairments:   Cognitive communication deficit  Aphasia    Problem List Patient Active Problem List   Diagnosis Date Noted  . Abnormality of gait 02/03/2020  . Benign essential HTN   . CKD (chronic kidney disease), stage II   . Hemiparesis affecting left side as late effect of stroke (HCC)   . Labile blood glucose   . Labile blood pressure   . Elevated BUN   . Hyponatremia   . Diabetic peripheral neuropathy (HCC)   . 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 Dyslipidemia   . Class 1 obesity due to  excess calories with serious comorbidity and body mass index (BMI) of 30.0 to 30.9 in adult   . Left hemiparesis (HCC)   . Controlled type 2 diabetes mellitus with hyperglycemia (HCC)   . Morbid obesity (HCC)   . Leukocytosis   . History of TIA  (transient ischemic attack)   . Acute CVA (cerebrovascular accident) (HCC) 01/06/2020  . Acute ischemic stroke (HCC) 01/05/2020  . Hypertensive emergency 01/26/2017  . TIA (transient ischemic attack) 01/26/2017  . Hypothyroidism 01/26/2017  . Aphasia   . Essential hypertension   . Diabetes mellitus without complication [facility])    Jill Baton, Jill Rose, Jill Rose Speech-Language Pathologist  Jill Rose 03/17/2020, 7:58 AM  [facility] [address] [place], [place], [address] Phone: [phone]   Fax:  [phone]  Name: Jill Rose MRN: [id number] Date of Birth: Apr 27, 1941

## 2020-03-19 ENCOUNTER — Ambulatory Visit: Payer: Medicare HMO

## 2020-03-19 ENCOUNTER — Ambulatory Visit: Payer: Medicare HMO | Admitting: Occupational Therapy

## 2020-03-19 ENCOUNTER — Encounter: Payer: Self-pay | Admitting: Physical Therapy

## 2020-03-19 ENCOUNTER — Encounter: Payer: Self-pay | Admitting: Occupational Therapy

## 2020-03-19 ENCOUNTER — Other Ambulatory Visit: Payer: Self-pay

## 2020-03-19 ENCOUNTER — Ambulatory Visit: Payer: Medicare HMO | Attending: Physician Assistant | Admitting: Physical Therapy

## 2020-03-19 DIAGNOSIS — M6281 Muscle weakness (generalized): Secondary | ICD-10-CM | POA: Diagnosis present

## 2020-03-19 DIAGNOSIS — R278 Other lack of coordination: Secondary | ICD-10-CM | POA: Diagnosis present

## 2020-03-19 DIAGNOSIS — R29818 Other symptoms and signs involving the nervous system: Secondary | ICD-10-CM | POA: Insufficient documentation

## 2020-03-19 DIAGNOSIS — R41841 Cognitive communication deficit: Secondary | ICD-10-CM | POA: Insufficient documentation

## 2020-03-19 DIAGNOSIS — I69354 Hemiplegia and hemiparesis following cerebral infarction affecting left non-dominant side: Secondary | ICD-10-CM

## 2020-03-19 DIAGNOSIS — R2681 Unsteadiness on feet: Secondary | ICD-10-CM

## 2020-03-19 DIAGNOSIS — R4701 Aphasia: Secondary | ICD-10-CM | POA: Diagnosis present

## 2020-03-19 NOTE — Patient Instructions (Signed)
  Coordination Activities  Perform the following activities for 20 minutes 2 times per day with left hand(s).   Rotate ball in fingertips (clockwise and counter-clockwise).  Toss ball between hands.  Toss ball in air and catch with the same hand.  Flip cards 1 at a time as fast as you can.  Deal cards with your thumb (Hold deck in hand and push card off top with thumb).  Pick up coins, buttons, marbles, dried beans/pasta of different sizes and place in container.  Pick up coins and place in container or coin bank.  Pick up coins and stack.

## 2020-03-19 NOTE — Therapy (Signed)
[facility] [address] [place], [place], [address] Phone: [phone]   Fax:  [phone]  Occupational Therapy Treatment  Patient Details  Name: Jill Rose MRN: [id number] Date of Birth: 27-Dec-1940 No data recorded  Encounter Date: 03/19/2020   OT End of Session - 03/19/20 1403    Visit Number 2    Number of Visits 17    Date for OT Re-Evaluation 05/16/20    Authorization Type [employer] MCR    Authorization - Visit Number --    Authorization - Number of Visits --    Progress Note Due on Visit 10    OT Start Time 1358    OT Stop Time 1443    OT Time Calculation (min) 45 min    Activity Tolerance Patient tolerated treatment well    Behavior During Therapy [facility] for tasks assessed/performed           Past Medical History:  Diagnosis Date  . Diabetes mellitus without complication (HCC)   . Essential hypertension   . Hypothyroidism   . TIA (transient ischemic attack) 2018    History reviewed. No pertinent surgical history.  There were no vitals filed for this visit.   Subjective Assessment - 03/19/20 1400    Subjective  Patient denies any pain and said "doing ok".    Pertinent History Rt MCA CVA 01/05/20. PMH: HTN, HLD, T2DM, Diabetic neuropathy    Limitations no driving, fall risk, HOH    Patient Stated Goals return to cooking    Currently in Pain? No/denies                    Treatment:  Patient issued HEP for coordination activities. See Patient instructions. Patient completed resistance clothespins with sustained pinch with LUE. Patient completed yellow and red resistance and placed on antenna. Patient required seated rest break after approximately 3 minutes of standing with contact guard assistance. Patient sit<>stand with min A and verbal cues for safety and hand placement. Patient stood with contact guard assistance for removing resistance clothespins with LUE. Completed grooved pegboard with  LUE with increased time and no additional assistance required this day. Patient demonstrated ability to translate and rotate pegs with min difficulty. Cues for sitting up straight and cues for attending to rounded shoulders and forward head posture while seated in wheelchair. Patient and CNA report patient only uses wheelchair for community distances and utilizes walker for home mobility. Patient reports she is cutting vegetables while her husband is present. Patient educated on safety and concerns with awareness of her LUE while cutting and something to be aware of. Patient verbalized understanding. Patient completed large peg board with following pattern with LUE for increased fine motor grasp and strength.             OT Education - 03/19/20 1441    Education Details Education provided regarding HEP for coordination. Education regarding safety with meal prep.    Person(s) Educated Patient    Methods Explanation    Comprehension Verbalized understanding;Returned demonstration            OT Short Term Goals - 03/19/20 1447      OT SHORT TERM GOAL #1   Title Independent with coordination HEP Lt hand    Time 4    Period Weeks    Status On-going   Issued coordination HEP 03/19/20     OT SHORT TERM GOAL #2   Title Pt to perform UE dressing with  min assist or less and LE dressing with mod assist or less    Time 4    Period Weeks    Status On-going   Pt reports doing approximately 50% of UB dressing 03/19/20     OT SHORT TERM GOAL #3   Title Pt to perform simple snack prep/cold meal prep standing level with DME and supervision prn    Time 4    Period Weeks    Status On-going   Pt reports assisting with cutting vegetables with husband 03/19/20 - seated.     OT SHORT TERM GOAL #4   Title Pt to perform sustained high level reaching LUE for endurance    Time 4    Period Weeks    Status New      OT SHORT TERM GOAL #5   Title Pt to return to putting up/styling hair mod I level    Time  4    Period Weeks    Status On-going   Pt reports needing assistance for clasps and barettes            OT Long Term Goals - 03/16/20 1518      OT LONG TERM GOAL #1   Title Independent with strengthening HEP for LUE    Time 8    Period Weeks    Status New      OT LONG TERM GOAL #2   Title Improve coordination Lt hand as evidenced by performing 9 hole peg test in 41 sec or less    Baseline 48.59 sec    Time 8    Period Weeks    Status New      OT LONG TERM GOAL #3   Title Pt to be mod I level with all BADLS    Time 8    Period Weeks    Status New      OT LONG TERM GOAL #4   Title Pt to cook simple meal with DME prn and distant supervision    Time 8    Period Weeks    Status New      OT LONG TERM GOAL #5   Title Pt to perform light cleaning tasks (washing dishes, folding laundry, etc) mod I level    Time 8    Period Weeks    Status New                 Plan - 03/19/20 1405    Clinical Impression Statement Pt motivated to work towards goals. Limited by cognitive deficits with understanding of instructions.    OT Occupational Profile and History Detailed Assessment- Review of Records and additional review of physical, cognitive, psychosocial history related to current functional performance    Occupational performance deficits (Please refer to evaluation for details): ADL's;IADL's    Body Structure / Function / Physical Skills ADL;ROM;Balance;Body mechanics;Mobility;Flexibility;Strength;Coordination;FMC;UE functional use;Decreased knowledge of use of DME    Rehab Potential Good    Clinical Decision Making Several treatment options, min-mod task modification necessary    Comorbidities Affecting Occupational Performance: May have comorbidities impacting occupational performance    Modification or Assistance to Complete Evaluation  Min-Moderate modification of tasks or assist with assess necessary to complete eval    OT Frequency 2x / week    OT Duration 8 weeks    Plus eval   OT Treatment/Interventions Self-care/ADL training;Therapeutic exercise;Functional Mobility Training;Neuromuscular education;Manual Therapy;Therapeutic activities;Coping strategies training;DME and/or AE instruction;Cognitive remediation/compensation;Moist Heat;Passive range of motion;Patient/family education    Plan Continue  to review coordination HEP and exercises. ADLs/IADLs. bimanual tasks/cutting    Consulted and Agree with Plan of Care Patient;Family member/caregiver    Family Member Consulted caregiver CNA           Patient will benefit from skilled therapeutic intervention in order to improve the following deficits and impairments:   Body Structure / Function / Physical Skills: ADL, ROM, Balance, Body mechanics, Mobility, Flexibility, Strength, Coordination, FMC, UE functional use, Decreased knowledge of use of DME       Visit Diagnosis: Hemiplegia and hemiparesis following cerebral infarction affecting left non-dominant side (HCC)  Muscle weakness (generalized)  Unsteadiness on feet  Other lack of coordination  Other symptoms and signs involving the nervous system    Problem List Patient Active Problem List   Diagnosis Date Noted  . Abnormality of gait 02/03/2020  . Benign essential HTN   . CKD (chronic kidney disease), stage II   . Hemiparesis affecting left side as late effect of stroke (HCC)   . Labile blood glucose   . Labile blood pressure   . Elevated BUN   . Hyponatremia   . Diabetic peripheral neuropathy (HCC)   . 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 Dyslipidemia   . Class 1 obesity due to excess calories with serious comorbidity and body mass index (BMI) of 30.0 to 30.9 in adult   . Left hemiparesis (HCC)   . Controlled type 2 diabetes mellitus with hyperglycemia (HCC)   . Morbid obesity (HCC)   . Leukocytosis   . History of TIA (transient ischemic attack)   . Acute CVA (cerebrovascular accident) (HCC) 01/06/2020  .  Acute ischemic stroke (HCC) 01/05/2020  . Hypertensive emergency 01/26/2017  . TIA (transient ischemic attack) 01/26/2017  . Hypothyroidism 01/26/2017  . Aphasia   . Essential hypertension   . Diabetes mellitus without complication [facility])     Junious Dresser, MOT, OTR/L 03/19/2020, 2:50 PM  [facility] [address] [place], [place], [address] Phone: [phone]   Fax:  [phone]  Name: KAROLINE FLEER MRN: [id number] Date of Birth: 1940-08-22

## 2020-03-19 NOTE — Therapy (Addendum)
[facility] [address] [place], [place], [address] Phone: [phone]   Fax:  [phone]  Physical Therapy Treatment  Patient Details  Name: Jill Rose MRN: [id number] Date of Birth: [birth year] Referring Provider (PT): Fortino Sic, [place] (being followed by Dr. Posey Pronto)   Encounter Date: 03/19/2020   PT End of Session - 03/19/20 1534    Visit Number 5    Number of Visits 17    Date for PT Re-Evaluation 05/13/20   written for 60 day POC   Authorization Type [employer]- $35 co pay per day regardless of discipline    PT Start Time 1532    PT Stop Time 1613    PT Time Calculation (min) 41 min    Equipment Utilized During Treatment Gait belt    Activity Tolerance Patient tolerated treatment well;Patient limited by fatigue    Behavior During Therapy [facility] for tasks assessed/performed           Past Medical History:  Diagnosis Date  . Diabetes mellitus without complication ([place])   . Essential hypertension   . Hypothyroidism   . TIA (transient ischemic attack) 2018    History reviewed. No pertinent surgical history.  There were no vitals filed for this visit.   Subjective Assessment - 03/19/20 1533    Subjective No falls or pain to report. Pt and CNA report she has been walking more at home. Report the HEP is going well.    Patient is accompained by: Family member   pt's CNA   Pertinent History diabetes mellitus, HTN, hypothyroidism, TIA (2018)    Patient Stated Goals wants to be walking more.    Currently in Pain? No/denies              [facility] PT Assessment - 03/19/20 1537      Standardized Balance Assessment   Standardized Balance Assessment Berg Balance Test;Timed Up and Go Test      Berg Balance Test   Sit to Stand Able to stand using hands after several tries    Standing Unsupported Able to stand 2 minutes with supervision    Sitting with Back Unsupported but Feet Supported on Floor or  Stool Able to sit safely and securely 2 minutes    Stand to Sit Uses backs of legs against chair to control descent    Transfers Needs one person to assist    Standing Unsupported with Eyes Closed Able to stand 10 seconds with supervision    Standing Unsupported with Feet Together Able to place feet together independently but unable to hold for 30 seconds    From Standing, Reach Forward with Outstretched Arm Reaches forward but needs supervision    From Standing Position, Pick up Object from Floor Able to pick up shoe, needs supervision    From Standing Position, Turn to Look Behind Over each Shoulder Needs supervision when turning   right>left side   Turn 360 Degrees Needs assistance while turning    Standing Unsupported, Alternately Place Feet on Step/Stool Needs assistance to keep from falling or unable to try   needed bil UE support, able to complete 8   Standing Unsupported, One Foot in Front Needs help to step but can hold 15 seconds    Standing on One Leg Unable to try or needs assist to prevent fall    Total Score 23    Berg comment: <36 = high risk for falls (close to 100%)  Timed Up and Go Test   TUG Normal TUG    Normal TUG (seconds) 163.53   2 minutes, 43.53 sec's. min guard with max cues               [facility] Adult PT Treatment/Exercise - 03/19/20 1545      Transfers   Transfers Sit to Stand;Stand to Sit    Sit to Stand 4: Min guard;With upper extremity assist;From chair/3-in-1;From bed    Sit to Stand Details Verbal cues for sequencing;Verbal cues for technique;Verbal cues for safe use of DME/AE    Sit to Stand Details (indicate cue type and reason) cues to scoot to edge and for hand placement    Stand to Sit 4: Min guard;With upper extremity assist;To bed;To chair/3-in-1    Stand to Sit Details (indicate cue type and reason) Verbal cues for sequencing;Verbal cues for precautions/safety;Verbal cues for safe use of DME/AE;Verbal cues for technique    Stand to Sit  Details cues to back all the way to surface, turn all the way and reach back      Ambulation/Gait   Ambulation/Gait Yes    Ambulation/Gait Assistance 4: Min assist;4: Min guard    Ambulation/Gait Assistance Details with CNA bringing w/c behind pt for safety. cues/faciliation for continous walker motion, incr weight shifting with faciliation at pt's lateral left hip for increase step length on left LE.     Ambulation Distance (Feet) 62 Feet   x1   Assistive device Rolling walker    Gait Pattern Step-to pattern;Decreased step length - right;Decreased step length - left;Decreased dorsiflexion - left;Decreased dorsiflexion - right;Right foot flat;Left foot flat;Poor foot clearance - left;Poor foot clearance - right;Shuffle;Step-through pattern;Decreased stance time - right;Decreased stride length    Ambulation Surface Level;Indoor                 PT Short Term Goals - 03/19/20 1534      PT SHORT TERM GOAL #1   Title Pt will be independent with initial HEP in order to build upon functional gains made in therapy. ALL STGS DUE 03/14/20    Baseline 03/19/20: met with current program with CNA assistance.    Time --    Period --    Status Achieved    Target Date --      PT SHORT TERM GOAL #2   Title Pt will undergo further assessment of TUG with STG and LTG to be written as appropriate.    Baseline 03/19/20: 163.53 sec's (2 minutes, 43.53 sec's) scored today as baseline, LTG to be set by primary PT    Time --    Period --    Status Achieved      PT SHORT TERM GOAL #3   Title Pt will decr 5x sit <> stand time with single UE support to 45 seconds or less in order to demo improved BLE strength.    Baseline 03/09/20: 53.79 seconds as baseline, not reassesed on 03/19/20 as it was just done session prior to this one    Status Not Met      PT SHORT TERM GOAL #4   Title Pt will undergo further assessment of BERG with LTG to be written as appropriate.    Baseline 03/19/20: 23/56 scored today as  baseline, primary PT to set LTGs    Time --    Period --    Status Achieved      PT SHORT TERM GOAL #5   Title Pt will ambulate at  least 52' with RW with supervision in order to improve household mobility.    Baseline 03/19/20: 62 feet with RW with min guard assist. improved from 20 feet, just not to goal    Time --    Period --    Status Not Met      PT SHORT TERM GOAL #6   Title Pt will perform stand pivot transfers and sit <> stands with RW with supervision in order to improve functional mobility.    Baseline 03/19/20: met in session today from RW to mat table    Time --    Period --    Status Achieved               PT Long Term Goals - 03/23/20 1128      PT LONG TERM GOAL #1   Title Pt will be independent with final HEP in order to build upon functional gains made in therapy. ALL LTGS DUE 04/11/20    Time 8    Period Weeks    Status New      PT LONG TERM GOAL #2   Title Pt will decrease TUG time to 2 minutes or less with RW in order to demo decr fall risk.    Baseline 2 minutes, 43.53 sec's    Time 8    Period Weeks    Status Revised      PT LONG TERM GOAL #3   Title Pt will improve BERG score to at least a 30/56 in order to demo decr fall risk.    Baseline 23/56    Time 8    Period Weeks    Status Revised      PT LONG TERM GOAL #4   Title Pt will decr 5x sit <> stand time to 35 seconds or less from mat table with UE support in order to demo improved functional BLE strength.    Baseline 53.79 seconds from mat table    Time 8    Period Weeks    Status Revised      PT LONG TERM GOAL #5   Title Pt will ambulate at least 230' with RW vs. LRAD with supervision in order to demo improved gait efficiency.    Time 8    Period Weeks    Status New      PT LONG TERM GOAL #6   Title Pt will perform all bed mobility with supervision in order to decr caregiver burden.    Time 8    Period Weeks    Status New               03/19/20 1534  Plan  Clinical  Impression Statement Today's skilled session focused on progress toward STGs with 4 goals met and 2 goals not met. Baseline values set for [facility] test and Timed up and go today with the primary PT to update LTGs. The pt is progessing toward goals and should benefit from continued PT to progress toward unmet goals.  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  Examination-Activity Limitations Bed Mobility;Bend;Locomotion Level;Stairs;Stand;Transfers;Squat  Examination-Participation Restrictions Community Activity  Pt will benefit from skilled therapeutic intervention in order to improve on the following deficits Abnormal gait;Decreased balance;Decreased activity tolerance;Decreased coordination;Decreased endurance;Decreased knowledge of use of DME;Decreased range of motion;Difficulty walking;Decreased strength;Postural dysfunction  Stability/Clinical Decision Making Stable/Uncomplicated  Rehab Potential Good  PT Frequency 2x /  week  PT Duration 8 weeks  PT Treatment/Interventions ADLs/Self Care Home Management;Gait training;DME Instruction;Stair training;Functional mobility training;Therapeutic activities;Neuromuscular re-education;Balance training;Therapeutic exercise;Patient/family education;Orthotic Fit/Training;Energy conservation;Passive range of motion  PT Next Visit Plan continue to work on LE strengthening, gait and transfer training with RW, assess gait speed when able  Consulted and Agree with Plan of Care Patient;Family member/caregiver  Family Member Consulted CNA Charlotted       Patient will benefit from skilled therapeutic intervention in order to improve the following deficits and impairments:  Abnormal gait, Decreased balance, Decreased activity tolerance, Decreased coordination, Decreased endurance, Decreased knowledge of use  of DME, Decreased range of motion, Difficulty walking, Decreased strength, Postural dysfunction  Visit Diagnosis: Hemiplegia and hemiparesis following cerebral infarction affecting left non-dominant side (HCC)  Muscle weakness (generalized)  Unsteadiness on feet     Problem List Patient Active Problem List   Diagnosis Date Noted  . Abnormality of gait 02/03/2020  . Benign essential HTN   . CKD (chronic kidney disease), stage II   . Hemiparesis affecting left side as late effect of stroke ([place])   . Labile blood glucose   . Labile blood pressure   . Elevated BUN   . Hyponatremia   . Diabetic peripheral neuropathy ([place])   . 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 Dyslipidemia   . Class 1 obesity due to excess calories with serious comorbidity and body mass index (BMI) of 30.0 to 30.9 in adult   . Left hemiparesis ([place])   . Controlled type 2 diabetes mellitus with hyperglycemia ([place])   . Morbid obesity ([place])   . Leukocytosis   . History of TIA (transient ischemic attack)   . Acute CVA (cerebrovascular accident) ([place]) 01/06/2020  . Acute ischemic stroke ([place]) 01/05/2020  . Hypertensive emergency 01/26/2017  . TIA (transient ischemic attack) 01/26/2017  . Hypothyroidism 01/26/2017  . Aphasia   . Essential hypertension   . Diabetes mellitus without complication ([place])    Willow Ora, PTA, [facility] [address], [place], [place] [address] [phone] 03/20/20, 3:12 PM   Name: Jill Rose MRN: [id number] Date of Birth: 09-Jul-1941    Janann August, PT, DPT 03/23/20 11:31 AM

## 2020-03-20 NOTE — Therapy (Signed)
[facility] [address] [facility] [address] [place], [place], [address] Phone: [phone]   Fax:  [phone]  Speech Language Pathology Treatment  Patient Details  Name: Jill Rose MRN: [id number] Date of Birth: 1940-10-18 Referring Provider (SLP): [name], MD   Encounter Date: 03/19/2020   End of Session - 03/20/20 0003    Visit Number 2    Number of Visits 17    Date for SLP Re-Evaluation 05/15/20    SLP Start Time 1450    SLP Stop Time  1532    SLP Time Calculation (min) 42 min    Activity Tolerance Patient tolerated treatment well           Past Medical History:  Diagnosis Date  . Diabetes mellitus without complication (HCC)   . Essential hypertension   . Hypothyroidism   . TIA (transient ischemic attack) 2018    History reviewed. No pertinent surgical history.  There were no vitals filed for this visit.   Subjective Assessment - 03/20/20 0000    Subjective Pt req'd cues for taking off her break on lt.    Currently in Pain? No/denies                 ADULT SLP TREATMENT - 03/20/20 0000      General Information   Behavior/Cognition Alert;Cooperative;Confused;Requires cueing      Treatment Provided   Treatment provided Cognitive-Linquistic      Cognitive-Linquistic Treatment   Treatment focused on Cognition    Skilled Treatment Pt appears with lt inattention with calendar tasks - pt req'd consistent cues to look to lt. SLP req'd to provide pt with mod-max cues consistently with simple calendar tasks using her therapy calendar. She named average 6 items in specific categories. Pt's CNA did not join session today.       Assessment / Recommendations / Plan   Plan Continue with current plan of care      Progression Toward Goals   Progression toward goals Progressing toward goals              SLP Short Term Goals - 03/20/20 0005      SLP SHORT TERM GOAL #1   Title pt will *attempt to  access* her memory system for simple organization of discipline-specific items, or for temporal orientation x5 sessions    Time 4    Period Weeks    Status On-going      SLP SHORT TERM GOAL #2   Title pt will demo knowledge of items in her memory system/notebook by correctly accessing them in 2 sessions    Time 4    Period Weeks    Status On-going      SLP SHORT TERM GOAL #3   Title pt will demonstrate error awareness in therapy tasks 75% with min cues for re-check in 3 sessions    Time 4    Period Weeks    Status On-going      SLP SHORT TERM GOAL #4   Title pt will name 8 items for simple categories x 3 sessions    Time 4    Period Weeks    Status On-going            SLP Long Term Goals - 03/20/20 0005      SLP LONG TERM GOAL #1   Title pt will demo correct usage of a memory compensation system for appointment and/or logging blood pressure and blood sugar, daily schedules in  3 sessions    Time 8    Period Weeks    Status On-going      SLP LONG TERM GOAL #2   Title Pt will maintain selective attention in functional task for 8 minutes in mod noisy environment with rare min cues x 2 sessions.    Time 8    Period Weeks    Status On-going      SLP LONG TERM GOAL #3   Title pt will demo anticipatory awareness regarding deficit areas, independently, in 3 sessions    Time 8    Period Weeks    Status On-going      SLP LONG TERM GOAL #4   Title pt will use compensations for anomia in 8 minutes simple conversation x3 visits    Time 8    Period Weeks    Status On-going            Plan - 03/20/20 0003    Clinical Impression Statement [name] presents with overall moderate cognitive communication deficits. Difficulties with wordfinding x1 seen today in conversation, and moreso during divergent naming tasks today. Agree pt will benefit from audiological eval. Suspect attention, primarily, and slow processing contributes to this difficulty. Prior to her CVA pt enjoyed  cooking and reading about natural medicine/ ayurveda. I recommend skilled ST to address cognitive and language deficits for better communication with family and friends, to increase independence and improve quality of life.    Speech Therapy Frequency 2x / week    Duration --   8 weeks or 17 visits   Treatment/Interventions SLP instruction and feedback;Functional tasks;Cognitive reorganization;Compensatory strategies;Internal/external aids;Multimodal communcation approach;Patient/family education;Environmental controls;Language facilitation;Compensatory techniques    Potential to Achieve Goals Good    Consulted and Agree with Plan of Care Patient           Patient will benefit from skilled therapeutic intervention in order to improve the following deficits and impairments:   Cognitive communication deficit  Aphasia    Problem List Patient Active Problem List   Diagnosis Date Noted  . Abnormality of gait 02/03/2020  . Benign essential HTN   . CKD (chronic kidney disease), stage II   . Hemiparesis affecting left side as late effect of stroke (HCC)   . Labile blood glucose   . Labile blood pressure   . Elevated BUN   . Hyponatremia   . Diabetic peripheral neuropathy (HCC)   . 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 Dyslipidemia   . Class 1 obesity due to excess calories with serious comorbidity and body mass index (BMI) of 30.0 to 30.9 in adult   . Left hemiparesis (HCC)   . Controlled type 2 diabetes mellitus with hyperglycemia (HCC)   . Morbid obesity (HCC)   . Leukocytosis   . History of TIA (transient ischemic attack)   . Acute CVA (cerebrovascular accident) (HCC) 01/06/2020  . Acute ischemic stroke (HCC) 01/05/2020  . Hypertensive emergency 01/26/2017  . TIA (transient ischemic attack) 01/26/2017  . Hypothyroidism 01/26/2017  . Aphasia   . Essential hypertension   . Diabetes mellitus without complication (HCC)     Amber Williard ,MS,  CCC-SLP  03/20/2020, 12:06 AM  [facility] [address] [place], [place], [address] Phone: [phone]   Fax:  [phone]   Name: Jill Rose MRN: [id number] Date of Birth: 1941/04/22

## 2020-03-24 ENCOUNTER — Ambulatory Visit: Payer: Medicare HMO

## 2020-03-24 ENCOUNTER — Ambulatory Visit: Payer: Medicare HMO | Admitting: Occupational Therapy

## 2020-03-24 ENCOUNTER — Ambulatory Visit: Payer: Medicare HMO | Admitting: Physical Therapy

## 2020-03-26 ENCOUNTER — Ambulatory Visit: Payer: Medicare HMO

## 2020-03-26 ENCOUNTER — Ambulatory Visit: Payer: Medicare HMO | Admitting: Physical Therapy

## 2020-03-26 ENCOUNTER — Ambulatory Visit: Payer: Medicare HMO | Admitting: Occupational Therapy

## 2020-03-26 ENCOUNTER — Other Ambulatory Visit: Payer: Self-pay

## 2020-03-26 DIAGNOSIS — R41841 Cognitive communication deficit: Secondary | ICD-10-CM

## 2020-03-26 DIAGNOSIS — M6281 Muscle weakness (generalized): Secondary | ICD-10-CM

## 2020-03-26 DIAGNOSIS — I69354 Hemiplegia and hemiparesis following cerebral infarction affecting left non-dominant side: Secondary | ICD-10-CM | POA: Diagnosis not present

## 2020-03-26 DIAGNOSIS — R278 Other lack of coordination: Secondary | ICD-10-CM

## 2020-03-26 DIAGNOSIS — R2681 Unsteadiness on feet: Secondary | ICD-10-CM

## 2020-03-26 DIAGNOSIS — R4701 Aphasia: Secondary | ICD-10-CM

## 2020-03-26 NOTE — Therapy (Signed)
[facility] [address] [place], [place], [address] Phone: [phone]   Fax:  [phone]  Physical Therapy Treatment  Patient Details  Name: Jill Rose MRN: [id number] Date of Birth: 1941-05-20 Referring Provider (PT): Fortino Sic, [place] (being followed by Dr. Posey Pronto)   Encounter Date: 03/26/2020   PT End of Session - 03/26/20 1626    Visit Number 6    Number of Visits 17    Date for PT Re-Evaluation 05/13/20   written for 60 day POC   Authorization Type [employer] Medicare- $35 co pay per day regardless of discipline    PT Start Time 8003    PT Stop Time 1528    PT Time Calculation (min) 41 min    Equipment Utilized During Treatment Gait belt    Activity Tolerance Patient tolerated treatment well;Patient limited by fatigue    Behavior During Therapy [facility] for tasks assessed/performed           Past Medical History:  Diagnosis Date   Diabetes mellitus without complication ([place])    Essential hypertension    Hypothyroidism    TIA (transient ischemic attack) 2018    No past surgical history on file.  There were no vitals filed for this visit.   Subjective Assessment - 03/26/20 1451    Subjective No falls. Had to miss earlier this week because of an earache.    Patient is accompained by: Family member   pt's CNA   Pertinent History diabetes mellitus, HTN, hypothyroidism, TIA (2018)    Patient Stated Goals wants to be walking more.    Currently in Pain? No/denies                             [facility] Adult PT Treatment/Exercise - 03/26/20 1511      Transfers   Transfers Sit to Stand;Stand to Sit    Sit to Stand 4: Min guard;With upper extremity assist;From chair/3-in-1;From bed    Sit to Stand Details Verbal cues for sequencing;Verbal cues for technique;Verbal cues for safe use of DME/AE    Stand to Sit 4: Min guard;With upper extremity assist;To bed;To chair/3-in-1    Stand to Sit  Details (indicate cue type and reason) Verbal cues for sequencing;Verbal cues for precautions/safety;Verbal cues for safe use of DME/AE;Verbal cues for technique    Stand to Sit Details cues to reach back prior to sitting    Stand Pivot Transfers 4: Min guard    Stand Pivot Transfer Details (indicate cue type and reason) cues for sequencing, pt with tendency to perform with more shuffled steps     Comments x5 reps sit <> stands from mat table, needs consistent cueing for proper UE placement for safety      Ambulation/Gait   Ambulation/Gait Yes    Ambulation/Gait Assistance 4: Min assist;4: Min guard    Ambulation/Gait Assistance Details w/c follow for safety (pt did not need), cues for sequencing and for longer step length with RLE, needs manual facilitation at times to push RW     Ambulation Distance (Feet) 115 Feet   x1   Assistive device Rolling walker    Gait Pattern Step-to pattern;Decreased step length - right;Decreased step length - left;Decreased dorsiflexion - left;Decreased dorsiflexion - right;Right foot flat;Left foot flat;Poor foot clearance - left;Poor foot clearance - right;Shuffle;Step-through pattern;Decreased stance time - right;Decreased stride length    Ambulation Surface Level;Indoor    Gait Comments BP  after gait: 139/64, HR: 98 bpm      Neuro Re-ed    Neuro Re-ed Details  standing at RW with BUE support: 2 x 10 reps stepping RLE forwards to yellow floor disc for improved step length with RLE and incr weight shift to L. x10 reps RLE taps to 4" step for incr SLS on RLE                     PT Short Term Goals - 03/19/20 1534      PT SHORT TERM GOAL #1   Title Pt will be independent with initial HEP in order to build upon functional gains made in therapy. ALL STGS DUE 03/14/20    Baseline 03/19/20: met with current program with CNA assistance.    Time --    Period --    Status Achieved    Target Date --      PT SHORT TERM GOAL #2   Title Pt will undergo  further assessment of TUG with STG and LTG to be written as appropriate.    Baseline 03/19/20: 163.53 sec's (2 minutes, 43.53 sec's) scored today as baseline, LTG to be set by primary PT    Time --    Period --    Status Achieved      PT SHORT TERM GOAL #3   Title Pt will decr 5x sit <> stand time with single UE support to 45 seconds or less in order to demo improved BLE strength.    Baseline 03/09/20: 53.79 seconds as baseline, not reassesed on 03/19/20 as it was just done session prior to this one    Status Not Met      PT SHORT TERM GOAL #4   Title Pt will undergo further assessment of BERG with LTG to be written as appropriate.    Baseline 03/19/20: 23/56 scored today as baseline, primary PT to set LTGs    Time --    Period --    Status Achieved      PT SHORT TERM GOAL #5   Title Pt will ambulate at least 3' with RW with supervision in order to improve household mobility.    Baseline 03/19/20: 62 feet with RW with min guard assist. improved from 20 feet, just not to goal    Time --    Period --    Status Not Met      PT SHORT TERM GOAL #6   Title Pt will perform stand pivot transfers and sit <> stands with RW with supervision in order to improve functional mobility.    Baseline 03/19/20: met in session today from RW to mat table    Time --    Period --    Status Achieved             PT Long Term Goals - 03/23/20 1128      PT LONG TERM GOAL #1   Title Pt will be independent with final HEP in order to build upon functional gains made in therapy. ALL LTGS DUE 04/11/20    Time 8    Period Weeks    Status New      PT LONG TERM GOAL #2   Title Pt will decrease TUG time to 2 minutes or less with RW in order to demo decr fall risk.    Baseline 2 minutes, 43.53 sec's    Time 8    Period Weeks    Status Revised  PT LONG TERM GOAL #3   Title Pt will improve BERG score to at least a 30/56 in order to demo decr fall risk.    Baseline 23/56    Time 8    Period Weeks    Status  Revised      PT LONG TERM GOAL #4   Title Pt will decr 5x sit <> stand time to 35 seconds or less from mat table with UE support in order to demo improved functional BLE strength.    Baseline 53.79 seconds from mat table    Time 8    Period Weeks    Status Revised      PT LONG TERM GOAL #5   Title Pt will ambulate at least 230' with RW vs. LRAD with supervision in order to demo improved gait efficiency.    Time 8    Period Weeks    Status New      PT LONG TERM GOAL #6   Title Pt will perform all bed mobility with supervision in order to decr caregiver burden.    Time 8    Period Weeks    Status New                 Plan - 03/26/20 1627    Clinical Impression Statement Pt improved gait distance today with RW - able to ambulate 115' continuously without a seated rest break with BP WNL afterwards. Pt continues to need cues for proper UE placement for safety with sit <> stands with RW. Will continue to progress towards LTGs.    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    Examination-Activity Limitations Bed Mobility;Bend;Locomotion Level;Stairs;Stand;Transfers;Squat    Examination-Participation Restrictions Community Activity    Stability/Clinical Decision Making Stable/Uncomplicated    Rehab Potential Good    PT Frequency 2x / week    PT Duration 8 weeks    PT Treatment/Interventions ADLs/Self Care Home Management;Gait training;DME Instruction;Stair training;Functional mobility training;Therapeutic activities;Neuromuscular re-education;Balance training;Therapeutic exercise;Patient/family education;Orthotic Fit/Training;Energy conservation;Passive range of motion    PT Next Visit Plan try nustep/scifit, continue to work on LE strengthening, gait and transfer training with RW, assess gait speed when able    Consulted and Agree  with Plan of Care Patient;Family member/caregiver    Family Member Consulted CNA Charlotted           Patient will benefit from skilled therapeutic intervention in order to improve the following deficits and impairments:  Abnormal gait, Decreased balance, Decreased activity tolerance, Decreased coordination, Decreased endurance, Decreased knowledge of use of DME, Decreased range of motion, Difficulty walking, Decreased strength, Postural dysfunction  Visit Diagnosis: Hemiplegia and hemiparesis following cerebral infarction affecting left non-dominant side (HCC)  Muscle weakness (generalized)  Unsteadiness on feet  Other lack of coordination     Problem List Patient Active Problem List   Diagnosis Date Noted   Abnormality of gait 02/03/2020   Benign essential HTN    CKD (chronic kidney disease), stage II    Hemiparesis affecting left side as late effect of stroke ([place])    Labile blood glucose    Labile blood pressure    Elevated BUN    Hyponatremia    Diabetic peripheral neuropathy ([place])    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   Dyslipidemia    Class 1 obesity due to excess calories with serious comorbidity and body mass  index (BMI) of 30.0 to 30.9 in adult    Left hemiparesis (HCC)    Controlled type 2 diabetes mellitus with hyperglycemia ([place])    Morbid obesity (HCC)    Leukocytosis    History of TIA (transient ischemic attack)    Acute CVA (cerebrovascular accident) ([place]) 01/06/2020   Acute ischemic stroke ([place]) 01/05/2020   Hypertensive emergency 01/26/2017   TIA (transient ischemic attack) 01/26/2017   Hypothyroidism 01/26/2017   Aphasia    Essential hypertension    Diabetes mellitus without complication ([place])     Arliss Journey, PT, DPT  03/26/2020, 4:28 PM  [facility] [address] [place], [place], [address] Phone: [phone]   Fax:   [phone]  Name: HENSLEE LOTTMAN MRN: [id number] Date of Birth: Sep 28, 1940

## 2020-03-26 NOTE — Patient Instructions (Signed)
  *   Please do these things with [facility] for15-20 minutes, twice each day  * It is important to do these things with Jill Rose so that she can do more of   the things she was doing before the stroke.

## 2020-03-26 NOTE — Therapy (Signed)
[facility] [address] [place], [place], [address] Phone: [phone]   Fax:  [phone]  Speech Language Pathology Treatment  Patient Details  Name: Jill Rose MRN: [id number] Date of Birth: 1941-05-03 Referring Provider (SLP): [name], MD   Encounter Date: 03/26/2020   End of Session - 03/26/20 1712    Visit Number 3    Number of Visits 17    Date for SLP Re-Evaluation 05/15/20    SLP Start Time 1532    SLP Stop Time  1615    SLP Time Calculation (min) 43 min    Activity Tolerance Patient tolerated treatment well           Past Medical History:  Diagnosis Date  . Diabetes mellitus without complication (HCC)   . Essential hypertension   . Hypothyroidism   . TIA (transient ischemic attack) 2018    History reviewed. No pertinent surgical history.  There were no vitals filed for this visit.   Subjective Assessment - 03/26/20 1536    Subjective Pt arrives alone today to ST, from PT.    Currently in Pain? No/denies                 ADULT SLP TREATMENT - 03/26/20 1537      General Information   Behavior/Cognition Alert;Cooperative;Confused;Requires cueing      Treatment Provided   Treatment provided Cognitive-Linquistic      Cognitive-Linquistic Treatment   Treatment focused on Cognition    Skilled Treatment Arrived alone. Pt did not recall specifics of last ST visit a week ago. SLP worked with pt to habitualize looking to left for reading comprehension with initial consistent mod-max cues faded to usual mod cues. SLP also targeted attention in auditory processing task of auditory functional temporal orientation word problems via [facility] math book. Pt with slightly decr'd processing time/attention with simple (15, 30, 45 minute) scenarios with mod cues, and significantly incr'd times necessary for any other scenarios (25, 35, 50 minutes) and mod-max cues.SLP educated pt's CNA about the  homework. Pt stated her husband "might not do this with me."      Assessment / Recommendations / Plan   Plan Continue with current plan of care      Progression Toward Goals   Progression toward goals Progressing toward goals            SLP Education - 03/26/20 1711    Education Details homework assigned - 20 minutes twice a day    Person(s) Educated Patient;Caregiver(s)    Methods Explanation    Comprehension Verbalized understanding            SLP Short Term Goals - 03/26/20 1713      SLP SHORT TERM GOAL #1   Title pt will *attempt to access* her memory system for simple organization of discipline-specific items, or for temporal orientation x5 sessions    Time 3    Period Weeks    Status On-going      SLP SHORT TERM GOAL #2   Title pt will demo knowledge of items in her memory system/notebook by correctly accessing them in 2 sessions    Time 3    Period Weeks    Status On-going      SLP SHORT TERM GOAL #3   Title pt will demonstrate error awareness in therapy tasks 75% with min cues for re-check in 3 sessions    Time 3    Period Weeks  Status On-going      SLP SHORT TERM GOAL #4   Title pt will name 8 items for simple categories x 3 sessions    Time 3    Period Weeks    Status On-going            SLP Long Term Goals - 03/26/20 1713      SLP LONG TERM GOAL #1   Title pt will demo correct usage of a memory compensation system for appointment and/or logging blood pressure and blood sugar, daily schedules in 3 sessions    Time 7    Period Weeks    Status On-going      SLP LONG TERM GOAL #2   Title Pt will maintain selective attention in functional task for 8 minutes in mod noisy environment with rare min cues x 2 sessions.    Time 7    Period Weeks    Status On-going      SLP LONG TERM GOAL #3   Title pt will demo anticipatory awareness regarding deficit areas, independently, in 3 sessions    Time 7    Period Weeks    Status On-going      SLP  LONG TERM GOAL #4   Title pt will use compensations for anomia in 8 minutes simple conversation x3 visits    Time 7    Period Weeks    Status On-going            Plan - 03/26/20 1712    Clinical Impression Statement Mrs. Glore presents with overall moderate cognitive communication deficits. Agree pt will benefit from audiological eval. Suspect attention, primarily, and slow processing contributes to this difficulty. Prior to her CVA pt enjoyed cooking and reading about natural medicine/ ayurveda. I recommend skilled ST to address cognitive and language deficits for better communication with family and friends, to increase independence and improve quality of life.    Speech Therapy Frequency 2x / week    Duration --   8 weeks or 17 visits   Treatment/Interventions SLP instruction and feedback;Functional tasks;Cognitive reorganization;Compensatory strategies;Internal/external aids;Multimodal communcation approach;Patient/family education;Environmental controls;Language facilitation;Compensatory techniques    Potential to Achieve Goals Good    Consulted and Agree with Plan of Care Patient           Patient will benefit from skilled therapeutic intervention in order to improve the following deficits and impairments:   Cognitive communication deficit  Aphasia    Problem List Patient Active Problem List   Diagnosis Date Noted  . Abnormality of gait 02/03/2020  . Benign essential HTN   . CKD (chronic kidney disease), stage II   . Hemiparesis affecting left side as late effect of stroke (HCC)   . Labile blood glucose   . Labile blood pressure   . Elevated BUN   . Hyponatremia   . Diabetic peripheral neuropathy (HCC)   . 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 Dyslipidemia   . Class 1 obesity due to excess calories with serious comorbidity and body mass index (BMI) of 30.0 to 30.9 in adult   . Left hemiparesis (HCC)   . Controlled type 2 diabetes mellitus with  hyperglycemia (HCC)   . Morbid obesity (HCC)   . Leukocytosis   . History of TIA (transient ischemic attack)   . Acute CVA (cerebrovascular accident) (HCC) 01/06/2020  . Acute ischemic stroke (HCC) 01/05/2020  . Hypertensive emergency 01/26/2017  . TIA (transient ischemic attack) 01/26/2017  . Hypothyroidism 01/26/2017  .  Aphasia   . Essential hypertension   . Diabetes mellitus without complication (HCC)     Natesha Hassey ,MS, CCC-SLP  03/26/2020, 5:14 PM  [facility] [address] [place], [place], [address] Phone: [phone]   Fax:  [phone]   Name: ANJELA CASSARA MRN: [id number] Date of Birth: [birth year]

## 2020-03-30 ENCOUNTER — Ambulatory Visit: Payer: Medicare HMO | Admitting: Speech Pathology

## 2020-03-30 ENCOUNTER — Ambulatory Visit: Payer: Medicare HMO | Admitting: Occupational Therapy

## 2020-03-30 ENCOUNTER — Ambulatory Visit: Payer: Medicare HMO | Admitting: Physical Therapy

## 2020-03-30 ENCOUNTER — Encounter: Payer: Self-pay | Admitting: Physical Therapy

## 2020-03-30 ENCOUNTER — Other Ambulatory Visit: Payer: Self-pay

## 2020-03-30 ENCOUNTER — Encounter: Payer: Self-pay | Admitting: Occupational Therapy

## 2020-03-30 DIAGNOSIS — I69354 Hemiplegia and hemiparesis following cerebral infarction affecting left non-dominant side: Secondary | ICD-10-CM

## 2020-03-30 DIAGNOSIS — R29818 Other symptoms and signs involving the nervous system: Secondary | ICD-10-CM

## 2020-03-30 DIAGNOSIS — M6281 Muscle weakness (generalized): Secondary | ICD-10-CM

## 2020-03-30 DIAGNOSIS — R2681 Unsteadiness on feet: Secondary | ICD-10-CM

## 2020-03-30 DIAGNOSIS — R278 Other lack of coordination: Secondary | ICD-10-CM

## 2020-03-30 DIAGNOSIS — R41841 Cognitive communication deficit: Secondary | ICD-10-CM

## 2020-03-30 NOTE — Therapy (Signed)
[facility] [address] [place], [place], [address] Phone: [phone]   Fax:  [phone]  Occupational Therapy Treatment  Patient Details  Name: Jill Rose MRN: [id number] Date of Birth: [birth year] No data recorded  Encounter Date: 03/30/2020   OT End of Session - 03/30/20 1633    Visit Number 3    Number of Visits 17    Date for OT Re-Evaluation 05/16/20    Authorization Type [employer] MCR    Authorization - Visit Number 3    Progress Note Due on Visit 10    OT Start Time 1450    OT Stop Time 1530    OT Time Calculation (min) 40 min    Activity Tolerance Patient tolerated treatment well    Behavior During Therapy [facility] for tasks assessed/performed           Past Medical History:  Diagnosis Date   Diabetes mellitus without complication (HCC)    Essential hypertension    Hypothyroidism    TIA (transient ischemic attack) 2018    History reviewed. No pertinent surgical history.  There were no vitals filed for this visit.   Subjective Assessment - 03/30/20 1446    Subjective  Pt denies pain    Pertinent History Rt MCA CVA 01/05/20. PMH: HTN, HLD, T2DM, Diabetic neuropathy    Currently in Pain? No/denies                    Treatment: Pt practiced bed mobility , requiring mod v.c/ facilitation for weightbearing through LUE. Seated lateral weight shifts, A-P tilts, and then diagonals while weightbearing through elbow followed by functional reach to L side requiring lateral weight shift, min-mod facilitation/ v.c. Pt placed graded clothespins on antennae with LUE for sustained pinch. Flipping and dealing cards with LUE, min v.c Pt transferred to and from mat with RW  minguard and v.c for larger step size.             OT Education - 03/30/20 1635    Education Details supine closed chain shoulder flexion, chest press, see pt instructions.    Person(s) Educated Patient;Caregiver(s)     Methods Explanation;Demonstration;Verbal cues;Handout    Comprehension Verbalized understanding;Returned demonstration;Verbal cues required            OT Short Term Goals - 03/19/20 1447      OT SHORT TERM GOAL #1   Title Independent with coordination HEP Lt hand    Time 4    Period Weeks    Status On-going   Issued coordination HEP 03/19/20     OT SHORT TERM GOAL #2   Title Pt to perform UE dressing with min assist or less and LE dressing with mod assist or less    Time 4    Period Weeks    Status On-going   Pt reports doing approximately 50% of UB dressing 03/19/20     OT SHORT TERM GOAL #3   Title Pt to perform simple snack prep/cold meal prep standing level with DME and supervision prn    Time 4    Period Weeks    Status On-going   Pt reports assisting with cutting vegetables with husband 03/19/20 - seated.     OT SHORT TERM GOAL #4   Title Pt to perform sustained high level reaching LUE for endurance    Time 4    Period Weeks    Status New      OT SHORT TERM  GOAL #5   Title Pt to return to putting up/styling hair mod I level    Time 4    Period Weeks    Status On-going   Pt reports needing assistance for clasps and barettes            OT Long Term Goals - 03/16/20 1518      OT LONG TERM GOAL #1   Title Independent with strengthening HEP for LUE    Time 8    Period Weeks    Status New      OT LONG TERM GOAL #2   Title Improve coordination Lt hand as evidenced by performing 9 hole peg test in 41 sec or less    Baseline 48.59 sec    Time 8    Period Weeks    Status New      OT LONG TERM GOAL #3   Title Pt to be mod I level with all BADLS    Time 8    Period Weeks    Status New      OT LONG TERM GOAL #4   Title Pt to cook simple meal with DME prn and distant supervision    Time 8    Period Weeks    Status New      OT LONG TERM GOAL #5   Title Pt to perform light cleaning tasks (washing dishes, folding laundry, etc) mod I level    Time 8    Period  Weeks    Status New                 Plan - 03/30/20 1447    Clinical Impression Statement Pt is progressing towards goals. Pt demonstrates poor body awareness and is very fearful of falling. Pt requires mod-max facilitation for lateral weight shift to left.    OT Occupational Profile and History Detailed Assessment- Review of Records and additional review of physical, cognitive, psychosocial history related to current functional performance    Occupational performance deficits (Please refer to evaluation for details): ADL's;IADL's    Body Structure / Function / Physical Skills ADL;ROM;Balance;Body mechanics;Mobility;Flexibility;Strength;Coordination;FMC;UE functional use;Decreased knowledge of use of DME    Rehab Potential Good    Clinical Decision Making Several treatment options, min-mod task modification necessary    Comorbidities Affecting Occupational Performance: May have comorbidities impacting occupational performance    Modification or Assistance to Complete Evaluation  Min-Moderate modification of tasks or assist with assess necessary to complete eval    OT Frequency 2x / week    OT Duration 8 weeks   Plus eval   OT Treatment/Interventions Self-care/ADL training;Therapeutic exercise;Functional Mobility Training;Neuromuscular education;Manual Therapy;Therapeutic activities;Coping strategies training;DME and/or AE instruction;Cognitive remediation/compensation;Moist Heat;Passive range of motion;Patient/family education    Plan neuro re-ed, reaching to left side, lateral weight shifts, review supine closed chain shoulder flexion.    Consulted and Agree with Plan of Care Patient;Family member/caregiver    Family Member Consulted caregiver CNA           Patient will benefit from skilled therapeutic intervention in order to improve the following deficits and impairments:   Body Structure / Function / Physical Skills: ADL, ROM, Balance, Body mechanics, Mobility, Flexibility,  Strength, Coordination, FMC, UE functional use, Decreased knowledge of use of DME       Visit Diagnosis: Hemiplegia and hemiparesis following cerebral infarction affecting left non-dominant side (HCC)  Muscle weakness (generalized)  Other lack of coordination  Other symptoms and signs involving the nervous system  Problem List Patient Active Problem List   Diagnosis Date Noted   Abnormality of gait 02/03/2020   Benign essential HTN    CKD (chronic kidney disease), stage II    Hemiparesis affecting left side as late effect of stroke (HCC)    Labile blood glucose    Labile blood pressure    Elevated BUN    Hyponatremia    Diabetic peripheral neuropathy (HCC)    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   Dyslipidemia    Class 1 obesity due to excess calories with serious comorbidity and body mass index (BMI) of 30.0 to 30.9 in adult    Left hemiparesis (HCC)    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 [place], [place], [address] Phone: [phone]   Fax:  [phone]  Name: Jill Rose MRN: [id number] Date of Birth: 03-13-1941

## 2020-03-30 NOTE — Therapy (Signed)
[facility] [address] [place], [place], [address] Phone: [phone]   Fax:  [phone]  Speech Language Pathology Treatment  Patient Details  Name: Jill Rose MRN: [id number] Date of Birth: [birth year] Referring Provider (SLP): [name], MD   Encounter Date: 03/30/2020   End of Session - 03/30/20 1740    Visit Number 4    Number of Visits 17    Date for SLP Re-Evaluation 05/15/20    SLP Start Time 1318    SLP Stop Time  1400    SLP Time Calculation (min) 42 min    Activity Tolerance Patient tolerated treatment well           Past Medical History:  Diagnosis Date  . Diabetes mellitus without complication (HCC)   . Essential hypertension   . Hypothyroidism   . TIA (transient ischemic attack) 2018    No past surgical history on file.  There were no vitals filed for this visit.   Subjective Assessment - 03/30/20 1736    Subjective Alone for ST today    Currently in Pain? No/denies                 ADULT SLP TREATMENT - 03/30/20 1736      General Information   Behavior/Cognition Alert;Cooperative;Confused;Requires cueing      Treatment Provided   Treatment provided Cognitive-Linquistic      Pain Assessment   Pain Assessment No/denies pain      Cognitive-Linquistic Treatment   Treatment focused on Cognition    Skilled Treatment With cues pt took homework from her purse, partially completed. For simple responsive naming task, pt unaware of errors; when prompted to re-read pt often only read 1-2 words on the right side of the page, missing descriptor on the left. Usual mod cues for attention to left. Usual cues for awareness of errors in simple calendar/temporal orientation tasks. Additional home tasks assigned.      Assessment / Recommendations / Plan   Plan Continue with current plan of care      Progression Toward Goals   Progression toward goals Progressing toward goals                SLP Short Term Goals - 03/30/20 1741      SLP SHORT TERM GOAL #1   Title pt will *attempt to access* her memory system for simple organization of discipline-specific items, or for temporal orientation x5 sessions    Time 2    Period Weeks    Status On-going      SLP SHORT TERM GOAL #2   Title pt will demo knowledge of items in her memory system/notebook by correctly accessing them in 2 sessions    Time 2    Period Weeks    Status On-going      SLP SHORT TERM GOAL #3   Title pt will demonstrate error awareness in therapy tasks 75% with min cues for re-check in 3 sessions    Time 2    Period Weeks    Status On-going      SLP SHORT TERM GOAL #4   Title pt will name 8 items for simple categories x 3 sessions    Time 2    Period Weeks    Status On-going            SLP Long Term Goals - 03/30/20 1741      SLP LONG TERM GOAL #1   Title pt  will demo correct usage of a memory compensation system for appointment and/or logging blood pressure and blood sugar, daily schedules in 3 sessions    Time 6    Period Weeks    Status On-going      SLP LONG TERM GOAL #2   Title Pt will maintain selective attention in functional task for 8 minutes in mod noisy environment with rare min cues x 2 sessions.    Time 6    Period Weeks    Status On-going      SLP LONG TERM GOAL #3   Title pt will demo anticipatory awareness regarding deficit areas, independently, in 3 sessions    Time 6    Period Weeks    Status On-going      SLP LONG TERM GOAL #4   Title pt will use compensations for anomia in 8 minutes simple conversation x3 visits    Time 6    Period Weeks    Status On-going            Plan - 03/30/20 1741    Clinical Impression Statement Mrs. Fernandez presents with overall moderate cognitive communication deficits. Pt would benefit from audiological eval. Suspect attention, primarily, and slow processing contributes to this difficulty. Prior to her CVA pt enjoyed  cooking and reading about natural medicine/ ayurveda. I recommend skilled ST to address cognitive and language deficits for better communication with family and friends, to increase independence and improve quality of life.    Speech Therapy Frequency 2x / week    Duration --   8 weeks or 17 visits   Treatment/Interventions SLP instruction and feedback;Functional tasks;Cognitive reorganization;Compensatory strategies;Internal/external aids;Multimodal communcation approach;Patient/family education;Environmental controls;Language facilitation;Compensatory techniques    Potential to Achieve Goals Good    Consulted and Agree with Plan of Care Patient           Patient will benefit from skilled therapeutic intervention in order to improve the following deficits and impairments:   Cognitive communication deficit    Problem List Patient Active Problem List   Diagnosis Date Noted  . Abnormality of gait 02/03/2020  . Benign essential HTN   . CKD (chronic kidney disease), stage II   . Hemiparesis affecting left side as late effect of stroke (HCC)   . Labile blood glucose   . Labile blood pressure   . Elevated BUN   . Hyponatremia   . Diabetic peripheral neuropathy (HCC)   . 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 Dyslipidemia   . Class 1 obesity due to excess calories with serious comorbidity and body mass index (BMI) of 30.0 to 30.9 in adult   . Left hemiparesis (HCC)   . Controlled type 2 diabetes mellitus with hyperglycemia (HCC)   . Morbid obesity (HCC)   . Leukocytosis   . History of TIA (transient ischemic attack)   . Acute CVA (cerebrovascular accident) (HCC) 01/06/2020  . Acute ischemic stroke (HCC) 01/05/2020  . Hypertensive emergency 01/26/2017  . TIA (transient ischemic attack) 01/26/2017  . Hypothyroidism 01/26/2017  . Aphasia   . Essential hypertension   . Diabetes mellitus without complication [facility])    Rondel Baton, MS, CCC-SLP Speech-Language  Pathologist   Arlana Lindau 03/30/2020, 5:42 PM  [facility] [address] [place], [place], [address] Phone: [phone]   Fax:  [phone]   Name: HARJIT DOUDS MRN: [id number] Date of Birth: 07-26-1940

## 2020-03-30 NOTE — Patient Instructions (Signed)
Cane Exercise: Flexion    Lie on back, holding paper towel roll with hands facing each other  Keeping arms as straight as possible, raise paper towel roll to overhead. Hold __5__ seconds. Repeat __10__ times. Do __2__ sessions per day. Repeat performing chest press, 10 reps 2x day.  http://gt2.exer.us/91   Copyright  VHI. All rights reserved.

## 2020-03-31 NOTE — Therapy (Signed)
[facility] [address] [place], [place], [address] Phone: [phone]   Fax:  [phone]  Physical Therapy Treatment  Patient Details  Name: Jill Rose MRN: [id number] Date of Birth: 27-Aug-1940 Referring Provider (PT): Fortino Sic, [place] (being followed by Dr. Posey Pronto)   Encounter Date: 03/30/2020   PT End of Session - 03/30/20 1407    Visit Number 7    Number of Visits 17    Date for PT Re-Evaluation 05/13/20   written for 60 day POC   Authorization Type [employer]- $35 co pay per day regardless of discipline    PT Start Time 1402    PT Stop Time 1445    PT Time Calculation (min) 43 min    Equipment Utilized During Treatment Gait belt    Activity Tolerance Patient tolerated treatment well;Patient limited by fatigue    Behavior During Therapy [facility] for tasks assessed/performed           Past Medical History:  Diagnosis Date  . Diabetes mellitus without complication ([place])   . Essential hypertension   . Hypothyroidism   . TIA (transient ischemic attack) 2018    History reviewed. No pertinent surgical history.  There were no vitals filed for this visit.   Subjective Assessment - 03/30/20 1406    Subjective No falls. Did have some left LE cramping last night, better now. No falls. Has been walking around house with caregiver.    Patient is accompained by: Family member   CNA in lobby   Pertinent History diabetes mellitus, HTN, hypothyroidism, TIA (2018)    Currently in Pain? No/denies    Pain Score 0-No pain              [facility] PT Assessment - 03/30/20 1408      Standardized Balance Assessment   Standardized Balance Assessment 10 meter walk test    10 Meter Walk 193.66 sec's= 0.16 ft/sec   3 minutes 13.66 sec's, with RW with supervision                        [facility] Adult PT Treatment/Exercise - 03/30/20 1408      Transfers   Transfers Sit to Stand;Stand to Sit    Sit to  Stand 4: Min guard;With upper extremity assist;From chair/3-in-1;From bed    Stand to Sit 4: Min guard;With upper extremity assist;To bed;To chair/3-in-1      Ambulation/Gait   Ambulation/Gait Yes    Ambulation/Gait Assistance 4: Min guard;4: Min assist    Ambulation/Gait Assistance Details w/c follow however not needed. max cues for increased step length for increased gait speed. after 10 meter test distance PTA provided faciation to walker for increased speed with facilaition to pt for increased weight shifting to allow for increased bil step lenght.     Ambulation Distance (Feet) 60 Feet   x1, plus around gym with session   Assistive device Rolling walker    Gait Pattern Step-to pattern;Decreased step length - right;Decreased step length - left;Decreased dorsiflexion - left;Decreased dorsiflexion - right;Right foot flat;Left foot flat;Poor foot clearance - left;Poor foot clearance - right;Shuffle;Step-through pattern;Decreased stance time - right;Decreased stride length    Ambulation Surface Level;Indoor      Neuro Re-ed    Neuro Re-ed Details  in paralell bars with bil UE support: alternating fwd foot taps to 4 inch box for 10 reps each LE. cues to lift foot off, not slide it off the  box and for weight shifting. min guard assist for balance; side steping left<>right for 2 laps each way with max cues for posture and increased bil step length. min guard assist.       Knee/Hip Exercises: Aerobic   Nustep level 2.0 with UE/LE's for 5 minutes with goal >/= 40 steps per minute for strengthening and activity tolerance                    PT Short Term Goals - 03/19/20 1534      PT SHORT TERM GOAL #1   Title Pt will be independent with initial HEP in order to build upon functional gains made in therapy. ALL STGS DUE 03/14/20    Baseline 03/19/20: met with current program with CNA assistance.    Time --    Period --    Status Achieved    Target Date --      PT SHORT TERM GOAL #2    Title Pt will undergo further assessment of TUG with STG and LTG to be written as appropriate.    Baseline 03/19/20: 163.53 sec's (2 minutes, 43.53 sec's) scored today as baseline, LTG to be set by primary PT    Time --    Period --    Status Achieved      PT SHORT TERM GOAL #3   Title Pt will decr 5x sit <> stand time with single UE support to 45 seconds or less in order to demo improved BLE strength.    Baseline 03/09/20: 53.79 seconds as baseline, not reassesed on 03/19/20 as it was just done session prior to this one    Status Not Met      PT SHORT TERM GOAL #4   Title Pt will undergo further assessment of BERG with LTG to be written as appropriate.    Baseline 03/19/20: 23/56 scored today as baseline, primary PT to set LTGs    Time --    Period --    Status Achieved      PT SHORT TERM GOAL #5   Title Pt will ambulate at least 66' with RW with supervision in order to improve household mobility.    Baseline 03/19/20: 62 feet with RW with min guard assist. improved from 20 feet, just not to goal    Time --    Period --    Status Not Met      PT SHORT TERM GOAL #6   Title Pt will perform stand pivot transfers and sit <> stands with RW with supervision in order to improve functional mobility.    Baseline 03/19/20: met in session today from RW to mat table    Time --    Period --    Status Achieved             PT Long Term Goals - 03/23/20 1128      PT LONG TERM GOAL #1   Title Pt will be independent with final HEP in order to build upon functional gains made in therapy. ALL LTGS DUE 04/11/20    Time 8    Period Weeks    Status New      PT LONG TERM GOAL #2   Title Pt will decrease TUG time to 2 minutes or less with RW in order to demo decr fall risk.    Baseline 2 minutes, 43.53 sec's    Time 8    Period Weeks    Status Revised  PT LONG TERM GOAL #3   Title Pt will improve BERG score to at least a 30/56 in order to demo decr fall risk.    Baseline 23/56    Time 8     Period Weeks    Status Revised      PT LONG TERM GOAL #4   Title Pt will decr 5x sit <> stand time to 35 seconds or less from mat table with UE support in order to demo improved functional BLE strength.    Baseline 53.79 seconds from mat table    Time 8    Period Weeks    Status Revised      PT LONG TERM GOAL #5   Title Pt will ambulate at least 230' with RW vs. LRAD with supervision in order to demo improved gait efficiency.    Time 8    Period Weeks    Status New      PT LONG TERM GOAL #6   Title Pt will perform all bed mobility with supervision in order to decr caregiver burden.    Time 8    Period Weeks    Status New                 Plan - 03/30/20 1408    Clinical Impression Statement Today's skilled session continued to address gait and strengthening with rest breaks taken as needed. Established pt's 10 meter gait speed baseline of 0.16 t/sec with RW. The pt is progressing toward goals and should benefit from continued PT to progress toward unmet goals.    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    Examination-Activity Limitations Bed Mobility;Bend;Locomotion Level;Stairs;Stand;Transfers;Squat    Examination-Participation Restrictions Community Activity    Stability/Clinical Decision Making Stable/Uncomplicated    Rehab Potential Good    PT Frequency 2x / week    PT Duration 8 weeks    PT Treatment/Interventions ADLs/Self Care Home Management;Gait training;DME Instruction;Stair training;Functional mobility training;Therapeutic activities;Neuromuscular re-education;Balance training;Therapeutic exercise;Patient/family education;Orthotic Fit/Training;Energy conservation;Passive range of motion    PT Next Visit Plan continue with nustep for stengthening/activity tolerance/working on increased speed with mobility,  continue to work on LE strengthening, gait and transfer training with RW,    Consulted and Agree with Plan of Care Patient;Family member/caregiver    Family Member Consulted CNA Charlotted           Patient will benefit from skilled therapeutic intervention in order to improve the following deficits and impairments:  Abnormal gait, Decreased balance, Decreased activity tolerance, Decreased coordination, Decreased endurance, Decreased knowledge of use of DME, Decreased range of motion, Difficulty walking, Decreased strength, Postural dysfunction  Visit Diagnosis: Hemiplegia and hemiparesis following cerebral infarction affecting left non-dominant side (HCC)  Muscle weakness (generalized)  Unsteadiness on feet     Problem List Patient Active Problem List   Diagnosis Date Noted  . Abnormality of gait 02/03/2020  . Benign essential HTN   . CKD (chronic kidney disease), stage II   . Hemiparesis affecting left side as late effect of stroke ([place])   . Labile blood glucose   . Labile blood pressure   . Elevated BUN   . Hyponatremia   . Diabetic peripheral neuropathy ([place])   . Right middle cerebral artery stroke ([place]) 01/14/2020  . Dyslipidemia   . Class 1 obesity due to excess calories with serious comorbidity and body mass index (BMI)  of 30.0 to 30.9 in adult   . Left hemiparesis ([place])   . Controlled type 2 diabetes mellitus with hyperglycemia ([place])   . Morbid obesity ([place])   . Leukocytosis   . History of TIA (transient ischemic attack)   . Acute CVA (cerebrovascular accident) ([place]) 01/06/2020  . Acute ischemic stroke ([place]) 01/05/2020  . Hypertensive emergency 01/26/2017  . TIA (transient ischemic attack) 01/26/2017  . Hypothyroidism 01/26/2017  . Aphasia   . Essential hypertension   . Diabetes mellitus without complication ([place])     Willow Ora, PTA, [facility] [address] [place], [place] [address] [phone] 03/31/20, 1:43 PM    Name: DARION JUHASZ MRN: [id number] Date of Birth: [birth year]

## 2020-04-01 ENCOUNTER — Ambulatory Visit: Payer: Medicare HMO | Admitting: Speech Pathology

## 2020-04-01 ENCOUNTER — Encounter: Payer: Self-pay | Admitting: Physical Therapy

## 2020-04-01 ENCOUNTER — Other Ambulatory Visit: Payer: Self-pay

## 2020-04-01 ENCOUNTER — Ambulatory Visit: Payer: Medicare HMO | Admitting: Occupational Therapy

## 2020-04-01 ENCOUNTER — Ambulatory Visit: Payer: Medicare HMO | Admitting: Physical Therapy

## 2020-04-01 DIAGNOSIS — I69354 Hemiplegia and hemiparesis following cerebral infarction affecting left non-dominant side: Secondary | ICD-10-CM

## 2020-04-01 DIAGNOSIS — R41841 Cognitive communication deficit: Secondary | ICD-10-CM

## 2020-04-01 DIAGNOSIS — R278 Other lack of coordination: Secondary | ICD-10-CM

## 2020-04-01 DIAGNOSIS — M6281 Muscle weakness (generalized): Secondary | ICD-10-CM

## 2020-04-01 DIAGNOSIS — R2681 Unsteadiness on feet: Secondary | ICD-10-CM

## 2020-04-01 DIAGNOSIS — R4701 Aphasia: Secondary | ICD-10-CM

## 2020-04-01 NOTE — Therapy (Signed)
[facility] [address] [place], [place], [address] Phone: [phone]   Fax:  [phone]  Occupational Therapy Treatment  Patient Details  Name: Jill Rose MRN: [id number] Date of Birth: [birth year] No data recorded  Encounter Date: 04/01/2020   OT End of Session - 04/01/20 1635    Visit Number 4    Number of Visits 17    Date for OT Re-Evaluation 05/16/20    Authorization Type Aetna MCR    Authorization - Visit Number 4    Progress Note Due on Visit 10    OT Start Time 1530    OT Stop Time 1615    OT Time Calculation (min) 45 min    Activity Tolerance Patient tolerated treatment well    Behavior During Therapy [facility] for tasks assessed/performed           Past Medical History:  Diagnosis Date  . Diabetes mellitus without complication (HCC)   . Essential hypertension   . Hypothyroidism   . TIA (transient ischemic attack) 2018    No past surgical history on file.  There were no vitals filed for this visit.   Subjective Assessment - 04/01/20 1533    Subjective  Pt denies pain    Pertinent History Rt MCA CVA 01/05/20. PMH: HTN, HLD, T2DM, Diabetic neuropathy    Limitations no driving, fall risk, HOH    Patient Stated Goals return to cooking    Currently in Pain? No/denies           Pt practiced doffing jacket with min cues, but max cues and min assist to don jacket. Pt kept trying to place arm in hood. Pt doffing Rt shoe I'ly but required assist to doff Lt shoe d/t swelling. Pt asked to doff Rt sock but pt kept trying to do with Lt foot vs. Reaching down and attempting with hand(s). Pt provided foot stool and asked her to reach toward sock - pt then doffed Rt sock but still required mod assist to don - did not attempt Lt due to swelling.   Pt placing shower rings on pronged ladder LUE without difficulty and then removing. Pt w/ noted Lt inattention and worked on bilateral forced use to braid string and  simulate tying shoe laces with initial max cueing.  Pt asked to copy peg design - pt required max cues to copy simple square due to visual/perceptual deficits.  Pt transferred from mat to w/c towards strong Rt side with max simple cueing for foot placement/directional changes.                        OT Short Term Goals - 03/19/20 1447      OT SHORT TERM GOAL #1   Title Independent with coordination HEP Lt hand    Time 4    Period Weeks    Status On-going   Issued coordination HEP 03/19/20     OT SHORT TERM GOAL #2   Title Pt to perform UE dressing with min assist or less and LE dressing with mod assist or less    Time 4    Period Weeks    Status On-going   Pt reports doing approximately 50% of UB dressing 03/19/20     OT SHORT TERM GOAL #3   Title Pt to perform simple snack prep/cold meal prep standing level with DME and supervision prn    Time 4    Period Weeks  Status On-going   Pt reports assisting with cutting vegetables with husband 03/19/20 - seated.     OT SHORT TERM GOAL #4   Title Pt to perform sustained high level reaching LUE for endurance    Time 4    Period Weeks    Status New      OT SHORT TERM GOAL #5   Title Pt to return to putting up/styling hair mod I level    Time 4    Period Weeks    Status On-going   Pt reports needing assistance for clasps and barettes            OT Long Term Goals - 03/16/20 1518      OT LONG TERM GOAL #1   Title Independent with strengthening HEP for LUE    Time 8    Period Weeks    Status New      OT LONG TERM GOAL #2   Title Improve coordination Lt hand as evidenced by performing 9 hole peg test in 41 sec or less    Baseline 48.59 sec    Time 8    Period Weeks    Status New      OT LONG TERM GOAL #3   Title Pt to be mod I level with all BADLS    Time 8    Period Weeks    Status New      OT LONG TERM GOAL #4   Title Pt to cook simple meal with DME prn and distant supervision    Time 8    Period  Weeks    Status New      OT LONG TERM GOAL #5   Title Pt to perform light cleaning tasks (washing dishes, folding laundry, etc) mod I level    Time 8    Period Weeks    Status New                 Plan - 04/01/20 1635    Clinical Impression Statement Pt with significant spatial awareness and visual perceptual deficits, Lt inattention, and apraxia. Pt required simple step by step cueing for directional change to transfer from mat back to w/c (on strong Rt side)    OT Occupational Profile and History Detailed Assessment- Review of Records and additional review of physical, cognitive, psychosocial history related to current functional performance    Occupational performance deficits (Please refer to evaluation for details): ADL's;IADL's    Body Structure / Function / Physical Skills ADL;ROM;Balance;Body mechanics;Mobility;Flexibility;Strength;Coordination;FMC;UE functional use;Decreased knowledge of use of DME    Rehab Potential Good    Clinical Decision Making Several treatment options, min-mod task modification necessary    Comorbidities Affecting Occupational Performance: May have comorbidities impacting occupational performance    Modification or Assistance to Complete Evaluation  Min-Moderate modification of tasks or assist with assess necessary to complete eval    OT Frequency 2x / week    OT Duration 8 weeks   Plus eval   OT Treatment/Interventions Self-care/ADL training;Therapeutic exercise;Functional Mobility Training;Neuromuscular education;Manual Therapy;Therapeutic activities;Coping strategies training;DME and/or AE instruction;Cognitive remediation/compensation;Moist Heat;Passive range of motion;Patient/family education    Plan neuro re-ed, reaching to left side, lateral weight shifts, review supine closed chain shoulder flexion.    Consulted and Agree with Plan of Care Patient;Family member/caregiver    Family Member Consulted caregiver CNA           Patient will  benefit from skilled therapeutic intervention in order to improve the following deficits  and impairments:   Body Structure / Function / Physical Skills: ADL, ROM, Balance, Body mechanics, Mobility, Flexibility, Strength, Coordination, FMC, UE functional use, Decreased knowledge of use of DME       Visit Diagnosis: Hemiplegia and hemiparesis following cerebral infarction affecting left non-dominant side (HCC)  Other lack of coordination  Muscle weakness (generalized)    Problem List Patient Active Problem List   Diagnosis Date Noted  . Abnormality of gait 02/03/2020  . Benign essential HTN   . CKD (chronic kidney disease), stage II   . Hemiparesis affecting left side as late effect of stroke (HCC)   . Labile blood glucose   . Labile blood pressure   . Elevated BUN   . Hyponatremia   . Diabetic peripheral neuropathy (HCC)   . 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 Dyslipidemia   . Class 1 obesity due to excess calories with serious comorbidity and body mass index (BMI) of 30.0 to 30.9 in adult   . Left hemiparesis (HCC)   . Controlled type 2 diabetes mellitus with hyperglycemia (HCC)   . Morbid obesity (HCC)   . Leukocytosis   . History of TIA (transient ischemic attack)   . Acute CVA (cerebrovascular accident) (HCC) 01/06/2020  . Acute ischemic stroke (HCC) 01/05/2020  . Hypertensive emergency 01/26/2017  . TIA (transient ischemic attack) 01/26/2017  . Hypothyroidism 01/26/2017  . Aphasia   . Essential hypertension   . Diabetes mellitus without complication [facility])     Kelli Churn, OTR/L 04/01/2020, 4:42 PM  [facility] [address] [place], [place], [address] Phone: [phone]   Fax:  [phone]  Name: Jill Rose MRN: [id number] Date of Birth: October 23, 1940

## 2020-04-01 NOTE — Therapy (Signed)
[facility] [address] [place], [place], [address] Phone: [phone]   Fax:  [phone]  Speech Language Pathology Treatment  Patient Details  Name: Jill Rose MRN: [id number] Date of Birth: 1941-03-02 Referring Provider (SLP): [name], MD   Encounter Date: 04/01/2020   End of Session - 04/01/20 1820    Visit Number 5    Number of Visits 17    Date for SLP Re-Evaluation 05/15/20    SLP Start Time 1617    SLP Stop Time  1700    SLP Time Calculation (min) 43 min    Activity Tolerance Patient tolerated treatment well           Past Medical History:  Diagnosis Date  . Diabetes mellitus without complication (HCC)   . Essential hypertension   . Hypothyroidism   . TIA (transient ischemic attack) 2018    No past surgical history on file.  There were no vitals filed for this visit.   Subjective Assessment - 04/01/20 1815    Subjective "I forgot my homework."    Patient is accompained by: --   CNA   Currently in Pain? No/denies                 ADULT SLP TREATMENT - 04/01/20 1815      General Information   Behavior/Cognition Alert;Cooperative;Confused;Requires cueing      Treatment Provided   Treatment provided Cognitive-Linquistic      Pain Assessment   Pain Assessment No/denies pain      Cognitive-Linquistic Treatment   Treatment focused on Cognition;Patient/family/caregiver education;Aphasia    Skilled Treatment CNA reports pt has been "down and depressed," in recent days about communication difficulties and fearing she is a burden on her family. Pt reported frustration with not being able to express herself as she wants. SLP provided handout and education to pt and caregiver on compensations for anomia, explained we will continue to work on these in therapy. Requested caregiver assist pt in obtaining a binder for therapies, provided handout for weekly schedule and Sept calendar. Pt  oriented to date, required extended time and usual mod cues to attend to items on left side of page when numbering weekly planner dates. Repetition and information chunking necessary, as well as usual cues for error awareness when inputting appointment information.       Assessment / Recommendations / Plan   Plan Continue with current plan of care      Progression Toward Goals   Progression toward goals Progressing toward goals              SLP Short Term Goals - 04/01/20 1816      SLP SHORT TERM GOAL #1   Title pt will *attempt to access* her memory system for simple organization of discipline-specific items, or for temporal orientation x5 sessions    Time 2    Period Weeks    Status On-going      SLP SHORT TERM GOAL #2   Title pt will demo knowledge of items in her memory system/notebook by correctly accessing them in 2 sessions    Time 2    Period Weeks    Status On-going      SLP SHORT TERM GOAL #3   Title pt will demonstrate error awareness in therapy tasks 75% with min cues for re-check in 3 sessions    Time 2    Period Weeks    Status On-going  SLP SHORT TERM GOAL #4   Title pt will name 8 items for simple categories x 3 sessions    Time 2    Period Weeks    Status On-going            SLP Long Term Goals - 04/01/20 1816      SLP LONG TERM GOAL #1   Title pt will demo correct usage of a memory compensation system for appointment and/or logging blood pressure and blood sugar, daily schedules in 3 sessions    Time 6    Period Weeks    Status On-going      SLP LONG TERM GOAL #2   Title Pt will maintain selective attention in functional task for 8 minutes in mod noisy environment with rare min cues x 2 sessions.    Time 6    Period Weeks    Status On-going      SLP LONG TERM GOAL #3   Title pt will demo anticipatory awareness regarding deficit areas, independently, in 3 sessions    Time 6    Period Weeks    Status On-going      SLP LONG TERM GOAL  #4   Title pt will use compensations for anomia in 8 minutes simple conversation x3 visits    Time 6    Period Weeks    Status On-going            Plan - 04/01/20 1820    Clinical Impression Statement Mrs. Szymanowski presents with overall moderate cognitive communication deficits. Pt would benefit from audiological eval. Suspect attention, primarily, and slow processing contributes to this difficulty. Prior to her CVA pt enjoyed cooking and reading about natural medicine/ ayurveda. I recommend skilled ST to address cognitive and language deficits for better communication with family and friends, to increase independence and improve quality of life.    Speech Therapy Frequency 2x / week    Duration --   8 weeks or 17 visits   Treatment/Interventions SLP instruction and feedback;Functional tasks;Cognitive reorganization;Compensatory strategies;Internal/external aids;Multimodal communcation approach;Patient/family education;Environmental controls;Language facilitation;Compensatory techniques    Potential to Achieve Goals Good    Consulted and Agree with Plan of Care Patient           Patient will benefit from skilled therapeutic intervention in order to improve the following deficits and impairments:   Cognitive communication deficit  Aphasia    Problem List Patient Active Problem List   Diagnosis Date Noted  . Abnormality of gait 02/03/2020  . Benign essential HTN   . CKD (chronic kidney disease), stage II   . Hemiparesis affecting left side as late effect of stroke (HCC)   . Labile blood glucose   . Labile blood pressure   . Elevated BUN   . Hyponatremia   . Diabetic peripheral neuropathy (HCC)   . 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 Dyslipidemia   . Class 1 obesity due to excess calories with serious comorbidity and body mass index (BMI) of 30.0 to 30.9 in adult   . Left hemiparesis (HCC)   . Controlled type 2 diabetes mellitus with hyperglycemia (HCC)     . Morbid obesity (HCC)   . Leukocytosis   . History of TIA (transient ischemic attack)   . Acute CVA (cerebrovascular accident) (HCC) 01/06/2020  . Acute ischemic stroke (HCC) 01/05/2020  . Hypertensive emergency 01/26/2017  . TIA (transient ischemic attack) 01/26/2017  . Hypothyroidism 01/26/2017  . Aphasia   . Essential hypertension   .  Diabetes mellitus without complication [facility])    Rondel Baton, MS, CCC-SLP Speech-Language Pathologist  Arlana Lindau 04/01/2020, 6:21 PM  [facility] [address] [place], [place], [address] Phone: [phone]   Fax:  [phone]   Name: CRYSTALL DONALDSON MRN: [id number] Date of Birth: [birth year]

## 2020-04-01 NOTE — Patient Instructions (Signed)
Some activities for memory and attention:  Take notes about a tv show you watched (right afterwards) and then later, talk to someone about what you watched. Use your notes if you forget something.  Try to remember and write down the ingredients and steps of a recipe that looked interesting.   Make a schedule or to-do list for every day. Write the date, any appointments, errands or other activities you have planned.   Keep your therapy papers and schedule in a binder. Bring this with you when you come to therapy.  Tips for Talking  . Say one thing at a time . Don't  rush - slow down, be patient . Talk face to face . Reduce background noise . Relax - be natural . Use pen and paper . Write down key words . Draw diagrams or pictures . Don't pretend you understand . Ask what helps . Recap - check you both understand . Have a conversation every day!   Describing words  What group does it belong to?  What do I use it for?  Where can I find it?  What does it LOOK like?  What other words go with it?  What is the 1st sound of the word?   Many Ways to Communicate  Describe it Write it Draw it Gesture it Use related words

## 2020-04-02 NOTE — Therapy (Signed)
[facility] [address] [place], [place], [address] Phone: [phone]   Fax:  [phone]  Physical Therapy Treatment  Patient Details  Name: Jill Rose MRN: [id number] Date of Birth: [birth year] Referring Provider (PT): Fortino Sic, [place] (being followed by Dr. Posey Pronto)   Encounter Date: 04/01/2020   PT End of Session - 04/01/20 1457    Visit Number 8    Number of Visits 17    Date for PT Re-Evaluation 05/13/20   written for 60 day POC   Authorization Type [employer]- $35 co pay per day regardless of discipline    PT Start Time 1448    PT Stop Time 1530    PT Time Calculation (min) 42 min    Equipment Utilized During Treatment Gait belt    Activity Tolerance Patient tolerated treatment well;Patient limited by fatigue    Behavior During Therapy [facility] for tasks assessed/performed           Past Medical History:  Diagnosis Date  . Diabetes mellitus without complication ([place])   . Essential hypertension   . Hypothyroidism   . TIA (transient ischemic attack) 2018    History reviewed. No pertinent surgical history.  There were no vitals filed for this visit.   Subjective Assessment - 04/01/20 1455    Subjective No falls or pain to report. Does report she was tired after last session and felt "unwell" and "tired" yesterday. Upon further questioning pt reports she was depressed yesterday. Spoke to her MD and is to start medication for this.    Patient is accompained by: Family member   CNA   Pertinent History diabetes mellitus, HTN, hypothyroidism, TIA (2018)    Patient Stated Goals wants to be walking more.    Currently in Pain? No/denies    Pain Score 0-No pain                [facility] Adult PT Treatment/Exercise - 04/01/20 1459      Transfers   Transfers Sit to Stand;Stand to Sit    Sit to Stand 4: Min guard;With upper extremity assist;From chair/3-in-1;From bed    Sit to Stand Details Verbal  cues for sequencing;Verbal cues for technique;Verbal cues for safe use of DME/AE    Sit to Stand Details (indicate cue type and reason) cues to scoot to the edge and for hand placement    Stand to Sit 4: Min guard;With upper extremity assist;To bed;To chair/3-in-1    Stand to Sit Details (indicate cue type and reason) Verbal cues for sequencing;Verbal cues for precautions/safety;Verbal cues for safe use of DME/AE;Verbal cues for technique    Stand to Sit Details cues to reach back after turning the entire way to the surface before sitting down      Ambulation/Gait   Ambulation/Gait Yes    Ambulation/Gait Assistance --    Ambulation/Gait Assistance Details moc/max cues/facilitation needed for increased bil step length, continous walker motion and walker position with gait.     Ambulation Distance (Feet) 22 Feet   x2   Assistive device Rolling walker    Gait Pattern Step-to pattern;Decreased step length - right;Decreased step length - left;Decreased dorsiflexion - left;Decreased dorsiflexion - right;Right foot flat;Left foot flat;Poor foot clearance - left;Poor foot clearance - right;Shuffle;Step-through pattern;Decreased stance time - right;Decreased stride length    Ambulation Surface Level;Indoor      Neuro Re-ed    Neuro Re-ed Details  for strengthening/balance/improved gait: standing with RW support alternating fwd/bwd stepping  over black foam beam. min to mod assist with cues for increased step length/height to clear the beam surface with pt only clearing each foot over 2-3 times out of ~15 attempts on each side.       Knee/Hip Exercises: Aerobic   Nustep level 3.0 with UE/LE's for 8 minutes with goal >/= 40 steps per minute for strengthening and activity tolerance               PT Short Term Goals - 03/19/20 1534      PT SHORT TERM GOAL #1   Title Pt will be independent with initial HEP in order to build upon functional gains made in therapy. ALL STGS DUE 03/14/20    Baseline  03/19/20: met with current program with CNA assistance.    Time --    Period --    Status Achieved    Target Date --      PT SHORT TERM GOAL #2   Title Pt will undergo further assessment of TUG with STG and LTG to be written as appropriate.    Baseline 03/19/20: 163.53 sec's (2 minutes, 43.53 sec's) scored today as baseline, LTG to be set by primary PT    Time --    Period --    Status Achieved      PT SHORT TERM GOAL #3   Title Pt will decr 5x sit <> stand time with single UE support to 45 seconds or less in order to demo improved BLE strength.    Baseline 03/09/20: 53.79 seconds as baseline, not reassesed on 03/19/20 as it was just done session prior to this one    Status Not Met      PT SHORT TERM GOAL #4   Title Pt will undergo further assessment of BERG with LTG to be written as appropriate.    Baseline 03/19/20: 23/56 scored today as baseline, primary PT to set LTGs    Time --    Period --    Status Achieved      PT SHORT TERM GOAL #5   Title Pt will ambulate at least 37' with RW with supervision in order to improve household mobility.    Baseline 03/19/20: 62 feet with RW with min guard assist. improved from 20 feet, just not to goal    Time --    Period --    Status Not Met      PT SHORT TERM GOAL #6   Title Pt will perform stand pivot transfers and sit <> stands with RW with supervision in order to improve functional mobility.    Baseline 03/19/20: met in session today from RW to mat table    Time --    Period --    Status Achieved             PT Long Term Goals - 03/23/20 1128      PT LONG TERM GOAL #1   Title Pt will be independent with final HEP in order to build upon functional gains made in therapy. ALL LTGS DUE 04/11/20    Time 8    Period Weeks    Status New      PT LONG TERM GOAL #2   Title Pt will decrease TUG time to 2 minutes or less with RW in order to demo decr fall risk.    Baseline 2 minutes, 43.53 sec's    Time 8    Period Weeks    Status Revised  PT LONG TERM GOAL #3   Title Pt will improve BERG score to at least a 30/56 in order to demo decr fall risk.    Baseline 23/56    Time 8    Period Weeks    Status Revised      PT LONG TERM GOAL #4   Title Pt will decr 5x sit <> stand time to 35 seconds or less from mat table with UE support in order to demo improved functional BLE strength.    Baseline 53.79 seconds from mat table    Time 8    Period Weeks    Status Revised      PT LONG TERM GOAL #5   Title Pt will ambulate at least 230' with RW vs. LRAD with supervision in order to demo improved gait efficiency.    Time 8    Period Weeks    Status New      PT LONG TERM GOAL #6   Title Pt will perform all bed mobility with supervision in order to decr caregiver burden.    Time 8    Period Weeks    Status New                 Plan - 04/01/20 1458    Clinical Impression Statement Today's skilled session focused on gait and strengthening with rest breaks needed due to fatigue. The pt continues to have short step/stride length with significantly decreased gait speed. Will continue to benefit from PT to address these deficits.    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    Examination-Activity Limitations Bed Mobility;Bend;Locomotion Level;Stairs;Stand;Transfers;Squat    Examination-Participation Restrictions Community Activity    Stability/Clinical Decision Making Stable/Uncomplicated    Rehab Potential Good    PT Frequency 2x / week    PT Duration 8 weeks    PT Treatment/Interventions ADLs/Self Care Home Management;Gait training;DME Instruction;Stair training;Functional mobility training;Therapeutic activities;Neuromuscular re-education;Balance training;Therapeutic exercise;Patient/family education;Orthotic Fit/Training;Energy conservation;Passive range of motion     PT Next Visit Plan continue with nustep for stengthening/activity tolerance/working on increased speed with mobility, continue to work on LE strengthening, gait and transfer training with RW,    Consulted and Agree with Plan of Care Patient;Family member/caregiver    Family Member Consulted CNA Charlotted           Patient will benefit from skilled therapeutic intervention in order to improve the following deficits and impairments:  Abnormal gait, Decreased balance, Decreased activity tolerance, Decreased coordination, Decreased endurance, Decreased knowledge of use of DME, Decreased range of motion, Difficulty walking, Decreased strength, Postural dysfunction  Visit Diagnosis: Hemiplegia 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 Abnormality of gait 02/03/2020  . Benign essential HTN   . CKD (chronic kidney disease), stage II   . Hemiparesis affecting left side as late effect of stroke ([place])   . Labile blood glucose   . Labile blood pressure   . Elevated BUN   . Hyponatremia   . Diabetic peripheral neuropathy ([place])   . Right middle cerebral artery stroke ([place]) 01/14/2020  . Dyslipidemia   . Class 1 obesity due to excess calories with serious comorbidity and body mass index (BMI) of 30.0  to 30.9 in adult   . Left hemiparesis ([place])   . Controlled type 2 diabetes mellitus with hyperglycemia ([place])   . Morbid obesity ([place])   . Leukocytosis   . History of TIA (transient ischemic attack)   . Acute CVA (cerebrovascular accident) ([place]) 01/06/2020  . Acute ischemic stroke ([place]) 01/05/2020  . Hypertensive emergency 01/26/2017  . TIA (transient ischemic attack) 01/26/2017  . Hypothyroidism 01/26/2017  . Aphasia   . Essential hypertension   . Diabetes mellitus without complication ([place])       Willow Ora, PTA, [place] [address], [place], [place] [address] [phone] 04/02/20, 5:00 PM   Name: Jill Rose MRN: [id number] Date of Birth: [birth year]

## 2020-04-06 ENCOUNTER — Encounter: Payer: Self-pay | Admitting: Physical Therapy

## 2020-04-06 ENCOUNTER — Ambulatory Visit: Payer: Medicare HMO | Admitting: Occupational Therapy

## 2020-04-06 ENCOUNTER — Encounter: Payer: Medicare HMO | Attending: Physical Medicine & Rehabilitation | Admitting: Physical Medicine & Rehabilitation

## 2020-04-06 ENCOUNTER — Encounter: Payer: Self-pay | Admitting: Physical Medicine & Rehabilitation

## 2020-04-06 ENCOUNTER — Encounter: Payer: Self-pay | Admitting: Occupational Therapy

## 2020-04-06 ENCOUNTER — Ambulatory Visit: Payer: Medicare HMO | Admitting: Physical Therapy

## 2020-04-06 ENCOUNTER — Ambulatory Visit: Payer: Medicare HMO | Admitting: Speech Pathology

## 2020-04-06 ENCOUNTER — Other Ambulatory Visit: Payer: Self-pay

## 2020-04-06 VITALS — BP 147/79 | HR 91 | Temp 98.0°F | Ht 61.0 in | Wt 155.0 lb

## 2020-04-06 DIAGNOSIS — I639 Cerebral infarction, unspecified: Secondary | ICD-10-CM | POA: Diagnosis not present

## 2020-04-06 DIAGNOSIS — R4701 Aphasia: Secondary | ICD-10-CM

## 2020-04-06 DIAGNOSIS — E1165 Type 2 diabetes mellitus with hyperglycemia: Secondary | ICD-10-CM | POA: Insufficient documentation

## 2020-04-06 DIAGNOSIS — M6281 Muscle weakness (generalized): Secondary | ICD-10-CM

## 2020-04-06 DIAGNOSIS — I10 Essential (primary) hypertension: Secondary | ICD-10-CM | POA: Diagnosis not present

## 2020-04-06 DIAGNOSIS — I69354 Hemiplegia and hemiparesis following cerebral infarction affecting left non-dominant side: Secondary | ICD-10-CM

## 2020-04-06 DIAGNOSIS — R278 Other lack of coordination: Secondary | ICD-10-CM

## 2020-04-06 DIAGNOSIS — I63511 Cerebral infarction due to unspecified occlusion or stenosis of right middle cerebral artery: Secondary | ICD-10-CM | POA: Insufficient documentation

## 2020-04-06 DIAGNOSIS — R2681 Unsteadiness on feet: Secondary | ICD-10-CM

## 2020-04-06 DIAGNOSIS — R41841 Cognitive communication deficit: Secondary | ICD-10-CM

## 2020-04-06 DIAGNOSIS — R269 Unspecified abnormalities of gait and mobility: Secondary | ICD-10-CM | POA: Diagnosis present

## 2020-04-06 DIAGNOSIS — R29818 Other symptoms and signs involving the nervous system: Secondary | ICD-10-CM

## 2020-04-06 DIAGNOSIS — G8194 Hemiplegia, unspecified affecting left nondominant side: Secondary | ICD-10-CM | POA: Insufficient documentation

## 2020-04-06 MED ORDER — CLOPIDOGREL BISULFATE 75 MG PO TABS: 75.0000 mg | ORAL_TABLET | Freq: Every day | ORAL | 0 refills | Status: DC

## 2020-04-06 NOTE — Therapy (Signed)
[facility] [address] [place], [place], [address] Phone: [phone]   Fax:  [phone]  Occupational Therapy Treatment  Patient Details  Name: Jill Rose MRN: [id number] Date of Birth: [birth year] No data recorded  Encounter Date: 04/06/2020   OT End of Session - 04/06/20 1410    Visit Number 5    Number of Visits 17    Date for OT Re-Evaluation 05/16/20    Authorization Type [employer] MCR    Authorization - Visit Number 5    Authorization - Number of Visits 10    Progress Note Due on Visit 10    OT Start Time 1407    OT Stop Time 1445    OT Time Calculation (min) 38 min    Activity Tolerance Patient tolerated treatment well    Behavior During Therapy [facility] for tasks assessed/performed           Past Medical History:  Diagnosis Date  . Diabetes mellitus without complication (HCC)   . Essential hypertension   . Hypothyroidism   . TIA (transient ischemic attack) 2018    History reviewed. No pertinent surgical history.  There were no vitals filed for this visit.   Subjective Assessment - 04/06/20 1410    Subjective  Pt denies pain today. Patient reports seeing Dr. Allena Katz yesterday.    Pertinent History Rt MCA CVA 01/05/20. PMH: HTN, HLD, T2DM, Diabetic neuropathy    Limitations no driving, fall risk, HOH    Patient Stated Goals return to cooking    Currently in Pain? No/denies                        OT Treatments/Exercises (OP) - 04/06/20 1412      Transfers   Transfers Sit to Stand    Sit to Stand 4: Min guard    Stand to Sit 4: Min guard   verbal/tactile cues for hand placement   Stand to Sit Details cues for hand placement      Exercises   Exercises Hand;Work Hardening      Visual/Perceptual Exercises   Scanning Tabletop    Scanning - Tabletop 70% 50/72   double number activity - required increased time     Neurological Re-education Exercises   Weight Bearing Position Seated      Seated with weight on hand in LUE. Pt with increased lateral lean to left without fear of falling. Pt with increased weight bearing and suppot with task     Weight Shifting Lateral;Outside Base of Support    Weight-Shifting Exercises - Lateral --   leaning to left with placing.removing large pegs   Weight-Shifting Exercises - Outside Base of Support reaching for 1" blocks to left and crossing midline outside base of support to place in bowl with CGA      Fine Motor Coordination (Hand/Wrist)   Fine Motor Coordination Large Pegboard           Worked on standing tolerance 3 min x 2 with RW with contact guard assistance.          OT Short Term Goals - 03/19/20 1447      OT SHORT TERM GOAL #1   Title Independent with coordination HEP Lt hand    Time 4    Period Weeks    Status On-going   Issued coordination HEP 03/19/20     OT SHORT TERM GOAL #2   Title Pt to perform UE dressing with  min assist or less and LE dressing with mod assist or less    Time 4    Period Weeks    Status On-going   Pt reports doing approximately 50% of UB dressing 03/19/20     OT SHORT TERM GOAL #3   Title Pt to perform simple snack prep/cold meal prep standing level with DME and supervision prn    Time 4    Period Weeks    Status On-going   Pt reports assisting with cutting vegetables with husband 03/19/20 - seated.     OT SHORT TERM GOAL #4   Title Pt to perform sustained high level reaching LUE for endurance    Time 4    Period Weeks    Status New      OT SHORT TERM GOAL #5   Title Pt to return to putting up/styling hair mod I level    Time 4    Period Weeks    Status On-going   Pt reports needing assistance for clasps and barettes            OT Long Term Goals - 03/16/20 1518      OT LONG TERM GOAL #1   Title Independent with strengthening HEP for LUE    Time 8    Period Weeks    Status New      OT LONG TERM GOAL #2   Title Improve coordination Lt hand as evidenced by performing 9  hole peg test in 41 sec or less    Baseline 48.59 sec    Time 8    Period Weeks    Status New      OT LONG TERM GOAL #3   Title Pt to be mod I level with all BADLS    Time 8    Period Weeks    Status New      OT LONG TERM GOAL #4   Title Pt to cook simple meal with DME prn and distant supervision    Time 8    Period Weeks    Status New      OT LONG TERM GOAL #5   Title Pt to perform light cleaning tasks (washing dishes, folding laundry, etc) mod I level    Time 8    Period Weeks    Status New                 Plan - 04/06/20 1424    Clinical Impression Statement Pt presents with poor processing and sequencing with transfers. Bright affect is present today and pt seems positive about her progress. Pt demonstrating poor left inattention and spatial awareness. Increase in lateral lean to left with weight bearing this day without hesitation.    OT Occupational Profile and History Detailed Assessment- Review of Records and additional review of physical, cognitive, psychosocial history related to current functional performance    Occupational performance deficits (Please refer to evaluation for details): ADL's;IADL's    Body Structure / Function / Physical Skills ADL;ROM;Balance;Body mechanics;Mobility;Flexibility;Strength;Coordination;FMC;UE functional use;Decreased knowledge of use of DME    Rehab Potential Good    Clinical Decision Making Several treatment options, min-mod task modification necessary    Comorbidities Affecting Occupational Performance: May have comorbidities impacting occupational performance    Modification or Assistance to Complete Evaluation  Min-Moderate modification of tasks or assist with assess necessary to complete eval    OT Frequency 2x / week    OT Duration 8 weeks   Plus eval  OT Treatment/Interventions Self-care/ADL training;Therapeutic exercise;Functional Mobility Training;Neuromuscular education;Manual Therapy;Therapeutic activities;Coping  strategies training;DME and/or AE instruction;Cognitive remediation/compensation;Moist Heat;Passive range of motion;Patient/family education    Plan visualspatial activities, supine closed chair shoulder exercises    Consulted and Agree with Plan of Care Patient;Family member/caregiver    Family Member Consulted caregiver CNA           Patient will benefit from skilled therapeutic intervention in order to improve the following deficits and impairments:   Body Structure / Function / Physical Skills: ADL, ROM, Balance, Body mechanics, Mobility, Flexibility, Strength, Coordination, FMC, UE functional use, Decreased knowledge of use of DME       Visit Diagnosis: Other lack of coordination  Muscle weakness (generalized)  Hemiplegia and hemiparesis following cerebral infarction affecting left non-dominant side (HCC)  Unsteadiness on feet    Problem List Patient Active Problem List   Diagnosis Date Noted  . Controlled type 2 diabetes mellitus with hyperglycemia, without long-term current use of insulin (HCC) 04/06/2020  . Abnormality of gait 02/03/2020  . Benign essential HTN   . CKD (chronic kidney disease), stage II   . Hemiparesis affecting left side as late effect of stroke (HCC)   . Labile blood glucose   . Labile blood pressure   . Elevated BUN   . Hyponatremia   . Diabetic peripheral neuropathy (HCC)   . 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 Dyslipidemia   . Class 1 obesity due to excess calories with serious comorbidity and body mass index (BMI) of 30.0 to 30.9 in adult   . Left hemiparesis (HCC)   . Controlled type 2 diabetes mellitus with hyperglycemia (HCC)   . Morbid obesity (HCC)   . Leukocytosis   . History of TIA (transient ischemic attack)   . Acute CVA (cerebrovascular accident) (HCC) 01/06/2020  . Acute ischemic stroke (HCC) 01/05/2020  . Hypertensive emergency 01/26/2017  . TIA (transient ischemic attack) 01/26/2017  . Hypothyroidism  01/26/2017  . Aphasia   . Essential hypertension   . Diabetes mellitus without complication [facility])     Junious Dresser MOT, OTR/L  04/06/2020, 2:55 PM  [facility] [address] [place], [place], [address] Phone: [phone]   Fax:  [phone]  Name: Jill Rose MRN: [id number] Date of Birth: February 12, 1941

## 2020-04-06 NOTE — Therapy (Signed)
[facility] [address] [place], [place], [address] Phone: [phone]   Fax:  [phone]  Physical Therapy Treatment  Patient Details  Name: Jill Rose MRN: [id number] Date of Birth: 1941-07-11 Referring Provider (PT): Fortino Sic, [place] (being followed by Dr. Posey Pronto)   Encounter Date: 04/06/2020   PT End of Session - 04/06/20 1831    Visit Number 9    Number of Visits 17    Date for PT Re-Evaluation 05/13/20   written for 60 day POC   Authorization Type Aetna Medicare- $35 co pay per day regardless of discipline    PT Start Time 7106    PT Stop Time 1528    PT Time Calculation (min) 41 min    Equipment Utilized During Treatment Gait belt    Activity Tolerance Patient tolerated treatment well;Patient limited by fatigue    Behavior During Therapy [facility] for tasks assessed/performed           Past Medical History:  Diagnosis Date  . Diabetes mellitus without complication ([place])   . Essential hypertension   . Hypothyroidism   . TIA (transient ischemic attack) 2018    History reviewed. No pertinent surgical history.  There were no vitals filed for this visit.   Subjective Assessment - 04/06/20 1447    Subjective Making lots of improvements. No falls. Doing a lot more walking at home.    Patient is accompained by: Family member   CNA   Pertinent History diabetes mellitus, HTN, hypothyroidism, TIA (2018)    Patient Stated Goals wants to be walking more.    Currently in Pain? No/denies                             [facility] Adult PT Treatment/Exercise - 04/06/20 1458      Transfers   Sit to Stand 4: Min guard    Sit to Stand Details Verbal cues for sequencing;Verbal cues for technique;Verbal cues for safe use of DME/AE    Sit to Stand Details (indicate cue type and reason) cues for proper hand placement    Stand to Sit 4: Min guard    Stand to Sit Details (indicate cue type and reason)  Verbal cues for sequencing;Verbal cues for precautions/safety;Verbal cues for safe use of DME/AE;Verbal cues for technique    Stand to Sit Details cues for hand placement and reaching back    Stand Pivot Transfers 4: Min assist    Stand Pivot Transfer Details (indicate cue type and reason) with RW, needs manual cues to turn RW prior to sitting down    Comments x5 reps staggered stance sit <> stands with LLE posteriorly, needs verbal and manual cues for feet to stay in proper placement      Ambulation/Gait   Ambulation/Gait Yes    Ambulation/Gait Assistance 5: Supervision    Ambulation/Gait Assistance Details mod cues for incr step length with RLE, facilitation needed with continuous RW motion with gait    Ambulation Distance (Feet) 30 Feet   x2   Assistive device Rolling walker    Gait Pattern Step-to pattern;Decreased step length - right;Decreased step length - left;Decreased dorsiflexion - left;Decreased dorsiflexion - right;Right foot flat;Left foot flat;Poor foot clearance - left;Poor foot clearance - right;Shuffle;Step-through pattern;Decreased stance time - right;Decreased stride length    Ambulation Surface Level;Indoor    Gait Comments 2 x 15' ambulating in and around cones for obstacle negotiation, cues for  step length and avoiding cues, needs occassional manual cues for navigating RW       Knee/Hip Exercises: Aerobic   Nustep with BLE and BUE for strengthening, ROM and activity tolerance, level 3.0 for 6 minutes                    PT Short Term Goals - 03/19/20 1534      PT SHORT TERM GOAL #1   Title Pt will be independent with initial HEP in order to build upon functional gains made in therapy. ALL STGS DUE 03/14/20    Baseline 03/19/20: met with current program with CNA assistance.    Time --    Period --    Status Achieved    Target Date --      PT SHORT TERM GOAL #2   Title Pt will undergo further assessment of TUG with STG and LTG to be written as appropriate.      Baseline 03/19/20: 163.53 sec's (2 minutes, 43.53 sec's) scored today as baseline, LTG to be set by primary PT    Time --    Period --    Status Achieved      PT SHORT TERM GOAL #3   Title Pt will decr 5x sit <> stand time with single UE support to 45 seconds or less in order to demo improved BLE strength.    Baseline 03/09/20: 53.79 seconds as baseline, not reassesed on 03/19/20 as it was just done session prior to this one    Status Not Met      PT SHORT TERM GOAL #4   Title Pt will undergo further assessment of BERG with LTG to be written as appropriate.    Baseline 03/19/20: 23/56 scored today as baseline, primary PT to set LTGs    Time --    Period --    Status Achieved      PT SHORT TERM GOAL #5   Title Pt will ambulate at least 13' with RW with supervision in order to improve household mobility.    Baseline 03/19/20: 62 feet with RW with min guard assist. improved from 20 feet, just not to goal    Time --    Period --    Status Not Met      PT SHORT TERM GOAL #6   Title Pt will perform stand pivot transfers and sit <> stands with RW with supervision in order to improve functional mobility.    Baseline 03/19/20: met in session today from RW to mat table    Time --    Period --    Status Achieved             PT Long Term Goals - 04/06/20 2011      PT LONG TERM GOAL #1   Title Pt will be independent with final HEP in order to build upon functional gains made in therapy. ALL LTGS DUE 04/16/20    Time 8    Period Weeks    Status New      PT LONG TERM GOAL #2   Title Pt will decrease TUG time to 2 minutes or less with RW in order to demo decr fall risk.    Baseline 2 minutes, 43.53 sec's    Time 8    Period Weeks    Status Revised      PT LONG TERM GOAL #3   Title Pt will improve BERG score to at least a 30/56 in order to  demo decr fall risk.    Baseline 23/56    Time 8    Period Weeks    Status Revised      PT LONG TERM GOAL #4   Title Pt will decr 5x sit <> stand  time to 35 seconds or less from mat table with UE support in order to demo improved functional BLE strength.    Baseline 53.79 seconds from mat table    Time 8    Period Weeks    Status Revised      PT LONG TERM GOAL #5   Title Pt will ambulate at least 230' with RW vs. LRAD with supervision in order to demo improved gait efficiency.    Time 8    Period Weeks    Status New      PT LONG TERM GOAL #6   Title Pt will perform all bed mobility with supervision in order to decr caregiver burden.    Time 8    Period Weeks    Status New                 Plan - 04/06/20 2009    Clinical Impression Statement Today's skilled session continued to focus on gait training and BLE strengthening. Pt fatigues easily with sit <> stands with LLE staggered posteriorly. Pt demonstrating decr foot clearance and scuffing with stand pivot transfers with RW, even with verbal cues for larger steps and picking up feet. Revised LTG date as week 8 of POC is next week.    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    Examination-Activity Limitations Bed Mobility;Bend;Locomotion Level;Stairs;Stand;Transfers;Squat    Examination-Participation Restrictions Community Activity    Stability/Clinical Decision Making Stable/Uncomplicated    Rehab Potential Good    PT Frequency 2x / week    PT Duration 8 weeks    PT Treatment/Interventions ADLs/Self Care Home Management;Gait training;DME Instruction;Stair training;Functional mobility training;Therapeutic activities;Neuromuscular re-education;Balance training;Therapeutic exercise;Patient/family education;Orthotic Fit/Training;Energy conservation;Passive range of motion    PT Next Visit Plan *10th visit PN. continue with nustep for stengthening/activity tolerance/working on increased speed with mobility, continue to work  on LE strengthening, gait and transfer training with RW,    Consulted and Agree with Plan of Care Patient;Family member/caregiver    Family Member Consulted CNA [place]           Patient will benefit from skilled therapeutic intervention in order to improve the following deficits and impairments:  Abnormal gait, Decreased balance, Decreased activity tolerance, Decreased coordination, Decreased endurance, Decreased knowledge of use of DME, Decreased range of motion, Difficulty walking, Decreased strength, Postural dysfunction  Visit Diagnosis: Other lack of coordination  Muscle weakness (generalized)  Unsteadiness on feet  Hemiplegia and hemiparesis following cerebral infarction affecting left non-dominant side (HCC)  Other symptoms and signs involving the nervous system     Problem List Patient Active Problem List   Diagnosis Date Noted  . Controlled type 2 diabetes mellitus with hyperglycemia, without long-term current use of insulin ([place]) 04/06/2020  . Abnormality of gait 02/03/2020  . Benign essential HTN   . CKD (chronic kidney disease), stage II   . Hemiparesis affecting left side as late effect of stroke ([place])   . Labile blood glucose   . Labile blood pressure   . Elevated BUN   . Hyponatremia   . Diabetic peripheral neuropathy ([place])   . Right  middle cerebral artery stroke ([place]) 01/14/2020  . Dyslipidemia   . Class 1 obesity due to excess calories with serious comorbidity and body mass index (BMI) of 30.0 to 30.9 in adult   . Left hemiparesis ([place])   . Controlled type 2 diabetes mellitus with hyperglycemia ([place])   . Morbid obesity ([place])   . Leukocytosis   . History of TIA (transient ischemic attack)   . Acute CVA (cerebrovascular accident) ([place]) 01/06/2020  . Acute ischemic stroke ([place]) 01/05/2020  . Hypertensive emergency 01/26/2017  . TIA (transient ischemic attack) 01/26/2017  . Hypothyroidism 01/26/2017  . Aphasia   . Essential hypertension   .  Diabetes mellitus without complication ([place])     Arliss Journey, PT, DPT 04/06/2020, 8:12 PM  [facility] [address] [place], [place], [address] Phone: [phone]   Fax:  [phone]  Name: JONETTE WASSEL MRN: [id number] Date of Birth: Jul 21, 1940

## 2020-04-06 NOTE — Therapy (Signed)
[facility] [address] [place], [place], [address] Phone: [phone]   Fax:  [phone]  Speech Language Pathology Treatment  Patient Details  Name: Jill Rose MRN: [id number] Date of Birth: 04/26/1941 Referring Provider (SLP): [name], MD   Encounter Date: 04/06/2020   End of Session - 04/06/20 1422    Visit Number 6    Number of Visits 17    Date for SLP Re-Evaluation 05/15/20    SLP Start Time 1318    SLP Stop Time  1400    SLP Time Calculation (min) 42 min    Activity Tolerance Patient tolerated treatment well           Past Medical History:  Diagnosis Date  . Diabetes mellitus without complication (HCC)   . Essential hypertension   . Hypothyroidism   . TIA (transient ischemic attack) 2018    No past surgical history on file.  There were no vitals filed for this visit.   Subjective Assessment - 04/06/20 1321    Subjective "She came here to see my... my..." (neurologist).    Currently in Pain? No/denies                 ADULT SLP TREATMENT - 04/06/20 1421      General Information   Behavior/Cognition Alert;Cooperative;Requires cueing;Distractible      Treatment Provided   Treatment provided Cognitive-Linquistic      Pain Assessment   Pain Assessment No/denies pain      Cognitive-Linquistic Treatment   Treatment focused on Cognition;Patient/family/caregiver education;Aphasia    Skilled Treatment Patient present for session with CNA, who reports pt assisted with cooking zucchini soup last week. Did not bring therapy book today. SLP reminded to bring to next session. Targeted sustained attention, error awareness by having pt write ingredients, tools, and begin sequencing steps to cooking zucchini soup. Pt required awareness for omitted ingredients for soup (a liquid- chicken broth, oil, noodles). When listing tools, pt required usual cues for awareness of errors  (mis-spellings, writing instructions instead of materials). Listed first 3 steps of instructions with usual mod A for error awareness. Pt to complete instructions for homework.       Assessment / Recommendations / Plan   Plan Continue with current plan of care      Progression Toward Goals   Progression toward goals Progressing toward goals            SLP Education - 04/06/20 1422    Education Details slow down; verbalize before writing    Person(s) Educated Patient;Caregiver(s)    Methods Explanation    Comprehension Verbalized understanding;Verbal cues required;Need further instruction            SLP Short Term Goals - 04/06/20 1417      SLP SHORT TERM GOAL #1   Title pt will *attempt to access* her memory system for simple organization of discipline-specific items, or for temporal orientation x5 sessions    Time 1    Period Weeks    Status On-going      SLP SHORT TERM GOAL #2   Title pt will demo knowledge of items in her memory system/notebook by correctly accessing them in 2 sessions    Time 1    Period Weeks    Status On-going      SLP SHORT TERM GOAL #3   Title pt will demonstrate error awareness in therapy tasks 75% with min cues for re-check in 3 sessions  Time 1    Period Weeks    Status On-going      SLP SHORT TERM GOAL #4   Title pt will name 8 items for simple categories x 3 sessions    Time 1    Period Weeks    Status On-going            SLP Long Term Goals - 04/06/20 1423      SLP LONG TERM GOAL #1   Title pt will demo correct usage of a memory compensation system for appointment and/or logging blood pressure and blood sugar, daily schedules in 3 sessions    Time 5    Period Weeks    Status On-going      SLP LONG TERM GOAL #2   Title Pt will maintain selective attention in functional task for 8 minutes in mod noisy environment with rare min cues x 2 sessions.    Time 5    Period Weeks    Status On-going      SLP LONG TERM GOAL #3    Title pt will demo anticipatory awareness regarding deficit areas, independently, in 3 sessions    Time 5    Period Weeks    Status On-going      SLP LONG TERM GOAL #4   Title pt will use compensations for anomia in 8 minutes simple conversation x3 visits    Time 5    Period Weeks    Status On-going            Plan - 04/06/20 1423    Clinical Impression Statement Mrs. Ruppel presents with overall moderate cognitive communication deficits. Pt would benefit from audiological eval. Suspect attention, primarily, and slow processing contributes to this difficulty. Prior to her CVA pt enjoyed cooking and reading about natural medicine/ ayurveda. I recommend skilled ST to address cognitive and language deficits for better communication with family and friends, to increase independence and improve quality of life.    Speech Therapy Frequency 2x / week    Duration --   8 weeks or 17 visits   Treatment/Interventions SLP instruction and feedback;Functional tasks;Cognitive reorganization;Compensatory strategies;Internal/external aids;Multimodal communcation approach;Patient/family education;Environmental controls;Language facilitation;Compensatory techniques    Potential to Achieve Goals Good    Consulted and Agree with Plan of Care Patient           Patient will benefit from skilled therapeutic intervention in order to improve the following deficits and impairments:   Cognitive communication deficit  Aphasia    Problem List Patient Active Problem List   Diagnosis Date Noted  . Controlled type 2 diabetes mellitus with hyperglycemia, without long-term current use of insulin (HCC) 04/06/2020  . Abnormality of gait 02/03/2020  . Benign essential HTN   . CKD (chronic kidney disease), stage II   . Hemiparesis affecting left side as late effect of stroke (HCC)   . Labile blood glucose   . Labile blood pressure   . Elevated BUN   . Hyponatremia   . Diabetic peripheral neuropathy (HCC)   .  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 Dyslipidemia   . Class 1 obesity due to excess calories with serious comorbidity and body mass index (BMI) of 30.0 to 30.9 in adult   . Left hemiparesis (HCC)   . Controlled type 2 diabetes mellitus with hyperglycemia (HCC)   . Morbid obesity (HCC)   . Leukocytosis   . History of TIA (transient ischemic attack)   . Acute CVA (cerebrovascular accident) (  HCC) 01/06/2020  . Acute ischemic stroke (HCC) 01/05/2020  . Hypertensive emergency 01/26/2017  . TIA (transient ischemic attack) 01/26/2017  . Hypothyroidism 01/26/2017  . Aphasia   . Essential hypertension   . Diabetes mellitus without complication [facility])    Rondel Baton, MS, CCC-SLP Speech-Language Pathologist  Arlana Lindau 04/06/2020, 2:23 PM  [facility] [address] [place], [place], [address] Phone: [phone]   Fax:  [phone]   Name: KATERIA CUTRONA MRN: [id number] Date of Birth: 1940-07-31

## 2020-04-06 NOTE — Progress Notes (Addendum)
Subjective:    Patient ID: Jill Rose, adult    DOB: [birth year], 79 y.o.   MRN: [id number]  HPI Right-handed adult with history of diet-controlled diabetes mellitus, hypertension, hypothyroidism, TIA presents for follow up for right MCA infarct.  Last clinic visit on 02/03/2020.  Daughter supplements history. Since that time, patient states she is still in therapies.  She sees Neuro this week. Her BP is slightly elevated, but unclear what it is at home.  Unclear if CBGs are controlled. She is using walker more. ? Fall getting up to go to the bathroom.  Pain Inventory Average Pain 0 Pain Right Now 0 My pain is no pain  In the last 24 hours, has pain interfered with the following? General activity 0 Relation with others 0 Enjoyment of life no pain What TIME of day is your pain at its worst? no pain Sleep (in general) Fair  Pain is worse with:  no pain Pain improves with:  no pain Relief from Meds:  no pain  Mobility walk with assistance use a walker use a wheelchair transfers alone  Function retired I need assistance with the following:  dressing, bathing, toileting, meal prep, household duties and shopping  Neuro/Psych bladder control problems weakness trouble walking depression anxiety  Prior Studies Any changes since last visit?  no  Physicians involved in your care Any changes since last visit?  no   Family History  Problem Relation Age of Onset  . Heart disease Mother    Social History   Socioeconomic History  . Marital status: Married    Spouse name: Not on file  . Number of children: Not on file  . Years of education: Not on file  . Highest education level: Not on file  Occupational History  . Not on file  Tobacco Use  . Smoking status: Never Smoker  . Smokeless tobacco: Never Used  Substance and Sexual Activity  . Alcohol use: No  . Drug use: No  . Sexual activity: Not on file  Other Topics Concern  . Not on file  Social History  Narrative  . Not on file   Social Determinants of Health   Financial Resource Strain:   . Difficulty of Paying Living Expenses: Not on file  Food Insecurity:   . Worried About Programme researcher, broadcasting/film/video in the Last Year: Not on file  . Ran Out of Food in the Last Year: Not on file  Transportation Needs:   . Lack of Transportation (Medical): Not on file  . Lack of Transportation (Non-Medical): Not on file  Physical Activity:   . Days of Exercise per Week: Not on file  . Minutes of Exercise per Session: Not on file  Stress:   . Feeling of Stress : Not on file  Social Connections:   . Frequency of Communication with Friends and Family: Not on file  . Frequency of Social Gatherings with Friends and Family: Not on file  . Attends Religious Services: Not on file  . Active Member of Clubs or Organizations: Not on file  . Attends Banker Meetings: Not on file  . Marital Status: Not on file   History reviewed. No pertinent surgical history. Past Medical History:  Diagnosis Date  . Diabetes mellitus without complication (HCC)   . Essential hypertension   . Hypothyroidism   . TIA (transient ischemic attack) 2018   BP (!) 147/79   Pulse 91   Temp 98 F (36.7 C)   Ht  5\' 1"  (1.549 m)   Wt 155 lb (70.3 kg)   SpO2 98%   BMI 29.29 kg/m   Opioid Risk Score:   Fall Risk Score:  `1  Depression screen PHQ 2/9  Depression screen PHQ 2/9 02/03/2020  Decreased Interest 0  Down, Depressed, Hopeless 2  PHQ - 2 Score 2  Altered sleeping 0  Tired, decreased energy 3  Change in appetite 0  Feeling bad or failure about yourself  0  Trouble concentrating 0  Moving slowly or fidgety/restless 1  Suicidal thoughts 0  PHQ-9 Score 6   Review of Systems  Constitutional: Negative.   HENT: Negative.   Eyes: Negative.   Respiratory: Negative.   Cardiovascular: Negative.   Gastrointestinal: Negative.   Endocrine: Negative.   Genitourinary: Positive for urgency.  Musculoskeletal:  Positive for gait problem.  Skin: Negative.   Allergic/Immunologic: Negative.   Hematological:       Plavix  Psychiatric/Behavioral: Positive for confusion and dysphoric mood.  All other systems reviewed and are negative.     Objective:   Physical Exam Constitutional: NAD. Vital signs reviewed. HENT: Normocephalic. Atraumatic. Eyes: EOMI. No discharge. Cardiovascular: No JVD.   Respiratory: Normal effort.  No stridor.  GI: Non-distended.   Skin: Warm and dry.  Intact. Psych: Flat.  Musc: No edema in extremities.  No tenderness in extremities. Neuro: Alert HOH Motor:  LUE: 4+/5 proximal to distal LLE: 4+/5 proximal to distal LUE: Mild ataxia, unchanged    Assessment & Plan:  Right-handed adult with history of diet-controlled diabetes mellitus, hypertension, hypothyroidism, TIA presents for follow up for right MCA infarct.  1. Left side hemiparesis secondary to right MCA infarction is felt to be thromboembolic secondary to small and large vessel disease  Continue therapies  Follow up with Neuro- appointment on 9/23  Plavix ordered until follow up with Neuro  2. Hypertension.    Continue meds  Encouraged ambulatory monitoring with increase in medications if persistently elevated  3. 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  Continue meds  Encouraged checking CBGs  4.  Gait abnormality  Continue therapies  Continue walker/wheelchiar for safety  5. Depression  Plans to follow up with PCP

## 2020-04-07 MED ORDER — CLOPIDOGREL BISULFATE 75 MG PO TABS
75.0000 mg | ORAL_TABLET | Freq: Every day | ORAL | 0 refills | Status: DC
Start: 2020-04-07 — End: 2020-04-12

## 2020-04-07 NOTE — Addendum Note (Signed)
Addended by: Maryla Morrow A on: 04/07/2020 10:33 AM   Modules accepted: Orders

## 2020-04-08 ENCOUNTER — Ambulatory Visit: Payer: Medicare HMO | Admitting: Neurology

## 2020-04-08 ENCOUNTER — Ambulatory Visit: Payer: Medicare HMO

## 2020-04-08 ENCOUNTER — Ambulatory Visit: Payer: Medicare HMO | Admitting: Physical Therapy

## 2020-04-08 ENCOUNTER — Other Ambulatory Visit: Payer: Self-pay

## 2020-04-08 VITALS — BP 153/70 | HR 90 | Ht 61.0 in | Wt 155.0 lb

## 2020-04-08 DIAGNOSIS — I6601 Occlusion and stenosis of right middle cerebral artery: Secondary | ICD-10-CM | POA: Diagnosis not present

## 2020-04-08 DIAGNOSIS — G3184 Mild cognitive impairment, so stated: Secondary | ICD-10-CM | POA: Diagnosis not present

## 2020-04-08 DIAGNOSIS — R413 Other amnesia: Secondary | ICD-10-CM | POA: Diagnosis not present

## 2020-04-08 NOTE — Patient Instructions (Signed)
I had a long d/w patient and her daughter about her recent stroke,memory loss, depression, risk for recurrent stroke/TIAs, personally independently reviewed imaging studies and stroke evaluation results and answered questions.Continue Plavix 75 mg daily for secondary stroke prevention and maintain strict control of hypertension with blood pressure goal below 130/90, diabetes with hemoglobin A1c goal below 6.5% and lipids with LDL cholesterol goal below 70 mg/dL. I also advised the patient to eat a healthy diet with plenty of whole grains, cereals, fruits and vegetables, exercise regularly and maintain ideal body weight. I recommend she start Lexapro for her depression and check lab work for reversible causes of memory loss and EEG and urinalysis. If cognitive deficits persist may consider trial of Aricept or Namenda upon follow-up visit. Continue ongoing physical occupational therapy. Followup in the future with me in 2 months or call earlier if necessary.  Stroke Prevention Some medical conditions and behaviors are associated with a higher chance of having a stroke. You can help prevent a stroke by making nutrition, lifestyle, and other changes, including managing any medical conditions you may have. What nutrition changes can be made?   Eat healthy foods. You can do this by: ? Choosing foods high in fiber, such as fresh fruits and vegetables and whole grains. ? Eating at least 5 or more servings of fruits and vegetables a day. Try to fill half of your plate at each meal with fruits and vegetables. ? Choosing lean protein foods, such as lean cuts of meat, poultry without skin, fish, tofu, beans, and nuts. ? Eating low-fat dairy products. ? Avoiding foods that are high in salt (sodium). This can help lower blood pressure. ? Avoiding foods that have saturated fat, trans fat, and cholesterol. This can help prevent high cholesterol. ? Avoiding processed and premade foods.  Follow your health care  provider's specific guidelines for losing weight, controlling high blood pressure (hypertension), lowering high cholesterol, and managing diabetes. These may include: ? Reducing your daily calorie intake. ? Limiting your daily sodium intake to 1,500 milligrams (mg). ? Using only healthy fats for cooking, such as olive oil, canola oil, or sunflower oil. ? Counting your daily carbohydrate intake. What lifestyle changes can be made?  Maintain a healthy weight. Talk to your health care provider about your ideal weight.  Get at least 30 minutes of moderate physical activity at least 5 days a week. Moderate activity includes brisk walking, biking, and swimming.  Do not use any products that contain nicotine or tobacco, such as cigarettes and e-cigarettes. If you need help quitting, ask your health care provider. It may also be helpful to avoid exposure to secondhand smoke.  Limit alcohol intake to no more than 1 drink a day for nonpregnant women and 2 drinks a day for men. One drink equals 12 oz of beer, 5 oz of wine, or 1 oz of hard liquor.  Stop any illegal drug use.  Avoid taking birth control pills. Talk to your health care provider about the risks of taking birth control pills if: ? You are over 42 years old. ? You smoke. ? You get migraines. ? You have ever had a blood clot. What other changes can be made?  Manage your cholesterol levels. ? Eating a healthy diet is important for preventing high cholesterol. If cholesterol cannot be managed through diet alone, you may also need to take medicines. ? Take any prescribed medicines to control your cholesterol as told by your health care provider.  Manage your diabetes. ?  Eating a healthy diet and exercising regularly are important parts of managing your blood sugar. If your blood sugar cannot be managed through diet and exercise, you may need to take medicines. ? Take any prescribed medicines to control your diabetes as told by your health  care provider.  Control your hypertension. ? To reduce your risk of stroke, try to keep your blood pressure below 130/80. ? Eating a healthy diet and exercising regularly are an important part of controlling your blood pressure. If your blood pressure cannot be managed through diet and exercise, you may need to take medicines. ? Take any prescribed medicines to control hypertension as told by your health care provider. ? Ask your health care provider if you should monitor your blood pressure at home. ? Have your blood pressure checked every year, even if your blood pressure is normal. Blood pressure increases with age and some medical conditions.  Get evaluated for sleep disorders (sleep apnea). Talk to your health care provider about getting a sleep evaluation if you snore a lot or have excessive sleepiness.  Take over-the-counter and prescription medicines only as told by your health care provider. Aspirin or blood thinners (antiplatelets or anticoagulants) may be recommended to reduce your risk of forming blood clots that can lead to stroke.  Make sure that any other medical conditions you have, such as atrial fibrillation or atherosclerosis, are managed. What are the warning signs of a stroke? The warning signs of a stroke can be easily remembered as BEFAST.  B is for balance. Signs include: ? Dizziness. ? Loss of balance or coordination. ? Sudden trouble walking.  E is for eyes. Signs include: ? A sudden change in vision. ? Trouble seeing.  F is for face. Signs include: ? Sudden weakness or numbness of the face. ? The face or eyelid drooping to one side.  A is for arms. Signs include: ? Sudden weakness or numbness of the arm, usually on one side of the body.  S is for speech. Signs include: ? Trouble speaking (aphasia). ? Trouble understanding.  T is for time. ? These symptoms may represent a serious problem that is an emergency. Do not wait to see if the symptoms will go  away. Get medical help right away. Call your local emergency services (911 in the [place] Do not drive yourself to the hospital.  Other signs of stroke may include: ? A sudden, severe headache with no known cause. ? Nausea or vomiting. ? Seizure. Where to find more information For more information, visit:  American Stroke Association: www.strokeassociation.org  National Stroke Association: www.stroke.org Summary  You can prevent a stroke by eating healthy, exercising, not smoking, limiting alcohol intake, and managing any medical conditions you may have.  Do not use any products that contain nicotine or tobacco, such as cigarettes and e-cigarettes. If you need help quitting, ask your health care provider. It may also be helpful to avoid exposure to secondhand smoke.  Remember BEFAST for warning signs of stroke. Get help right away if you or a loved one has any of these signs. This information is not intended to replace advice given to you by your health care provider. Make sure you discuss any questions you have with your health care provider. Document Revised: 06/15/2017 Document Reviewed: 08/08/2016 Elsevier Patient Education  2020 [employer].

## 2020-04-08 NOTE — Progress Notes (Signed)
[facility] [address] [place]. [place] [address] [phone]       OFFICE FOLLOW-UP NOTE  Jill Rose Date of Birth:  11/04/1940 Medical Record Number:  [id number]   HPI: Jill Rose is a 79 year old [place] origin lady who is seen for first office follow-up visit following hospital consultation for stroke.  She is accompanied by her daughter.  History is obtained from them, review of electronic medical records and I personally reviewed pertinent available imaging films in PACS.  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  She presented on 01/05/2020 to [facility] with left-sided weakness which started a week prior to presentation.  She was not able to tell initially whether this was due to her arthritis or a stroke.  She continued to have left-sided problems and getting in and out of bed in chair and also had some slurred speech eventually prompting hospital visit.  CT scan of the head showed no acute abnormality but old left basal ganglia lacunar infarct.  MRI scan confirmed acute right corona radiata and posterior right temporal and occipital white matter infarcts.  There is also old bilateral corona radiata, left thalamus and left paramedian pontine lacunar infarcts.  MRI of the brain showed multifocal intracranial atherosclerotic changes and MRA of the neck showed 25% right ICA and left vertebral artery atherosclerotic multifocal narrowing.  2D echo showed normal ejection fraction without cardiac source of embolism.  LDL cholesterol of 196 mg percent and hemoglobin A1c was 7.6.  Patient was started on dual antiplatelet therapy aspirin Plavix for 3 months and was transferred to inpatient rehab.  She is finished rehab stay is currently living at home.  She is doing outpatient physical occupational therapy.  She is able to walk with a walker.  She is shown some progressive  improvement and can get in and out of her couch in bed by herself.  She still has mild residual left-sided weakness but it is improving.  The family feels that the patient is depressed she gets frustrated easily and she feels she is very poor stamina.  He saw primary care physician last week and she was prescribed Lexapro but she has not yet started it.  She is living at home with her husband.  They have a CNA who comes in for few hours every day.  Patient's husband is also having cognitive issues and daughter is concerned about the long-term care.  Patient also notices increased forgetfulness and short-term memory difficulties.  She has not yet had any lab work for evaluation for reversible causes.  ROS:   14 system review of systems is positive for memory loss, depression, frustration, irritability, weakness, gait imbalance, ringing sound in the ears, discomfort all other systems negative PMH:  Past Medical History:  Diagnosis Date  . Diabetes mellitus without complication (HCC)   . Essential hypertension   . Hypothyroidism   . TIA (transient ischemic attack) 2018    Social History:  Social History   Socioeconomic History  . Marital status: Married    Spouse name: Not on file  . Number of children: Not on file  . Years of education: Not on file  . Highest education level: Not on file  Occupational History  . Not on file  Tobacco Use  . Smoking status: Never Smoker  . Smokeless tobacco: Never Used  Substance and Sexual Activity  . Alcohol use: No  .  Drug use: No  . Sexual activity: Not on file  Other Topics Concern  . Not on file  Social History Narrative  . Not on file   Social Determinants of Health   Financial Resource Strain:   . Difficulty of Paying Living Expenses: Not on file  Food Insecurity:   . Worried About Programme researcher, broadcasting/film/video in the Last Year: Not on file  . Ran Out of Food in the Last Year: Not on file  Transportation Needs:   . Lack of Transportation  (Medical): Not on file  . Lack of Transportation (Non-Medical): Not on file  Physical Activity:   . Days of Exercise per Week: Not on file  . Minutes of Exercise per Session: Not on file  Stress:   . Feeling of Stress : Not on file  Social Connections:   . Frequency of Communication with Friends and Family: Not on file  . Frequency of Social Gatherings with Friends and Family: Not on file  . Attends Religious Services: Not on file  . Active Member of Clubs or Organizations: Not on file  . Attends Banker Meetings: Not on file  . Marital Status: Not on file  Intimate Partner Violence:   . Fear of Current or Ex-Partner: Not on file  . Emotionally Abused: Not on file  . Physically Abused: Not on file  . Sexually Abused: Not on file    Medications:   Current Outpatient Medications on File Prior to Visit  Medication Sig Dispense Refill  . acetaminophen (TYLENOL) 325 MG tablet Take 2 tablets (650 mg total) by mouth every 4 (four) hours as needed for mild pain (or temp > 37.5 C (99.5 F)).    Marland Kitchen atorvastatin (LIPITOR) 80 MG tablet Take 1 tablet (80 mg total) by mouth daily. 90 tablet 0  . b complex vitamins tablet Take 1 tablet by mouth daily with lunch.    [name] Grape-Goldenseal (BERBERINE COMPLEX PO) Take 1 tablet by mouth daily with lunch.     . Cholecalciferol (VITAMIN D3 PO) Take 1 tablet by mouth daily with lunch.     . clopidogrel (PLAVIX) 75 MG tablet Take 1 tablet (75 mg total) by mouth daily. 7 tablet 0  . Cyanocobalamin (VITAMIN B-12 PO) Take 1 tablet by mouth daily with lunch.    . hydrALAZINE (APRESOLINE) 10 MG tablet Take 1 tablet (10 mg total) by mouth at bedtime. 30 tablet 0  . levothyroxine (SYNTHROID) 88 MCG tablet Take 1 tablet (88 mcg total) by mouth daily before breakfast. 30 tablet 0  . telmisartan-hydrochlorothiazide (MICARDIS HCT) 80-12.5 MG tablet Take 1 tablet by mouth daily.    Marland Kitchen amLODipine (NORVASC) 10 MG tablet Take 1 tablet (10 mg total)  by mouth daily. 30 tablet 0  . metFORMIN (GLUCOPHAGE) 500 MG tablet Take 1 tablet (500 mg total) by mouth 2 (two) times daily with a meal. 60 tablet 0   No current facility-administered medications on file prior to visit.    Allergies:   Allergies  Allergen Reactions  . Ammonia Shortness Of Breath, Itching and Swelling  . Peroxide [Hydrogen Peroxide] Shortness Of Breath, Itching and Swelling    Physical Exam General: well developed, well nourished elderly [place] lady, seated, in no evident distress Head: head normocephalic and atraumatic.  Neck: supple with no carotid or supraclavicular bruits Cardiovascular: regular rate and rhythm, no murmurs Musculoskeletal: no deformity Skin:  no rash/petichiae Vascular:  Normal pulses all extremities Vitals:   04/08/20 1115  BP: (!) 153/70  Pulse: 90   Neurologic Exam Mental Status: Awake and fully alert. Oriented to place and time. Recent and remote memory intact. Attention span, concentration and fund of knowledge appropriate. Mood and affect appropriate.  Cranial Nerves: Fundoscopic exam not done. Pupils equal, briskly reactive to light. Extraocular movements full without nystagmus. Visual fields full to confrontation. Hearing intact. Facial sensation intact. Face, tongue, palate moves normally and symmetrically.  Motor: Normal bulk and tone. Normal strength in all tested extremity muscles except mild left hemiparesis 4+/5 strength with weakness of left grip and intrinsic hand muscles.  Orbits right over left upper extremity.  Diminished fine finger movements and foot tapping on the left.. Sensory.: intact to touch ,pinprick .position and vibratory sensation.  Coordination: Mildly impaired finger-to-nose and needle coordination on the left. Gait and Station: Deferred as she did not bring her walker and she is in a wheelchair. Reflexes: 2+ and asymmetric and brisker on the left. Toes downgoing.   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 I also advised the patient to eat a healthy diet with plenty of whole grains, cereals, fruits and vegetables, exercise regularly and maintain ideal body weight. I recommend she start Lexapro for her depression and check lab work for reversible causes of memory loss and EEG and urinalysis. If cognitive deficits persist may consider trial of Aricept or Namenda upon follow-up visit. Continue ongoing physical occupational therapy. Followup in the future with me in 2 months or call earlier if necessary. Greater than 50% of time during this 25 minute visit was spent on counseling,explanation of diagnosis of stroke and memory loss, planning of further management, discussion with patient and family and coordination of care Delia Heady, MD  [facility] [address] [place], [place] [address]  Phone [phone] Fax [phone] Note: This document was prepared with digital dictation and possible smart phrase technology. Any transcriptional errors that result from this process are unintentional

## 2020-04-09 LAB — DEMENTIA PANEL
Homocysteine: 9.2 umol/L (ref 0.0–19.2)
RPR Ser Ql: NONREACTIVE
TSH: 13.9 u[IU]/mL — ABNORMAL HIGH (ref 0.450–4.500)
Vitamin B-12: >2000 pg/mL — ABNORMAL HIGH (ref 232–1245)

## 2020-04-12 ENCOUNTER — Other Ambulatory Visit: Payer: Self-pay | Admitting: *Deleted

## 2020-04-12 MED ORDER — CLOPIDOGREL BISULFATE 75 MG PO TABS
75.0000 mg | ORAL_TABLET | Freq: Every day | ORAL | 2 refills | Status: DC
Start: 2020-04-12 — End: 2021-06-21

## 2020-04-13 ENCOUNTER — Ambulatory Visit: Payer: Medicare HMO | Admitting: Physical Therapy

## 2020-04-13 ENCOUNTER — Ambulatory Visit: Payer: Medicare HMO

## 2020-04-13 ENCOUNTER — Ambulatory Visit: Payer: Medicare HMO | Admitting: Occupational Therapy

## 2020-04-15 ENCOUNTER — Ambulatory Visit: Payer: Medicare HMO | Admitting: Physical Therapy

## 2020-04-15 ENCOUNTER — Ambulatory Visit: Payer: Medicare HMO | Admitting: Occupational Therapy

## 2020-04-15 ENCOUNTER — Ambulatory Visit: Payer: Medicare HMO

## 2020-04-16 NOTE — Progress Notes (Signed)
Kindly inform the patient that vitamin B12, homocystine and test for syphilis were normal.  TSH is significantly elevated but improved from 3 years ago and she needs to see a primary care physician for treatment for this

## 2020-04-19 ENCOUNTER — Encounter: Payer: Self-pay | Admitting: *Deleted

## 2020-04-20 ENCOUNTER — Encounter (HOSPITAL_COMMUNITY): Payer: Self-pay

## 2020-04-20 ENCOUNTER — Ambulatory Visit: Payer: Medicare HMO | Admitting: Physical Therapy

## 2020-04-20 ENCOUNTER — Ambulatory Visit: Payer: Medicare HMO | Attending: Physician Assistant | Admitting: Occupational Therapy

## 2020-04-20 ENCOUNTER — Other Ambulatory Visit: Payer: Self-pay

## 2020-04-20 ENCOUNTER — Emergency Department (HOSPITAL_COMMUNITY)
Admission: EM | Admit: 2020-04-20 | Discharge: 2020-04-20 | Disposition: A | Discharge status: Home / Self Care | Payer: Medicare HMO | Attending: Emergency Medicine | Admitting: Emergency Medicine

## 2020-04-20 ENCOUNTER — Ambulatory Visit: Payer: Medicare HMO | Admitting: Speech Pathology

## 2020-04-20 VITALS — BP 230/100 | HR 100

## 2020-04-20 DIAGNOSIS — Z79899 Other long term (current) drug therapy: Secondary | ICD-10-CM | POA: Diagnosis not present

## 2020-04-20 DIAGNOSIS — E1165 Type 2 diabetes mellitus with hyperglycemia: Secondary | ICD-10-CM | POA: Diagnosis not present

## 2020-04-20 DIAGNOSIS — Z8673 Personal history of transient ischemic attack (TIA), and cerebral infarction without residual deficits: Secondary | ICD-10-CM | POA: Insufficient documentation

## 2020-04-20 DIAGNOSIS — N182 Chronic kidney disease, stage 2 (mild): Secondary | ICD-10-CM | POA: Diagnosis not present

## 2020-04-20 DIAGNOSIS — I129 Hypertensive chronic kidney disease with stage 1 through stage 4 chronic kidney disease, or unspecified chronic kidney disease: Secondary | ICD-10-CM | POA: Diagnosis not present

## 2020-04-20 DIAGNOSIS — E1140 Type 2 diabetes mellitus with diabetic neuropathy, unspecified: Secondary | ICD-10-CM | POA: Insufficient documentation

## 2020-04-20 DIAGNOSIS — R2681 Unsteadiness on feet: Secondary | ICD-10-CM | POA: Insufficient documentation

## 2020-04-20 DIAGNOSIS — I10 Essential (primary) hypertension: Secondary | ICD-10-CM

## 2020-04-20 DIAGNOSIS — Z7984 Long term (current) use of oral hypoglycemic drugs: Secondary | ICD-10-CM | POA: Insufficient documentation

## 2020-04-20 DIAGNOSIS — R278 Other lack of coordination: Secondary | ICD-10-CM | POA: Insufficient documentation

## 2020-04-20 DIAGNOSIS — R4182 Altered mental status, unspecified: Secondary | ICD-10-CM | POA: Insufficient documentation

## 2020-04-20 DIAGNOSIS — I69354 Hemiplegia and hemiparesis following cerebral infarction affecting left non-dominant side: Secondary | ICD-10-CM | POA: Insufficient documentation

## 2020-04-20 DIAGNOSIS — E039 Hypothyroidism, unspecified: Secondary | ICD-10-CM | POA: Insufficient documentation

## 2020-04-20 DIAGNOSIS — R41841 Cognitive communication deficit: Secondary | ICD-10-CM | POA: Insufficient documentation

## 2020-04-20 DIAGNOSIS — M6281 Muscle weakness (generalized): Secondary | ICD-10-CM | POA: Insufficient documentation

## 2020-04-20 DIAGNOSIS — R4701 Aphasia: Secondary | ICD-10-CM | POA: Insufficient documentation

## 2020-04-20 DIAGNOSIS — R2689 Other abnormalities of gait and mobility: Secondary | ICD-10-CM | POA: Insufficient documentation

## 2020-04-20 DIAGNOSIS — R29818 Other symptoms and signs involving the nervous system: Secondary | ICD-10-CM | POA: Insufficient documentation

## 2020-04-20 HISTORY — DX: Pure hypercholesterolemia, unspecified: E78.00

## 2020-04-20 HISTORY — DX: Cerebral infarction, unspecified: I63.9

## 2020-04-20 NOTE — ED Notes (Signed)
Patient verbalizes understanding of discharge instructions. Opportunity for questioning and answers were provided. Arm band removed by staff, patient discharged from ED. 

## 2020-04-20 NOTE — Discharge Instructions (Addendum)
The feeling off likely was either due to the stress of the event or the blood pressure being high.  You have now normalized her blood pressure doing better.  Follow-up with your doctors for further evaluation of this.

## 2020-04-20 NOTE — ED Provider Notes (Signed)
[facility] EMERGENCY DEPARTMENT Provider Note   CSN: [id number] Arrival date & time: 04/20/20  1610     History Chief Complaint  Patient presents with  . Hypertension  . Altered Mental Status    Jill Rose is a 79 y.o. adult.  HPI Patient brought in for mild confusion may be difficulty speaking a high blood pressure.  Patient was at PT for her previous stroke.  Has some left-sided deficits from that.  States that she has a new CNA that got her there late.  States she then had the CNA leaves with her things.  Patient states she did not want to do rehab.  States she was outside.  Patient states she feels fine now.  Reportedly had some difficulty with the date.  Reportedly reviewing notes may have had slurred speech that was a little worse than her baseline.  Patient feels fine at this point is actually eager to go home.    Past Medical History:  Diagnosis Date  . Diabetes mellitus without complication (HCC)   . Essential hypertension   . High cholesterol   . Hypothyroidism   . Stroke (HCC)   . TIA (transient ischemic attack) 2018    Patient Active Problem List   Diagnosis Date Noted  . Controlled type 2 diabetes mellitus with hyperglycemia, without long-term current use of insulin (HCC) 04/06/2020  . Abnormality of gait 02/03/2020  . Benign essential HTN   . CKD (chronic kidney disease), stage II   . Hemiparesis affecting left side as late effect of stroke (HCC)   . Labile blood glucose   . Labile blood pressure   . Elevated BUN   . Hyponatremia   . Diabetic peripheral neuropathy (HCC)   . 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 Dyslipidemia   . Class 1 obesity due to excess calories with serious comorbidity and body mass index (BMI) of 30.0 to 30.9 in adult   . Left hemiparesis (HCC)   . Controlled type 2 diabetes mellitus with hyperglycemia (HCC)   . Morbid obesity (HCC)   . Leukocytosis   . History of TIA (transient ischemic  attack)   . Acute CVA (cerebrovascular accident) (HCC) 01/06/2020  . Acute ischemic stroke (HCC) 01/05/2020  . Hypertensive emergency 01/26/2017  . TIA (transient ischemic attack) 01/26/2017  . Hypothyroidism 01/26/2017  . Aphasia   . Essential hypertension   . Diabetes mellitus without complication (HCC)     No past surgical history on file.   OB History   No obstetric history on file.     Family History  Problem Relation Age of Onset  . Heart disease Mother     Social History   Tobacco Use  . Smoking status: Never Smoker  . Smokeless tobacco: Never Used  Substance Use Topics  . Alcohol use: No  . Drug use: No    Home Medications Prior to Admission medications   Medication Sig Start Date End Date Taking? Authorizing Provider  acetaminophen (TYLENOL) 325 MG tablet Take 2 tablets (650 mg total) by mouth every 4 (four) hours as needed for mild pain (or temp > 37.5 C (99.5 F)). 01/28/20  Yes Angiulli, Mcarthur Rossetti, PA-C  amLODipine (NORVASC) 10 MG tablet Take 1 tablet (10 mg total) by mouth daily. 01/28/20 04/20/20 Yes Angiulli, Mcarthur Rossetti, PA-C  atorvastatin (LIPITOR) 80 MG tablet Take 1 tablet (80 mg total) by mouth daily. 01/28/20 04/27/20 Yes Angiulli, Mcarthur Rossetti, PA-C  b complex vitamins tablet  Take 1 tablet by mouth daily with lunch.   Yes [provider]  Barberry-Oreg Grape-Goldenseal (BERBERINE COMPLEX PO) Take 1 tablet by mouth daily with lunch.    Yes [provider]  Cholecalciferol (VITAMIN D3 PO) Take 1 tablet by mouth daily with lunch.    Yes [provider]  clopidogrel (PLAVIX) 75 MG tablet Take 1 tablet (75 mg total) by mouth daily. 04/12/20  Yes Micki Riley, MD  Cyanocobalamin (VITAMIN B-12 PO) Take 1 tablet by mouth daily with lunch.   Yes [provider]  escitalopram (LEXAPRO) 5 MG tablet Take 5 mg by mouth every morning. 04/06/20  Yes [provider]  hydrALAZINE (APRESOLINE) 10 MG tablet Take 1 tablet (10 mg total)  by mouth at bedtime. 01/28/20  Yes Angiulli, Mcarthur Rossetti, PA-C  levothyroxine (SYNTHROID) 88 MCG tablet Take 1 tablet (88 mcg total) by mouth daily before breakfast. 01/28/20  Yes Angiulli, Mcarthur Rossetti, PA-C  metFORMIN (GLUCOPHAGE) 500 MG tablet Take 1 tablet (500 mg total) by mouth 2 (two) times daily with a meal. 01/28/20 04/20/20 Yes Angiulli, Mcarthur Rossetti, PA-C  telmisartan-hydrochlorothiazide (MICARDIS HCT) 80-12.5 MG tablet Take 1 tablet by mouth daily.   Yes [provider]    Allergies    Ammonia and Peroxide [hydrogen peroxide]  Review of Systems   Review of Systems  Constitutional: Negative for appetite change.  Respiratory: Negative for shortness of breath.   Musculoskeletal: Negative for back pain.  Skin: Negative for pallor.  Neurological: Positive for speech difficulty.  Psychiatric/Behavioral: Positive for confusion.    Physical Exam Updated Vital Signs BP (!) 157/80   Pulse 89   Temp 97.8 F (36.6 C) (Oral)   Resp 17   Ht 5\' 1"  (1.549 m)   Wt 70.3 kg   SpO2 100%   BMI 29.29 kg/m   Physical Exam Vitals and nursing note reviewed.  HENT:     Head: Normocephalic.  Cardiovascular:     Rate and Rhythm: Regular rhythm.  Pulmonary:     Breath sounds: No wheezing or rhonchi.  Abdominal:     Tenderness: There is no abdominal tenderness.  Musculoskeletal:        General: No tenderness.     Cervical back: Neck supple.  Skin:    General: Skin is warm.  Neurological:     Mental Status: She is alert.     Comments: Some chronic left-sided weakness.  Unchanged per patient.  Clear and fluent speech.  Initially got the date wrong when she said 1921 but then quickly realized that it was 2021.     ED Results / Procedures / Treatments   Labs (all labs ordered are listed, but only abnormal results are displayed) Labs Reviewed - No data to display  EKG EKG Interpretation  Date/Time:  Tuesday April 20 2020 16:29:24 EDT Ventricular Rate:  88 PR Interval:    QRS  Duration: 83 QT Interval:  359 QTC Calculation: 435 R Axis:   -2 Text Interpretation: Sinus rhythm LVH by voltage Nonspecific T abnormalities, lateral leads Confirmed by 05[birth year]2003 [id number]) on 04/20/2020 4:43:47 PM   Radiology No results found.  Procedures Procedures (including critical care time)  Medications Ordered in ED Medications - No data to display  ED Course  I have reviewed the triage vital signs and the nursing notes.  Pertinent labs & imaging results that were available during my care of the patient were reviewed by me and considered in my medical decision making (see chart  for details).    MDM Rules/Calculators/A&P                          Patient presented with reported hypotension and altered mental status.  Much improved upon arrival.  Blood pressure improved and mental status back to baseline.  Patient has been stable during her time in the ER.  Discussed with patient's husband who came to see here.  States she is at her baseline.  He thinks she may have just gotten a little anxious and overwhelmed to everything that happened.  I feel as if a acute stroke or hypertensive urgency is less likely at this point, although if it was due to hypertension that is resolved.  Had extensive recent work-up for stroke.  I think this point she is stable for discharge and outpatient follow-up as needed. Final Clinical Impression(s) / ED Diagnoses Final diagnoses:  Hypertension, unspecified type    Rx / DC Orders ED Discharge Orders    None       Benjiman Core, MD 04/20/20 2243

## 2020-04-20 NOTE — ED Triage Notes (Signed)
Pt from neurologist office where she was getting PT for previous stroke ( L sided deficits); while MD asking pt questions, pt exhibiting intermittent confusion and delay in answering questions; pt hypertensive, BP 230/100; pt states she "feels fine", denies pain; unsure of LKW; pt a and o x 4 on arrival to ED  CBG 155 P 78 98% rA

## 2020-04-20 NOTE — Therapy (Signed)
[facility] [address] [place], [place], [address] Phone: [phone]   Fax:  [phone]  Physical Therapy Treatment-Arrived No Charge   Patient Details  Name: Jill Rose MRN: [id number] Date of Birth: 1941/04/02 Referring Provider (PT): Fortino Sic, [place] (being followed by Dr. Posey Pronto)   Encounter Date: 04/20/2020   PT End of Session - 04/20/20 1555    Visit Number 9   arrived no charge   Number of Visits 17    Date for PT Re-Evaluation 05/13/20   written for 60 day POC   Authorization Type [employer] Medicare- $35 co pay per day regardless of discipline    PT Start Time 1445    PT Stop Time 1530    PT Time Calculation (min) 45 min           Past Medical History:  Diagnosis Date  . Diabetes mellitus without complication ([place])   . Essential hypertension   . Hypothyroidism   . TIA (transient ischemic attack) 2018    No past surgical history on file.  Vitals:   04/20/20 1503 04/20/20 1517 04/20/20 1526  BP: (!) 202/102  (!) 230/100  Pulse:  100   SpO2:  98%      Subjective Assessment - 04/20/20 1452    Subjective Got a new nurse - she got lost coming here and missed her OT session. Pt states her mind is not ready and would not like to participate in therapy today.    Patient is accompained by: Family member   CNA   Pertinent History diabetes mellitus, HTN, hypothyroidism, TIA (2018)    Patient Stated Goals wants to be walking more.    Currently in Pain? No/denies                     Pt arrived to PT session - states that she got a new CNA who got lost coming here. Pt states that she is not in the right headspace to participate in therapy and would like to leave. Wheeled pt out to the parking lot to look for CNA (as CNA did not come into session with pt) and CNA was not in parking lot. Pt then agreeable to participate in PT - although she is feeling more tired today. Therapist noticed  some slurring of speech and pt with difficulty with word finding (to therapist, appeared different from baseline). Assessed pt's BP at 202/102. Pt reports that she took her BP medication today and that she notices speaking is worse than normal today. Discussed with pt about needing to call 911 due to high BP and difficulties speaking due to risk of a possible CVA, with pt in agreement. Later pt then stated that she felt like her normal self. EMS arrived and checked pt's BP at 230/100, when asking pt questions, pt able to answer to self, but unable to state what year it was. Pt left in ambulance. Therapist attempted to call pt's husband with no answer and called pt's daughter to make her aware.                   PT Short Term Goals - 03/19/20 1534      PT SHORT TERM GOAL #1   Title Pt will be independent with initial HEP in order to build upon functional gains made in therapy. ALL STGS DUE 03/14/20    Baseline 03/19/20: met with current program with CNA assistance.    Time --  Period --    Status Achieved    Target Date --      PT SHORT TERM GOAL #2   Title Pt will undergo further assessment of TUG with STG and LTG to be written as appropriate.    Baseline 03/19/20: 163.53 sec's (2 minutes, 43.53 sec's) scored today as baseline, LTG to be set by primary PT    Time --    Period --    Status Achieved      PT SHORT TERM GOAL #3   Title Pt will decr 5x sit <> stand time with single UE support to 45 seconds or less in order to demo improved BLE strength.    Baseline 03/09/20: 53.79 seconds as baseline, not reassesed on 03/19/20 as it was just done session prior to this one    Status Not Met      PT SHORT TERM GOAL #4   Title Pt will undergo further assessment of BERG with LTG to be written as appropriate.    Baseline 03/19/20: 23/56 scored today as baseline, primary PT to set LTGs    Time --    Period --    Status Achieved      PT SHORT TERM GOAL #5   Title Pt will ambulate at least  20' with RW with supervision in order to improve household mobility.    Baseline 03/19/20: 62 feet with RW with min guard assist. improved from 20 feet, just not to goal    Time --    Period --    Status Not Met      PT SHORT TERM GOAL #6   Title Pt will perform stand pivot transfers and sit <> stands with RW with supervision in order to improve functional mobility.    Baseline 03/19/20: met in session today from RW to mat table    Time --    Period --    Status Achieved             PT Long Term Goals - 04/06/20 2011      PT LONG TERM GOAL #1   Title Pt will be independent with final HEP in order to build upon functional gains made in therapy. ALL LTGS DUE 04/16/20    Time 8    Period Weeks    Status New      PT LONG TERM GOAL #2   Title Pt will decrease TUG time to 2 minutes or less with RW in order to demo decr fall risk.    Baseline 2 minutes, 43.53 sec's    Time 8    Period Weeks    Status Revised      PT LONG TERM GOAL #3   Title Pt will improve BERG score to at least a 30/56 in order to demo decr fall risk.    Baseline 23/56    Time 8    Period Weeks    Status Revised      PT LONG TERM GOAL #4   Title Pt will decr 5x sit <> stand time to 35 seconds or less from mat table with UE support in order to demo improved functional BLE strength.    Baseline 53.79 seconds from mat table    Time 8    Period Weeks    Status Revised      PT LONG TERM GOAL #5   Title Pt will ambulate at least 230' with RW vs. LRAD with supervision in order to demo improved gait efficiency.  Time 8    Period Weeks    Status New      PT LONG TERM GOAL #6   Title Pt will perform all bed mobility with supervision in order to decr caregiver burden.    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    Examination-Activity Limitations Bed Mobility;Bend;Locomotion Level;Stairs;Stand;Transfers;Squat    Examination-Participation Restrictions Community Activity    Stability/Clinical Decision Making Stable/Uncomplicated    Rehab Potential Good    PT Frequency 2x / week    PT Duration 8 weeks    PT Treatment/Interventions ADLs/Self Care Home Management;Gait training;DME Instruction;Stair training;Functional mobility training;Therapeutic activities;Neuromuscular re-education;Balance training;Therapeutic exercise;Patient/family education;Orthotic Fit/Training;Energy conservation;Passive range of motion    PT Next Visit Plan *10th visit PN - will need to check goals and do re-cert as this is week 8. how is pt doing? continue with nustep for stengthening/activity tolerance/working on increased speed with mobility, continue to work on LE strengthening, gait and transfer training with RW,    Consulted and Agree with Plan of Care Patient;Family member/caregiver    Family Member Consulted CNA Charlotted           Patient will benefit from skilled therapeutic intervention in order to improve the following deficits and impairments:  Abnormal gait, Decreased balance, Decreased activity tolerance, Decreased coordination, Decreased endurance, Decreased knowledge of use of DME, Decreased range of motion, Difficulty walking, Decreased strength, Postural dysfunction  Visit Diagnosis: Muscle weakness (generalized)     Problem List Patient Active Problem List   Diagnosis Date Noted  . Controlled type 2 diabetes mellitus with hyperglycemia, without long-term current use of insulin ([place]) 04/06/2020  . Abnormality of gait 02/03/2020  . Benign essential HTN   . CKD (chronic kidney disease), stage II   . Hemiparesis affecting left side as late effect of stroke ([place])   . Labile blood glucose   . Labile blood pressure   . Elevated BUN   . Hyponatremia   . Diabetic  peripheral neuropathy ([place])   . 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 Dyslipidemia   . Class 1 obesity due to excess calories with serious comorbidity and body mass index (BMI) of 30.0 to 30.9 in adult   . Left hemiparesis ([place])   . Controlled type 2 diabetes mellitus with hyperglycemia ([place])   . Morbid obesity ([place])   . Leukocytosis   . History of TIA (transient ischemic attack)   . Acute CVA (cerebrovascular accident) ([place]) 01/06/2020  . Acute ischemic stroke ([place]) 01/05/2020  . Hypertensive emergency 01/26/2017  . TIA (transient ischemic attack) 01/26/2017  . Hypothyroidism 01/26/2017  . Aphasia   . Essential hypertension   . Diabetes mellitus without complication ([place])     Arliss Journey, PT, DPT  04/20/2020, 4:04 PM  [facility] [address] [place], [place], [address] Phone: [phone]   Fax:  [phone]  Name: Jill Rose MRN: [id number] Date of Birth: [birth year]

## 2020-04-21 ENCOUNTER — Other Ambulatory Visit: Payer: Medicare HMO | Admitting: Neurology

## 2020-04-21 ENCOUNTER — Other Ambulatory Visit: Payer: Medicare HMO

## 2020-04-21 DIAGNOSIS — R410 Disorientation, unspecified: Secondary | ICD-10-CM

## 2020-04-22 ENCOUNTER — Ambulatory Visit: Payer: Medicare HMO | Admitting: Physical Therapy

## 2020-04-22 ENCOUNTER — Other Ambulatory Visit: Payer: Self-pay

## 2020-04-22 ENCOUNTER — Encounter: Payer: Self-pay | Admitting: Physical Therapy

## 2020-04-22 ENCOUNTER — Ambulatory Visit: Payer: Medicare HMO | Admitting: Speech Pathology

## 2020-04-22 ENCOUNTER — Encounter: Payer: Self-pay | Admitting: Occupational Therapy

## 2020-04-22 ENCOUNTER — Ambulatory Visit: Payer: Medicare HMO | Admitting: Occupational Therapy

## 2020-04-22 VITALS — BP 164/76 | HR 93

## 2020-04-22 VITALS — BP 162/90 | HR 96

## 2020-04-22 DIAGNOSIS — R29818 Other symptoms and signs involving the nervous system: Secondary | ICD-10-CM | POA: Diagnosis present

## 2020-04-22 DIAGNOSIS — I69354 Hemiplegia and hemiparesis following cerebral infarction affecting left non-dominant side: Secondary | ICD-10-CM

## 2020-04-22 DIAGNOSIS — M6281 Muscle weakness (generalized): Secondary | ICD-10-CM

## 2020-04-22 DIAGNOSIS — R278 Other lack of coordination: Secondary | ICD-10-CM | POA: Diagnosis present

## 2020-04-22 DIAGNOSIS — R2681 Unsteadiness on feet: Secondary | ICD-10-CM | POA: Diagnosis present

## 2020-04-22 DIAGNOSIS — R2689 Other abnormalities of gait and mobility: Secondary | ICD-10-CM | POA: Diagnosis present

## 2020-04-22 DIAGNOSIS — R4701 Aphasia: Secondary | ICD-10-CM

## 2020-04-22 DIAGNOSIS — R41841 Cognitive communication deficit: Secondary | ICD-10-CM | POA: Diagnosis present

## 2020-04-22 NOTE — Therapy (Signed)
[facility] [address] [place], [place], [address] Phone: [phone]   Fax:  [phone]  Occupational Therapy Treatment  Patient Details  Name: Jill Rose MRN: [id number] Date of Birth: May 17, 1941 No data recorded  Encounter Date: 04/22/2020   OT End of Session - 04/22/20 1510    Visit Number 6    Number of Visits 17    Date for OT Re-Evaluation 05/16/20    Authorization Type [employer] MCR    Authorization - Visit Number 6    Authorization - Number of Visits 10    Progress Note Due on Visit 10    OT Start Time 1405    OT Stop Time 1445    OT Time Calculation (min) 40 min    Activity Tolerance Patient tolerated treatment well    Behavior During Therapy [facility] for tasks assessed/performed           Past Medical History:  Diagnosis Date  . Diabetes mellitus without complication (H[facility])   . Essential hypertension   . High cholesterol   . Hypothyroidism   . Stroke ([facility])   . TIA (transient ischemic attack) 2018    History reviewed. No pertinent surgical history.  Vitals:   04/22/20 1412 04/22/20 1444  BP: (!) 158/84 (!) 164/76  Pulse:  93     Subjective Assessment - 04/22/20 1504    Subjective  These last two days have been scary    Pertinent History Rt MCA CVA 01/05/20. PMH: HTN, HLD, T2DM, Diabetic neuropathy    Limitations no driving, fall risk, HOH    Currently in Pain? No/denies    Pain Score 0-No pain                        OT Treatments/Exercises (OP) - 04/22/20 0001      Neurological Re-education Exercises   Other Exercises 1 Neuromuscular reeducation to address postural control and transitional movements - sit to stand, stand to sit.  Patient initially very surface dependent - reliant on upper extrremities to pull up to standing.  Worked to narrow base of support and increase weight shift forward during this transition.  Patient able to stand with hands on knees, with min cueing to  ensure adequate forward flexion.  In standing patient needed facilitation to finalize hip/knee/trunk extension.  Worked on upright posture first statically, then dynamically in very small ranges.  Patient showing difficulty with weight shift to right side, or off right side to step adjust left leg.  Patient did well with brief activity then rest, and much encouragement.  Patient very anxious.                      OT Short Term Goals - 04/22/20 1514      OT SHORT TERM GOAL #1   Title Independent with coordination HEP Lt hand    Time 4    Period Weeks    Status On-going   Issued coordination HEP 03/19/20     OT SHORT TERM GOAL #2   Title Pt to perform UE dressing with min assist or less and LE dressing with mod assist or less    Time 4    Period Weeks    Status On-going   Pt reports doing approximately 50% of UB dressing 03/19/20     OT SHORT TERM GOAL #3   Title Pt to perform simple snack prep/cold meal prep standing level with DME  and supervision prn    Time 4    Period Weeks    Status On-going   Pt reports assisting with cutting vegetables with husband 03/19/20 - seated.     OT SHORT TERM GOAL #4   Title Pt to perform sustained high level reaching LUE for endurance    Time 4    Period Weeks    Status New      OT SHORT TERM GOAL #5   Title Pt to return to putting up/styling hair mod I level    Time 4    Period Weeks    Status On-going   Pt reports needing assistance for clasps and barettes            OT Long Term Goals - 04/22/20 1514      OT LONG TERM GOAL #1   Title Independent with strengthening HEP for LUE    Time 8    Period Weeks    Status New      OT LONG TERM GOAL #2   Title Improve coordination Lt hand as evidenced by performing 9 hole peg test in 41 sec or less    Baseline 48.59 sec    Time 8    Period Weeks    Status New      OT LONG TERM GOAL #3   Title Pt to be mod I level with all BADLS    Time 8    Period Weeks    Status New      OT  LONG TERM GOAL #4   Title Pt to cook simple meal with DME prn and distant supervision    Time 8    Period Weeks    Status New      OT LONG TERM GOAL #5   Title Pt to perform light cleaning tasks (washing dishes, folding laundry, etc) mod I level    Time 8    Period Weeks    Status New                 Plan - 04/22/20 1510    Clinical Impression Statement Patient left via ambulance earlier this week due to significantly elevated BP.  Patient anxious today, and worried she is working too hard in therapy.  Reassured her that we would work to her tolerance.  BP monitored, and although elevated, not outside safe range for OT treatment.  Patient surface dependent with poor integration of R/L sides, and fear of forward weight translation.    OT Occupational Profile and History Detailed Assessment- Review of Records and additional review of physical, cognitive, psychosocial history related to current functional performance    Occupational performance deficits (Please refer to evaluation for details): ADL's;IADL's    Body Structure / Function / Physical Skills ADL;ROM;Balance;Body mechanics;Mobility;Flexibility;Strength;Coordination;FMC;UE functional use;Decreased knowledge of use of DME    Rehab Potential Good    Clinical Decision Making Several treatment options, min-mod task modification necessary    Comorbidities Affecting Occupational Performance: May have comorbidities impacting occupational performance    Modification or Assistance to Complete Evaluation  Min-Moderate modification of tasks or assist with assess necessary to complete eval    OT Frequency 2x / week    OT Duration 8 weeks   Plus eval   OT Treatment/Interventions Self-care/ADL training;Therapeutic exercise;Functional Mobility Training;Neuromuscular education;Manual Therapy;Therapeutic activities;Coping strategies training;DME and/or AE instruction;Cognitive remediation/compensation;Moist Heat;Passive range of  motion;Patient/family education    Plan postural control, stand balance static to dynamic, transitional movement, LUE NMR  Consulted and Agree with Plan of Care Patient;Family member/caregiver    Family Member Consulted caregiver CNA           Patient will benefit from skilled therapeutic intervention in order to improve the following deficits and impairments:   Body Structure / Function / Physical Skills: ADL, ROM, Balance, Body mechanics, Mobility, Flexibility, Strength, Coordination, FMC, UE functional use, Decreased knowledge of use of DME       Visit Diagnosis: Hemiplegia and hemiparesis following cerebral infarction affecting left non-dominant side (HCC)  Unsteadiness on feet  Other symptoms and signs involving the nervous system  Other lack of coordination  Muscle weakness (generalized)    Problem List Patient Active Problem List   Diagnosis Date Noted  . Controlled type 2 diabetes mellitus with hyperglycemia, without long-term current use of insulin (HCC) 04/06/2020  . Abnormality of gait 02/03/2020  . Benign essential HTN   . CKD (chronic kidney disease), stage II   . Hemiparesis affecting left side as late effect of stroke (HCC)   . Labile blood glucose   . Labile blood pressure   . Elevated BUN   . Hyponatremia   . Diabetic peripheral neuropathy (HCC)   . 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 Dyslipidemia   . Class 1 obesity due to excess calories with serious comorbidity and body mass index (BMI) of 30.0 to 30.9 in adult   . Left hemiparesis (HCC)   . Controlled type 2 diabetes mellitus with hyperglycemia (HCC)   . Morbid obesity (HCC)   . Leukocytosis   . History of TIA (transient ischemic attack)   . Acute CVA (cerebrovascular accident) (HCC) 01/06/2020  . Acute ischemic stroke (HCC) 01/05/2020  . Hypertensive emergency 01/26/2017  . TIA (transient ischemic attack) 01/26/2017  . Hypothyroidism 01/26/2017  . Aphasia   . Essential  hypertension   . Diabetes mellitus without complication [facility])     Collier Salina, OTR/L 04/22/2020, 3:15 PM  [facility] [address] [place], [place], [address] Phone: [phone]   Fax:  [phone]  Name: Jill Rose MRN: [id number] Date of Birth: [birth year]

## 2020-04-22 NOTE — Patient Instructions (Signed)
Put your schedule in a binder with your therapy calendar. Make sections for OT, PT and Speech. Keep your therapy materials and exercises in the binder.  Every morning, look at your schedule. Cross off each activity as you complete it.

## 2020-04-23 ENCOUNTER — Telehealth: Payer: Self-pay | Admitting: Speech Pathology

## 2020-04-23 NOTE — Telephone Encounter (Signed)
SLP called pt's daughter Allayne Gitelman, confirmed 2 family members are following up each day to ensure pt's meds are being taken correctly. We discussed use of binder to organize therapy activities and daily schedule; daughter will relay this information to brother to help set this up and hopefully improve continuity/carryover from session to session. She is going to request consistent caregiver from agency as well.  Rondel Baton, MS, Sports administrator

## 2020-04-23 NOTE — Therapy (Signed)
[facility] [address] [place], [place], [address] Phone: [phone]   Fax:  [phone]  Speech Language Pathology Treatment  Patient Details  Name: Jill Rose MRN: [id number] Date of Birth: [birth year] Referring Provider (SLP): Jamse Arn, MD   Encounter Date: 04/22/2020   End of Session - 04/23/20 0741    Visit Number 7    Number of Visits 17    Date for SLP Re-Evaluation 05/15/20    SLP Start Time 1535    SLP Stop Time  4166    SLP Time Calculation (min) 40 min    Activity Tolerance Patient tolerated treatment well           Past Medical History:  Diagnosis Date  . Diabetes mellitus without complication ([place])   . Essential hypertension   . High cholesterol   . Hypothyroidism   . Stroke ([place])   . TIA (transient ischemic attack) 2018    No past surgical history on file.  There were no vitals filed for this visit.   Subjective Assessment - 04/22/20 1540    Subjective "Blood pressure is a little high."    Currently in Pain? No/denies                 ADULT SLP TREATMENT - 04/23/20 0732      General Information   Behavior/Cognition Alert;Cooperative;Requires cueing;Distractible      Treatment Provided   Treatment provided Cognitive-Linquistic      Pain Assessment   Pain Assessment No/denies pain      Cognitive-Linquistic Treatment   Treatment focused on Cognition;Patient/family/caregiver education;Aphasia    Skilled Treatment Pt's husband and new caregiver present for session today. Patient went to ED on Tuesday for high blood pressure; PT took prior to SLP session and BP is high today, as well. Appears caregiver may have some concerns about pt getting medications consistently. Per Dr. Clydene Fake note, pt's daughter mentioned pt's spouse having cognitive issues as well, and he is administering her medications. Patient states she has not been completing any exercises at home (for any  discipline). We discussed how sticking to a regular schedule may help her with motivation, recall and consistency. Discussed setting alarm so pt can remind husband when it's time for medications. Husband wished to set this alarm on his phone, as pt's phone is usually not charged; recurring alert for AM and PM meds set. She did not bring a binder or any materials to therapy today. Cognitive deficits and inconsistency with caregivers limiting carryover at home. Today with caregiver, pt and husband collaboration, we drafted a simple daily schedule including meals, medications, exercises and therapy. Caregiver to assist putting together a binder for patient, and using schedule with pt daily.       Assessment / Recommendations / Plan   Plan Continue with current plan of care      Progression Toward Goals   Progression toward goals Not progressing toward goals (comment)   consistent caregiver involvement necessary for carryover             SLP Short Term Goals - 04/23/20 0745      SLP SHORT TERM GOAL #1   Title pt will *attempt to access* her memory system for simple organization of discipline-specific items, or for temporal orientation x5 sessions    Time 1    Period Weeks    Status Not Met      SLP SHORT TERM GOAL #2   Title pt will  demo knowledge of items in her memory system/notebook by correctly accessing them in 2 sessions    Time 1    Period Weeks    Status Not Met      SLP SHORT TERM GOAL #3   Title pt will demonstrate error awareness in therapy tasks 75% with min cues for re-check in 3 sessions    Time 1    Period Weeks    Status Not Met      SLP SHORT TERM GOAL #4   Title pt will name 8 items for simple categories x 3 sessions    Time 1    Period Weeks    Status Not Met            SLP Long Term Goals - 04/23/20 0745      SLP LONG TERM GOAL #1   Title pt will demo correct usage of a memory compensation system for appointment and/or logging blood pressure and blood  sugar, daily schedules in 3 sessions    Time 4    Period Weeks    Status On-going      SLP LONG TERM GOAL #2   Title Pt will maintain selective attention in functional task for 8 minutes in mod noisy environment with rare min cues x 2 sessions.    Time 4    Period Weeks    Status On-going      SLP LONG TERM GOAL #3   Title pt will demo anticipatory awareness regarding deficit areas, independently, in 3 sessions    Time 4    Period Weeks    Status On-going      SLP LONG TERM GOAL #4   Title pt will use compensations for anomia in 8 minutes simple conversation x3 visits    Time 4    Period Weeks    Status On-going            Plan - 04/23/20 0741    Clinical Impression Statement Jill Rose presents with overall moderate cognitive communication deficits. Pt would benefit from audiological eval. Attention, recall, and awareness are among cognitive impairments, which also impact higher level functions. Did not meet any STGs; will monitor and likely downgrade LTGs. Pt missed 2 weeks, cancelling due to "not feeling well," had ED visit on Tuesday for high blood pressure. Reinforced to pt and caregivers importance of compliance with medications to prevent stroke recurrence. Regular presence of caregiver in session necessary for carryover of therapy activities; new caregiver present today who appears willing to assist patient. Prior to her CVA pt enjoyed cooking and reading about natural medicine/ ayurveda. I recommend skilled ST to address cognitive and language deficits for better communication with family and friends, to increase independence and improve quality of life.    Speech Therapy Frequency 2x / week    Duration --   8 weeks or 17 visits   Treatment/Interventions SLP instruction and feedback;Functional tasks;Cognitive reorganization;Compensatory strategies;Internal/external aids;Multimodal communcation approach;Patient/family education;Environmental controls;Language  facilitation;Compensatory techniques    Potential to Achieve Goals Good    Consulted and Agree with Plan of Care Patient           Patient will benefit from skilled therapeutic intervention in order to improve the following deficits and impairments:   Cognitive communication deficit  Aphasia    Problem List Patient Active Problem List   Diagnosis Date Noted  . Controlled type 2 diabetes mellitus with hyperglycemia, without long-term current use of insulin ([facility]) 04/06/2020  . Abnormality of gait  02/03/2020  . Benign essential HTN   . CKD (chronic kidney disease), stage II   . Hemiparesis affecting left side as late effect of stroke ([facility])   . Labile blood glucose   . Labile blood pressure   . Elevated BUN   . Hyponatremia   . Diabetic peripheral neuropathy ([place])   . 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 Dyslipidemia   . Class 1 obesity due to excess calories with serious comorbidity and body mass index (BMI) of 30.0 to 30.9 in adult   . Left hemiparesis ([facility])   . Controlled type 2 diabetes mellitus with hyperglycemia ([place])   . Morbid obesity ([place])   . Leukocytosis   . History of TIA (transient ischemic attack)   . Acute CVA (cerebrovascular accident) ([facility]) 01/06/2020  . Acute ischemic stroke ([place]) 01/05/2020  . Hypertensive emergency 01/26/2017  . TIA (transient ischemic attack) 01/26/2017  . Hypothyroidism 01/26/2017  . Aphasia   . Essential hypertension   . Diabetes mellitus without complication [facility])    Deneise Lever, [place], [place] Speech-Language Pathologist  Aliene Altes 04/23/2020, 7:47 AM  [facility] [address] [place], [place], [address] Phone: [phone]   Fax:  [phone]   Name: AMBERLE LYTER MRN: [id number] Date of Birth: 1940-10-14

## 2020-04-23 NOTE — Therapy (Addendum)
[facility] [address] [place], [place], [address] Phone: [phone]   Fax:  [phone]  Physical Therapy Treatment/Re-Cert/10th Visit Progress Note  Patient Details  Name: Jill Rose MRN: [id number] Date of Birth: 1941/03/19 Referring Provider (PT): Alphonzo Severance [place], [place] (being followed by Dr. Posey Pronto)  10th Visit Physical Therapy Progress Note  Dates of Reporting Period: 02/13/20 to 04/22/20    Encounter Date: 04/22/2020     04/22/20 1450  PT Visits / Re-Eval  Visit Number 10  Number of Visits 18  Date for PT Re-Evaluation 06/25/20 (written for 4 week POC)  Authorization  Authorization Type [employer]- $35 co pay per day regardless of discipline  PT Time Calculation  PT Start Time 1448  PT Stop Time 1535  PT Time Calculation (min) 47 min  PT - End of Session  Equipment Utilized During Treatment Gait belt  Activity Tolerance Patient tolerated treatment well;Patient limited by fatigue  Behavior During Therapy [facility] for tasks assessed/performed    Past Medical History:  Diagnosis Date  . Diabetes mellitus without complication ([facility])   . Essential hypertension   . High cholesterol   . Hypothyroidism   . Stroke ([place])   . TIA (transient ischemic attack) 2018    History reviewed. No pertinent surgical history.  Vitals:   04/22/20 1449 04/22/20 1514 04/22/20 1530  BP: (!) 164/76 (!) 181/91 (!) 162/90  Pulse: 93  96     Subjective Assessment - 04/22/20 1449    Subjective No new complaints. BP was stable with OT session prior to PT session starting.    Patient is accompained by: Family member   spouse, CNA   Pertinent History diabetes mellitus, HTN, hypothyroidism, TIA (2018)    Patient Stated Goals wants to be walking more.    Currently in Pain? No/denies    Pain Score 0-No pain              [facility] PT Assessment - 04/22/20 1456      Standardized Balance Assessment   Standardized Balance  Assessment Berg Balance Test;10 meter walk test      Berg Balance Test   Sit to Stand Able to stand  independently using hands    Standing Unsupported Able to stand 30 seconds unsupported    Sitting with Back Unsupported but Feet Supported on Floor or Stool Able to sit safely and securely 2 minutes    Stand to Sit Controls descent by using hands    Transfers Needs one person to assist    Standing Unsupported with Eyes Closed Able to stand 10 seconds with supervision    Standing Unsupported with Feet Together Needs help to attain position but able to stand for 30 seconds with feet together    From Standing, Reach Forward with Outstretched Arm Loses balance while trying/requires external support    From Standing Position, Pick up Object from Floor Unable to try/needs assist to keep balance    From Standing Position, Turn to Look Behind Over each Shoulder Needs supervision when turning    Turn 360 Degrees Needs assistance while turning    Standing Unsupported, Alternately Place Feet on Step/Stool Needs assistance to keep from falling or unable to try    Standing Unsupported, One Foot in Front Needs help to step but can hold 15 seconds    Standing on One Leg Unable to try or needs assist to prevent fall    Total Score 19    Berg comment: <36 =  high risk for falls (close to 100%)      Timed Up and Go Test   TUG Normal TUG    Normal TUG (seconds) --   5 minutes 59.25             [facility] Adult PT Treatment/Exercise - 04/22/20 1511      Transfers   Transfers Sit to Stand;Stand to Sit    Sit to Stand 4: Min assist;4: Min guard;With upper extremity assist;From bed;From chair/3-in-1    Sit to Stand Details Tactile cues for weight shifting;Verbal cues for sequencing;Verbal cues for technique;Verbal cues for safe use of DME/AE;Manual facilitation for weight shifting;Manual facilitation for placement    Five time sit to stand comments  51.56 with UE assist from standard height chair    Stand to  Sit 4: Min guard;4: Min assist;With upper extremity assist;To chair/3-in-1    Stand to Sit Details (indicate cue type and reason) Tactile cues for weight shifting;Tactile cues for sequencing;Verbal cues for sequencing;Verbal cues for technique;Verbal cues for safe use of DME/AE;Manual facilitation for weight shifting      Ambulation/Gait   Ambulation/Gait Yes    Ambulation/Gait Assistance 4: Min guard;4: Min assist    Ambulation/Gait Assistance Details max cues for posture, step length and walker position. pt with decreased step initiation at times, "freezing" episodes     Assistive device Rolling walker    Gait Pattern Step-to pattern;Decreased step length - right;Decreased step length - left;Decreased dorsiflexion - left;Decreased dorsiflexion - right;Right foot flat;Left foot flat;Poor foot clearance - left;Poor foot clearance - right;Shuffle;Step-through pattern;Decreased stance time - right;Decreased stride length    Ambulation Surface Level;Indoor      Self-Care   Self-Care Other Self-Care Comments    Other Self-Care Comments  reviewed HEP with pt and CNA. Pt reports not doing the program for past couple weeks as she always feels tired and not wanting too. Discussed that consistent performance with assist her with decreased fatigue as her strength improves which will help her energy to improved. Pt verbalized understanding. Pt also has not been walking at home due to decreased caregiver support. CNA now plans to walk with her daily around the house. After performing standardized tests today with decreased scores reinforced need for pt to do more at home to show progress. Pt, spouse and CNA verbalized understanding.                                        PT Short Term Goals - 03/19/20 1534      PT SHORT TERM GOAL #1   Title Pt will be independent with initial HEP in order to build upon functional gains made in therapy. ALL STGS DUE 03/14/20    Baseline 03/19/20: met with current program  with CNA assistance.    Time --    Period --    Status Achieved    Target Date --      PT SHORT TERM GOAL #2   Title Pt will undergo further assessment of TUG with STG and LTG to be written as appropriate.    Baseline 03/19/20: 163.53 sec's (2 minutes, 43.53 sec's) scored today as baseline, LTG to be set by primary PT    Time --    Period --    Status Achieved      PT SHORT TERM GOAL #3   Title Pt will decr 5x sit <> stand  time with single UE support to 45 seconds or less in order to demo improved BLE strength.    Baseline 03/09/20: 53.79 seconds as baseline, not reassesed on 03/19/20 as it was just done session prior to this one    Status Not Met      PT SHORT TERM GOAL #4   Title Pt will undergo further assessment of BERG with LTG to be written as appropriate.    Baseline 03/19/20: 23/56 scored today as baseline, primary PT to set LTGs    Time --    Period --    Status Achieved      PT SHORT TERM GOAL #5   Title Pt will ambulate at least 51' with RW with supervision in order to improve household mobility.    Baseline 03/19/20: 62 feet with RW with min guard assist. improved from 20 feet, just not to goal    Time --    Period --    Status Not Met      PT SHORT TERM GOAL #6   Title Pt will perform stand pivot transfers and sit <> stands with RW with supervision in order to improve functional mobility.    Baseline 03/19/20: met in session today from RW to mat table    Time --    Period --    Status Achieved             PT Long Term Goals - 04/22/20 1454      PT LONG TERM GOAL #1   Title Pt will be independent with final HEP in order to build upon functional gains made in therapy. ALL LTGS DUE 04/16/20    Baseline 04/22/20: pt reports she has stopped doing her ex's at home the past few weeks and has not been walking at home with family    Status Not Met      PT LONG TERM GOAL #2   Title Pt will decrease TUG time to 2 minutes or less with RW in order to demo decr fall risk.     Baseline 04/22/20: 5 minutes 59.25 sec's, increased from last assessment    Time --    Period --    Status Not Met      PT LONG TERM GOAL #3   Title Pt will improve BERG score to at least a 30/56 in order to demo decr fall risk.    Baseline 04/22/20: 19/56, decreased from 23/56    Time --    Period --    Status Not Met      PT LONG TERM GOAL #4   Title Pt will decr 5x sit <> stand time to 35 seconds or less from mat table with UE support in order to demo improved functional BLE strength.    Baseline 04/22/20: 51.56 sec's with UE support from standard height chair, improved from 53.79 sec's just not to goal    Time --    Period --    Status Not Met      PT LONG TERM GOAL #5   Title Pt will ambulate at least 230' with RW vs. LRAD with supervision in order to demo improved gait efficiency.    Baseline 04/22/20: max distance of 115 feet on 03/26/20, has not walked that distance since with 30-60 feet on consecutive sessions    Time --    Period --    Status Not Met      PT LONG TERM GOAL #6   Title Pt will  perform all bed mobility with supervision in order to decr caregiver burden.    Baseline 04/22/20: unable to assess due to time constraints    Time --    Period --    Status Unable to assess          Revised/on-going LTGs:      PT Long Term Goals - 04/26/20 2115      PT LONG TERM GOAL #1   Title Pt will be independent with final HEP in order to build upon functional gains made in therapy. ALL LTGS DUE 05/24/20    Baseline 04/22/20: pt reports she has stopped doing her ex's at home the past few weeks and has not been walking at home with family    Time 4    Period Weeks    Status On-going    Target Date 05/24/20      PT LONG TERM GOAL #2   Title Pt will decrease TUG time to 3 minutes and 30 seconds or less with RW in order to demo decr fall risk.    Baseline 04/22/20: 5 minutes 59.25 sec's, increased from last assessment    Time 4    Period Weeks    Status Revised      PT  LONG TERM GOAL #3   Title Pt will improve BERG score to at least a 28/56 in order to demo decr fall risk.    Baseline 04/22/20: 19/56, decreased from 23/56    Time 4    Period Weeks    Status Revised      PT LONG TERM GOAL #4   Title Pt will decr 5x sit <> stand time to 40 seconds or less from mat table with UE support in order to demo improved functional BLE strength.    Baseline 04/22/20: 51.56 sec's with UE support from standard height chair, improved from 53.79 sec's just not to goal    Time 4    Period Weeks    Status Revised      PT LONG TERM GOAL #6   Title Pt will perform all bed mobility with supervision in order to decr caregiver burden.    Baseline 04/22/20: unable to assess due to time constraints    Time 4    Period Weeks    Status On-going               04/22/20 1451  Plan  Clinical Impression Statement Today's skilled session focused on LTGS with no goals met at this time. Pt's progress with therapy has been limited by elevated BP's (within limits to work today, still with elevated diasytolic). Pt also has not been consistent with ex's and walking at home due to poor caregiver assistance. She has a new CNA who started today and was present for therapy and involved in discussion that the pt need to do more at home in order to progress. The pt does report feeling tired all the time and not wanting to do anything, ? if pt is getting some depression. Will discuss with primary PT and continue to monitor. Primary PT to complete recert to continue to work on strengthening, gait and balance. From primary PT: Based on pt's TUG, 5x sit <> stand, and BERG scores - pt is at a significant risk for falls. Pt with a decrease in scores since last assessed. Will re-cert for an additional 2x week for 4 weeks and will re-assess goals to see if pt is making improvements with measures above. LTGs revised  and updated as appropriate.  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  Examination-Activity Limitations Bed Mobility;Bend;Locomotion Level;Stairs;Stand;Transfers;Squat  Examination-Participation Restrictions Community Activity  Pt will benefit from skilled therapeutic intervention in order to improve on the following deficits Abnormal gait;Decreased balance;Decreased activity tolerance;Decreased coordination;Decreased endurance;Decreased knowledge of use of DME;Decreased range of motion;Difficulty walking;Decreased strength;Postural dysfunction  Stability/Clinical Decision Making Stable/Uncomplicated  Rehab Potential Good  PT Frequency 2x / week  PT Duration 4 weeks  PT Treatment/Interventions ADLs/Self Care Home Management;Gait training;DME Instruction;Stair training;Functional mobility training;Therapeutic activities;Neuromuscular re-education;Balance training;Therapeutic exercise;Patient/family education;Orthotic Fit/Training;Energy conservation;Passive range of motion  PT Next Visit Plan monitor BP. continue with nustep for stengthening/activity tolerance/working on increased speed with mobility, needs transfer/turning training with RW. stepping strategies to promote increased step length,  Consulted and Agree with Plan of Care Patient;Family member/caregiver  Family Member Consulted CNA Jill Rose   Patient will benefit from skilled therapeutic intervention in order to improve the following deficits and impairments:  Abnormal gait, Decreased balance, Decreased activity tolerance, Decreased coordination, Decreased endurance, Decreased knowledge of use of DME, Decreased range of motion, Difficulty walking, Decreased strength, Postural dysfunction  Visit Diagnosis: Muscle weakness (generalized)  Hemiplegia and hemiparesis following cerebral infarction affecting left non-dominant side (HCC)  Unsteadiness on feet  Other abnormalities  of gait and mobility     Problem List Patient Active Problem List   Diagnosis Date Noted  . Controlled type 2 diabetes mellitus with hyperglycemia, without long-term current use of insulin ([place]) 04/06/2020  . Abnormality of gait 02/03/2020  . Benign essential HTN   . CKD (chronic kidney disease), stage II   . Hemiparesis affecting left side as late effect of stroke ([place])   . Labile blood glucose   . Labile blood pressure   . Elevated BUN   . Hyponatremia   . Diabetic peripheral neuropathy ([place])   . 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 Dyslipidemia   . Class 1 obesity due to excess calories with serious comorbidity and body mass index (BMI) of 30.0 to 30.9 in adult   . Left hemiparesis ([place])   . Controlled type 2 diabetes mellitus with hyperglycemia ([place])   . Morbid obesity ([place])   . Leukocytosis   . History of TIA (transient ischemic attack)   . Acute CVA (cerebrovascular accident) ([place]) 01/06/2020  . Acute ischemic stroke ([place]) 01/05/2020  . Hypertensive emergency 01/26/2017  . TIA (transient ischemic attack) 01/26/2017  . Hypothyroidism 01/26/2017  . Aphasia   . Essential hypertension   . Diabetes mellitus without complication ([place])     Willow Ora, PTA, [facility] [address] [place], [place] [address] [phone] 04/23/20, 2:24 PM   Name: Jill Rose MRN: [id number] Date of Birth: 1941-05-15   Janann August, PT, DPT 04/26/20 9:12 PM

## 2020-04-25 NOTE — Progress Notes (Signed)
Kindly inform the patient that EEG study shows mild slowing of brain activity bilaterally which is often seen in patients with memory loss.  No definite seizure activity or worrisome findings seen.

## 2020-04-26 ENCOUNTER — Telehealth: Payer: Self-pay | Admitting: Emergency Medicine

## 2020-04-26 NOTE — Addendum Note (Signed)
Addended by: Drake Leach on: 04/26/2020 09:19 PM   Modules accepted: Orders

## 2020-04-26 NOTE — Telephone Encounter (Signed)
There is no DPR on file for [facility] or [facility], called and daughter answered phone, the main number listed is her number and patient lives in [place].  Explained politely that [place] prevents me from disclosing any information, patient does not have a DPR on file for [place].  Attempted to call patient on her number listed [phone]) and no answer.  Left message to return call.

## 2020-04-27 ENCOUNTER — Encounter: Payer: Self-pay | Admitting: Emergency Medicine

## 2020-04-27 ENCOUNTER — Telehealth: Payer: Self-pay | Admitting: Emergency Medicine

## 2020-04-27 ENCOUNTER — Encounter: Payer: Self-pay | Admitting: Occupational Therapy

## 2020-04-27 ENCOUNTER — Ambulatory Visit: Payer: Medicare HMO | Admitting: Physical Therapy

## 2020-04-27 ENCOUNTER — Other Ambulatory Visit: Payer: Self-pay

## 2020-04-27 ENCOUNTER — Ambulatory Visit: Payer: Medicare HMO | Admitting: Occupational Therapy

## 2020-04-27 ENCOUNTER — Ambulatory Visit: Payer: Medicare HMO

## 2020-04-27 VITALS — BP 176/80

## 2020-04-27 VITALS — BP 109/64 | HR 96

## 2020-04-27 DIAGNOSIS — R4701 Aphasia: Secondary | ICD-10-CM

## 2020-04-27 DIAGNOSIS — R2689 Other abnormalities of gait and mobility: Secondary | ICD-10-CM

## 2020-04-27 DIAGNOSIS — R2681 Unsteadiness on feet: Secondary | ICD-10-CM

## 2020-04-27 DIAGNOSIS — R29818 Other symptoms and signs involving the nervous system: Secondary | ICD-10-CM

## 2020-04-27 DIAGNOSIS — I69354 Hemiplegia and hemiparesis following cerebral infarction affecting left non-dominant side: Secondary | ICD-10-CM | POA: Diagnosis not present

## 2020-04-27 DIAGNOSIS — M6281 Muscle weakness (generalized): Secondary | ICD-10-CM

## 2020-04-27 DIAGNOSIS — R278 Other lack of coordination: Secondary | ICD-10-CM

## 2020-04-27 DIAGNOSIS — R41841 Cognitive communication deficit: Secondary | ICD-10-CM

## 2020-04-27 NOTE — Telephone Encounter (Signed)
Called and left message to call me back on 10/11 and patient is  registered and actively utilizes MyChart.  Left patient message regarding EEG results in MyChart.

## 2020-04-27 NOTE — Therapy (Signed)
[facility] [address] [place], [place], [address] Phone: [phone]   Fax:  [phone]  Speech Language Pathology Treatment  Patient Details  Name: Jill Rose MRN: [id number] Date of Birth: [birth year] Referring Provider (SLP): Jamse Arn, MD   Encounter Date: 04/27/2020   End of Session - 04/27/20 1713    Visit Number 8    Number of Visits 17    Date for SLP Re-Evaluation 05/15/20    SLP Start Time 1404    SLP Stop Time  1445    SLP Time Calculation (min) 41 min    Activity Tolerance Patient tolerated treatment well           Past Medical History:  Diagnosis Date  . Diabetes mellitus without complication ([place])   . Essential hypertension   . High cholesterol   . Hypothyroidism   . Stroke ([place])   . TIA (transient ischemic attack) 2018    History reviewed. No pertinent surgical history.  There were no vitals filed for this visit.   Subjective Assessment - 04/27/20 1411    Subjective Pt aide ([name]) brought pt's schedule. with her but notebook/binder left at home.    Patient is accompained by: --   aide - [name]   Currently in Pain? No/denies                 ADULT SLP TREATMENT - 04/27/20 1415      General Information   Behavior/Cognition Alert;Cooperative;Requires cueing;Distractible;Decreased sustained attention      Treatment Provided   Treatment provided Cognitive-Linquistic      Cognitive-Linquistic Treatment   Treatment focused on Cognition;Aphasia;Patient/family/caregiver education    Skilled Treatment Pt req'd consistent mod-max cues faded to occasional min-mod cues, for ID'ing times of items on her schedule via SLP questions x5. SLP trained pt's caregiver [name] today for rationale for keeping as stricltly to the schedule as possible. Caregiver completely understood and began generating tasks she could engage pt with at home for "brain time." SLP encouraged pt to have her  husband assist on days when [name] is not present and pt has another aide there who may or may not be as helpful as [name] with pt's schedule. Homework is for pt and aide to rework schedule for times that pt will benefit most from [name]'s assistance with keeping the schedule. Adding "journaling" inot the schedule and adding that section to the binder/notebook. Aide to ensure pt brings entire binder next session.       Assessment / Recommendations / Plan   Plan Continue with current plan of care      Progression Toward Goals   Progression toward goals --   need for consistent aide carryover is necessary             SLP Short Term Goals - 04/23/20 0745      SLP SHORT TERM GOAL #1   Title pt will *attempt to access* her memory system for simple organization of discipline-specific items, or for temporal orientation x5 sessions    Time 1    Period Weeks    Status Not Met      SLP SHORT TERM GOAL #2   Title pt will demo knowledge of items in her memory system/notebook by correctly accessing them in 2 sessions    Time 1    Period Weeks    Status Not Met      SLP SHORT TERM GOAL #3   Title pt will demonstrate  error awareness in therapy tasks 75% with min cues for re-check in 3 sessions    Time 1    Period Weeks    Status Not Met      SLP SHORT TERM GOAL #4   Title pt will name 8 items for simple categories x 3 sessions    Time 1    Period Weeks    Status Not Met            SLP Long Term Goals - 04/27/20 1714      SLP LONG TERM GOAL #1   Title pt will demo correct usage of a memory compensation system for appointment and/or logging blood pressure and blood sugar, daily schedules in 3 sessions    Time 3    Period Weeks    Status On-going      SLP LONG TERM GOAL #2   Title Pt will maintain selective attention in functional task for 8 minutes in mod noisy environment with rare min cues x 2 sessions.    Time 3    Period Weeks    Status On-going      SLP LONG TERM GOAL #3    Title pt will demo anticipatory awareness regarding deficit areas, independently, in 3 sessions    Time 3    Period Weeks    Status On-going      SLP LONG TERM GOAL #4   Title pt will use compensations for anomia in 8 minutes simple conversation x3 visits    Time 3    Period Weeks    Status On-going            Plan - 04/27/20 1714    Clinical Impression Statement Jill Rose presents with overall moderate cognitive communication deficits. Pt would benefit from audiological eval. Suspect attention, primarily, and slow processing contributes to this difficulty. Prior to her CVA pt enjoyed cooking and reading about natural medicine/ ayurveda. I recommend skilled ST to address cognitive and language deficits for better communication with family and friends, to increase independence and improve quality of life.    Speech Therapy Frequency 2x / week    Duration --   8 weeks or 17 visits   Treatment/Interventions SLP instruction and feedback;Functional tasks;Cognitive reorganization;Compensatory strategies;Internal/external aids;Multimodal communcation approach;Patient/family education;Environmental controls;Language facilitation;Compensatory techniques    Potential to Achieve Goals Good    Consulted and Agree with Plan of Care Patient           Patient will benefit from skilled therapeutic intervention in order to improve the following deficits and impairments:   Cognitive communication deficit  Aphasia    Problem List Patient Active Problem List   Diagnosis Date Noted  . Controlled type 2 diabetes mellitus with hyperglycemia, without long-term current use of insulin ([place]) 04/06/2020  . Abnormality of gait 02/03/2020  . Benign essential HTN   . CKD (chronic kidney disease), stage II   . Hemiparesis affecting left side as late effect of stroke ([place])   . Labile blood glucose   . Labile blood pressure   . Elevated BUN   . Hyponatremia   . Diabetic peripheral neuropathy ([place])   .  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 Dyslipidemia   . Class 1 obesity due to excess calories with serious comorbidity and body mass index (BMI) of 30.0 to 30.9 in adult   . Left hemiparesis ([place])   . Controlled type 2 diabetes mellitus with hyperglycemia ([place])   . Morbid obesity ([place])   .  Leukocytosis   . History of TIA (transient ischemic attack)   . Acute CVA (cerebrovascular accident) ([place]) 01/06/2020  . Acute ischemic stroke ([place]) 01/05/2020  . Hypertensive emergency 01/26/2017  . TIA (transient ischemic attack) 01/26/2017  . Hypothyroidism 01/26/2017  . Aphasia   . Essential hypertension   . Diabetes mellitus without complication ([place])     [place] ,[place], [place]  04/27/2020, 5:15 PM  [facility] [address] [place], [place], [address] Phone: [phone]   Fax:  [phone]   Name: Jill Rose MRN: [id number] Date of Birth: 1941-04-18

## 2020-04-27 NOTE — Therapy (Signed)
[facility] [address] [place], [place], [address] Phone: [phone]   Fax:  [phone]  Occupational Therapy Treatment  Patient Details  Name: Jill Rose MRN: [id number] Date of Birth: 01/29/1941 No data recorded  Encounter Date: 04/27/2020   OT End of Session - 04/27/20 1322    Visit Number 7    Number of Visits 17    Date for OT Re-Evaluation 05/16/20    Authorization Type [employer] MCR    Authorization - Visit Number 7    Authorization - Number of Visits 10    Progress Note Due on Visit 10    OT Start Time 1320    OT Stop Time 1400    OT Time Calculation (min) 40 min    Activity Tolerance Patient tolerated treatment well    Behavior During Therapy [facility] for tasks assessed/performed           Past Medical History:  Diagnosis Date  . Diabetes mellitus without complication ([facility])   . Essential hypertension   . High cholesterol   . Hypothyroidism   . Stroke ([facility])   . TIA (transient ischemic attack) 2018    History reviewed. No pertinent surgical history.  Vitals:   04/27/20 1225  BP: 109/64  Pulse: 96     Subjective Assessment - 04/27/20 1428    Subjective  Pt reports pain with shoulder flexion with LUE during activities.    Pertinent History Rt MCA CVA 01/05/20. PMH: HTN, HLD, T2DM, Diabetic neuropathy    Limitations no driving, fall risk, HOH    Currently in Pain? Yes    Pain Score 5     Pain Location Shoulder    Pain Orientation Left    Pain Descriptors / Indicators Aching    Pain Onset More than a month ago    Pain Frequency Intermittent    Aggravating Factors  flexion           Treatment: Standing Tolerance - static standing with functional reach with LUE with resistance clothespins with min A for standing and increased time for LUE coordination and strength. Seated rest break after approx 3 minutes. 1-5#  Sit <> stand transfers with min A physical assist and max verbal cues. Pt with  fear of falling impeding ability to weight shift and take steps forward and backward  [employer] with LUE in standing x 3 minutes and sitting         OT Short Term Goals - 04/22/20 1514      OT SHORT TERM GOAL #1   Title Independent with coordination HEP Lt hand    Time 4    Period Weeks    Status On-going   Issued coordination HEP 03/19/20     OT SHORT TERM GOAL #2   Title Pt to perform UE dressing with min assist or less and LE dressing with mod assist or less    Time 4    Period Weeks    Status On-going   Pt reports doing approximately 50% of UB dressing 03/19/20     OT SHORT TERM GOAL #3   Title Pt to perform simple snack prep/cold meal prep standing level with DME and supervision prn    Time 4    Period Weeks    Status On-going   Pt reports assisting with cutting vegetables with husband 03/19/20 - seated.     OT SHORT TERM GOAL #4   Title Pt to perform sustained high level reaching  LUE for endurance    Time 4    Period Weeks    Status New      OT SHORT TERM GOAL #5   Title Pt to return to putting up/styling hair mod I level    Time 4    Period Weeks    Status On-going   Pt reports needing assistance for clasps and barettes            OT Long Term Goals - 04/22/20 1514      OT LONG TERM GOAL #1   Title Independent with strengthening HEP for LUE    Time 8    Period Weeks    Status New      OT LONG TERM GOAL #2   Title Improve coordination Lt hand as evidenced by performing 9 hole peg test in 41 sec or less    Baseline 48.59 sec    Time 8    Period Weeks    Status New      OT LONG TERM GOAL #3   Title Pt to be mod I level with all BADLS    Time 8    Period Weeks    Status New      OT LONG TERM GOAL #4   Title Pt to cook simple meal with DME prn and distant supervision    Time 8    Period Weeks    Status New      OT LONG TERM GOAL #5   Title Pt to perform light cleaning tasks (washing dishes, folding laundry, etc) mod I level    Time 8     Period Weeks    Status New                 Plan - 04/27/20 1431    Clinical Impression Statement Patient demonstrates decline in funtional mobility and cognition since evaluation. Pt with significant fear of falling impeding functional mobility and transfers. Pt with good functional reach with LUE this day but reports pain.    OT Occupational Profile and History Detailed Assessment- Review of Records and additional review of physical, cognitive, psychosocial history related to current functional performance    Occupational performance deficits (Please refer to evaluation for details): ADL's;IADL's    Body Structure / Function / Physical Skills ADL;ROM;Balance;Body mechanics;Mobility;Flexibility;Strength;Coordination;FMC;UE functional use;Decreased knowledge of use of DME    Rehab Potential Good    Clinical Decision Making Several treatment options, min-mod task modification necessary    Comorbidities Affecting Occupational Performance: May have comorbidities impacting occupational performance    Modification or Assistance to Complete Evaluation  Min-Moderate modification of tasks or assist with assess necessary to complete eval    OT Frequency 2x / week    OT Duration 8 weeks   Plus eval   OT Treatment/Interventions Self-care/ADL training;Therapeutic exercise;Functional Mobility Training;Neuromuscular education;Manual Therapy;Therapeutic activities;Coping strategies training;DME and/or AE instruction;Cognitive remediation/compensation;Moist Heat;Passive range of motion;Patient/family education    Plan postural control, stand balance static to dynamic, transitional movement, LUE NMR    Consulted and Agree with Plan of Care Patient;Family member/caregiver    Family Member Consulted caregiver CNA           Patient will benefit from skilled therapeutic intervention in order to improve the following deficits and impairments:   Body Structure / Function / Physical Skills: ADL, ROM,  Balance, Body mechanics, Mobility, Flexibility, Strength, Coordination, FMC, UE functional use, Decreased knowledge of use of DME       Visit Diagnosis: Unsteadiness  on feet  Muscle weakness (generalized)  Other lack of coordination  Other abnormalities of gait and mobility  Hemiplegia and hemiparesis following cerebral infarction affecting left non-dominant side [facility])    Problem List Patient Active Problem List   Diagnosis Date Noted  . Controlled type 2 diabetes mellitus with hyperglycemia, without long-term current use of insulin ([facility]) 04/06/2020  . Abnormality of gait 02/03/2020  . Benign essential HTN   . CKD (chronic kidney disease), stage II   . Hemiparesis affecting left side as late effect of stroke ([facility])   . Labile blood glucose   . Labile blood pressure   . Elevated BUN   . Hyponatremia   . Diabetic peripheral neuropathy ([facility])   . 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 Dyslipidemia   . Class 1 obesity due to excess calories with serious comorbidity and body mass index (BMI) of 30.0 to 30.9 in adult   . Left hemiparesis ([facility])   . Controlled type 2 diabetes mellitus with hyperglycemia ([facility])   . Morbid obesity ([facility])   . Leukocytosis   . History of TIA (transient ischemic attack)   . Acute CVA (cerebrovascular accident) ([facility]) 01/06/2020  . Acute ischemic stroke ([facility]) 01/05/2020  . Hypertensive emergency 01/26/2017  . TIA (transient ischemic attack) 01/26/2017  . Hypothyroidism 01/26/2017  . Aphasia   . Essential hypertension   . Diabetes mellitus without complication [facility])     Junious Dresser MOT, OTR/L  04/27/2020, 2:33 PM  [facility] [address] [place], [place], [address] Phone: [phone]   Fax:  [phone]  Name: Jill Rose MRN: [id number] Date of Birth: [birth year]

## 2020-04-27 NOTE — Therapy (Signed)
[facility] [address] [place], [place], [address] Phone: [phone]   Fax:  [phone]  Physical Therapy Treatment  Patient Details  Name: Jill Rose MRN: [id number] Date of Birth: Sep 26, 1940 Referring Provider (PT): Fortino Sic, [place] (being followed by Dr. Posey Pronto)   Encounter Date: 04/27/2020   PT End of Session - 04/27/20 1630    Visit Number 11    Number of Visits 18    Date for PT Re-Evaluation 06/25/20   written for 4 week POC   Authorization Type [employer]- $35 co pay per day regardless of discipline    PT Start Time 2951    PT Stop Time 1530    PT Time Calculation (min) 45 min    Equipment Utilized During Treatment Gait belt    Activity Tolerance Patient tolerated treatment well;Patient limited by fatigue    Behavior During Therapy [facility] for tasks assessed/performed           Past Medical History:  Diagnosis Date  . Diabetes mellitus without complication ([place])   . Essential hypertension   . High cholesterol   . Hypothyroidism   . Stroke ([place])   . TIA (transient ischemic attack) 2018    No past surgical history on file.  Vitals:   04/27/20 1502 04/27/20 1526  BP: (!) 180/82 (!) 176/80     Subjective Assessment - 04/27/20 1450    Subjective Has been doing more with the CNA at home in regards to walking and exercises. CNA states that BP has been good at home.    Patient is accompained by: Family member   spouse, CNA   Pertinent History diabetes mellitus, HTN, hypothyroidism, TIA (2018)    Patient Stated Goals wants to be walking more.    Currently in Pain? Yes    Pain Score --   "less than 5" - pain in back of leg from sitting in w/c                            [facility] Adult PT Treatment/Exercise - 04/27/20 1521      Transfers   Transfers Sit to Stand;Stand to Sit    Sit to Stand 4: Min assist;4: Min guard;With upper extremity assist;From bed    Sit to Stand  Details Tactile cues for weight shifting;Verbal cues for sequencing;Verbal cues for technique;Verbal cues for safe use of DME/AE;Manual facilitation for weight shifting;Manual facilitation for placement    Sit to Stand Details (indicate cue type and reason) needs max verbal cues for proper UE placement and demo cues at times    Stand to Sit 4: Min guard;4: Min assist;With upper extremity assist    Stand to Sit Details cues to reach posteriorly back towards chair    Stand Pivot Transfers 4: Min assist    Stand Pivot Transfer Details (indicate cue type and reason) with RW, takes incr time to perform, manual and verbal cues for turning RW and for stepping, pt takes small shuffled steps    Comments performed x5 reps sit <> stands from lower mat table, pt needing max cues to scoot towards edge and to perform with nose over toes, takes incr time to stand, once in standing, verbal and tactile cues for posture      Exercises   Exercises Other Exercises    Other Exercises  Standing with RW: attempting to perform alternating step taps to 4" step - pt only able  to perform 1 after max cues and encouragement before needing to sit down due to feeling fatiged, performed alternating seated marching x10 reps, LAQs x10 reps with LLE - with max verbal and visual cues to kick towards therapist's hand and hold for 2 seconds,  x10 reps light therapist resistance seated hamstring curls on LLE, cues for full ROM                   PT Short Term Goals - 03/19/20 1534      PT SHORT TERM GOAL #1   Title Pt will be independent with initial HEP in order to build upon functional gains made in therapy. ALL STGS DUE 03/14/20    Baseline 03/19/20: met with current program with CNA assistance.    Time --    Period --    Status Achieved    Target Date --      PT SHORT TERM GOAL #2   Title Pt will undergo further assessment of TUG with STG and LTG to be written as appropriate.    Baseline 03/19/20: 163.53 sec's (2  minutes, 43.53 sec's) scored today as baseline, LTG to be set by primary PT    Time --    Period --    Status Achieved      PT SHORT TERM GOAL #3   Title Pt will decr 5x sit <> stand time with single UE support to 45 seconds or less in order to demo improved BLE strength.    Baseline 03/09/20: 53.79 seconds as baseline, not reassesed on 03/19/20 as it was just done session prior to this one    Status Not Met      PT SHORT TERM GOAL #4   Title Pt will undergo further assessment of BERG with LTG to be written as appropriate.    Baseline 03/19/20: 23/56 scored today as baseline, primary PT to set LTGs    Time --    Period --    Status Achieved      PT SHORT TERM GOAL #5   Title Pt will ambulate at least 77' with RW with supervision in order to improve household mobility.    Baseline 03/19/20: 62 feet with RW with min guard assist. improved from 20 feet, just not to goal    Time --    Period --    Status Not Met      PT SHORT TERM GOAL #6   Title Pt will perform stand pivot transfers and sit <> stands with RW with supervision in order to improve functional mobility.    Baseline 03/19/20: met in session today from RW to mat table    Time --    Period --    Status Achieved             PT Long Term Goals - 04/26/20 2115      PT LONG TERM GOAL #1   Title Pt will be independent with final HEP in order to build upon functional gains made in therapy. ALL LTGS DUE 05/24/20    Baseline 04/22/20: pt reports she has stopped doing her ex's at home the past few weeks and has not been walking at home with family    Time 4    Period Weeks    Status On-going    Target Date 05/24/20      PT LONG TERM GOAL #2   Title Pt will decrease TUG time to 3 minutes and 30 seconds or less with RW  in order to demo decr fall risk.    Baseline 04/22/20: 5 minutes 59.25 sec's, increased from last assessment    Time 4    Period Weeks    Status Revised      PT LONG TERM GOAL #3   Title Pt will improve BERG score  to at least a 28/56 in order to demo decr fall risk.    Baseline 04/22/20: 19/56, decreased from 23/56    Time 4    Period Weeks    Status Revised      PT LONG TERM GOAL #4   Title Pt will decr 5x sit <> stand time to 40 seconds or less from mat table with UE support in order to demo improved functional BLE strength.    Baseline 04/22/20: 51.56 sec's with UE support from standard height chair, improved from 53.79 sec's just not to goal    Time 4    Period Weeks    Status Revised      PT LONG TERM GOAL #6   Title Pt will perform all bed mobility with supervision in order to decr caregiver burden.    Baseline 04/22/20: unable to assess due to time constraints    Time 4    Period Weeks    Status On-going                 Plan - 04/27/20 1631    Clinical Impression Statement Pt's BP WFL for therapy today - although elevated. Pt's new CNA present throughout session who reports that they have been working more at home on gait and exercises. Pt fatigued easily with standing exercises and needed breaks throughout. Takes incr time with sit <> stand and stand pivot transfers with use of RW. Needs reminder cues throughout session for proper UE placement with sit <> stands. Will continue to progress towards LTGs.    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    Examination-Activity Limitations Bed Mobility;Bend;Locomotion Level;Stairs;Stand;Transfers;Squat    Examination-Participation Restrictions Community Activity    Stability/Clinical Decision Making Stable/Uncomplicated    Rehab Potential Good    PT Frequency 2x / week    PT Duration 4 weeks    PT Treatment/Interventions ADLs/Self Care Home Management;Gait training;DME Instruction;Stair training;Functional mobility training;Therapeutic activities;Neuromuscular re-education;Balance  training;Therapeutic exercise;Patient/family education;Orthotic Fit/Training;Energy conservation;Passive range of motion    PT Next Visit Plan **pt needs to be scheduled out for 2 more weeks through novemeber. monitor BP. any kind of LE strengthening. continue with nustep for stengthening/activity tolerance/working on increased speed with mobility, needs transfer/turning training with RW. stepping strategies to promote increased step length,    Consulted and Agree with Plan of Care Patient;Family member/caregiver    Family Member Consulted CNA [name]           Patient will benefit from skilled therapeutic intervention in order to improve the following deficits and impairments:  Abnormal gait, Decreased balance, Decreased activity tolerance, Decreased coordination, Decreased endurance, Decreased knowledge of use of DME, Decreased range of motion, Difficulty walking, Decreased strength, Postural dysfunction  Visit Diagnosis: Hemiplegia and hemiparesis following cerebral infarction affecting left non-dominant side (HCC)  Unsteadiness on feet  Other symptoms and signs involving the nervous system  Other lack of coordination  Muscle weakness (generalized)     Problem List Patient Active Problem List   Diagnosis Date Noted  . Controlled type 2 diabetes mellitus with  hyperglycemia, without long-term current use of insulin ([place]) 04/06/2020  . Abnormality of gait 02/03/2020  . Benign essential HTN   . CKD (chronic kidney disease), stage II   . Hemiparesis affecting left side as late effect of stroke ([place])   . Labile blood glucose   . Labile blood pressure   . Elevated BUN   . Hyponatremia   . Diabetic peripheral neuropathy ([place])   . 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 Dyslipidemia   . Class 1 obesity due to excess calories with serious comorbidity and body mass index (BMI) of 30.0 to 30.9 in adult   . Left hemiparesis ([place])   . Controlled type 2 diabetes mellitus  with hyperglycemia ([place])   . Morbid obesity ([place])   . Leukocytosis   . History of TIA (transient ischemic attack)   . Acute CVA (cerebrovascular accident) ([place]) 01/06/2020  . Acute ischemic stroke ([place]) 01/05/2020  . Hypertensive emergency 01/26/2017  . TIA (transient ischemic attack) 01/26/2017  . Hypothyroidism 01/26/2017  . Aphasia   . Essential hypertension   . Diabetes mellitus without complication ([place])     Arliss Journey, PT, DPT  04/27/2020, 4:37 PM  [facility] [address] [place], [place], [address] Phone: [phone]   Fax:  [phone]  Name: Jill Rose MRN: [id number] Date of Birth: Jul 01, 1941

## 2020-04-29 ENCOUNTER — Ambulatory Visit: Payer: Medicare HMO | Admitting: Occupational Therapy

## 2020-04-29 ENCOUNTER — Other Ambulatory Visit: Payer: Self-pay

## 2020-04-29 ENCOUNTER — Encounter: Payer: Self-pay | Admitting: Physical Therapy

## 2020-04-29 ENCOUNTER — Ambulatory Visit: Payer: Medicare HMO | Admitting: Physical Therapy

## 2020-04-29 ENCOUNTER — Ambulatory Visit: Payer: Medicare HMO

## 2020-04-29 VITALS — BP 154/76

## 2020-04-29 DIAGNOSIS — R278 Other lack of coordination: Secondary | ICD-10-CM

## 2020-04-29 DIAGNOSIS — R41841 Cognitive communication deficit: Secondary | ICD-10-CM

## 2020-04-29 DIAGNOSIS — M6281 Muscle weakness (generalized): Secondary | ICD-10-CM

## 2020-04-29 DIAGNOSIS — R2681 Unsteadiness on feet: Secondary | ICD-10-CM

## 2020-04-29 DIAGNOSIS — I69354 Hemiplegia and hemiparesis following cerebral infarction affecting left non-dominant side: Secondary | ICD-10-CM

## 2020-04-29 DIAGNOSIS — R4701 Aphasia: Secondary | ICD-10-CM

## 2020-04-29 DIAGNOSIS — R2689 Other abnormalities of gait and mobility: Secondary | ICD-10-CM

## 2020-04-29 NOTE — Therapy (Signed)
[facility] [address] [place], [place], [address] Phone: [phone]   Fax:  [phone]  Speech Language Pathology Treatment  Patient Details  Name: Jill Rose MRN: [id number] Date of Birth: 12/07/1940 Referring Provider (SLP): Jamse Arn, MD   Encounter Date: 04/29/2020   End of Session - 04/29/20 1517    Visit Number 9    Number of Visits 17    Date for SLP Re-Evaluation 05/15/20    SLP Start Time 4680    SLP Stop Time  1445    SLP Time Calculation (min) 42 min    Activity Tolerance Patient tolerated treatment well           Past Medical History:  Diagnosis Date  . Diabetes mellitus without complication ([place])   . Essential hypertension   . High cholesterol   . Hypothyroidism   . Stroke ([place])   . TIA (transient ischemic attack) 2018    History reviewed. No pertinent surgical history.  There were no vitals filed for this visit.   Subjective Assessment - 04/29/20 1506    Subjective Binder brought today.    Patient is accompained by: --   [name] -aide                ADULT SLP TREATMENT - 04/29/20 1506      General Information   Behavior/Cognition Alert;Cooperative;Requires cueing;Distractible;Decreased sustained attention      Treatment Provided   Treatment provided Cognitive-Linquistic      Cognitive-Linquistic Treatment   Treatment focused on Cognition;Patient/family/caregiver education    Skilled Treatment SLP cont to educate aide that instead of aide, ask pt to perform tasks like writing her activities in the activities journal, and allow pt to look for therapy schedule in her notebook and report next therapy or day of appointment, etc.  To target sustained/selective attention SLP engaged pt with simple calendar task - pt req'd mod cues to tell SLP correct day of the week and what therapy was after ST. In another task for attention, SLP asked pt about a simple recipe she  cooks. She told SLP fried eggplant recipe verbally with minimal assist and then dictated so SLP could write on white board. Pt forgot a spice she told SLP during verbal sequencing portion.       Assessment / Recommendations / Plan   Plan Continue with current plan of care      Progression Toward Goals   Progression toward goals Progressing toward goals              SLP Short Term Goals - 04/23/20 0745      SLP SHORT TERM GOAL #1   Title pt will *attempt to access* her memory system for simple organization of discipline-specific items, or for temporal orientation x5 sessions    Time 1    Period Weeks    Status Not Met      SLP SHORT TERM GOAL #2   Title pt will demo knowledge of items in her memory system/notebook by correctly accessing them in 2 sessions    Time 1    Period Weeks    Status Not Met      SLP SHORT TERM GOAL #3   Title pt will demonstrate error awareness in therapy tasks 75% with min cues for re-check in 3 sessions    Time 1    Period Weeks    Status Not Met      SLP SHORT TERM GOAL #4  Title pt will name 8 items for simple categories x 3 sessions    Time 1    Period Weeks    Status Not Met            SLP Long Term Goals - 04/29/20 1518      SLP LONG TERM GOAL #1   Title pt will demo correct usage of a memory compensation system for appointment and/or logging blood pressure and blood sugar, daily schedules in 3 sessions    Time 3    Period Weeks    Status On-going      SLP LONG TERM GOAL #2   Title Pt will maintain selective attention in functional task for 8 minutes in mod noisy environment with rare min cues x 2 sessions.    Time 3    Period Weeks    Status On-going      SLP LONG TERM GOAL #3   Title pt will demo anticipatory awareness regarding deficit areas, independently, in 3 sessions    Time 3    Period Weeks    Status On-going      SLP LONG TERM GOAL #4   Title pt will use compensations for anomia in 8 minutes simple conversation  x3 visits    Time 3    Period Weeks    Status On-going            Plan - 04/29/20 1517    Clinical Impression Statement Mrs. Minichiello presents with overall moderate cognitive communication deficits. Pt would benefit from audiological eval. Suspect attention, primarily, and slow processing contributes to this difficulty. Prior to her CVA pt enjoyed cooking and reading about natural medicine/ ayurveda. I recommend skilled ST to address cognitive and language deficits for better communication with family and friends, to increase independence and improve quality of life.    Speech Therapy Frequency 2x / week    Duration --   8 weeks or 17 visits   Treatment/Interventions SLP instruction and feedback;Functional tasks;Cognitive reorganization;Compensatory strategies;Internal/external aids;Multimodal communcation approach;Patient/family education;Environmental controls;Language facilitation;Compensatory techniques    Potential to Achieve Goals Good    Consulted and Agree with Plan of Care Patient           Patient will benefit from skilled therapeutic intervention in order to improve the following deficits and impairments:   Cognitive communication deficit  Aphasia    Problem List Patient Active Problem List   Diagnosis Date Noted  . Controlled type 2 diabetes mellitus with hyperglycemia, without long-term current use of insulin ([place]) 04/06/2020  . Abnormality of gait 02/03/2020  . Benign essential HTN   . CKD (chronic kidney disease), stage II   . Hemiparesis affecting left side as late effect of stroke ([place])   . Labile blood glucose   . Labile blood pressure   . Elevated BUN   . Hyponatremia   . Diabetic peripheral neuropathy ([place])   . 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 Dyslipidemia   . Class 1 obesity due to excess calories with serious comorbidity and body mass index (BMI) of 30.0 to 30.9 in adult   . Left hemiparesis ([place])   . Controlled type 2 diabetes  mellitus with hyperglycemia ([place])   . Morbid obesity ([place])   . Leukocytosis   . History of TIA (transient ischemic attack)   . Acute CVA (cerebrovascular accident) ([place]) 01/06/2020  . Acute ischemic stroke ([place]) 01/05/2020  . Hypertensive emergency 01/26/2017  . TIA (transient ischemic attack) 01/26/2017  .  Hypothyroidism 01/26/2017  . Aphasia   . Essential hypertension   . Diabetes mellitus without complication ([place])     [place] ,[place], [place]  04/29/2020, 3:19 PM  [facility] [address] [place], [place], [address] Phone: [phone]   Fax:  [phone]   Name: JEARLDINE CASSADY MRN: [id number] Date of Birth: [birth year]

## 2020-04-29 NOTE — Therapy (Signed)
[facility] [address] [place], [place], [address] Phone: [phone]   Fax:  [phone]  Occupational Therapy Treatment  Patient Details  Name: Jill Rose MRN: [id number] Date of Birth: Jul 01, 1941 No data recorded  Encounter Date: 04/29/2020   OT End of Session - 04/29/20 1543    Visit Number 8    Number of Visits 17    Date for OT Re-Evaluation 05/16/20    Authorization Type [employer] MCR    Authorization - Visit Number 8    Authorization - Number of Visits 10    Progress Note Due on Visit 10    OT Start Time 1445    OT Stop Time 1530    OT Time Calculation (min) 45 min    Activity Tolerance Patient tolerated treatment well    Behavior During Therapy [employer] for tasks assessed/performed           Past Medical History:  Diagnosis Date  . Diabetes mellitus without complication (HCC)   . Essential hypertension   . High cholesterol   . Hypothyroidism   . Stroke (HCC)   . TIA (transient ischemic attack) 2018    No past surgical history on file.  There were no vitals filed for this visit.   Subjective Assessment - 04/29/20 1541    Subjective  No pain reported during today's session - mostly working on ADLS    Pertinent History Rt MCA CVA 01/05/20. PMH: HTN, HLD, T2DM, Diabetic neuropathy    Limitations no driving, fall risk, HOH    Currently in Pain? No/denies    Pain Onset More than a month ago           Worked on UB dressing using hemi techniques w/ caregiver present to carryover in pt's home environment. Pt able to doff shirt w/ demo and simple v.c's and don shirt w/ min assist to straighten shirt and pull down in back.  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 Pt doffed shorts with min assist to get off feet. Pt required assist to fix  heels of shoes when donning, but could doff slip on style shoes w/ extra time.  Caregiver education on carryover and consistency at home, allowing pt extra time to process information and allowing her to struggle some before offering assistance, as well as types of cueing (simple v.c's, however tactile cues seem to work best)  Attempted walker negotiation with short distance ambulation, however limited d/t apraxia and fear of falling. Pt reluctant to wt shift and max difficulty picking up feet to ambulate.                     OT Education - 04/29/20 1546    Education Details reviewed hemi dressing techniques with caregiver, emphasized simple verbal commands and extra time for patient to process information, consistency/carryover with cues and techniques for dressing    Person(s) Educated Patient;Caregiver(s)    Methods Explanation    Comprehension Verbalized understanding   caregiver verbalized understanding           OT Short Term Goals - 04/22/20 1514      OT SHORT TERM GOAL #1   Title Independent with coordination HEP Lt hand    Time 4    Period Weeks    Status On-going  Issued coordination HEP 03/19/20     OT SHORT TERM GOAL #2   Title Pt to perform UE dressing with min assist or less and LE dressing with mod assist or less    Time 4    Period Weeks    Status On-going   Pt reports doing approximately 50% of UB dressing 03/19/20     OT SHORT TERM GOAL #3   Title Pt to perform simple snack prep/cold meal prep standing level with DME and supervision prn    Time 4    Period Weeks    Status On-going   Pt reports assisting with cutting vegetables with husband 03/19/20 - seated.     OT SHORT TERM GOAL #4   Title Pt to perform sustained high level reaching LUE for endurance    Time 4    Period Weeks    Status New      OT SHORT TERM GOAL #5   Title Pt to return to putting up/styling hair mod I level    Time 4    Period Weeks    Status On-going   Pt reports needing  assistance for clasps and barettes            OT Long Term Goals - 04/22/20 1514      OT LONG TERM GOAL #1   Title Independent with strengthening HEP for LUE    Time 8    Period Weeks    Status New      OT LONG TERM GOAL #2   Title Improve coordination Lt hand as evidenced by performing 9 hole peg test in 41 sec or less    Baseline 48.59 sec    Time 8    Period Weeks    Status New      OT LONG TERM GOAL #3   Title Pt to be mod I level with all BADLS    Time 8    Period Weeks    Status New      OT LONG TERM GOAL #4   Title Pt to cook simple meal with DME prn and distant supervision    Time 8    Period Weeks    Status New      OT LONG TERM GOAL #5   Title Pt to perform light cleaning tasks (washing dishes, folding laundry, etc) mod I level    Time 8    Period Weeks    Status New                 Plan - 04/29/20 1543    Clinical Impression Statement 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 Pt responds best to tactile cues for wt shifts in prep for ambulation    OT Occupational Profile and History Detailed Assessment- Review of Records and additional review of physical, cognitive, psychosocial history related to current functional performance    Occupational performance deficits (Please refer to evaluation for details): ADL's;IADL's    Body Structure / Function / Physical Skills ADL;ROM;Balance;Body mechanics;Mobility;Flexibility;Strength;Coordination;FMC;UE functional use;Decreased knowledge of use of DME    Rehab Potential Good    Clinical Decision Making Several treatment options, min-mod task modification necessary    Comorbidities Affecting Occupational Performance: May have comorbidities impacting occupational performance    Modification or Assistance to Complete Evaluation  Min-Moderate modification of tasks or assist with assess necessary to complete eval    OT Frequency 2x / week    OT Duration 8 weeks   Plus eval  OT Treatment/Interventions  Self-care/ADL training;Therapeutic exercise;Functional Mobility Training;Neuromuscular education;Manual Therapy;Therapeutic activities;Coping strategies training;DME and/or AE instruction;Cognitive remediation/compensation;Moist Heat;Passive range of motion;Patient/family education    Plan begin assessing STG's and review coordination HEP (adapt HEP prn), work on wt shifts in standing    Consulted and Agree with Plan of Care Patient;Family member/caregiver    Family Member Consulted caregiver CNA           Patient will benefit from skilled therapeutic intervention in order to improve the following deficits and impairments:   Body Structure / Function / Physical Skills: ADL, ROM, Balance, Body mechanics, Mobility, Flexibility, Strength, Coordination, FMC, UE functional use, Decreased knowledge of use of DME       Visit Diagnosis: Hemiplegia and hemiparesis following cerebral infarction affecting left non-dominant side (HCC)  Unsteadiness on feet  Other lack of coordination    Problem List Patient Active Problem List   Diagnosis Date Noted  . Controlled type 2 diabetes mellitus with hyperglycemia, without long-term current use of insulin (HCC) 04/06/2020  . Abnormality of gait 02/03/2020  . Benign essential HTN   . CKD (chronic kidney disease), stage II   . Hemiparesis affecting left side as late effect of stroke (HCC)   . Labile blood glucose   . Labile blood pressure   . Elevated BUN   . Hyponatremia   . Diabetic peripheral neuropathy (HCC)   . 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 Dyslipidemia   . Class 1 obesity due to excess calories with serious comorbidity and body mass index (BMI) of 30.0 to 30.9 in adult   . Left hemiparesis (HCC)   . Controlled type 2 diabetes mellitus with hyperglycemia (HCC)   . Morbid obesity (HCC)   . Leukocytosis   . History of TIA (transient ischemic attack)   . Acute CVA (cerebrovascular accident) (HCC) 01/06/2020  .  Acute ischemic stroke (HCC) 01/05/2020  . Hypertensive emergency 01/26/2017  . TIA (transient ischemic attack) 01/26/2017  . Hypothyroidism 01/26/2017  . Aphasia   . Essential hypertension   . Diabetes mellitus without complication (HCC)     Kelli Churn, OTR/L 04/29/2020, 3:48 PM  [facility] [address] [place], [place], [address] Phone: [phone]   Fax:  [phone]  Name: CRISSA SOWDER MRN: [id number] Date of Birth: [birth year]

## 2020-04-30 NOTE — Therapy (Signed)
[facility] [address] [place], [place], [address] Phone: [phone]   Fax:  [phone]  Physical Therapy Treatment  Patient Details  Name: Jill Rose MRN: [id number] Date of Birth: 1940-08-03 Referring Provider (PT): Fortino Sic, [place] (being followed by Dr. Posey Pronto)   Encounter Date: 04/29/2020   PT End of Session - 04/29/20 1542    Visit Number 12    Number of Visits 18    Date for PT Re-Evaluation 06/25/20   written for 4 week POC   Authorization Type Aetna Medicare- $35 co pay per day regardless of discipline    PT Start Time 1532    PT Stop Time 1615    PT Time Calculation (min) 43 min    Equipment Utilized During Treatment Gait belt    Activity Tolerance Patient tolerated treatment well;Patient limited by fatigue    Behavior During Therapy [facility] for tasks assessed/performed           Past Medical History:  Diagnosis Date  . Diabetes mellitus without complication ([place])   . Essential hypertension   . High cholesterol   . Hypothyroidism   . Stroke ([place])   . TIA (transient ischemic attack) 2018    History reviewed. No pertinent surgical history.  Vitals:   04/29/20 1540  BP: (!) 154/76     Subjective Assessment - 04/29/20 1540    Subjective No new complaints. Having some left knee pain. Reports this increases with gait. Has been walking with CNA at home.    Patient is accompained by: Family member   CNA   Pertinent History diabetes mellitus, HTN, hypothyroidism, TIA (2018)    Patient Stated Goals wants to be walking more.    Currently in Pain? Yes    Pain Score 5     Pain Location Knee    Pain Orientation Right;Left    Pain Descriptors / Indicators Aching;Sore    Pain Type Acute pain    Pain Onset More than a month ago    Pain Frequency Constant    Aggravating Factors  walking    Pain Relieving Factors "no idea"                [facility] Adult PT Treatment/Exercise - 04/29/20  1546      Transfers   Transfers Sit to Stand;Stand to Sit    Sit to Stand 4: Min guard;4: Min assist;With upper extremity assist;From bed;From chair/3-in-1    Sit to Stand Details Tactile cues for weight shifting;Verbal cues for sequencing;Verbal cues for technique;Verbal cues for safe use of DME/AE;Manual facilitation for weight shifting;Manual facilitation for placement    Stand to Sit 4: Min guard;4: Min assist;With upper extremity assist    Stand to Sit Details (indicate cue type and reason) Tactile cues for weight shifting;Tactile cues for sequencing;Verbal cues for sequencing;Verbal cues for technique;Verbal cues for safe use of DME/AE;Manual facilitation for weight shifting      Ambulation/Gait   Ambulation/Gait Yes    Ambulation/Gait Assistance 4: Min guard;4: Min assist;3: Mod assist    Ambulation/Gait Assistance Details cues/faciliation for weight shifting to allow for increased bil step length along with faciliation to advance RW continously to allow for increased gait speed. Pt with c/o's of knee pain after ~30 feet needing to sit down. for second gait rep utilized colored bands on bottom of walker with cues "step to the red" or "step to the yellow" to promote increased step length along with continued facilaition of weight  shifting and walker advancement.      Ambulation Distance (Feet) 30 Feet   x1, 25 x1   Assistive device Rolling walker;1 person hand held assist    Gait Pattern Step-to pattern;Decreased step length - right;Decreased step length - left;Decreased dorsiflexion - left;Decreased dorsiflexion - right;Right foot flat;Left foot flat;Poor foot clearance - left;Poor foot clearance - right;Shuffle;Step-through pattern;Decreased stance time - right;Decreased stride length    Ambulation Surface Level;Indoor      Neuro Re-ed    Neuro Re-ed Details  for balance/strengthening/motor planning/muscle re-ed: focused on stepping strategies with UE support on RW. Had pt alternating foot  taps to colored discs with cues to get foot fully onto the disc, not just toes, to promote increased step length. cues/facilation for posture, weight shifting and at times physical assist for stepping needed.  then had pt work on alternating stepping over yard stick and back with emphasis on increased step length and height. cues/facilitaiton needed for both as pt tended to land heel on yard stick and needed assist to fully clear heel over it. This did improve on the left LE as reps progressed. min to mod assist for balance with both activities.                 PT Short Term Goals - 03/19/20 1534      PT SHORT TERM GOAL #1   Title Pt will be independent with initial HEP in order to build upon functional gains made in therapy. ALL STGS DUE 03/14/20    Baseline 03/19/20: met with current program with CNA assistance.    Time --    Period --    Status Achieved    Target Date --      PT SHORT TERM GOAL #2   Title Pt will undergo further assessment of TUG with STG and LTG to be written as appropriate.    Baseline 03/19/20: 163.53 sec's (2 minutes, 43.53 sec's) scored today as baseline, LTG to be set by primary PT    Time --    Period --    Status Achieved      PT SHORT TERM GOAL #3   Title Pt will decr 5x sit <> stand time with single UE support to 45 seconds or less in order to demo improved BLE strength.    Baseline 03/09/20: 53.79 seconds as baseline, not reassesed on 03/19/20 as it was just done session prior to this one    Status Not Met      PT SHORT TERM GOAL #4   Title Pt will undergo further assessment of BERG with LTG to be written as appropriate.    Baseline 03/19/20: 23/56 scored today as baseline, primary PT to set LTGs    Time --    Period --    Status Achieved      PT SHORT TERM GOAL #5   Title Pt will ambulate at least 64' with RW with supervision in order to improve household mobility.    Baseline 03/19/20: 62 feet with RW with min guard assist. improved from 20 feet, just not  to goal    Time --    Period --    Status Not Met      PT SHORT TERM GOAL #6   Title Pt will perform stand pivot transfers and sit <> stands with RW with supervision in order to improve functional mobility.    Baseline 03/19/20: met in session today from RW to mat table    Time --  Period --    Status Achieved             PT Long Term Goals - 04/26/20 2115      PT LONG TERM GOAL #1   Title Pt will be independent with final HEP in order to build upon functional gains made in therapy. ALL LTGS DUE 05/24/20    Baseline 04/22/20: pt reports she has stopped doing her ex's at home the past few weeks and has not been walking at home with family    Time 4    Period Weeks    Status On-going    Target Date 05/24/20      PT LONG TERM GOAL #2   Title Pt will decrease TUG time to 3 minutes and 30 seconds or less with RW in order to demo decr fall risk.    Baseline 04/22/20: 5 minutes 59.25 sec's, increased from last assessment    Time 4    Period Weeks    Status Revised      PT LONG TERM GOAL #3   Title Pt will improve BERG score to at least a 28/56 in order to demo decr fall risk.    Baseline 04/22/20: 19/56, decreased from 23/56    Time 4    Period Weeks    Status Revised      PT LONG TERM GOAL #4   Title Pt will decr 5x sit <> stand time to 40 seconds or less from mat table with UE support in order to demo improved functional BLE strength.    Baseline 04/22/20: 51.56 sec's with UE support from standard height chair, improved from 53.79 sec's just not to goal    Time 4    Period Weeks    Status Revised      PT LONG TERM GOAL #6   Title Pt will perform all bed mobility with supervision in order to decr caregiver burden.    Baseline 04/22/20: unable to assess due to time constraints    Time 4    Period Weeks    Status On-going                 Plan - 04/29/20 1542    Clinical Impression Statement Today's skilled session continued to focus on stepping strategies to promote  weight shifting, increased step length and increased step height. Pt noted to have no anterior translation of her pelvis with gait, worked on this with NMR activites next to mat table with cues/faciliation needed, no carryover noted with second gait rep. Pt continues to have decreased, slow movements with all mobility. Pt continues to demo decreased motor planning as well with mobility. Pt was able to better participate with session with decreased rest breaks needed. The pt should benefit from continued PT to progress toward unmet goals.    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    Examination-Activity Limitations Bed Mobility;Bend;Locomotion Level;Stairs;Stand;Transfers;Squat    Examination-Participation Restrictions Community Activity    Stability/Clinical Decision Making Stable/Uncomplicated    Rehab Potential Good    PT Frequency 2x / week    PT Duration 4 weeks    PT Treatment/Interventions ADLs/Self Care Home Management;Gait training;DME Instruction;Stair training;Functional mobility training;Therapeutic activities;Neuromuscular re-education;Balance training;Therapeutic exercise;Patient/family education;Orthotic Fit/Training;Energy conservation;Passive range of motion    PT Next Visit Plan **pt needs to be scheduled out for 2 more weeks through novemeber. monitor BP.  any kind of LE strengthening. continue with nustep for stengthening/activity tolerance/working on increased speed with mobility, needs transfer/turning training with RW. stepping strategies to promote increased step length,    Consulted and Agree with Plan of Care Patient;Family member/caregiver    Family Member Consulted CNA [name]           Patient will benefit from skilled therapeutic intervention in order to improve the following deficits and impairments:  Abnormal gait,  Decreased balance, Decreased activity tolerance, Decreased coordination, Decreased endurance, Decreased knowledge of use of DME, Decreased range of motion, Difficulty walking, Decreased strength, Postural dysfunction  Visit Diagnosis: Unsteadiness on feet  Muscle weakness (generalized)  Other abnormalities of gait and mobility  Hemiplegia and hemiparesis following cerebral infarction affecting left non-dominant side [facility])     Problem List Patient Active Problem List   Diagnosis Date Noted  . Controlled type 2 diabetes mellitus with hyperglycemia, without long-term current use of insulin ([place]) 04/06/2020  . Abnormality of gait 02/03/2020  . Benign essential HTN   . CKD (chronic kidney disease), stage II   . Hemiparesis affecting left side as late effect of stroke ([place])   . Labile blood glucose   . Labile blood pressure   . Elevated BUN   . Hyponatremia   . Diabetic peripheral neuropathy ([place])   . Right middle cerebral artery stroke ([place]) 01/14/2020  . Dyslipidemia   . Class 1 obesity due to excess calories with serious comorbidity and body mass index (BMI) of 30.0 to 30.9 in adult   . Left hemiparesis ([place])   . Controlled type 2 diabetes mellitus with hyperglycemia ([place])   . Morbid obesity ([place])   . Leukocytosis   . History of TIA (transient ischemic attack)   . Acute CVA (cerebrovascular accident) ([place]) 01/06/2020  . Acute ischemic stroke ([place]) 01/05/2020  . Hypertensive emergency 01/26/2017  . TIA (transient ischemic attack) 01/26/2017  . Hypothyroidism 01/26/2017  . Aphasia   . Essential hypertension   . Diabetes mellitus without complication ([place])     Willow Ora, PTA, [place] [address] [place], [place] [address] [phone] 04/30/20, 4:59 PM   Name: Jill Rose MRN: [id number] Date of Birth: April 16, 1941

## 2020-05-04 ENCOUNTER — Encounter: Payer: Medicare HMO | Admitting: Speech Pathology

## 2020-05-04 ENCOUNTER — Ambulatory Visit: Payer: Medicare HMO | Admitting: Neurology

## 2020-05-04 ENCOUNTER — Encounter: Payer: Medicare HMO | Admitting: Occupational Therapy

## 2020-05-04 ENCOUNTER — Ambulatory Visit: Payer: Medicare HMO | Admitting: Physical Therapy

## 2020-05-04 ENCOUNTER — Other Ambulatory Visit: Payer: Self-pay

## 2020-05-04 ENCOUNTER — Encounter: Payer: Self-pay | Admitting: Neurology

## 2020-05-04 VITALS — BP 137/73 | HR 89 | Ht 61.0 in | Wt 161.0 lb

## 2020-05-04 DIAGNOSIS — F0150 Vascular dementia without behavioral disturbance: Secondary | ICD-10-CM

## 2020-05-04 DIAGNOSIS — R413 Other amnesia: Secondary | ICD-10-CM | POA: Diagnosis not present

## 2020-05-04 DIAGNOSIS — F40298 Other specified phobia: Secondary | ICD-10-CM | POA: Diagnosis not present

## 2020-05-04 MED ORDER — MEMANTINE HCL 28 X 5 MG & 21 X 10 MG PO TABS: ORAL_TABLET | ORAL | 12 refills | Status: DC

## 2020-05-04 NOTE — Patient Instructions (Signed)
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  We went over results of recent lab work and EEG and answered questions.  I recommend a trial of Namenda starter pack to improve her cognition as well as encourage her to work with physical and occupational therapist and to use a walker at all times and encouraged her to walk.  We also discussed fall safety precautions.  She was advised to follow-up with primary care physician for optimal treatment of her thyroid dysfunction treated she will return for follow-up in the future in 2 months or call earlier if necessary.  Fall Prevention in the Home, Adult Falls can cause injuries. They can happen to people of all ages. There are many things you can do to make your home safe and to help prevent falls. Ask for help when making these changes, if needed. What actions can I take to prevent falls? General Instructions  Use good lighting in all rooms. Replace any light bulbs that burn out.  Turn on the lights when you go into a dark area. Use night-lights.  Keep items that you use often in easy-to-reach places. Lower the shelves around your home if necessary.  Set up your furniture so you have a clear path. Avoid moving your furniture around.  Do not have throw rugs and other things on the floor that can make you trip.  Avoid walking on wet floors.  If any of your floors are uneven, fix them.  Add color or contrast paint or tape to clearly mark and help you see: ? Any grab bars or handrails. ? First and last steps of stairways. ? Where the edge of each step is.  If you use a stepladder: ? Make sure that it is fully opened. Do not climb a closed stepladder. ? Make sure that both sides of the stepladder are locked into place. ? Ask someone to hold the stepladder for you while you use it.  If there are any pets  around you, be aware of where they are. What can I do in the bathroom?      Keep the floor dry. Clean up any water that spills onto the floor as soon as it happens.  Remove soap buildup in the tub or shower regularly.  Use non-skid mats or decals on the floor of the tub or shower.  Attach bath mats securely with double-sided, non-slip rug tape.  If you need to sit down in the shower, use a plastic, non-slip stool.  Install grab bars by the toilet and in the tub and shower. Do not use towel bars as grab bars. What can I do in the bedroom?  Make sure that you have a light by your bed that is easy to reach.  Do not use any sheets or blankets that are too big for your bed. They should not hang down onto the floor.  Have a firm chair that has side arms. You can use this for support while you get dressed. What can I do in the kitchen?  Clean up any spills right away.  If you need to reach something above you, use a strong step stool that has a grab bar.  Keep electrical cords out of the way.  Do not use floor polish or wax that makes floors slippery. If you must use wax, use non-skid floor wax. What can I do  with my stairs?  Do not leave any items on the stairs.  Make sure that you have a light switch at the top of the stairs and the bottom of the stairs. If you do not have them, ask someone to add them for you.  Make sure that there are handrails on both sides of the stairs, and use them. Fix handrails that are broken or loose. Make sure that handrails are as long as the stairways.  Install non-slip stair treads on all stairs in your home.  Avoid having throw rugs at the top or bottom of the stairs. If you do have throw rugs, attach them to the floor with carpet tape.  Choose a carpet that does not hide the edge of the steps on the stairway.  Check any carpeting to make sure that it is firmly attached to the stairs. Fix any carpet that is loose or worn. What can I do on the  outside of my home?  Use bright outdoor lighting.  Regularly fix the edges of walkways and driveways and fix any cracks.  Remove anything that might make you trip as you walk through a door, such as a raised step or threshold.  Trim any bushes or trees on the path to your home.  Regularly check to see if handrails are loose or broken. Make sure that both sides of any steps have handrails.  Install guardrails along the edges of any raised decks and porches.  Clear walking paths of anything that might make someone trip, such as tools or rocks.  Have any leaves, snow, or ice cleared regularly.  Use sand or salt on walking paths during winter.  Clean up any spills in your garage right away. This includes grease or oil spills. What other actions can I take?  Wear shoes that: ? Have a low heel. Do not wear high heels. ? Have rubber bottoms. ? Are comfortable and fit you well. ? Are closed at the toe. Do not wear open-toe sandals.  Use tools that help you move around (mobility aids) if they are needed. These include: ? Canes. ? Walkers. ? Scooters. ? Crutches.  Review your medicines with your doctor. Some medicines can make you feel dizzy. This can increase your chance of falling. Ask your doctor what other things you can do to help prevent falls. Where to find more information  Centers for Disease Control and Prevention, STEADI: HealthcareCounselor.com.pt  [employer] on Aging: RingConnections.si Contact a doctor if:  You are afraid of falling at home.  You feel weak, drowsy, or dizzy at home.  You fall at home. Summary  There are many simple things that you can do to make your home safe and to help prevent falls.  Ways to make your home safe include removing tripping hazards and installing grab bars in the bathroom.  Ask for help when making these changes in your home. This information is not intended to replace advice given to you by your health care provider.  Make sure you discuss any questions you have with your health care provider. Document Revised: 10/24/2018 Document Reviewed: 02/15/2017 Elsevier Patient Education  2020 [employer].

## 2020-05-04 NOTE — Progress Notes (Signed)
[facility] [address] [place]. [place] [address] [phone]       OFFICE FOLLOW-UP NOTE  [name] Date of Birth:  09/23/1940 Medical Record Number:  [id number]   HPI: Initial visit 04/08/2020 Jill Rose is a 79 year old [place] origin lady who is seen for first office follow-up visit following hospital consultation for stroke.  She is accompanied by her daughter.  History is obtained from them, review of electronic medical records and I personally reviewed pertinent available imaging films in PACS.  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  She presented on 01/05/2020 to [facility] with left-sided weakness which started a week prior to presentation.  She was not able to tell initially whether this was due to her arthritis or a stroke.  She continued to have left-sided problems and getting in and out of bed in chair and also had some slurred speech eventually prompting hospital visit.  CT scan of the head showed no acute abnormality but old left basal ganglia lacunar infarct.  MRI scan confirmed acute right corona radiata and posterior right temporal and occipital white matter infarcts.  There is also old bilateral corona radiata, left thalamus and left paramedian pontine lacunar infarcts.  MRI of the brain showed multifocal intracranial atherosclerotic changes and MRA of the neck showed 25% right ICA and left vertebral artery atherosclerotic multifocal narrowing.  2D echo showed normal ejection fraction without cardiac source of embolism.  LDL cholesterol of 196 mg percent and hemoglobin A1c was 7.6.  Patient was started on dual antiplatelet therapy aspirin Plavix for 3 months and was transferred to inpatient rehab.  She is finished rehab stay is currently living at home.  She is doing outpatient physical occupational therapy.  She is able to walk with a walker.  She is  shown some progressive improvement and can get in and out of her couch in bed by herself.  She still has mild residual left-sided weakness but it is improving.  The family feels that the patient is depressed she gets frustrated easily and she feels she is very poor stamina.  He saw primary care physician last week and she was prescribed Lexapro but she has not yet started it.  She is living at home with her husband.  They have a CNA who comes in for few hours every day.  Patient's husband is also having cognitive issues and daughter is concerned about the long-term care.  Patient also notices increased forgetfulness and short-term memory difficulties.  She has not yet had any lab work for evaluation for reversible causes. Update 05/04/2020 : She returns for follow-up after last visit a month ago.  She is accompanied by her son and husband.  They feel there has been slight regression in her memory and cognitive issues.  She keeps on asking the same question over and over again.  She has poor appetite and has low energy level.  She has regressed with her walking and refuses to walk and has extreme fear of falling.  She is getting some home physical and occupational therapy and gets up only with assistance.  She did undergo lab work at last visit and B12, RPR and homocystine were normal.  TSH was slightly elevated and she has seen her primary care physician since who has adjusted her thyroid medications.  EEG showed mild generalized slowing with 6 to 7 Hz theta activity throughout.  No  definite epileptiform activity was noted.  She is still has some dragging of the right foot for walking and is scared that she will fall.  She does have a walker but does not use it a lot.  She is tolerating Plavix well without bruising or bleeding.  Blood pressures well controlled today it is 137/72.  Last hemoglobin A1c was 7.3 and fasting sugars are usually in the 150s.  She has no family history of dementia.  She has never tried  Aricept or Namenda like medications but is willing to try them now.  She was seen in the ER briefly a few weeks ago for transient confusion which resolved and she left without any significant work-up being done. ROS:   14 system review of systems is positive for memory loss, fear of walking, depression, frustration, irritability, weakness, gait imbalance, ringing sound in the ears, discomfort all other systems negative PMH:  Past Medical History:  Diagnosis Date  . Diabetes mellitus without complication (HCC)   . Essential hypertension   . High cholesterol   . Hypothyroidism   . Stroke (HCC)   . TIA (transient ischemic attack) 2018    Social History:  Social History   Socioeconomic History  . Marital status: Married    Spouse name: Not on file  . Number of children: Not on file  . Years of education: Not on file  . Highest education level: Not on file  Occupational History  . Not on file  Tobacco Use  . Smoking status: Never Smoker  . Smokeless tobacco: Never Used  Substance and Sexual Activity  . Alcohol use: No  . Drug use: No  . Sexual activity: Not on file  Other Topics Concern  . Not on file  Social History Narrative   Lives with spouse   Right Handed   Drinks 1 cup caffeine/daily   Social Determinants of Health   Financial Resource Strain:   . Difficulty of Paying Living Expenses: Not on file  Food Insecurity:   . Worried About Programme researcher, broadcasting/film/video in the Last Year: Not on file  . Ran Out of Food in the Last Year: Not on file  Transportation Needs:   . Lack of Transportation (Medical): Not on file  . Lack of Transportation (Non-Medical): Not on file  Physical Activity:   . Days of Exercise per Week: Not on file  . Minutes of Exercise per Session: Not on file  Stress:   . Feeling of Stress : Not on file  Social Connections:   . Frequency of Communication with Friends and Family: Not on file  . Frequency of Social Gatherings with Friends and Family: Not on  file  . Attends Religious Services: Not on file  . Active Member of Clubs or Organizations: Not on file  . Attends Banker Meetings: Not on file  . Marital Status: Not on file  Intimate Partner Violence:   . Fear of Current or Ex-Partner: Not on file  . Emotionally Abused: Not on file  . Physically Abused: Not on file  . Sexually Abused: Not on file    Medications:   Current Outpatient Medications on File Prior to Visit  Medication Sig Dispense Refill  . acetaminophen (TYLENOL) 325 MG tablet Take 2 tablets (650 mg total) by mouth every 4 (four) hours as needed for mild pain (or temp > 37.5 C (99.5 F)).    Marland Kitchen b complex vitamins tablet Take 1 tablet by mouth daily with lunch.    Marland Kitchen  Barberry-Oreg Grape-Goldenseal (BERBERINE COMPLEX PO) Take 1 tablet by mouth daily with lunch.     . Cholecalciferol (VITAMIN D3 PO) Take 1 tablet by mouth daily with lunch.     . clopidogrel (PLAVIX) 75 MG tablet Take 1 tablet (75 mg total) by mouth daily. 30 tablet 2  . Cyanocobalamin (VITAMIN B-12 PO) Take 1 tablet by mouth daily with lunch.    . escitalopram (LEXAPRO) 5 MG tablet Take 5 mg by mouth every morning.    . hydrALAZINE (APRESOLINE) 10 MG tablet Take 1 tablet (10 mg total) by mouth at bedtime. 30 tablet 0  . levothyroxine (SYNTHROID) 88 MCG tablet Take 1 tablet (88 mcg total) by mouth daily before breakfast. 30 tablet 0  . telmisartan-hydrochlorothiazide (MICARDIS HCT) 80-12.5 MG tablet Take 1 tablet by mouth daily.    Marland Kitchen amLODipine (NORVASC) 10 MG tablet Take 1 tablet (10 mg total) by mouth daily. 30 tablet 0  . atorvastatin (LIPITOR) 80 MG tablet Take 1 tablet (80 mg total) by mouth daily. 90 tablet 0  . metFORMIN (GLUCOPHAGE) 500 MG tablet Take 1 tablet (500 mg total) by mouth 2 (two) times daily with a meal. 60 tablet 0   No current facility-administered medications on file prior to visit.    Allergies:   Allergies  Allergen Reactions  . Ammonia Shortness Of Breath, Itching,  Swelling and Other (See Comments)    Confusion, Headache    . Peroxide [Hydrogen Peroxide] Shortness Of Breath, Itching, Swelling and Other (See Comments)    Confusion, Headache      Physical Exam General: well developed, well nourished elderly [place] lady, seated, in no evident distress Head: head normocephalic and atraumatic.  Neck: supple with no carotid or supraclavicular bruits Cardiovascular: regular rate and rhythm, no murmurs Musculoskeletal: no deformity Skin:  no rash/petichiae Vascular:  Normal pulses all extremities Vitals:   05/04/20 1514  BP: 137/73  Pulse: 89   Neurologic Exam Mental Status: Awake and fully alert. Oriented to place and time. Recent and remote memory intact. Attention span, concentration and fund of knowledge appropriate. Mood and affect appropriate.  Diminished recall 0/3.  Able to name only 5 animals that can walk on 4 legs.  Difficulty in copying intersecting pentagons.  Clock drawing 4/4. Cranial Nerves: Fundoscopic exam not done. Pupils equal, briskly reactive to light. Extraocular movements full without nystagmus. Visual fields full to confrontation. Hearing intact. Facial sensation intact. Face, tongue, palate moves normally and symmetrically.  Motor: Normal bulk and tone. Normal strength in all tested extremity muscles except mild left hemiparesis 4+/5 strength with weakness of left grip and intrinsic hand muscles.  Orbits right over left upper extremity.  Diminished fine finger movements and foot tapping on the left.. Sensory.: intact to touch ,pinprick .position and vibratory sensation.  Coordination: Mildly impaired finger-to-nose and needle coordination on the left. Gait and Station: Deferred as she did not bring her walker and she is in a wheelchair. Reflexes: 2+ and asymmetric and brisker on the left. Toes downgoing.   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  We went over results of recent lab work and EEG and answered questions.  I recommend a trial of Namenda starter pack to improve her cognition as well as encourage her to work with physical and occupational therapist and to use a walker at all times and encouraged her to walk.  We also discussed fall safety precautions.  She was advised to follow-up with primary care physician for optimal treatment of her thyroid dysfunction treated she will return for follow-up in the future in 2 months or call earlier if necessary. Greater than 50% of time during this 25 minute visit was spent on counseling,explanation of diagnosis of stroke and memory loss, planning of further management, discussion with patient and family and coordination of care Delia Heady, MD  [facility] [address] [place], [place] [address]  Phone [phone] Fax [phone] Note: This document was prepared with digital dictation and possible smart phrase technology. Any transcriptional errors that result from this process are unintentional

## 2020-05-06 ENCOUNTER — Ambulatory Visit: Payer: Medicare HMO | Admitting: Speech Pathology

## 2020-05-06 ENCOUNTER — Ambulatory Visit: Payer: Medicare HMO | Admitting: Occupational Therapy

## 2020-05-06 ENCOUNTER — Ambulatory Visit: Payer: Medicare HMO | Admitting: Physical Therapy

## 2020-05-11 ENCOUNTER — Ambulatory Visit: Payer: Medicare HMO | Admitting: Speech Pathology

## 2020-05-11 ENCOUNTER — Other Ambulatory Visit: Payer: Self-pay

## 2020-05-11 ENCOUNTER — Ambulatory Visit: Payer: Medicare HMO | Admitting: Physical Therapy

## 2020-05-11 ENCOUNTER — Ambulatory Visit: Payer: Medicare HMO | Admitting: Occupational Therapy

## 2020-05-11 ENCOUNTER — Encounter: Payer: Self-pay | Admitting: Occupational Therapy

## 2020-05-11 VITALS — BP 125/60 | HR 86

## 2020-05-11 VITALS — BP 154/83 | HR 93

## 2020-05-11 DIAGNOSIS — R278 Other lack of coordination: Secondary | ICD-10-CM

## 2020-05-11 DIAGNOSIS — R29818 Other symptoms and signs involving the nervous system: Secondary | ICD-10-CM

## 2020-05-11 DIAGNOSIS — R2681 Unsteadiness on feet: Secondary | ICD-10-CM

## 2020-05-11 DIAGNOSIS — M6281 Muscle weakness (generalized): Secondary | ICD-10-CM

## 2020-05-11 DIAGNOSIS — I69354 Hemiplegia and hemiparesis following cerebral infarction affecting left non-dominant side: Secondary | ICD-10-CM

## 2020-05-11 DIAGNOSIS — R41841 Cognitive communication deficit: Secondary | ICD-10-CM

## 2020-05-11 NOTE — Therapy (Signed)
[facility] [address] [place], [place], [address] Phone: [phone]   Fax:  [phone]  Occupational Therapy Treatment  Patient Details  Name: Jill Rose MRN: [id number] Date of Birth: [birth year] No data recorded  Encounter Date: 05/11/2020   OT End of Session - 05/11/20 1509    Visit Number 9    Number of Visits 17    Date for OT Re-Evaluation 05/16/20    Authorization Type [employer] MCR    Authorization - Visit Number 8    Authorization - Number of Visits 10    Progress Note Due on Visit 10    OT Start Time 1403    OT Stop Time 1445    OT Time Calculation (min) 42 min    Activity Tolerance Patient limited by lethargy    Behavior During Therapy Anxious           Past Medical History:  Diagnosis Date  . Diabetes mellitus without complication (HCC)   . Essential hypertension   . High cholesterol   . Hypothyroidism   . Stroke (HCC)   . TIA (transient ischemic attack) 2018    History reviewed. No pertinent surgical history.  Vitals:   05/11/20 1412  BP: (!) 154/83  Pulse: 93     Subjective Assessment - 05/11/20 1412    Subjective  Patient indicates that she had a fall when trying to walk with walker.  Had to call son to help pick her up off floor    Pertinent History Rt MCA CVA 01/05/20. PMH: HTN, HLD, T2DM, Diabetic neuropathy    Limitations no driving, fall risk, HOH    Patient Stated Goals return to cooking    Currently in Pain? No/denies    Pain Score 0-No pain                        OT Treatments/Exercises (OP) - 05/11/20 0001      ADLs   Functional Mobility Worked on functional sit t stand, and stand step transfers.  Patient needs facilitation to shift weight onto LLE to advance RLE.  With cueing able to step right.  Patient very fearful and reliant on UE's for stand balance.  Worked on  components of sit to stand with emphasis on forward weight shift.  Patient with  repetition able to stand without use of arms with only touch assist.  Able to return to seated, without assistance in controlled fashion.                  OT Education - 05/11/20 1508    Education Details Encouraged patient to attempt to participate in PT - She is convinced it is too much for her    Person(s) Educated Patient;Caregiver(s)    Methods Explanation    Comprehension Need further instruction            OT Short Term Goals - 05/11/20 1511      OT SHORT TERM GOAL #1   Title Independent with coordination HEP Lt hand    Time 4    Period Weeks    Status On-going   Issued coordination HEP 03/19/20     OT SHORT TERM GOAL #2   Title Pt to perform UE dressing with min assist or less and LE dressing with mod assist or less    Time 4    Period Weeks    Status Achieved   Pt reports doing approximately 50%  of UB dressing 03/19/20     OT SHORT TERM GOAL #3   Title Pt to perform simple snack prep/cold meal prep standing level with DME and supervision prn    Time 4    Period Weeks    Status Deferred   Pt reports assisting with cutting vegetables with husband 03/19/20 - seated.     OT SHORT TERM GOAL #4   Title Pt to perform sustained high level reaching LUE for endurance    Time 4    Period Weeks    Status Achieved      OT SHORT TERM GOAL #5   Title Pt to return to putting up/styling hair mod I level    Time 4    Period Weeks    Status On-going   Pt reports needing assistance for clasps and barettes            OT Long Term Goals - 04/22/20 1514      OT LONG TERM GOAL #1   Title Independent with strengthening HEP for LUE    Time 8    Period Weeks    Status New      OT LONG TERM GOAL #2   Title Improve coordination Lt hand as evidenced by performing 9 hole peg test in 41 sec or less    Baseline 48.59 sec    Time 8    Period Weeks    Status New      OT LONG TERM GOAL #3   Title Pt to be mod I level with all BADLS    Time 8    Period Weeks    Status New       OT LONG TERM GOAL #4   Title Pt to cook simple meal with DME prn and distant supervision    Time 8    Period Weeks    Status New      OT LONG TERM GOAL #5   Title Pt to perform light cleaning tasks (washing dishes, folding laundry, etc) mod I level    Time 8    Period Weeks    Status New                 Plan - 05/11/20 1510    Clinical Impression Statement Pt limited by decreased cognition - attention and memory, and anxiety.  Does well with repetition    OT Occupational Profile and History Detailed Assessment- Review of Records and additional review of physical, cognitive, psychosocial history related to current functional performance    Occupational performance deficits (Please refer to evaluation for details): ADL's;IADL's    Body Structure / Function / Physical Skills ADL;ROM;Balance;Body mechanics;Mobility;Flexibility;Strength;Coordination;FMC;UE functional use;Decreased knowledge of use of DME    Rehab Potential Good    Clinical Decision Making Several treatment options, min-mod task modification necessary    Comorbidities Affecting Occupational Performance: May have comorbidities impacting occupational performance    Modification or Assistance to Complete Evaluation  Min-Moderate modification of tasks or assist with assess necessary to complete eval    OT Frequency 2x / week    OT Duration 8 weeks   Plus eval   OT Treatment/Interventions Self-care/ADL training;Therapeutic exercise;Functional Mobility Training;Neuromuscular education;Manual Therapy;Therapeutic activities;Coping strategies training;DME and/or AE instruction;Cognitive remediation/compensation;Moist Heat;Passive range of motion;Patient/family education    Plan Check remaining STG's, PN/Recert.  work on wt shifts in standing, functional transfers, and ADL    Consulted and Agree with Plan of Care Patient;Family member/caregiver    Family Member Consulted caregiver  CNA           Patient will benefit  from skilled therapeutic intervention in order to improve the following deficits and impairments:   Body Structure / Function / Physical Skills: ADL, ROM, Balance, Body mechanics, Mobility, Flexibility, Strength, Coordination, FMC, UE functional use, Decreased knowledge of use of DME       Visit Diagnosis: Hemiplegia and hemiparesis following cerebral infarction affecting left non-dominant side (HCC)  Unsteadiness on feet  Other lack of coordination  Muscle weakness (generalized)  Other symptoms and signs involving the nervous system    Problem List Patient Active Problem List   Diagnosis Date Noted  . Controlled type 2 diabetes mellitus with hyperglycemia, without long-term current use of insulin (HCC) 04/06/2020  . Abnormality of gait 02/03/2020  . Benign essential HTN   . CKD (chronic kidney disease), stage II   . Hemiparesis affecting left side as late effect of stroke (HCC)   . Labile blood glucose   . Labile blood pressure   . Elevated BUN   . Hyponatremia   . Diabetic peripheral neuropathy (HCC)   . 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 Dyslipidemia   . Class 1 obesity due to excess calories with serious comorbidity and body mass index (BMI) of 30.0 to 30.9 in adult   . Left hemiparesis (HCC)   . Controlled type 2 diabetes mellitus with hyperglycemia (HCC)   . Morbid obesity (HCC)   . Leukocytosis   . History of TIA (transient ischemic attack)   . Acute CVA (cerebrovascular accident) (HCC) 01/06/2020  . Acute ischemic stroke (HCC) 01/05/2020  . Hypertensive emergency 01/26/2017  . TIA (transient ischemic attack) 01/26/2017  . Hypothyroidism 01/26/2017  . Aphasia   . Essential hypertension   . Diabetes mellitus without complication [facility])     Collier Salina, OTR/L 05/11/2020, 3:15 PM  [place] [address] [place], [place], [address] Phone: [phone]   Fax:   [phone]  Name: Jill Rose MRN: [id number] Date of Birth: 1941-06-02

## 2020-05-11 NOTE — Therapy (Signed)
[place] [address] [place], [place], [address] Phone: [phone]   Fax:  [phone]  Speech Language Pathology Treatment and Progress Note Patient Details  Name: Jill Rose MRN: [id number] Date of Birth: 30-Mar-1941 Referring Provider (SLP): Jamse Arn, MD   Encounter Date: 05/11/2020   End of Session - 05/11/20 1702    Visit Number 10    Number of Visits 17    Date for SLP Re-Evaluation 05/15/20    SLP Start Time 1533    SLP Stop Time  1615    SLP Time Calculation (min) 42 min    Activity Tolerance Patient tolerated treatment well           Past Medical History:  Diagnosis Date  . Diabetes mellitus without complication ([place])   . Essential hypertension   . High cholesterol   . Hypothyroidism   . Stroke ([place])   . TIA (transient ischemic attack) 2018    No past surgical history on file.  There were no vitals filed for this visit.   Subjective Assessment - 05/11/20 1655    Subjective Aide retrieved binder from car    Patient is accompained by: --   aide [name]   Currently in Pain? No/denies                 ADULT SLP TREATMENT - 05/11/20 1655      General Information   Behavior/Cognition Alert;Cooperative;Requires cueing;Distractible;Decreased sustained attention      Treatment Provided   Treatment provided Cognitive-Linquistic      Pain Assessment   Pain Assessment No/denies pain      Cognitive-Linquistic Treatment   Treatment focused on Cognition;Patient/family/caregiver education    Skilled Treatment Pt had completed one journal activity since last ST session. Today SLP educated aide on appropriately cuing pt for recall of her day and writing a journal entry, as aide overcuing, at times asking questions or giving hints in rapid succession. Pt required cues to verbalize phrases out loud prior to writing them in her journal, as well as cues for sustained attention, awareness  of errors, starting new lines in her entry. Pt could not recall names of OT and PT she spoke positively of at the beginning of session ~20 minutes later. SLP encouraged aide to ask pt to describe the person and to provide as many details as possible before giving the name or phonemic cue. SLP encouraged aide to have pt write her journal entry daily.       Assessment / Recommendations / Plan   Plan Continue with current plan of care      Progression Toward Goals   Progression toward goals Progressing toward goals            SLP Education - 05/11/20 1701    Education Details appropriate cuing for pt; slow down to avoid overstimulation, focus on one thing at a time    Person(s) Educated Patient;Caregiver(s)    Methods Explanation;Demonstration    Comprehension Verbalized understanding;Need further instruction            SLP Short Term Goals - 05/11/20 1704      SLP SHORT TERM GOAL #1   Title pt will *attempt to access* her memory system for simple organization of discipline-specific items, or for temporal orientation x5 sessions    Time 1    Period Weeks    Status Not Met      SLP SHORT TERM GOAL #2   Title  pt will demo knowledge of items in her memory system/notebook by correctly accessing them in 2 sessions    Time 1    Period Weeks    Status Not Met      SLP SHORT TERM GOAL #3   Title pt will demonstrate error awareness in therapy tasks 75% with min cues for re-check in 3 sessions    Time 1    Period Weeks    Status Not Met      SLP SHORT TERM GOAL #4   Title pt will name 8 items for simple categories x 3 sessions    Time 1    Period Weeks    Status Not Met            SLP Long Term Goals - 05/11/20 1704      SLP LONG TERM GOAL #1   Title pt will demo correct usage of a memory compensation system for appointment and/or logging blood pressure and blood sugar, daily schedules in 3 sessions    Time 2    Period Weeks    Status On-going      SLP LONG TERM GOAL #2    Title Pt will maintain selective attention in functional task for 8 minutes in mod noisy environment with rare min cues x 2 sessions.    Time 2    Period Weeks    Status On-going      SLP LONG TERM GOAL #3   Title pt will demo anticipatory awareness regarding deficit areas, independently, in 3 sessions    Time 2    Period Weeks    Status On-going      SLP LONG TERM GOAL #4   Title pt will use compensations for anomia in 8 minutes simple conversation x3 visits    Time 2    Period Weeks    Status On-going            Plan - 05/11/20 1702    Clinical Impression Statement Jill Rose presents with overall moderate cognitive communication deficits. Pt would benefit from audiological eval. Suspect attention, primarily, and slow processing contributes to this difficulty. Dr. 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 Prior to her CVA pt enjoyed cooking and reading about natural medicine/ ayurveda. I recommend skilled ST to address cognitive and language deficits for better communication with family and friends, to increase independence and improve quality of life.    Speech Therapy Frequency 2x / week    Duration --   8 weeks or 17 visits   Treatment/Interventions SLP instruction and feedback;Functional tasks;Cognitive reorganization;Compensatory strategies;Internal/external aids;Multimodal communcation approach;Patient/family education;Environmental controls;Language facilitation;Compensatory techniques    Potential to Achieve Goals Good    Consulted and Agree with Plan of Care Patient           Patient will benefit from skilled therapeutic intervention in order to improve the following deficits and impairments:   Cognitive communication deficit    Problem List Patient Active Problem List   Diagnosis Date Noted  . Controlled type 2 diabetes mellitus with hyperglycemia, without long-term current use of insulin ([place]) 04/06/2020  . Abnormality of  gait 02/03/2020  . Benign essential HTN   . CKD (chronic kidney disease), stage II   . Hemiparesis affecting left side as late effect of stroke ([place])   . Labile blood glucose   . Labile blood pressure   . Elevated BUN   . Hyponatremia   . Diabetic peripheral neuropathy ([place])   .  Right middle cerebral artery stroke ([place]) 01/14/2020  . Dyslipidemia   . Class 1 obesity due to excess calories with serious comorbidity and body mass index (BMI) of 30.0 to 30.9 in adult   . Left hemiparesis ([place])   . Controlled type 2 diabetes mellitus with hyperglycemia ([place])   . Morbid obesity ([place])   . Leukocytosis   . History of TIA (transient ischemic attack)   . Acute CVA (cerebrovascular accident) ([place]) 01/06/2020  . Acute ischemic stroke ([place]) 01/05/2020  . Hypertensive emergency 01/26/2017  . TIA (transient ischemic attack) 01/26/2017  . Hypothyroidism 01/26/2017  . Aphasia   . Essential hypertension   . Diabetes mellitus without complication [facility])     Speech Therapy Progress Note  Dates of Reporting Period:  03/16/20 to 05/11/20  Pt has been seen for 10 speech therapy sessions targeting cognitive deficits. She is progressing slowly toward goals however requires frequent cuing and assistance with routine due to severity of deficits; regular attendance over the past 4-5 sessions appears to have improved carryover somewhat. As MD notes possible vascular dementia, may consider downgrading LTGs.    Deneise Lever, [place], CCC-SLP Speech-Language Pathologist  Aliene Altes 05/11/2020, 5:04 PM  [place] [address] [place], [place], [address] Phone: [phone]   Fax:  [phone]   Name: Jill Rose MRN: [id number] Date of Birth: 08-16-1940

## 2020-05-11 NOTE — Therapy (Signed)
[facility] [address] [place], [place], [address] Phone: [phone]   Fax:  [phone]  Physical Therapy Treatment  Patient Details  Name: Jill Rose MRN: [id number] Date of Birth: 1941-03-22 Referring Provider (PT): Fortino Sic, [place] (being followed by Dr. Posey Pronto)   Encounter Date: 05/11/2020   PT End of Session - 05/11/20 1625    Visit Number 13    Number of Visits 18    Date for PT Re-Evaluation 06/25/20   written for 4 week POC   Authorization Type [employer]- $35 co pay per day regardless of discipline    PT Start Time 1446    PT Stop Time 1530    PT Time Calculation (min) 44 min    Equipment Utilized During Treatment Gait belt    Activity Tolerance Patient tolerated treatment well;Patient limited by fatigue    Behavior During Therapy [facility] for tasks assessed/performed           Past Medical History:  Diagnosis Date   Diabetes mellitus without complication ([place])    Essential hypertension    High cholesterol    Hypothyroidism    Stroke [facility])    TIA (transient ischemic attack) 2018    No past surgical history on file.  Vitals:   05/11/20 1518  BP: 125/60  Pulse: 86     Subjective Assessment - 05/11/20 1448    Subjective Has been working on doing more walking at home. BP was good with OT. Reports having a recent fall with the walker, was going from the bedroom to the den and fell down - states that she just lost her balance. Her son had to help pick her up.    Patient is accompained by: Family member   CNA   Pertinent History diabetes mellitus, HTN, hypothyroidism, TIA (2018)    Patient Stated Goals wants to be walking more.    Currently in Pain? No/denies    Pain Onset More than a month ago                             [facility] Adult PT Treatment/Exercise - 05/11/20 1508      Transfers   Transfers Sit to Stand;Stand to Sit    Sit to Stand 4: Min guard;4:  Min assist;With upper extremity assist;From bed;From chair/3-in-1;3: Mod assist    Sit to Stand Details Tactile cues for weight shifting;Verbal cues for sequencing;Verbal cues for technique;Verbal cues for safe use of DME/AE;Manual facilitation for weight shifting;Manual facilitation for placement    Sit to Stand Details (indicate cue type and reason) max verbal cues and manual cues for proper UE placement on mat/transport chair and RW to stand, pt needing min/mod A to stand from lower transport chair - needing cues for forward weight shift and counting to 3 to come up to stand. once in standing pt with tendency to have weight back on heels, verbal and manual cues for weight shifting onto toes    Stand to Sit 4: Min guard;4: Min assist;With upper extremity assist    Stand to Sit Details (indicate cue type and reason) Tactile cues for weight shifting;Tactile cues for sequencing;Verbal cues for sequencing;Verbal cues for technique;Verbal cues for safe use of DME/AE;Manual facilitation for weight shifting      Ambulation/Gait   Ambulation/Gait Yes    Ambulation/Gait Assistance 4: Min guard;4: Min assist    Ambulation/Gait Assistance Details cues/facilitation for weight shifting to  try to incr step length, pt still with more shuffled steps, also tried using a colored theraband at front of the RW with cues for pt to step to each color, however, pt still demonstrating decr step length and slowed gait speed. therapist also assisting with RW propulsion. needing seated rest break between each bout (pt's CNA followed with transport chair)    Ambulation Distance (Feet) 15 Feet   x1, 11 x 1   Assistive device Rolling walker    Gait Pattern Step-to pattern;Decreased step length - right;Decreased step length - left;Decreased dorsiflexion - left;Decreased dorsiflexion - right;Right foot flat;Left foot flat;Poor foot clearance - left;Poor foot clearance - right;Shuffle;Step-through pattern;Decreased stance time -  right;Decreased stride length    Ambulation Surface Level;Indoor      Exercises   Exercises Other Exercises    Other Exercises  With use of boomwhacker as visual cue on how high for pt to kick/march performed seated LAQs x10 reps B and additional x10 reps B with 2# ankle weight, seated marching x10 reps B and additional x10 reps B with 2# ankle weight, heel <> toe raises x10 reps with 2# ankle weight                    PT Short Term Goals - 03/19/20 1534      PT SHORT TERM GOAL #1   Title Pt will be independent with initial HEP in order to build upon functional gains made in therapy. ALL STGS DUE 03/14/20    Baseline 03/19/20: met with current program with CNA assistance.    Time --    Period --    Status Achieved    Target Date --      PT SHORT TERM GOAL #2   Title Pt will undergo further assessment of TUG with STG and LTG to be written as appropriate.    Baseline 03/19/20: 163.53 sec's (2 minutes, 43.53 sec's) scored today as baseline, LTG to be set by primary PT    Time --    Period --    Status Achieved      PT SHORT TERM GOAL #3   Title Pt will decr 5x sit <> stand time with single UE support to 45 seconds or less in order to demo improved BLE strength.    Baseline 03/09/20: 53.79 seconds as baseline, not reassesed on 03/19/20 as it was just done session prior to this one    Status Not Met      PT SHORT TERM GOAL #4   Title Pt will undergo further assessment of BERG with LTG to be written as appropriate.    Baseline 03/19/20: 23/56 scored today as baseline, primary PT to set LTGs    Time --    Period --    Status Achieved      PT SHORT TERM GOAL #5   Title Pt will ambulate at least 40' with RW with supervision in order to improve household mobility.    Baseline 03/19/20: 62 feet with RW with min guard assist. improved from 20 feet, just not to goal    Time --    Period --    Status Not Met      PT SHORT TERM GOAL #6   Title Pt will perform stand pivot transfers and  sit <> stands with RW with supervision in order to improve functional mobility.    Baseline 03/19/20: met in session today from RW to mat table    Time --  Period --    Status Achieved             PT Long Term Goals - 04/26/20 2115      PT LONG TERM GOAL #1   Title Pt will be independent with final HEP in order to build upon functional gains made in therapy. ALL LTGS DUE 05/24/20    Baseline 04/22/20: pt reports she has stopped doing her ex's at home the past few weeks and has not been walking at home with family    Time 4    Period Weeks    Status On-going    Target Date 05/24/20      PT LONG TERM GOAL #2   Title Pt will decrease TUG time to 3 minutes and 30 seconds or less with RW in order to demo decr fall risk.    Baseline 04/22/20: 5 minutes 59.25 sec's, increased from last assessment    Time 4    Period Weeks    Status Revised      PT LONG TERM GOAL #3   Title Pt will improve BERG score to at least a 28/56 in order to demo decr fall risk.    Baseline 04/22/20: 19/56, decreased from 23/56    Time 4    Period Weeks    Status Revised      PT LONG TERM GOAL #4   Title Pt will decr 5x sit <> stand time to 40 seconds or less from mat table with UE support in order to demo improved functional BLE strength.    Baseline 04/22/20: 51.56 sec's with UE support from standard height chair, improved from 53.79 sec's just not to goal    Time 4    Period Weeks    Status Revised      PT LONG TERM GOAL #6   Title Pt will perform all bed mobility with supervision in order to decr caregiver burden.    Baseline 04/22/20: unable to assess due to time constraints    Time 4    Period Weeks    Status On-going                 Plan - 05/11/20 1626    Clinical Impression Statement Pt received seated on mat table today after OT session - pt reporting feeling tired and didn't feel like she could do much today. BP WFL for today's therapy session. Focus of today was gait and transfer  training with RW and seated BLE strengthening. Pt needing seated rest breaks due to fatigue. Pt needing incr assist to stand from lower transport chair with RW and still needs max verbal/demo cues for proper UE placement for safety. Pt continues to have slow movements with all mobility. Will continue to progress towards LTGs.    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    Examination-Activity Limitations Bed Mobility;Bend;Locomotion Level;Stairs;Stand;Transfers;Squat    Examination-Participation Restrictions Community Activity    Stability/Clinical Decision Making Stable/Uncomplicated    Rehab Potential Good    PT Frequency 2x / week    PT Duration 4 weeks    PT Treatment/Interventions ADLs/Self Care Home Management;Gait training;DME Instruction;Stair training;Functional mobility training;Therapeutic activities;Neuromuscular re-education;Balance training;Therapeutic exercise;Patient/family education;Orthotic Fit/Training;Energy conservation;Passive range of motion    PT Next Visit Plan monitor BP. any kind of LE strengthening. continue with nustep for stengthening/activity tolerance/working on increased speed with mobility, needs transfer/turning training  with RW. stepping strategies to promote increased step length,    Consulted and Agree with Plan of Care Patient;Family member/caregiver    Family Member Consulted CNA [name]           Patient will benefit from skilled therapeutic intervention in order to improve the following deficits and impairments:  Abnormal gait, Decreased balance, Decreased activity tolerance, Decreased coordination, Decreased endurance, Decreased knowledge of use of DME, Decreased range of motion, Difficulty walking, Decreased strength, Postural dysfunction  Visit Diagnosis: Unsteadiness on feet  Hemiplegia and  hemiparesis following cerebral infarction affecting left non-dominant side (HCC)  Other lack of coordination  Muscle weakness (generalized)     Problem List Patient Active Problem List   Diagnosis Date Noted   Controlled type 2 diabetes mellitus with hyperglycemia, without long-term current use of insulin ([place]) 04/06/2020   Abnormality of gait 02/03/2020   Benign essential HTN    CKD (chronic kidney disease), stage II    Hemiparesis affecting left side as late effect of stroke ([place])    Labile blood glucose    Labile blood pressure    Elevated BUN    Hyponatremia    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)    Controlled type 2 diabetes mellitus with hyperglycemia ([place])    Morbid obesity ([place])    Leukocytosis    History of TIA (transient ischemic attack)    Acute CVA (cerebrovascular accident) ([place]) 01/06/2020   Acute ischemic stroke ([place]) 01/05/2020   Hypertensive emergency 01/26/2017   TIA (transient ischemic attack) 01/26/2017   Hypothyroidism 01/26/2017   Aphasia    Essential hypertension    Diabetes mellitus without complication ([place])     Arliss Journey, PT, DPT  05/11/2020, 4:30 PM  [facility] [address] [place], [place], [address] Phone: [phone]   Fax:  [phone]  Name: Jill Rose MRN: [id number] Date of Birth: 1941-02-02

## 2020-05-13 ENCOUNTER — Ambulatory Visit: Payer: Medicare HMO | Admitting: Speech Pathology

## 2020-05-13 ENCOUNTER — Ambulatory Visit: Payer: Medicare HMO | Admitting: Occupational Therapy

## 2020-05-13 ENCOUNTER — Ambulatory Visit: Payer: Medicare HMO | Admitting: Physical Therapy

## 2020-05-13 ENCOUNTER — Other Ambulatory Visit: Payer: Self-pay

## 2020-05-13 ENCOUNTER — Encounter: Payer: Self-pay | Admitting: Physical Therapy

## 2020-05-13 VITALS — BP 151/83 | HR 97

## 2020-05-13 DIAGNOSIS — R2681 Unsteadiness on feet: Secondary | ICD-10-CM

## 2020-05-13 DIAGNOSIS — R2689 Other abnormalities of gait and mobility: Secondary | ICD-10-CM

## 2020-05-13 DIAGNOSIS — I69354 Hemiplegia and hemiparesis following cerebral infarction affecting left non-dominant side: Secondary | ICD-10-CM

## 2020-05-13 DIAGNOSIS — M6281 Muscle weakness (generalized): Secondary | ICD-10-CM

## 2020-05-13 DIAGNOSIS — R4701 Aphasia: Secondary | ICD-10-CM

## 2020-05-13 DIAGNOSIS — R41841 Cognitive communication deficit: Secondary | ICD-10-CM

## 2020-05-14 NOTE — Therapy (Signed)
[facility] [address] [place], [place], [address] Phone: [phone]   Fax:  [phone]  Physical Therapy Treatment  Patient Details  Name: Jill Rose MRN: [id number] Date of Birth: Sep 29, 1940 Referring Provider (PT): Fortino Sic, [place] (being followed by Dr. Posey Pronto)   Encounter Date: 05/13/2020   PT End of Session - 05/13/20 1538    Visit Number 14    Number of Visits 18    Date for PT Re-Evaluation 06/25/20   written for 4 week POC   Authorization Type [employer] Medicare- $35 co pay per day regardless of discipline    PT Start Time 1533    PT Stop Time 1615    PT Time Calculation (min) 42 min    Equipment Utilized During Treatment Gait belt    Activity Tolerance Patient tolerated treatment well;Patient limited by fatigue    Behavior During Therapy [facility] for tasks assessed/performed           Past Medical History:  Diagnosis Date  . Diabetes mellitus without complication ([place])   . Essential hypertension   . High cholesterol   . Hypothyroidism   . Stroke ([place])   . TIA (transient ischemic attack) 2018    History reviewed. No pertinent surgical history.  Vitals:   05/13/20 1539  BP: (!) 151/83  Pulse: 97     Subjective Assessment - 05/13/20 1536    Subjective Reports being tired today. Also complains of left knee pain today. Doing ex's at home without any issues. No falls to report.    Patient is accompained by: Family member   CNA   Pertinent History diabetes mellitus, HTN, hypothyroidism, TIA (2018)    Patient Stated Goals wants to be walking more.    Currently in Pain? Yes    Pain Location Knee    Pain Orientation Left    Pain Descriptors / Indicators Aching;Sore    Pain Type Acute pain    Pain Onset More than a month ago    Pain Frequency Constant    Aggravating Factors  weight bearing with standing and walking    Pain Relieving Factors "no idea"                  [facility] Adult  PT Treatment/Exercise - 05/13/20 1541      Transfers   Transfers Sit to Stand;Stand to Sit    Sit to Stand 4: Min guard;With upper extremity assist;From chair/3-in-1    Stand to Sit 4: Min guard;With upper extremity assist;To chair/3-in-1    Stand Pivot Transfers 4: Min assist    Stand Pivot Transfer Details (indicate cue type and reason) with RW. max cues needed for sequencing, step placement and walker management with increased time needed.       Ambulation/Gait   Ambulation/Gait Yes    Ambulation/Gait Assistance 4: Min guard;4: Min assist    Ambulation Distance (Feet) 15 Feet   x1   Assistive device Rolling walker    Gait Pattern Step-to pattern;Decreased step length - right;Decreased step length - left;Decreased dorsiflexion - left;Decreased dorsiflexion - right;Right foot flat;Left foot flat;Poor foot clearance - left;Poor foot clearance - right;Shuffle;Step-through pattern;Decreased stance time - right;Decreased stride length    Ambulation Surface Level;Indoor      Exercises   Exercises Other Exercises    Other Exercises  with 2# ankle weights on bil LE's: heel<>toe raises, long arc quads and marching for 2 sets of 10 reps. cues for slow, controlled movements  Knee/Hip Exercises: Aerobic   Nustep level 3.0 with UE/LE's for 6 minutes working on full range and increaed speed with goal >/=40 steps per minute                    PT Short Term Goals - 03/19/20 1534      PT SHORT TERM GOAL #1   Title Pt will be independent with initial HEP in order to build upon functional gains made in therapy. ALL STGS DUE 03/14/20    Baseline 03/19/20: met with current program with CNA assistance.    Time --    Period --    Status Achieved    Target Date --      PT SHORT TERM GOAL #2   Title Pt will undergo further assessment of TUG with STG and LTG to be written as appropriate.    Baseline 03/19/20: 163.53 sec's (2 minutes, 43.53 sec's) scored today as baseline, LTG to be set by  primary PT    Time --    Period --    Status Achieved      PT SHORT TERM GOAL #3   Title Pt will decr 5x sit <> stand time with single UE support to 45 seconds or less in order to demo improved BLE strength.    Baseline 03/09/20: 53.79 seconds as baseline, not reassesed on 03/19/20 as it was just done session prior to this one    Status Not Met      PT SHORT TERM GOAL #4   Title Pt will undergo further assessment of BERG with LTG to be written as appropriate.    Baseline 03/19/20: 23/56 scored today as baseline, primary PT to set LTGs    Time --    Period --    Status Achieved      PT SHORT TERM GOAL #5   Title Pt will ambulate at least 6' with RW with supervision in order to improve household mobility.    Baseline 03/19/20: 62 feet with RW with min guard assist. improved from 20 feet, just not to goal    Time --    Period --    Status Not Met      PT SHORT TERM GOAL #6   Title Pt will perform stand pivot transfers and sit <> stands with RW with supervision in order to improve functional mobility.    Baseline 03/19/20: met in session today from RW to mat table    Time --    Period --    Status Achieved             PT Long Term Goals - 04/26/20 2115      PT LONG TERM GOAL #1   Title Pt will be independent with final HEP in order to build upon functional gains made in therapy. ALL LTGS DUE 05/24/20    Baseline 04/22/20: pt reports she has stopped doing her ex's at home the past few weeks and has not been walking at home with family    Time 4    Period Weeks    Status On-going    Target Date 05/24/20      PT LONG TERM GOAL #2   Title Pt will decrease TUG time to 3 minutes and 30 seconds or less with RW in order to demo decr fall risk.    Baseline 04/22/20: 5 minutes 59.25 sec's, increased from last assessment    Time 4    Period Weeks  Status Revised      PT LONG TERM GOAL #3   Title Pt will improve BERG score to at least a 28/56 in order to demo decr fall risk.     Baseline 04/22/20: 19/56, decreased from 23/56    Time 4    Period Weeks    Status Revised      PT LONG TERM GOAL #4   Title Pt will decr 5x sit <> stand time to 40 seconds or less from mat table with UE support in order to demo improved functional BLE strength.    Baseline 04/22/20: 51.56 sec's with UE support from standard height chair, improved from 53.79 sec's just not to goal    Time 4    Period Weeks    Status Revised      PT LONG TERM GOAL #6   Title Pt will perform all bed mobility with supervision in order to decr caregiver burden.    Baseline 04/22/20: unable to assess due to time constraints    Time 4    Period Weeks    Status On-going                 Plan - 05/13/20 1538    Clinical Impression Statement Today's skilled session continued to focus on strengthening, transfer training and gait with RW. The pt continues to need increased time and cues with all tasks. The pt also needs increased time for processing. The pt should benefit from contineud PT to progress toward unmet goals.    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    Examination-Activity Limitations Bed Mobility;Bend;Locomotion Level;Stairs;Stand;Transfers;Squat    Examination-Participation Restrictions Community Activity    Stability/Clinical Decision Making Stable/Uncomplicated    Rehab Potential Good    PT Frequency 2x / week    PT Duration 4 weeks    PT Treatment/Interventions ADLs/Self Care Home Management;Gait training;DME Instruction;Stair training;Functional mobility training;Therapeutic activities;Neuromuscular re-education;Balance training;Therapeutic exercise;Patient/family education;Orthotic Fit/Training;Energy conservation;Passive range of motion    PT Next Visit Plan monitor BP. any kind of LE strengthening. continue with nustep for  stengthening/activity tolerance/working on increased speed with mobility, needs transfer/turning training with RW. stepping strategies to promote increased step length,    Consulted and Agree with Plan of Care Patient;Family member/caregiver    Family Member Consulted CNA [name]           Patient will benefit from skilled therapeutic intervention in order to improve the following deficits and impairments:  Abnormal gait, Decreased balance, Decreased activity tolerance, Decreased coordination, Decreased endurance, Decreased knowledge of use of DME, Decreased range of motion, Difficulty walking, Decreased strength, Postural dysfunction  Visit Diagnosis: Hemiplegia and hemiparesis following cerebral infarction affecting left non-dominant side (HCC)  Unsteadiness on feet  Muscle weakness (generalized)  Other abnormalities of gait and mobility     Problem List Patient Active Problem List   Diagnosis Date Noted  . Controlled type 2 diabetes mellitus with hyperglycemia, without long-term current use of insulin ([place]) 04/06/2020  . Abnormality of gait 02/03/2020  . Benign essential HTN   . CKD (chronic kidney disease), stage II   . Hemiparesis affecting left side as late effect of stroke ([place])   . Labile blood glucose   . Labile blood pressure   . Elevated BUN   . Hyponatremia   . Diabetic peripheral neuropathy ([place])   . Right middle cerebral artery stroke ([place])  01/14/2020  . Dyslipidemia   . Class 1 obesity due to excess calories with serious comorbidity and body mass index (BMI) of 30.0 to 30.9 in adult   . Left hemiparesis ([place])   . Controlled type 2 diabetes mellitus with hyperglycemia ([place])   . Morbid obesity ([place])   . Leukocytosis   . History of TIA (transient ischemic attack)   . Acute CVA (cerebrovascular accident) ([place]) 01/06/2020  . Acute ischemic stroke ([place]) 01/05/2020  . Hypertensive emergency 01/26/2017  . TIA (transient ischemic attack) 01/26/2017  . Hypothyroidism  01/26/2017  . Aphasia   . Essential hypertension   . Diabetes mellitus without complication ([place])     Willow Ora, PTA, [facility] [address] [place], [place] [address] [phone] 05/14/20, 4:15 PM   Name: Jill Rose MRN: [id number] Date of Birth: October 15, 1940

## 2020-05-14 NOTE — Therapy (Signed)
[facility] [address] [place], [place], [address] Phone: [phone]   Fax:  [phone]  Speech Language Pathology Treatment  Patient Details  Name: Jill Rose MRN: [id number] Date of Birth: [birth year] Referring Provider (SLP): Jamse Arn, MD   Encounter Date: 05/13/2020   End of Session - 05/13/20 1403    Visit Number 11    Number of Visits 17    Date for SLP Re-Evaluation 05/15/20    SLP Start Time 64    SLP Stop Time  1445    SLP Time Calculation (min) 43 min    Activity Tolerance Patient tolerated treatment well;Patient limited by fatigue           Past Medical History:  Diagnosis Date  . Diabetes mellitus without complication ([place])   . Essential hypertension   . High cholesterol   . Hypothyroidism   . Stroke ([place])   . TIA (transient ischemic attack) 2018    No past surgical history on file.  There were no vitals filed for this visit.   Subjective Assessment - 05/13/20 1403    Subjective No binder today    Currently in Pain? No/denies                 ADULT SLP TREATMENT - 05/13/20 1403      General Information   Behavior/Cognition Alert;Cooperative;Requires cueing;Distractible;Decreased sustained attention      Treatment Provided   Treatment provided Cognitive-Linquistic      Cognitive-Linquistic Treatment   Treatment focused on Cognition;Patient/family/caregiver education    Skilled Treatment Aide handed SLP one sheet of notebook paper, pt's journal entry. Date was written, along with what pt had for breakfast, no other details. SLP provided mod cues for recall of other events during her day; pt recorded these with extra time, usual mod-max A to state phrases aloud before writing and awareness of errors. Instructed pt and aide on including more specific details, such as completing exercises, pt putting on her own sweater that morning, washing her hair, etc. Provided some  worksheets with simple cognitive-linguistic tasks for pt/aide to complete at home to target sustained attention, error awareness. Demo'd how to assist pt in this task; allowing pt to complete as independently as possible, redirecting with min cues for loss of attention. Pt completed simple opposites with rare min cues and extra time. By the time she reached item 12, unable to remain focused despite max cues, task switching (40 minutes into session). Instructed aide when this occurs at home to have pt take a break, and to work on worksheets just a little each day rather than complete all at once. Aide suggested they could work on some in the lobby during break before PT session; SLP advised against this as pt likely needed a break due to current attention levels at end of ST session.       Assessment / Recommendations / Plan   Plan Continue with current plan of care      Progression Toward Goals   Progression toward goals --   slow progress             SLP Short Term Goals - 05/13/20 1403      SLP SHORT TERM GOAL #1   Title pt will *attempt to access* her memory system for simple organization of discipline-specific items, or for temporal orientation x5 sessions    Time 1    Period Weeks    Status Not Met  SLP SHORT TERM GOAL #2   Title pt will demo knowledge of items in her memory system/notebook by correctly accessing them in 2 sessions    Time 1    Period Weeks    Status Not Met      SLP SHORT TERM GOAL #3   Title pt will demonstrate error awareness in therapy tasks 75% with min cues for re-check in 3 sessions    Time 1    Period Weeks    Status Not Met      SLP SHORT TERM GOAL #4   Title pt will name 8 items for simple categories x 3 sessions    Time 1    Period Weeks    Status Not Met            SLP Long Term Goals - 110/28/21 1403     SLP LONG TERM GOAL #1   Title pt will demo correct usage of a memory compensation system for appointment and/or logging blood  pressure and blood sugar, daily schedules in 3 sessions    Time 2    Period Weeks    Status On-going      SLP LONG TERM GOAL #2   Title Pt will maintain selective attention in functional task for 8 minutes in mod noisy environment with rare min cues x 2 sessions.    Time 2    Period Weeks    Status On-going      SLP LONG TERM GOAL #3   Title pt will demo anticipatory awareness regarding deficit areas, independently, in 3 sessions    Time 2    Period Weeks    Status On-going      SLP LONG TERM GOAL #4   Title pt will use compensations for anomia in 8 minutes simple conversation x3 visits    Time 2    Period Weeks    Status On-going            Plan - 05/13/20 1403   Clinical Impression Statement Jill Rose presents with overall moderate cognitive communication deficits. Pt would benefit from audiological eval. Suspect attention, primarily, and slow processing contributes to this difficulty. Dr. 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 Prior to her CVA pt enjoyed cooking and reading about natural medicine/ ayurveda. I recommend skilled ST to address cognitive and language deficits for better communication with family and friends, to increase independence and improve quality of life.    Speech Therapy Frequency 2x / week    Duration --   8 weeks or 17 visits   Treatment/Interventions SLP instruction and feedback;Functional tasks;Cognitive reorganization;Compensatory strategies;Internal/external aids;Multimodal communcation approach;Patient/family education;Environmental controls;Language facilitation;Compensatory techniques    Potential to Achieve Goals Good    Consulted and Agree with Plan of Care Patient           Patient will benefit from skilled therapeutic intervention in order to improve the following deficits and impairments:   Cognitive communication deficit  Aphasia    Problem List Patient Active Problem List   Diagnosis Date  Noted  . Controlled type 2 diabetes mellitus with hyperglycemia, without long-term current use of insulin ([place]) 04/06/2020  . Abnormality of gait 02/03/2020  . Benign essential HTN   . CKD (chronic kidney disease), stage II   . Hemiparesis affecting left side as late effect of stroke ([place])   . Labile blood glucose   . Labile blood pressure   . Elevated BUN   . Hyponatremia   .  Diabetic peripheral neuropathy ([place])   . 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 Dyslipidemia   . Class 1 obesity due to excess calories with serious comorbidity and body mass index (BMI) of 30.0 to 30.9 in adult   . Left hemiparesis ([place])   . Controlled type 2 diabetes mellitus with hyperglycemia ([place])   . Morbid obesity ([place])   . Leukocytosis   . History of TIA (transient ischemic attack)   . Acute CVA (cerebrovascular accident) ([place]) 01/06/2020  . Acute ischemic stroke ([place]) 01/05/2020  . Hypertensive emergency 01/26/2017  . TIA (transient ischemic attack) 01/26/2017  . Hypothyroidism 01/26/2017  . Aphasia   . Essential hypertension   . Diabetes mellitus without complication [facility])    Jill Rose, [place], CCC-SLP Speech-Language Pathologist  Aliene Altes 05/14/2020, 11:30 AM  [facility] [address] [place], [place], [address] Phone: [phone]   Fax:  [phone]   Name: DESHEA POOLEY MRN: [id number] Date of Birth: 03-06-1941

## 2020-05-18 ENCOUNTER — Ambulatory Visit: Payer: Medicare HMO | Admitting: Occupational Therapy

## 2020-05-18 ENCOUNTER — Encounter: Payer: Self-pay | Admitting: Occupational Therapy

## 2020-05-18 ENCOUNTER — Encounter: Payer: Self-pay | Admitting: Physical Therapy

## 2020-05-18 ENCOUNTER — Ambulatory Visit: Payer: Medicare HMO | Attending: Physician Assistant | Admitting: Physical Therapy

## 2020-05-18 ENCOUNTER — Other Ambulatory Visit: Payer: Self-pay

## 2020-05-18 ENCOUNTER — Ambulatory Visit: Payer: Medicare HMO | Admitting: Physical Medicine & Rehabilitation

## 2020-05-18 ENCOUNTER — Ambulatory Visit: Payer: Medicare HMO

## 2020-05-18 VITALS — BP 167/79 | HR 98

## 2020-05-18 DIAGNOSIS — R2689 Other abnormalities of gait and mobility: Secondary | ICD-10-CM | POA: Diagnosis present

## 2020-05-18 DIAGNOSIS — R29818 Other symptoms and signs involving the nervous system: Secondary | ICD-10-CM | POA: Diagnosis present

## 2020-05-18 DIAGNOSIS — R2681 Unsteadiness on feet: Secondary | ICD-10-CM

## 2020-05-18 DIAGNOSIS — M6281 Muscle weakness (generalized): Secondary | ICD-10-CM | POA: Diagnosis present

## 2020-05-18 DIAGNOSIS — R4701 Aphasia: Secondary | ICD-10-CM | POA: Diagnosis present

## 2020-05-18 DIAGNOSIS — R41841 Cognitive communication deficit: Secondary | ICD-10-CM | POA: Insufficient documentation

## 2020-05-18 DIAGNOSIS — R278 Other lack of coordination: Secondary | ICD-10-CM | POA: Diagnosis present

## 2020-05-18 DIAGNOSIS — I69354 Hemiplegia and hemiparesis following cerebral infarction affecting left non-dominant side: Secondary | ICD-10-CM | POA: Insufficient documentation

## 2020-05-18 NOTE — Therapy (Addendum)
[facility] [address] [place], [place], [address] Phone: [phone]   Fax:  [phone]  Occupational Therapy Treatment and Progress Note  Patient Details  Name: Jill Rose MRN: [id number] Date of Birth: 25-Apr-1941 No data recorded   This progress note covers dates of service from 03/14/92-71/6/96 Have recertified patient for additional 8 weeks of OT to address remaining goals  Encounter Date: 05/18/2020   OT End of Session - 05/18/20 1719    Visit Number 10    Number of Visits 17    Date for OT Re-Evaluation 07/15/20    Authorization Type [employer] MCR    Authorization - Visit Number 10    Authorization - Number of Visits 10    Progress Note Due on Visit 20    OT Start Time 1615    OT Stop Time 1700    OT Time Calculation (min) 45 min    Activity Tolerance Patient limited by lethargy    Behavior During Therapy [facility] for tasks assessed/performed           Past Medical History:  Diagnosis Date  . Diabetes mellitus without complication ([place])   . Essential hypertension   . High cholesterol   . Hypothyroidism   . Stroke ([place])   . TIA (transient ischemic attack) 2018    History reviewed. No pertinent surgical history.  There were no vitals filed for this visit.   Subjective Assessment - 05/18/20 1625    Subjective  Patient indicates no more falls    Pertinent History Rt MCA CVA 01/05/20. PMH: HTN, HLD, T2DM, Diabetic neuropathy    Limitations no driving, fall risk, HOH    Currently in Pain? No/denies    Pain Score 0-No pain                        OT Treatments/Exercises (OP) - 05/18/20 0001      ADLs   ADL Comments Reviewed remaining short term goals, and patient has shown progress toward most at this time. Patient with consistent caregiver, and this seems to be helping with carryover to home setting.        Neurological Re-education Exercises   Other Exercises 1 Neuromuscular  reeducation to address functional mobility and balance.  Worked on sit to stand and stand to sit with decreased relaince on UE's and controlled descent more in midline.  Patient requires mod facilitation to accept weight onto left leg - did best with cues also for upright posture.  Patient stating feeling pleased with her ability to stand today.                      OT Short Term Goals - 05/18/20 1626      OT SHORT TERM GOAL #1   Title Independent with coordination HEP Lt hand    Time 4    Period Weeks    Status Partially Met   Issued coordination HEP 03/19/20  Patient cpmpletes with assitance from CNA 05/18/20     OT [place] #2   Title Pt to perform UE dressing with min assist or less and LE dressing with mod assist or less    Time 4    Period Weeks    Status Achieved   Pt reports doing approximately 50% of UB dressing 03/19/20     OT SHORT TERM GOAL #3   Title Pt to perform simple snack prep/cold meal prep standing  level with DME and supervision prn    Time 4    Period Weeks    Status Deferred   Pt reports assisting with cutting vegetables with husband 03/19/20 - seated.     OT SHORT TERM GOAL #4   Title Pt to perform sustained high level reaching LUE for endurance    Time 4    Period Weeks    Status Achieved      OT SHORT TERM GOAL #5   Title Pt to return to putting up/styling hair mod I level    Time 4    Period Weeks    Status Partially Met   Pt reports needing assistance for clasps and barettes            OT Long Term Goals - 05/18/20 1721      OT LONG TERM GOAL #1   Title Independent with strengthening HEP for LUE    Time 8    Period Weeks    Status On-going      OT LONG TERM GOAL #2   Title Improve coordination Lt hand as evidenced by performing 9 hole peg test in 41 sec or less    Baseline 48.59 sec    Time 8    Period Weeks    Status On-going      OT LONG TERM GOAL #3   Title Pt to be mod I level with all BADLS    Time 8    Period Weeks     Status On-going      OT LONG TERM GOAL #4   Title Pt to cook simple meal with DME prn and distant supervision    Time 8    Period Weeks    Status On-going      OT LONG TERM GOAL #5   Title Pt to perform light cleaning tasks (washing dishes, folding laundry, etc) mod I level    Time 8    Period Weeks    Status On-going                 Plan - 05/18/20 1720    Clinical Impression Statement Pt limited by decreased cognition - attention and memory, and anxiety.  Does well with repetition and praise    OT Occupational Profile and History Detailed Assessment- Review of Records and additional review of physical, cognitive, psychosocial history related to current functional performance    Occupational performance deficits (Please refer to evaluation for details): ADL's;IADL's    Body Structure / Function / Physical Skills ADL;ROM;Balance;Body mechanics;Mobility;Flexibility;Strength;Coordination;FMC;UE functional use;Decreased knowledge of use of DME    Rehab Potential Good    Clinical Decision Making Several treatment options, min-mod task modification necessary    Comorbidities Affecting Occupational Performance: May have comorbidities impacting occupational performance    Modification or Assistance to Complete Evaluation  Min-Moderate modification of tasks or assist with assess necessary to complete eval    OT Frequency 2x / week    OT Duration 8 weeks   Plus eval   OT Treatment/Interventions Self-care/ADL training;Therapeutic exercise;Functional Mobility Training;Neuromuscular education;Manual Therapy;Therapeutic activities;Coping strategies training;DME and/or AE instruction;Cognitive remediation/compensation;Moist Heat;Passive range of motion;Patient/family education    Plan work on wt shifts in standing, functional transfers, and ADL    Consulted and Agree with Plan of Care Patient;Family member/caregiver    Family Member Consulted caregiver CNA           Patient will  benefit from skilled therapeutic intervention in order to improve the following deficits and  impairments:   Body Structure / Function / Physical Skills: ADL, ROM, Balance, Body mechanics, Mobility, Flexibility, Strength, Coordination, FMC, UE functional use, Decreased knowledge of use of DME       Visit Diagnosis: Hemiplegia and hemiparesis following cerebral infarction affecting left non-dominant side ([place]) - Plan: Ot plan of care cert/re-cert  Unsteadiness on feet - Plan: Ot plan of care cert/re-cert  Muscle weakness (generalized) - Plan: Ot plan of care cert/re-cert  Other lack of coordination - Plan: Ot plan of care cert/re-cert  Other symptoms and signs involving the nervous system - Plan: Ot plan of care cert/re-cert    Problem List Patient Active Problem List   Diagnosis Date Noted  . Controlled type 2 diabetes mellitus with hyperglycemia, without long-term current use of insulin ([place]) 04/06/2020  . Abnormality of gait 02/03/2020  . Benign essential HTN   . CKD (chronic kidney disease), stage II   . Hemiparesis affecting left side as late effect of stroke ([place])   . Labile blood glucose   . Labile blood pressure   . Elevated BUN   . Hyponatremia   . Diabetic peripheral neuropathy ([place])   . 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 Dyslipidemia   . Class 1 obesity due to excess calories with serious comorbidity and body mass index (BMI) of 30.0 to 30.9 in adult   . Left hemiparesis ([place])   . Controlled type 2 diabetes mellitus with hyperglycemia ([place])   . Morbid obesity ([place])   . Leukocytosis   . History of TIA (transient ischemic attack)   . Acute CVA (cerebrovascular accident) ([place]) 01/06/2020  . Acute ischemic stroke ([place]) 01/05/2020  . Hypertensive emergency 01/26/2017  . TIA (transient ischemic attack) 01/26/2017  . Hypothyroidism 01/26/2017  . Aphasia   . Essential hypertension   . Diabetes mellitus without complication [facility])     Mariah Milling 05/18/2020, 5:24 PM  [facility] [address] [place], [place], [address] Phone: [phone]   Fax:  [phone]  Name: KATRIONA SCHMIERER MRN: [id number] Date of Birth: 10/24/1940

## 2020-05-19 NOTE — Therapy (Signed)
[facility] [address] [place], [place], [address] Phone: [phone]   Fax:  [phone]  Physical Therapy Treatment  Patient Details  Name: Jill Rose MRN: [id number] Date of Birth: [birth year] Referring Provider (PT): Fortino Sic, [place] (being followed by Dr. Posey Pronto)   Encounter Date: 05/18/2020   PT End of Session - 05/19/20 0914    Visit Number 15    Number of Visits 18    Date for PT Re-Evaluation 06/25/20   written for 4 week POC   Authorization Type [employer]- $35 co pay per day regardless of discipline    PT Start Time 1533    PT Stop Time 1613    PT Time Calculation (min) 40 min    Equipment Utilized During Treatment Gait belt    Activity Tolerance Patient tolerated treatment well;Patient limited by fatigue    Behavior During Therapy [facility] for tasks assessed/performed           Past Medical History:  Diagnosis Date  . Diabetes mellitus without complication ([place])   . Essential hypertension   . High cholesterol   . Hypothyroidism   . Stroke ([place])   . TIA (transient ischemic attack) 2018    History reviewed. No pertinent surgical history.  Vitals:   05/18/20 1547 05/18/20 1558  BP: (!) 172/70 (!) 167/79  Pulse:  98     Subjective Assessment - 05/18/20 1537    Subjective No changes since she was last here. No falls.    Patient is accompained by: Family member   CNA   Pertinent History diabetes mellitus, HTN, hypothyroidism, TIA (2018)    Patient Stated Goals wants to be walking more.    Currently in Pain? No/denies    Pain Onset More than a month ago                             [facility] Adult PT Treatment/Exercise - 05/19/20 0001      Transfers   Transfers Sit to Stand;Stand to Sit    Sit to Stand 4: Min guard;With upper extremity assist;From chair/3-in-1    Sit to Stand Details Tactile cues for weight shifting;Verbal cues for sequencing;Verbal cues for  technique;Verbal cues for safe use of DME/AE;Manual facilitation for weight shifting;Manual facilitation for placement    Sit to Stand Details (indicate cue type and reason) pt still needing reminder cues for proper UE placement for sit <> stand transfers throughout session, pt initially beginning to perform with single UE on w/c and one on RW, but then reverts to trying to stand with BUE support on RW, pt needing verbal and demo cues to scoot towards edge of mat table/w/c for incr ease of transfer    Stand to Sit 4: Min guard;With upper extremity assist;To chair/3-in-1    Stand to Sit Details needs intermittent verbal cues to reach back with one hand    Stand Pivot Transfers 4: Min assist    Stand Pivot Transfer Details (indicate cue type and reason) with RW, max cues for sequencing and incr foot clearance, and assist for turning RW, incr time needed for transfer from w/c > mat table    Comments once in standing from sit <> stand, pt with tendency to have weight posteriorly on heels, needing min guard and assist for weight shifting      Ambulation/Gait   Ambulation/Gait Yes    Ambulation/Gait Assistance 4: Min guard;4: Min assist  Ambulation/Gait Assistance Details facilitation for weight shifting for incr step length and helping with RW propulsion, pt needing max encouragement to take longer steps, with pt intermittently being able to perform a partial step through pattern vs. step to     Ambulation Distance (Feet) 28 Feet   x1, 33 x 1   Assistive device Rolling walker    Gait Pattern Step-to pattern;Decreased step length - right;Decreased step length - left;Decreased dorsiflexion - left;Decreased dorsiflexion - right;Right foot flat;Left foot flat;Poor foot clearance - left;Poor foot clearance - right;Shuffle;Step-through pattern;Decreased stance time - right;Decreased stride length    Ambulation Surface Level;Indoor      Exercises   Exercises Other Exercises    Other Exercises  seated  marching 2 x 10 reps with use of boomwhacker as visual cue on how high to lift thigh, then performing step out and step in (with pt's B feet on aerobic step) with max verbal/demo/tactile cues to lift up legs each time with pt with still with tendency to slide foot 2 x5 reps B, then performing standing with RW alternating foot taps to 2" beam, cues for incr foot clearance and min guard/min A for balance - pt with tendency to get L foot caught at times                    PT Short Term Goals - 03/19/20 1534      PT SHORT TERM GOAL #1   Title Pt will be independent with initial HEP in order to build upon functional gains made in therapy. ALL STGS DUE 03/14/20    Baseline 03/19/20: met with current program with CNA assistance.    Time --    Period --    Status Achieved    Target Date --      PT SHORT TERM GOAL #2   Title Pt will undergo further assessment of TUG with STG and LTG to be written as appropriate.    Baseline 03/19/20: 163.53 sec's (2 minutes, 43.53 sec's) scored today as baseline, LTG to be set by primary PT    Time --    Period --    Status Achieved      PT SHORT TERM GOAL #3   Title Pt will decr 5x sit <> stand time with single UE support to 45 seconds or less in order to demo improved BLE strength.    Baseline 03/09/20: 53.79 seconds as baseline, not reassesed on 03/19/20 as it was just done session prior to this one    Status Not Met      PT SHORT TERM GOAL #4   Title Pt will undergo further assessment of BERG with LTG to be written as appropriate.    Baseline 03/19/20: 23/56 scored today as baseline, primary PT to set LTGs    Time --    Period --    Status Achieved      PT SHORT TERM GOAL #5   Title Pt will ambulate at least 81' with RW with supervision in order to improve household mobility.    Baseline 03/19/20: 62 feet with RW with min guard assist. improved from 20 feet, just not to goal    Time --    Period --    Status Not Met      PT SHORT TERM GOAL #6    Title Pt will perform stand pivot transfers and sit <> stands with RW with supervision in order to improve functional mobility.    Baseline 03/19/20:  met in session today from RW to mat table    Time --    Period --    Status Achieved             PT Long Term Goals - 05/19/20 0915      PT LONG TERM GOAL #1   Title Pt will be independent with final HEP in order to build upon functional gains made in therapy. ALL LTGS DUE 05/28/20    Baseline 04/22/20: pt reports she has stopped doing her ex's at home the past few weeks and has not been walking at home with family    Time 4    Period Weeks    Status On-going    Target Date 05/28/20      PT LONG TERM GOAL #2   Title Pt will decrease TUG time to 3 minutes and 30 seconds or less with RW in order to demo decr fall risk.    Baseline 04/22/20: 5 minutes 59.25 sec's, increased from last assessment    Time 4    Period Weeks    Status Revised      PT LONG TERM GOAL #3   Title Pt will improve BERG score to at least a 28/56 in order to demo decr fall risk.    Baseline 04/22/20: 19/56, decreased from 23/56    Time 4    Period Weeks    Status Revised      PT LONG TERM GOAL #4   Title Pt will decr 5x sit <> stand time to 40 seconds or less from mat table with UE support in order to demo improved functional BLE strength.    Baseline 04/22/20: 51.56 sec's with UE support from standard height chair, improved from 53.79 sec's just not to goal    Time 4    Period Weeks    Status Revised      PT LONG TERM GOAL #6   Title Pt will perform all bed mobility with supervision in order to decr caregiver burden.    Baseline 04/22/20: unable to assess due to time constraints    Time 4    Period Weeks    Status On-going                 Plan - 05/19/20 3016    Clinical Impression Statement Pt able to incr walking distance today with RW - able to perform 2 bouts of 28' and 33'. Therapist providing assist for weight shifting and for RW propulsion.  Verbal cues provided throughout for incr step length B. Continues to need incr time for sit <> stand and stand pviot transfers with RW. Will continue to progress towards LTGs - revised current LTG date as next week is week 4 in current POC.    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    Examination-Activity Limitations Bed Mobility;Bend;Locomotion Level;Stairs;Stand;Transfers;Squat    Examination-Participation Restrictions Community Activity    Stability/Clinical Decision Making Stable/Uncomplicated    Rehab Potential Good    PT Frequency 2x / week    PT Duration 4 weeks    PT Treatment/Interventions ADLs/Self Care Home Management;Gait training;DME Instruction;Stair training;Functional mobility training;Therapeutic activities;Neuromuscular re-education;Balance training;Therapeutic exercise;Patient/family education;Orthotic Fit/Training;Energy conservation;Passive range of motion    PT Next Visit Plan LTGs due at end of next week. monitor BP. any kind of LE strengthening. standing balance with decr UE support, continue with  nustep for stengthening/activity tolerance/working on increased speed with mobility, needs transfer/turning training with RW. stepping strategies to promote increased step length,    Consulted and Agree with Plan of Care Patient;Family member/caregiver    Family Member Consulted CNA [name]           Patient will benefit from skilled therapeutic intervention in order to improve the following deficits and impairments:  Abnormal gait, Decreased balance, Decreased activity tolerance, Decreased coordination, Decreased endurance, Decreased knowledge of use of DME, Decreased range of motion, Difficulty walking, Decreased strength, Postural dysfunction  Visit Diagnosis: Hemiplegia and hemiparesis following cerebral infarction  affecting left non-dominant side (HCC)  Unsteadiness on feet  Muscle weakness (generalized)  Other abnormalities of gait and mobility     Problem List Patient Active Problem List   Diagnosis Date Noted  . Controlled type 2 diabetes mellitus with hyperglycemia, without long-term current use of insulin ([place]) 04/06/2020  . Abnormality of gait 02/03/2020  . Benign essential HTN   . CKD (chronic kidney disease), stage II   . Hemiparesis affecting left side as late effect of stroke ([place])   . Labile blood glucose   . Labile blood pressure   . Elevated BUN   . Hyponatremia   . Diabetic peripheral neuropathy ([place])   . 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 Dyslipidemia   . Class 1 obesity due to excess calories with serious comorbidity and body mass index (BMI) of 30.0 to 30.9 in adult   . Left hemiparesis ([place])   . Controlled type 2 diabetes mellitus with hyperglycemia ([place])   . Morbid obesity ([place])   . Leukocytosis   . History of TIA (transient ischemic attack)   . Acute CVA (cerebrovascular accident) ([place]) 01/06/2020  . Acute ischemic stroke ([place]) 01/05/2020  . Hypertensive emergency 01/26/2017  . TIA (transient ischemic attack) 01/26/2017  . Hypothyroidism 01/26/2017  . Aphasia   . Essential hypertension   . Diabetes mellitus without complication ([place])     Lillia Pauls, DPT  05/19/2020, 9:25 AM  [facility] [address] [place], [place], [address] Phone: [phone]   Fax:  [phone]  Name: Jill Rose MRN: [id number] Date of Birth: 07-Feb-1941

## 2020-05-19 NOTE — Therapy (Addendum)
[facility] [address] [place], [place], [address] Phone: [phone]   Fax:  [phone]  Speech Language Pathology Treatment  Patient Details  Name: Jill Rose MRN: [id number] Date of Birth: 1940-10-28 Referring Provider (SLP): Jamse Arn, MD   Encounter Date: 05/18/2020   End of Session - 05/19/20 1120    Visit Number 12    Number of Visits 20    Date for SLP Re-Evaluation 07/13/20    SLP Start Time 1450    SLP Stop Time  1531    SLP Time Calculation (min) 41 min    Activity Tolerance Patient tolerated treatment well;Patient limited by fatigue           Past Medical History:  Diagnosis Date  . Diabetes mellitus without complication ([place])   . Essential hypertension   . High cholesterol   . Hypothyroidism   . Stroke ([place])   . TIA (transient ischemic attack) 2018    No past surgical history on file.  There were no vitals filed for this visit.   Subjective Assessment - 05/19/20 1032    Subjective Pt arrives iwth aid - [name].    Currently in Pain? No/denies                 ADULT SLP TREATMENT - 05/19/20 1033      General Information   Behavior/Cognition Alert;Cooperative;Requires cueing;Distractible;Decreased sustained attention      Treatment Provided   Treatment provided Cognitive-Linquistic      Cognitive-Linquistic Treatment   Treatment focused on Cognition;Patient/family/caregiver education    Skilled Treatment Pt arrived without anything written for today in her journal, and nothing written since last day with the same aide who attended previous session. SLP assisted pt in recalling specifics about her washcloth bath and her dressing, while also training aide in how to cue, allowing pt processing time instead of providing the answer for her or providing a cue too quickly. Pt thought of 8 details when provided  with extra time and usual re-directing back to task. SLP expects  internal distraction was basis for decr'd focused/selective attention as ST room was very quiet. Kaedyn req'd min-mod  A to recall last 3 items. Pt noted to fatigue after approx 25-30 minutes, requiring mod-max A for attention and for processing.       Assessment / Recommendations / Plan   Plan Continue with current plan of care      Progression Toward Goals   Progression toward goals Not progressing toward goals (comment)   ? particiaption by family and aids for carryover at home           SLP Education - 05/19/20 1119    Education Details appropriate cueing for pt, give pt extra time to respond due to pt incr'd processing time    Person(s) Educated Patient;Caregiver(s)    Methods Explanation    Comprehension Verbalized understanding            SLP Short Term Goals - 05/14/20 1129      SLP SHORT TERM GOAL #1   Title pt will *attempt to access* her memory system for simple organization of discipline-specific items, or for temporal orientation x5 sessions    Time 1    Period Weeks    Status Not Met      SLP SHORT TERM GOAL #2   Title pt will demo knowledge of items in her memory system/notebook by correctly accessing them in 2 sessions  Time 1    Period Weeks    Status Not Met      SLP SHORT TERM GOAL #3   Title pt will demonstrate error awareness in therapy tasks 75% with min cues for re-check in 3 sessions    Time 1    Period Weeks    Status Not Met      SLP SHORT TERM GOAL #4   Title pt will name 8 items for simple categories x 3 sessions    Time 1    Period Weeks    Status Not Met            SLP Long Term Goals - 05/19/20 1124      SLP LONG TERM GOAL #1   Title pt will demo correct usage of a memory compensation system for appointment and/or logging blood pressure and blood sugar, daily schedules in 3 sessions    Time 5   Period Weeks    Status On-going      SLP LONG TERM GOAL #2   Title Pt will maintain selective attention in functional task for 8  minutes in mod noisy environment with rare min cues x 2 sessions.    Time 5    Period Weeks    Status On-going      SLP LONG TERM GOAL #3   Title pt will demo anticipatory awareness regarding deficit areas, independently, in 3 sessions    Time 5    Period Weeks    Status On-going      SLP LONG TERM GOAL #4   Title pt will use compensations for anomia in 8 minutes simple conversation x3 visits    Time 5    Period Weeks    Status On-going            Plan - 05/19/20 1121    Clinical Impression Statement [name] presents with overall moderate cognitive communication deficits. Suspect attention, primarily, and slow processing contributes to this difficulty. SLP questions consistency of carryover in home environment with therapy tasks. [name], one of pt's caregivers who is consistently with her at therapy, is working with pt and SLP cont to train her how to properly cue pt. ST will not renew patient 's current therapy plan due to pt not demonstrating enough improvement with attention to warrant renewal. Prior to her CVA pt enjoyed cooking and reading about natural medicine/ ayurveda. I recommend skilled ST to address cognitive and language deficits for better communication with family and friends, to increase independence and improve quality of life.    Speech Therapy Frequency 2x / week    Duration --   4 weeks or 20 visits   Treatment/Interventions SLP instruction and feedback;Functional tasks;Cognitive reorganization;Compensatory strategies;Internal/external aids;Multimodal communcation approach;Patient/family education;Environmental controls;Language facilitation;Compensatory techniques    Potential to Achieve Goals Good    Consulted and Agree with Plan of Care Patient           Patient will benefit from skilled therapeutic intervention in order to improve the following deficits and impairments:   Cognitive communication deficit  Aphasia    Problem List Patient Active  Problem List   Diagnosis Date Noted  . Controlled type 2 diabetes mellitus with hyperglycemia, without long-term current use of insulin ([place]) 04/06/2020  . Abnormality of gait 02/03/2020  . Benign essential HTN   . CKD (chronic kidney disease), stage II   . Hemiparesis affecting left side as late effect of stroke ([place])   . Labile blood glucose   .  Labile blood pressure   . Elevated BUN   . Hyponatremia   . Diabetic peripheral neuropathy ([place])   . 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 Dyslipidemia   . Class 1 obesity due to excess calories with serious comorbidity and body mass index (BMI) of 30.0 to 30.9 in adult   . Left hemiparesis ([place])   . Controlled type 2 diabetes mellitus with hyperglycemia ([place])   . Morbid obesity ([place])   . Leukocytosis   . History of TIA (transient ischemic attack)   . Acute CVA (cerebrovascular accident) ([place]) 01/06/2020  . Acute ischemic stroke ([place]) 01/05/2020  . Hypertensive emergency 01/26/2017  . TIA (transient ischemic attack) 01/26/2017  . Hypothyroidism 01/26/2017  . Aphasia   . Essential hypertension   . Diabetes mellitus without complication ([employer])     [place] ,[place], [place]  05/19/2020, 11:33 AM  [facility] [address] [place], [place], [address] Phone: [phone]   Fax:  [phone]   Name: Jill Rose MRN: [id number] Date of Birth: 1941-01-15

## 2020-05-20 ENCOUNTER — Ambulatory Visit: Payer: Medicare HMO | Admitting: Physical Therapy

## 2020-05-20 ENCOUNTER — Ambulatory Visit: Payer: Medicare HMO | Admitting: Occupational Therapy

## 2020-05-25 ENCOUNTER — Ambulatory Visit: Payer: Medicare HMO | Admitting: Occupational Therapy

## 2020-05-25 ENCOUNTER — Ambulatory Visit: Payer: Medicare HMO | Admitting: Physical Therapy

## 2020-05-25 ENCOUNTER — Ambulatory Visit: Payer: Medicare HMO

## 2020-05-25 ENCOUNTER — Encounter: Payer: Self-pay | Admitting: Occupational Therapy

## 2020-05-25 ENCOUNTER — Other Ambulatory Visit: Payer: Self-pay

## 2020-05-25 VITALS — BP 140/66 | HR 93

## 2020-05-25 DIAGNOSIS — R41841 Cognitive communication deficit: Secondary | ICD-10-CM

## 2020-05-25 DIAGNOSIS — R2681 Unsteadiness on feet: Secondary | ICD-10-CM

## 2020-05-25 DIAGNOSIS — M6281 Muscle weakness (generalized): Secondary | ICD-10-CM

## 2020-05-25 DIAGNOSIS — R29818 Other symptoms and signs involving the nervous system: Secondary | ICD-10-CM

## 2020-05-25 DIAGNOSIS — I69354 Hemiplegia and hemiparesis following cerebral infarction affecting left non-dominant side: Secondary | ICD-10-CM

## 2020-05-25 DIAGNOSIS — R4701 Aphasia: Secondary | ICD-10-CM

## 2020-05-25 DIAGNOSIS — R278 Other lack of coordination: Secondary | ICD-10-CM

## 2020-05-25 NOTE — Addendum Note (Signed)
Addended by: Verdie Mosher B on: 05/25/2020 04:56 PM   Modules accepted: Orders

## 2020-05-25 NOTE — Therapy (Signed)
[facility] [address] [place], [place], [address] Phone: [phone]   Fax:  [phone]  Occupational Therapy Treatment  Patient Details  Name: Jill Rose MRN: [id number] Date of Birth: June 18, 1941 No data recorded  Encounter Date: 05/25/2020   OT End of Session - 05/25/20 1704    Visit Number 11    Number of Visits 17    Date for OT Re-Evaluation 07/15/20    Authorization Type Aetna MCR    Authorization - Visit Number 11    Progress Note Due on Visit 20    OT Start Time 1615    OT Stop Time 1700    OT Time Calculation (min) 45 min    Activity Tolerance Patient tolerated treatment well    Behavior During Therapy [facility] for tasks assessed/performed           Past Medical History:  Diagnosis Date   Diabetes mellitus without complication ([place])    Essential hypertension    High cholesterol    Hypothyroidism    Stroke [facility])    TIA (transient ischemic attack) 2018    History reviewed. No pertinent surgical history.  There were no vitals filed for this visit.   Subjective Assessment - 05/25/20 1631    Subjective  Patient prepared Pearletha Furl - indian dish with vegetables and lentils    Pertinent History Rt MCA CVA 01/05/20. PMH: HTN, HLD, T2DM, Diabetic neuropathy    Limitations no driving, fall risk, HOH    Patient Stated Goals return to cooking    Currently in Pain? No/denies    Pain Score 0-No pain                        OT Treatments/Exercises (OP) - 05/25/20 0001      ADLs   Functional Mobility Worked on static to dynamic standing balance while using BUE's for a functional familair task - folding laundry.  Patient able to use BUE effectively.         Neurological Re-education Exercises   Other Exercises 1 Neuromuscular reeducation to address lower body initiated weight shifts - seated on physioball.  ALso reducing UE reliance for sit to stand from ball - encouraging forward weight  shift in midline onto BLE                    OT Short Term Goals - 05/25/20 1705      OT SHORT TERM GOAL #1   Title Independent with coordination HEP Lt hand    Time 4    Period Weeks    Status Partially Met   Issued coordination HEP 03/19/20  Patient cpmpletes with assitance from CNA 05/18/20     OT [place] #2   Title Pt to perform UE dressing with min assist or less and LE dressing with mod assist or less    Time 4    Period Weeks    Status Achieved   Pt reports doing approximately 50% of UB dressing 03/19/20     OT SHORT TERM GOAL #3   Title Pt to perform simple snack prep/cold meal prep standing level with DME and supervision prn    Time 4    Period Weeks    Status Deferred   Pt reports assisting with cutting vegetables with husband 03/19/20 - seated.     OT SHORT TERM GOAL #4   Title Pt to perform sustained high level reaching LUE for  endurance    Time 4    Period Weeks    Status Achieved      OT SHORT TERM GOAL #5   Title Pt to return to putting up/styling hair mod I level    Time 4    Period Weeks    Status Partially Met   Pt reports needing assistance for clasps and barettes            OT Long Term Goals - 05/25/20 1705      OT LONG TERM GOAL #1   Title Independent with strengthening HEP for LUE    Time 8    Period Weeks    Status On-going      OT LONG TERM GOAL #2   Title Improve coordination Lt hand as evidenced by performing 9 hole peg test in 41 sec or less    Baseline 48.59 sec    Time 8    Period Weeks    Status On-going      OT LONG TERM GOAL #3   Title Pt to be mod I level with all BADLS    Time 8    Period Weeks    Status On-going      OT LONG TERM GOAL #4   Title Pt to cook simple meal with DME prn and distant supervision    Time 8    Period Weeks    Status On-going      OT LONG TERM GOAL #5   Title Pt to perform light cleaning tasks (washing dishes, folding laundry, etc) mod I level    Time 8    Period Weeks     Status On-going                 Plan - 05/25/20 1704    Clinical Impression Statement Patient is beginnig to gain confidence with functional mobility and is encouraged by her progress    OT Occupational Profile and History Detailed Assessment- Review of Records and additional review of physical, cognitive, psychosocial history related to current functional performance    Occupational performance deficits (Please refer to evaluation for details): ADL's;IADL's    Body Structure / Function / Physical Skills ADL;ROM;Balance;Body mechanics;Mobility;Flexibility;Strength;Coordination;FMC;UE functional use;Decreased knowledge of use of DME    Rehab Potential Good    Clinical Decision Making Several treatment options, min-mod task modification necessary    Comorbidities Affecting Occupational Performance: May have comorbidities impacting occupational performance    Modification or Assistance to Complete Evaluation  Min-Moderate modification of tasks or assist with assess necessary to complete eval    OT Frequency 2x / week    OT Duration 8 weeks   Plus eval   OT Treatment/Interventions Self-care/ADL training;Therapeutic exercise;Functional Mobility Training;Neuromuscular education;Manual Therapy;Therapeutic activities;Coping strategies training;DME and/or AE instruction;Cognitive remediation/compensation;Moist Heat;Passive range of motion;Patient/family education    Plan work on wt shifts in standing, functional transfers, and ADL    Consulted and Agree with Plan of Care Patient;Family member/caregiver    Family Member Consulted caregiver CNA           Patient will benefit from skilled therapeutic intervention in order to improve the following deficits and impairments:   Body Structure / Function / Physical Skills: ADL, ROM, Balance, Body mechanics, Mobility, Flexibility, Strength, Coordination, FMC, UE functional use, Decreased knowledge of use of DME       Visit Diagnosis: Hemiplegia  and hemiparesis following cerebral infarction affecting left non-dominant side (HCC)  Unsteadiness on feet  Muscle weakness (generalized)  Other  lack of coordination  Other symptoms and signs involving the nervous system    Problem List Patient Active Problem List   Diagnosis Date Noted   Controlled type 2 diabetes mellitus with hyperglycemia, without long-term current use of insulin ([place]) 04/06/2020   Abnormality of gait 02/03/2020   Benign essential HTN    CKD (chronic kidney disease), stage II    Hemiparesis affecting left side as late effect of stroke ([place])    Labile blood glucose    Labile blood pressure    Elevated BUN    Hyponatremia    Diabetic peripheral neuropathy ([place])    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   Dyslipidemia    Class 1 obesity due to excess calories with serious comorbidity and body mass index (BMI) of 30.0 to 30.9 in adult    Left hemiparesis (HCC)    Controlled type 2 diabetes mellitus with hyperglycemia ([place])    Morbid obesity (HCC)    Leukocytosis    History of TIA (transient ischemic attack)    Acute CVA (cerebrovascular accident) ([place]) 01/06/2020   Acute ischemic stroke ([place]) 01/05/2020   Hypertensive emergency 01/26/2017   TIA (transient ischemic attack) 01/26/2017   Hypothyroidism 01/26/2017   Aphasia    Essential hypertension    Diabetes mellitus without complication ([place])     Mariah Milling, OTR/L 05/25/2020, 5:06 PM  [facility] [address] [place], [place], [address] Phone: [phone]   Fax:  [phone]  Name: Jill Rose MRN: [id number] Date of Birth: 10/12/1940

## 2020-05-25 NOTE — Patient Instructions (Signed)
Continue to journal!  Please complete the assigned speech therapy homework prior to your next session and return it to the speech therapist at your next visit.

## 2020-05-25 NOTE — Therapy (Signed)
[facility] [address] [place], [place], [address] Phone: [phone]   Fax:  [phone]  Physical Therapy Treatment/Re-Cert  Patient Details  Name: Jill Rose MRN: [id number] Date of Birth: 20-Apr-1941 Referring Provider (PT): Fortino Sic, [place] (being followed by Dr. Posey Pronto)   Encounter Date: 05/25/2020   PT End of Session - 05/25/20 1730    Visit Number 16    Number of Visits 24    Date for PT Re-Evaluation 06/25/20   written for additional 4 week POC   Authorization Type Aetna Medicare- $35 co pay per day regardless of discipline    PT Start Time 6720    PT Stop Time 1531    PT Time Calculation (min) 44 min    Equipment Utilized During Treatment Gait belt    Activity Tolerance Patient tolerated treatment well;Patient limited by fatigue    Behavior During Therapy [facility] for tasks assessed/performed           Past Medical History:  Diagnosis Date  . Diabetes mellitus without complication ([place])   . Essential hypertension   . High cholesterol   . Hypothyroidism   . Stroke ([place])   . TIA (transient ischemic attack) 2018    No past surgical history on file.  Vitals:   05/25/20 1452  BP: 140/66  Pulse: 93     Subjective Assessment - 05/25/20 1452    Subjective Got her COVID booster shot on friday - arm still feeling a little bit sore.    Patient is accompained by: Family member   CNA   Pertinent History diabetes mellitus, HTN, hypothyroidism, TIA (2018)    Patient Stated Goals wants to be walking more.    Currently in Pain? Yes    Pain Score 4     Pain Location Arm    Pain Orientation Right    Pain Descriptors / Indicators Sore   itchy   Pain Type Acute pain   from COVID booster   Pain Onset More than a month ago    Aggravating Factors  nothing    Pain Relieving Factors doesn't know              [facility] PT Assessment - 05/25/20 1507      Timed Up and Go Test   Normal TUG (seconds) --    3 minutes 57 seconds   TUG Comments with RW                         [facility] Adult PT Treatment/Exercise - 05/25/20 0001      Transfers   Transfers Sit to Stand;Stand to Sit    Sit to Stand 4: Min guard;With upper extremity assist;From chair/3-in-1    Sit to Stand Details Tactile cues for weight shifting;Verbal cues for sequencing;Verbal cues for technique;Verbal cues for safe use of DME/AE;Manual facilitation for weight shifting;Manual facilitation for placement    Sit to Stand Details (indicate cue type and reason) pt needing cues to scoot out towards edge of w/c and proper UE placement before standing as pt still with tendency to have BUE on RW and try to push up to stand    Stand to Sit 4: Min guard;With upper extremity assist;To chair/3-in-1      Ambulation/Gait   Ambulation/Gait Yes    Ambulation/Gait Assistance 4: Min guard;4: Min assist    Ambulation/Gait Assistance Details facilitation for weight shifting for incr step length and helping with RW propulsion,  pt needing max encouragement to take longer steps, with pt intermittently being able to perform a partial step through pattern vs. step to    Ambulation Distance (Feet) 38 Feet   x1   Assistive device Rolling walker    Gait Pattern Step-to pattern;Decreased step length - right;Decreased step length - left;Decreased dorsiflexion - left;Decreased dorsiflexion - right;Right foot flat;Left foot flat;Poor foot clearance - left;Poor foot clearance - right;Shuffle;Step-through pattern;Decreased stance time - right;Decreased stride length    Ambulation Surface Level;Indoor    Gait velocity .21 ft/sec   2 minutes and 33 seconds in 20 ft             Access Code: [email or web address] URL: [email or web address] Date: 05/25/2020 Prepared by: Janann August  Began to review bolded exercises listed below for HEP: with caregiver [name] present   Exercises Seated Heel Toe Raises - 1 x daily - 5 x weekly - 1 sets - 10  reps Seated Knee Extension with Resistance - 1 x daily - 5 x weekly - 1 sets - 10 reps - with yellow tband, verbal and demo cues for technique and slowing motion down  Seated Hamstring Curl with Anchored Resistance - 1 x daily - 5 x weekly - 1 sets - 10 reps - began to demo with pt and pt's caregiver as she reports they have not been doing at home, will further review at next session  Seated March with Resistance - 1 x daily - 5 x weekly - 1 sets - 10 reps Seated Hip Abduction with Resistance - 1 x daily - 5 x weekly - 1 sets - 10 reps Seated Hip Adduction Isometrics with Ball - 1 x daily - 5 x weekly - 1 sets - 10 reps - 5 hold Sit to Stand with Counter Support - 1 x daily - 5 x weekly - 1 sets - 5 reps       PT Education - 05/25/20 1730    Education Details began to review HEP, progress towards goals, will continue with therapy through end of current scheduled visits.    Person(s) Educated Patient;Caregiver(s)    Methods Explanation;Demonstration    Comprehension Verbalized understanding;Returned demonstration;Need further instruction            PT Short Term Goals - 05/25/20 2100      PT SHORT TERM GOAL #1   Title ALL STGS = LTGS             PT Long Term Goals - 05/25/20 1508      PT LONG TERM GOAL #1   Title Pt will be independent with final HEP in order to build upon functional gains made in therapy. ALL LTGS DUE 05/28/20    Baseline 05/25/20 - did not have the chance to fully review    Time 4    Period Weeks    Status On-going      PT LONG TERM GOAL #2   Title Pt will decrease TUG time to 3 minutes and 30 seconds or less with RW in order to demo decr fall risk.    Baseline 04/22/20: 5 minutes 59.25 sec's, increased from last assessment, 05/25/20: 3 MINUTES 57 seconds improved from 04/22/20    Time 4    Period Weeks    Status Not Met      PT LONG TERM GOAL #3   Title Pt will improve BERG score to at least a 28/56 in order to demo decr fall risk.    Baseline 04/22/20:  19/56, decreased from 23/56    Time 4    Period Weeks    Status Deferred      PT LONG TERM GOAL #4   Title Pt will decr 5x sit <> stand time to 40 seconds or less from mat table with UE support in order to demo improved functional BLE strength.    Baseline 04/22/20: 51.56 sec's with UE support from standard height chair, improved from 53.79 sec's just not to goal    Time 4    Period Weeks    Status Deferred      PT LONG TERM GOAL #6   Title Pt will perform all bed mobility with supervision in order to decr caregiver burden.    Baseline 04/22/20: unable to assess due to time constraints    Time 4    Period Weeks    Status Deferred              Revised/on-going LTGs for re-cert:      PT Long Term Goals - 05/25/20 2111      PT LONG TERM GOAL #1   Title Pt will be independent with final HEP in order to build upon functional gains made in therapy. ALL LTGS DUE 06/22/20    Baseline 05/25/20 - did not have the chance to fully review    Time 4    Period Weeks    Status On-going    Target Date 06/22/20      PT LONG TERM GOAL #2   Title Pt will decrease TUG time to 3 minutes and 30 seconds or less with RW in order to demo decr fall risk.    Baseline 04/22/20: 5 minutes 59.25 sec's, increased from last assessment, 05/25/20: 3 MINUTES 57 seconds improved from 04/22/20    Time 4    Period Weeks    Status On-going      PT LONG TERM GOAL #3   Title Pt will ambulate at least 82' consecutively with RW with min guard in order to demo improved household mobility.    Baseline 40' with RW with min guard/min A    Time 4    Period Weeks    Status Revised      PT LONG TERM GOAL #4   Title Pt will decr 5x sit <> stand time to 40 seconds or less from mat table with UE support in order to demo improved functional BLE strength.    Baseline 04/22/20: 51.56 sec's with UE support from standard height chair, improved from 53.79 sec's just not to goal    Time 4    Period Weeks    Status On-going                05/25/20 2108  Plan  Clinical Impression Statement Began assessing LTGs today (goals due this week however pt only scheduled for one PT visit this week). Pt did not meet LTG #2 - performed TUG today in 3 minutes and 57 seconds, improved from 5 minutes and 59 seconds, but not quite to goal level. Began to review HEP, but ran out of time at the end to fully review with pt and caregiver. Deferred remainder of LTGs at this time due to time constraints. Pt ambulated a single bout of gait of 28' today with min guard/min A, which is the farthest pt has walked in therapy in a consecutive distance in quite some time. Pt continues to need cues for incr step length B. Discussed with pt and pt's  caregiver will re-cert for an additional 3-4 weeks until the end of the month (pt is scheduled out that far) and then will anticipate D/C if pt has not made any significant improvement, pt and caregiver seem to be in agreement. Since new caregiver has been with pt, pt has been doing more walking and exercises at home. LTGs revised as appropriate.  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  Examination-Activity Limitations Bed Mobility;Bend;Locomotion Level;Stairs;Stand;Transfers;Squat  Examination-Participation Restrictions Community Activity  Pt will benefit from skilled therapeutic intervention in order to improve on the following deficits Abnormal gait;Decreased balance;Decreased activity tolerance;Decreased coordination;Decreased endurance;Decreased knowledge of use of DME;Decreased range of motion;Difficulty walking;Decreased strength;Postural dysfunction  Stability/Clinical Decision Making Stable/Uncomplicated  Rehab Potential Good  PT Frequency 2x / week  PT Duration 4 weeks (3-4)  PT Treatment/Interventions ADLs/Self Care Home Management;Gait  training;DME Instruction;Stair training;Functional mobility training;Therapeutic activities;Neuromuscular re-education;Balance training;Therapeutic exercise;Patient/family education;Orthotic Fit/Training;Energy conservation;Passive range of motion  PT Next Visit Plan review HEP. any kind of LE strengthening. standing balance with decr UE support, continue with nustep for stengthening/activity tolerance/working on increased speed with mobility, needs transfer/turning training with RW. stepping strategies to promote increased step length,  Consulted and Agree with Plan of Care Patient;Family member/caregiver  Family Member Consulted CNA [name]      Patient will benefit from skilled therapeutic intervention in order to improve the following deficits and impairments:     Visit Diagnosis: Unsteadiness on feet  Muscle weakness (generalized)  Other symptoms and signs involving the nervous system  Other lack of coordination     Problem List Patient Active Problem List   Diagnosis Date Noted  . Controlled type 2 diabetes mellitus with hyperglycemia, without long-term current use of insulin ([place]) 04/06/2020  . Abnormality of gait 02/03/2020  . Benign essential HTN   . CKD (chronic kidney disease), stage II   . Hemiparesis affecting left side as late effect of stroke ([place])   . Labile blood glucose   . Labile blood pressure   . Elevated BUN   . Hyponatremia   . Diabetic peripheral neuropathy ([place])   . 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 Dyslipidemia   . Class 1 obesity due to excess calories with serious comorbidity and body mass index (BMI) of 30.0 to 30.9 in adult   . Left hemiparesis ([place])   . Controlled type 2 diabetes mellitus with hyperglycemia ([place])   . Morbid obesity ([place])   . Leukocytosis   . History of TIA (transient ischemic attack)   . Acute CVA (cerebrovascular accident) ([place]) 01/06/2020  . Acute ischemic stroke ([place]) 01/05/2020  . Hypertensive  emergency 01/26/2017  . TIA (transient ischemic attack) 01/26/2017  . Hypothyroidism 01/26/2017  . Aphasia   . Essential hypertension   . Diabetes mellitus without complication ([place])     Arliss Journey, PT, DPT  05/25/2020, 9:03 PM  [facility] [address] [place], [place], [address] Phone: [phone]   Fax:  [phone]  Name: ISOBEL EISENHUTH MRN: [id number] Date of Birth: September 05, 1940

## 2020-05-25 NOTE — Therapy (Signed)
[facility] [address] [place], [place], [address] Phone: [phone]   Fax:  [phone]  Speech Language Pathology Treatment  Patient Details  Name: Jill Rose MRN: [id number] Date of Birth: 02-Oct-1940 Referring Provider (SLP): Jill Arn, MD   Encounter Date: 05/25/2020   End of Session - 05/25/20 1650    Visit Number 13    Number of Visits 17    Date for SLP Re-Evaluation 07/13/20    SLP Start Time 1536    SLP Stop Time  1616    SLP Time Calculation (min) 40 min    Activity Tolerance Patient tolerated treatment well           Past Medical History:  Diagnosis Date  . Diabetes mellitus without complication ([place])   . Essential hypertension   . High cholesterol   . Hypothyroidism   . Stroke ([place])   . TIA (transient ischemic attack) 2018    History reviewed. No pertinent surgical history.  There were no vitals filed for this visit.   Subjective Assessment - 05/25/20 1545    Subjective "We have been doing a lot of homework."    Patient is accompained by: --   Jill Rose - aide   Pain Score 4     Pain Location Arm    Pain Orientation Right    Pain Descriptors / Indicators Sore    Pain Type Acute pain                 ADULT SLP TREATMENT - 05/25/20 1547      General Information   Behavior/Cognition Alert;Cooperative;Requires cueing;Distractible;Decreased sustained attention      Treatment Provided   Treatment provided Cognitive-Linquistic      Cognitive-Linquistic Treatment   Treatment focused on Cognition;Patient/family/caregiver education    Skilled Treatment Pt with greater attention today shown to SLP with ability to organize 6 journal sheets from [place] and November - she req'd much extra time and cues x2 back to task. SLP cont to train aide not to jump in after 3 seconds to complete the task for the pt, and instead allow pt time to process message asked/what is required of her.        Assessment / Recommendations / Plan   Plan Continue with current plan of care      Progression Toward Goals   Progression toward goals Progressing toward goals              SLP Short Term Goals - 05/14/20 1129      SLP SHORT TERM GOAL #1   Title pt will *attempt to access* her memory system for simple organization of discipline-specific items, or for temporal orientation x5 sessions    Time 1    Period Weeks    Status Not Met      SLP SHORT TERM GOAL #2   Title pt will demo knowledge of items in her memory system/notebook by correctly accessing them in 2 sessions    Time 1    Period Weeks    Status Not Met      SLP SHORT TERM GOAL #3   Title pt will demonstrate error awareness in therapy tasks 75% with min cues for re-check in 3 sessions    Time 1    Period Weeks    Status Not Met      SLP SHORT TERM GOAL #4   Title pt will name 8 items for simple categories x 3 sessions  Time 1    Period Weeks    Status Not Met            SLP Long Term Goals - 05/25/20 1700      SLP LONG TERM GOAL #1   Title pt will demo correct usage of a memory compensation system for appointment and/or logging blood pressure and blood sugar, daily schedules in 3 sessions    Time 4    Period Weeks    Status On-going      SLP LONG TERM GOAL #2   Title Pt will maintain selective attention in functional task for 8 minutes in mod noisy environment with rare min cues x 2 sessions.    Time 4    Period Weeks    Status On-going      SLP LONG TERM GOAL #3   Title pt will demo anticipatory awareness regarding deficit areas, independently, in 3 sessions    Time 4    Period Weeks    Status On-going      SLP LONG TERM GOAL #4   Title pt will use compensations for anomia in 8 minutes simple conversation x3 visits    Time 4    Period Weeks    Status On-going            Plan - 05/25/20 1657    Clinical Impression Statement Jill Rose presents with overall moderate cognitive  communication deficits. Suspect attention, primarily, and slow processing contributes to this difficulty. Pt has seen better carryover in home environment with therapy tasks due to [place], one of pt's caregivers who is consistently with her at therapy, is working with pt and SLP cont to train her how to properly cue pt. Based on what SLP saw SLP renewed pt for 8 more sessions - however if progress does not continue to be shown during this time, pt will likely be put on hold or discharged. Prior to her CVA pt enjoyed cooking and reading about natural medicine/ ayurveda. I recommend skilled ST to address cognitive and language deficits for better communication with family and friends, to increase independence and improve quality of life.    Speech Therapy Frequency 2x / week    Duration --   5 weeks or 20 total visits   Treatment/Interventions SLP instruction and feedback;Functional tasks;Cognitive reorganization;Compensatory strategies;Internal/external aids;Multimodal communcation approach;Patient/family education;Environmental controls;Language facilitation;Compensatory techniques    Potential to Achieve Goals Good    Consulted and Agree with Plan of Care Patient           Patient will benefit from skilled therapeutic intervention in order to improve the following deficits and impairments:   Cognitive communication deficit  Aphasia    Problem List Patient Active Problem List   Diagnosis Date Noted  . Controlled type 2 diabetes mellitus with hyperglycemia, without long-term current use of insulin ([place]) 04/06/2020  . Abnormality of gait 02/03/2020  . Benign essential HTN   . CKD (chronic kidney disease), stage II   . Hemiparesis affecting left side as late effect of stroke ([place])   . Labile blood glucose   . Labile blood pressure   . Elevated BUN   . Hyponatremia   . Diabetic peripheral neuropathy ([place])   . 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 Dyslipidemia   . Class 1  obesity due to excess calories with serious comorbidity and body mass index (BMI) of 30.0 to 30.9 in adult   . Left hemiparesis ([place])   . Controlled type 2  diabetes mellitus with hyperglycemia ([place])   . Morbid obesity ([place])   . Leukocytosis   . History of TIA (transient ischemic attack)   . Acute CVA (cerebrovascular accident) ([place]) 01/06/2020  . Acute ischemic stroke ([place]) 01/05/2020  . Hypertensive emergency 01/26/2017  . TIA (transient ischemic attack) 01/26/2017  . Hypothyroidism 01/26/2017  . Aphasia   . Essential hypertension   . Diabetes mellitus without complication ([place])     [facility] ,[place], [place]  05/25/2020, 5:01 PM  [facility] [address] [place], [place], [address] Phone: [phone]   Fax:  [phone]   Name: Jill Rose MRN: [id number] Date of Birth: [birth year]

## 2020-05-27 ENCOUNTER — Other Ambulatory Visit: Payer: Self-pay

## 2020-05-27 ENCOUNTER — Ambulatory Visit: Payer: Medicare HMO

## 2020-05-27 ENCOUNTER — Ambulatory Visit: Payer: Medicare HMO | Admitting: Occupational Therapy

## 2020-05-27 DIAGNOSIS — R2681 Unsteadiness on feet: Secondary | ICD-10-CM

## 2020-05-27 DIAGNOSIS — R41841 Cognitive communication deficit: Secondary | ICD-10-CM

## 2020-05-27 DIAGNOSIS — I69354 Hemiplegia and hemiparesis following cerebral infarction affecting left non-dominant side: Secondary | ICD-10-CM

## 2020-05-27 DIAGNOSIS — R4701 Aphasia: Secondary | ICD-10-CM

## 2020-05-27 DIAGNOSIS — R278 Other lack of coordination: Secondary | ICD-10-CM

## 2020-05-27 NOTE — Therapy (Signed)
[facility] [address] [place], [place], [address] Phone: [phone]   Fax:  [phone]  Occupational Therapy Treatment  Patient Details  Name: Jill Rose MRN: [id number] Date of Birth: [birth year] No data recorded  Encounter Date: 05/27/2020   OT End of Session - 05/27/20 1442    Visit Number 12    Number of Visits 17    Date for OT Re-Evaluation 07/15/20    Authorization Type [employer] MCR    Authorization - Visit Number 12    Progress Note Due on Visit 20    OT Start Time 1400    OT Stop Time 1445    OT Time Calculation (min) 45 min    Activity Tolerance Patient tolerated treatment well    Behavior During Therapy [facility] for tasks assessed/performed           Past Medical History:  Diagnosis Date  . Diabetes mellitus without complication ([place])   . Essential hypertension   . High cholesterol   . Hypothyroidism   . Stroke ([place])   . TIA (transient ischemic attack) 2018    No past surgical history on file.  There were no vitals filed for this visit.   Subjective Assessment - 05/27/20 1359    Subjective  I got a booster shot last Friday    Pertinent History Rt MCA CVA 01/05/20. PMH: HTN, HLD, T2DM, Diabetic neuropathy    Limitations no driving, fall risk, HOH    Patient Stated Goals return to cooking    Currently in Pain? Yes    Pain Score 4     Pain Location Arm    Pain Orientation Right    Pain Descriptors / Indicators Sore    Pain Type Acute pain    Pain Onset In the past 7 days    Pain Frequency Constant    Aggravating Factors  nothing    Pain Relieving Factors nothing            Worked on sit to stand x 3 with forward weight shift and less reliance on UE's. Pt initially not forward enough with weight on heels but improved with repetition and simple cueing.  Standing at countertop with reaching activities to Rt and Lt side for wt shifts - pt cued to move bigger for increased wt shift and to  increase anterior pelvic tilt. Pt standing for up to 8 minutes at a time x 3 Seated: placing medium sized pegs in pegboard Lt hand, 2 rows, then removing Reviewed strategies for donning shirt with hemiplegic side first.                       OT Short Term Goals - 05/25/20 1705      OT SHORT TERM GOAL #1   Title Independent with coordination HEP Lt hand    Time 4    Period Weeks    Status Partially Met   Issued coordination HEP 03/19/20  Patient cpmpletes with assitance from CNA 05/18/20     OT [place] #2   Title Pt to perform UE dressing with min assist or less and LE dressing with mod assist or less    Time 4    Period Weeks    Status Achieved   Pt reports doing approximately 50% of UB dressing 03/19/20     OT SHORT TERM GOAL #3   Title Pt to perform simple snack prep/cold meal prep standing level with DME  and supervision prn    Time 4    Period Weeks    Status Deferred   Pt reports assisting with cutting vegetables with husband 03/19/20 - seated.     OT SHORT TERM GOAL #4   Title Pt to perform sustained high level reaching LUE for endurance    Time 4    Period Weeks    Status Achieved      OT SHORT TERM GOAL #5   Title Pt to return to putting up/styling hair mod I level    Time 4    Period Weeks    Status Partially Met   Pt reports needing assistance for clasps and barettes            OT Long Term Goals - 05/25/20 1705      OT LONG TERM GOAL #1   Title Independent with strengthening HEP for LUE    Time 8    Period Weeks    Status On-going      OT LONG TERM GOAL #2   Title Improve coordination Lt hand as evidenced by performing 9 hole peg test in 41 sec or less    Baseline 48.59 sec    Time 8    Period Weeks    Status On-going      OT LONG TERM GOAL #3   Title Pt to be mod I level with all BADLS    Time 8    Period Weeks    Status On-going      OT LONG TERM GOAL #4   Title Pt to cook simple meal with DME prn and distant supervision      Time 8    Period Weeks    Status On-going      OT LONG TERM GOAL #5   Title Pt to perform light cleaning tasks (washing dishes, folding laundry, etc) mod I level    Time 8    Period Weeks    Status On-going                 Plan - 05/27/20 1442    Clinical Impression Statement Pt with reluctance/very hesistant to weight shift. Pt also tends to lean posteriorly and does not shift weight forward sufficiently for sit to stand, however improves with repetition.    OT Occupational Profile and History Detailed Assessment- Review of Records and additional review of physical, cognitive, psychosocial history related to current functional performance    Occupational performance deficits (Please refer to evaluation for details): ADL's;IADL's    Body Structure / Function / Physical Skills ADL;ROM;Balance;Body mechanics;Mobility;Flexibility;Strength;Coordination;FMC;UE functional use;Decreased knowledge of use of DME    Rehab Potential Good    Clinical Decision Making Several treatment options, min-mod task modification necessary    Comorbidities Affecting Occupational Performance: May have comorbidities impacting occupational performance    Modification or Assistance to Complete Evaluation  Min-Moderate modification of tasks or assist with assess necessary to complete eval    OT Frequency 2x / week    OT Duration 8 weeks   Plus eval   OT Treatment/Interventions Self-care/ADL training;Therapeutic exercise;Functional Mobility Training;Neuromuscular education;Manual Therapy;Therapeutic activities;Coping strategies training;DME and/or AE instruction;Cognitive remediation/compensation;Moist Heat;Passive range of motion;Patient/family education    Plan continue wt shifts in standing (maybe in kitchen in prep for cooking), ADLS    Consulted and Agree with Plan of Care Patient;Family member/caregiver    Family Member Consulted caregiver CNA           Patient will benefit from skilled  therapeutic  intervention in order to improve the following deficits and impairments:   Body Structure / Function / Physical Skills: ADL, ROM, Balance, Body mechanics, Mobility, Flexibility, Strength, Coordination, FMC, UE functional use, Decreased knowledge of use of DME       Visit Diagnosis: Hemiplegia and hemiparesis following cerebral infarction affecting left non-dominant side (HCC)  Unsteadiness on feet  Other lack of coordination    Problem List Patient Active Problem List   Diagnosis Date Noted  . Controlled type 2 diabetes mellitus with hyperglycemia, without long-term current use of insulin ([place]) 04/06/2020  . Abnormality of gait 02/03/2020  . Benign essential HTN   . CKD (chronic kidney disease), stage II   . Hemiparesis affecting left side as late effect of stroke ([place])   . Labile blood glucose   . Labile blood pressure   . Elevated BUN   . Hyponatremia   . Diabetic peripheral neuropathy ([place])   . 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 Dyslipidemia   . Class 1 obesity due to excess calories with serious comorbidity and body mass index (BMI) of 30.0 to 30.9 in adult   . Left hemiparesis ([place])   . Controlled type 2 diabetes mellitus with hyperglycemia ([place])   . Morbid obesity ([place])   . Leukocytosis   . History of TIA (transient ischemic attack)   . Acute CVA (cerebrovascular accident) ([place]) 01/06/2020  . Acute ischemic stroke ([place]) 01/05/2020  . Hypertensive emergency 01/26/2017  . TIA (transient ischemic attack) 01/26/2017  . Hypothyroidism 01/26/2017  . Aphasia   . Essential hypertension   . Diabetes mellitus without complication ([place])     Carey Bullocks, OTR/L 05/27/2020, 3:03 PM  [facility] [address] [place], [place], [address] Phone: [phone]   Fax:  [phone]  Name: JAYDY FITZHENRY MRN: [id number] Date of Birth: 03-30-1941

## 2020-05-27 NOTE — Therapy (Signed)
[facility] [address] [place], [place], [address] Phone: [phone]   Fax:  [phone]  Speech Language Pathology Treatment  Patient Details  Name: ONIE KASPAREK MRN: [id number] Date of Birth: 1940-11-01 Referring Provider (SLP): Jamse Arn, MD   Encounter Date: 05/27/2020   End of Session - 05/27/20 1605    Visit Number 14    Number of Visits 17    Date for SLP Re-Evaluation 07/13/20    SLP Start Time 1448    SLP Stop Time  1530    SLP Time Calculation (min) 42 min    Activity Tolerance Patient tolerated treatment well           Past Medical History:  Diagnosis Date  . Diabetes mellitus without complication ([place])   . Essential hypertension   . High cholesterol   . Hypothyroidism   . Stroke ([place])   . TIA (transient ischemic attack) 2018    History reviewed. No pertinent surgical history.  There were no vitals filed for this visit.   Subjective Assessment - 05/27/20 1508    Subjective Pt arrives today, somnoent, without notebook. Aid today dropped off patient and was unable to stay. Aid [name] was called and attended ST and then picked patient up.    Patient is accompained by: --   aide, [name]   Currently in Pain? Yes    Pain Score 4     Pain Location Arm    Pain Orientation Right    Pain Descriptors / Indicators Sore                 ADULT SLP TREATMENT - 05/27/20 1510      General Information   Behavior/Cognition Alert;Cooperative;Requires cueing;Distractible;Decreased sustained attention      Treatment Provided   Treatment provided Cognitive-Linquistic      Cognitive-Linquistic Treatment   Treatment focused on Cognition;Patient/family/caregiver education    Skilled Treatment Pt's aide did not bring pt, she was asked to take her home. Pt recalled what she and SLP did last session. Pt req'd mod A to reason what day today is and whta tomorrow is. SLP told pt to ask aide for  journgal tomorrow instead of aid puttin gjornal in front of pt. Pt agreed. SLP educated aide to not give journal to patient but to allow pt to ask her and if she does not do so within 45 minutes of aid's arrivall aide should give pt a clue as to what she is to do with the journal. Pt sorted 24 pictures with two errors that she did not catch when doing the task - but caught with SLP-cued double checking.       Assessment / Recommendations / Plan   Plan Continue with current plan of care      Progression Toward Goals   Progression toward goals Progressing toward goals              SLP Short Term Goals - 05/14/20 1129      SLP SHORT TERM GOAL #1   Title pt will *attempt to access* her memory system for simple organization of discipline-specific items, or for temporal orientation x5 sessions    Time 1    Period Weeks    Status Not Met      SLP SHORT TERM GOAL #2   Title pt will demo knowledge of items in her memory system/notebook by correctly accessing them in 2 sessions    Time 1  Period Weeks    Status Not Met      SLP SHORT TERM GOAL #3   Title pt will demonstrate error awareness in therapy tasks 75% with min cues for re-check in 3 sessions    Time 1    Period Weeks    Status Not Met      SLP SHORT TERM GOAL #4   Title pt will name 8 items for simple categories x 3 sessions    Time 1    Period Weeks    Status Not Met            SLP Long Term Goals - 05/27/20 1612      SLP LONG TERM GOAL #1   Title pt will demo correct usage of a memory compensation system for appointment and/or logging blood pressure and blood sugar, daily schedules in 3 sessions    Time 4    Period Weeks    Status On-going      SLP LONG TERM GOAL #2   Title Pt will maintain selective attention in functional task for 8 minutes in mod noisy environment with rare min cues x 2 sessions.    Time 4    Period Weeks    Status On-going      SLP LONG TERM GOAL #3   Title pt will demo anticipatory  awareness regarding deficit areas, independently, in 3 sessions    Time 4    Period Weeks    Status On-going      SLP LONG TERM GOAL #4   Title pt will use compensations for anomia in 8 minutes simple conversation x3 visits    Time 4    Period Weeks    Status On-going            Plan - 05/27/20 1605    Clinical Impression Statement Mrs. Schremp presents with overall moderate cognitive communication deficits. Suspect attention, primarily, and slow processing contributes to this difficulty. Today pt held attention for 2 1/2 minutes sorting 30 cards into 4 categories, and a conversation for 2 minutes with SLP leading. Pt has seen better carryover in home environment with therapy tasks due to [facility], one of pt's caregivers who is consistently with her at therapy. SLP cued [name] to only provide cues tomorrow should pt not remember to have her get her journal to track events tomorrow. Based on what SLP saw SLP renewed pt for 8 more sessions - however if progress does not continue to be shown during this time, pt will likely be put on hold or discharged. Prior to her CVA pt enjoyed cooking and reading about natural medicine/ ayurveda. I recommend skilled ST to address cognitive and language deficits for better communication with family and friends, to increase independence and improve quality of life.    Speech Therapy Frequency 2x / week    Duration --   5 weeks or 20 total visits   Treatment/Interventions SLP instruction and feedback;Functional tasks;Cognitive reorganization;Compensatory strategies;Internal/external aids;Multimodal communcation approach;Patient/family education;Environmental controls;Language facilitation;Compensatory techniques    Potential to Achieve Goals Good    Consulted and Agree with Plan of Care Patient           Patient will benefit from skilled therapeutic intervention in order to improve the following deficits and impairments:   Cognitive communication  deficit  Aphasia    Problem List Patient Active Problem List   Diagnosis Date Noted  . Controlled type 2 diabetes mellitus with hyperglycemia, without long-term current use of insulin ([place])  04/06/2020  . Abnormality of gait 02/03/2020  . Benign essential HTN   . CKD (chronic kidney disease), stage II   . Hemiparesis affecting left side as late effect of stroke ([place])   . Labile blood glucose   . Labile blood pressure   . Elevated BUN   . Hyponatremia   . Diabetic peripheral neuropathy ([place])   . 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 Dyslipidemia   . Class 1 obesity due to excess calories with serious comorbidity and body mass index (BMI) of 30.0 to 30.9 in adult   . Left hemiparesis ([place])   . Controlled type 2 diabetes mellitus with hyperglycemia ([place])   . Morbid obesity ([place])   . Leukocytosis   . History of TIA (transient ischemic attack)   . Acute CVA (cerebrovascular accident) ([place]) 01/06/2020  . Acute ischemic stroke ([place]) 01/05/2020  . Hypertensive emergency 01/26/2017  . TIA (transient ischemic attack) 01/26/2017  . Hypothyroidism 01/26/2017  . Aphasia   . Essential hypertension   . Diabetes mellitus without complication ([place])     [facility] ,[place], [place]  05/27/2020, 4:13 PM  [facility] [address] [place], [place], [address] Phone: [phone]   Fax:  [phone]   Name: MOMO BRAUN MRN: [id number] Date of Birth: Jun 02, 1941

## 2020-05-31 ENCOUNTER — Encounter: Payer: Self-pay | Admitting: Physical Medicine & Rehabilitation

## 2020-05-31 ENCOUNTER — Encounter: Payer: Medicare HMO | Attending: Physical Medicine & Rehabilitation | Admitting: Physical Medicine & Rehabilitation

## 2020-05-31 ENCOUNTER — Other Ambulatory Visit: Payer: Self-pay

## 2020-05-31 VITALS — BP 124/68 | HR 92 | Temp 98.2°F | Ht 61.0 in | Wt 155.0 lb

## 2020-05-31 DIAGNOSIS — E1165 Type 2 diabetes mellitus with hyperglycemia: Secondary | ICD-10-CM

## 2020-05-31 DIAGNOSIS — I63511 Cerebral infarction due to unspecified occlusion or stenosis of right middle cerebral artery: Secondary | ICD-10-CM | POA: Diagnosis not present

## 2020-05-31 DIAGNOSIS — I10 Essential (primary) hypertension: Secondary | ICD-10-CM

## 2020-05-31 DIAGNOSIS — I639 Cerebral infarction, unspecified: Secondary | ICD-10-CM | POA: Diagnosis present

## 2020-05-31 DIAGNOSIS — G8194 Hemiplegia, unspecified affecting left nondominant side: Secondary | ICD-10-CM

## 2020-05-31 DIAGNOSIS — R269 Unspecified abnormalities of gait and mobility: Secondary | ICD-10-CM | POA: Diagnosis present

## 2020-05-31 NOTE — Progress Notes (Signed)
Subjective:    Patient ID: Jill Rose, adult    DOB: 20-Apr-1941, 79 y.o.   MRN: [id number]  HPI Right-handed adult with history of diet-controlled diabetes mellitus, hypertension, hypothyroidism, TIA presents for follow up for right MCA infarct.  Last clinic visit on 04/06/2020.  Sister supplements history. Since that time, patient states she is still in therapies. She saw Neuro- notes reviewed, started on Namenda. BP is controlled. She is not checking CBGs. Denies falls. Using wheelchair in community, walker at home. She asks for [facility] therapies.  Pain Inventory Average Pain 4 Pain Right Now 4 My pain is aching  In the last 24 hours, has pain interfered with the following? General activity 4 Relation with others 0 Enjoyment of life 0 What TIME of day is your pain at its worst? morning Sleep (in general) Good  Pain is worse with: standing and some activites Pain improves with: rest and tylenol Relief from Meds:  Only taking tylenol it works sometimes.      Family History  Problem Relation Age of Onset  . Heart disease Mother    Social History   Socioeconomic History  . Marital status: Married    Spouse name: Not on file  . Number of children: Not on file  . Years of education: Not on file  . Highest education level: Not on file  Occupational History  . Not on file  Tobacco Use  . Smoking status: Never Smoker  . Smokeless tobacco: Never Used  Vaping Use  . Vaping Use: Never used  Substance and Sexual Activity  . Alcohol use: No  . Drug use: No  . Sexual activity: Not on file  Other Topics Concern  . Not on file  Social History Narrative   Lives with spouse   Right Handed   Drinks 1 cup caffeine/daily   Social Determinants of Health   Financial Resource Strain:   . Difficulty of Paying Living Expenses: Not on file  Food Insecurity:   . Worried About Programme researcher, broadcasting/film/video in the Last Year: Not on file  . Ran Out of Food in the Last Year: Not on file   Transportation Needs:   . Lack of Transportation (Medical): Not on file  . Lack of Transportation (Non-Medical): Not on file  Physical Activity:   . Days of Exercise per Week: Not on file  . Minutes of Exercise per Session: Not on file  Stress:   . Feeling of Stress : Not on file  Social Connections:   . Frequency of Communication with Friends and Family: Not on file  . Frequency of Social Gatherings with Friends and Family: Not on file  . Attends Religious Services: Not on file  . Active Member of Clubs or Organizations: Not on file  . Attends Banker Meetings: Not on file  . Marital Status: Not on file   History reviewed. No pertinent surgical history. Past Medical History:  Diagnosis Date  . Diabetes mellitus without complication (HCC)   . Essential hypertension   . High cholesterol   . Hypothyroidism   . Stroke (HCC)   . TIA (transient ischemic attack) 2018   BP 124/68   Pulse 92   Temp 98.2 F (36.8 C)   Ht 5\' 1"  (1.549 m)   Wt 155 lb (70.3 kg)   BMI 29.29 kg/m   Opioid Risk Score:   Fall Risk Score:  `1  Depression screen PHQ 2/9  Depression screen [facility] 2/9 05/31/2020 02/03/2020  Decreased Interest 0 0  Down, Depressed, Hopeless 0 2  PHQ - 2 Score 0 2  Altered sleeping - 0  Tired, decreased energy - 3  Change in appetite - 0  Feeling bad or failure about yourself  - 0  Trouble concentrating - 0  Moving slowly or fidgety/restless - 1  Suicidal thoughts - 0  PHQ-9 Score - 6   Review of Systems  Constitutional: Negative.   HENT: Negative.   Eyes: Negative.   Respiratory: Negative.   Cardiovascular: Negative.   Gastrointestinal: Negative.   Endocrine: Negative.   Genitourinary: Negative for urgency.  Musculoskeletal: Positive for gait problem.       Left knee pain  Skin: Negative.   Allergic/Immunologic: Negative.   Neurological: Positive for weakness.  Hematological:       Plavix  Psychiatric/Behavioral: Positive for confusion and  dysphoric mood.  All other systems reviewed and are negative.     Objective:   Physical Exam  Constitutional: No distress . Vital signs reviewed. HENT: Normocephalic.  Atraumatic. Eyes: EOMI. No discharge. Cardiovascular: No JVD.   Respiratory: Normal effort.  No stridor.   GI: Non-distended.   Skin: Warm and dry.  Intact. Psych: Flat. Musc: No edema in extremities.  No tenderness in extremities. Neuro: Alert HOH Motor:  LUE: 4+/5 proximal to distal LLE: 4+/5 proximal to distal LUE: Mild ataxia, Improving    Assessment & Plan:  Right-handed adult with history of diet-controlled diabetes mellitus, hypertension, hypothyroidism, TIA presents for follow up for right MCA infarct.  1. 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 Hypertension.    Continue meds  Controlled today   3. 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  Continue meds  Encouraged checking CBGs again.  4.  Gait abnormality  Continue therapies  Continue walker/wheelchiar for safety, only using wheelchair in community  Avoid treadmill at present  5. Depression  Plans to follow up with PCP

## 2020-06-01 ENCOUNTER — Encounter: Payer: Self-pay | Admitting: Physical Therapy

## 2020-06-01 ENCOUNTER — Ambulatory Visit: Payer: Medicare HMO | Admitting: Physical Therapy

## 2020-06-01 ENCOUNTER — Encounter: Payer: Self-pay | Admitting: Occupational Therapy

## 2020-06-01 ENCOUNTER — Other Ambulatory Visit: Payer: Self-pay | Admitting: Neurology

## 2020-06-01 ENCOUNTER — Ambulatory Visit: Payer: Medicare HMO | Admitting: Occupational Therapy

## 2020-06-01 ENCOUNTER — Other Ambulatory Visit: Payer: Self-pay | Admitting: *Deleted

## 2020-06-01 ENCOUNTER — Ambulatory Visit: Payer: Medicare HMO

## 2020-06-01 DIAGNOSIS — M6281 Muscle weakness (generalized): Secondary | ICD-10-CM

## 2020-06-01 DIAGNOSIS — I69354 Hemiplegia and hemiparesis following cerebral infarction affecting left non-dominant side: Secondary | ICD-10-CM

## 2020-06-01 DIAGNOSIS — R2681 Unsteadiness on feet: Secondary | ICD-10-CM

## 2020-06-01 DIAGNOSIS — R29818 Other symptoms and signs involving the nervous system: Secondary | ICD-10-CM

## 2020-06-01 DIAGNOSIS — R278 Other lack of coordination: Secondary | ICD-10-CM

## 2020-06-01 DIAGNOSIS — R41841 Cognitive communication deficit: Secondary | ICD-10-CM

## 2020-06-01 DIAGNOSIS — R4701 Aphasia: Secondary | ICD-10-CM

## 2020-06-01 MED ORDER — MEMANTINE HCL 10 MG PO TABS: 10.0000 mg | ORAL_TABLET | Freq: Two times a day (BID) | ORAL | 11 refills | Status: DC

## 2020-06-01 NOTE — Patient Instructions (Signed)
Access Code: [email or web address] URL: [email or web address] Date: 06/01/2020 Prepared by: Sherlie Ban  Exercises Seated Heel Toe Raises - 1 x daily - 5 x weekly - 1 sets - 10 reps Seated Knee Extension with Resistance - 1 x daily - 5 x weekly - 1 sets - 10 reps Seated Hamstring Curl with Anchored Resistance - 1 x daily - 5 x weekly - 1 sets - 10 reps Seated March with Resistance - 1 x daily - 5 x weekly - 1 sets - 10 reps Seated Hip Abduction with Resistance - 1 x daily - 5 x weekly - 1 sets - 10 reps Seated Hip Adduction Isometrics with Ball - 1 x daily - 5 x weekly - 1 sets - 10 reps - 5 hold Sit to Stand with Counter Support - 1 x daily - 5 x weekly - 1 sets - 5 reps

## 2020-06-01 NOTE — Patient Instructions (Signed)
Lying on your back - Holding a ball, or paper towel roll between your two hands. Complete each of these exercises slowly 15 times Try to complete twice daily.   Make sure to have the palms of your hand in contact with the ball or roll.    Chest press - push ball from chest upward toward ceiling until elbows are straight.  Newman Pies will be at about the height of your forehead.    Overhead reach - From the chest press position - with elbows straight - move the ball in an arc overhead.  Keep elbows straight and reach behind you - you should have no pain.

## 2020-06-01 NOTE — Therapy (Signed)
[facility] [place] [facility] [address] [place], [place], [address] Phone: [phone]   Fax:  [phone]  Physical Therapy Treatment  Patient Details  Name: Jill Rose MRN: [id number] Date of Birth: 1941/06/30 Referring Provider (PT): Clide Dales, [place] (being followed by Dr. Allena Katz)   Encounter Date: 06/01/2020   PT End of Session - 06/01/20 1625    Visit Number 17    Number of Visits 24    Date for PT Re-Evaluation 06/25/20   written for additional 4 week POC   Authorization Type [employer]- $35 co pay per day regardless of discipline    PT Start Time 1446    PT Stop Time 1529    PT Time Calculation (min) 43 min    Equipment Utilized During Treatment Gait belt    Activity Tolerance Patient tolerated treatment well;Patient limited by fatigue    Behavior During Therapy [facility] for tasks assessed/performed           Past Medical History:  Diagnosis Date  . Diabetes mellitus without complication (HCC)   . Essential hypertension   . High cholesterol   . Hypothyroidism   . Stroke (HCC)   . TIA (transient ischemic attack) 2018    History reviewed. No pertinent surgical history.  There were no vitals filed for this visit.   Subjective Assessment - 06/01/20 1450    Subjective saw dr. Allena Katz yesterday. Reports it is harder to get in and out of an SUV. Reports her L leg was very painful at the end of last night from lifting it in and out of a ca.r    Patient is accompained by: Family member   CNA   Pertinent History diabetes mellitus, HTN, hypothyroidism, TIA (2018)    Patient Stated Goals wants to be walking more.    Currently in Pain? No/denies    Pain Onset In the past 7 days                  Access Code: [email or web address] URL: [email or web address] Date: 06/01/2020 Prepared by: Sherlie Ban  Reviewed below exercises with pt and pt's caregiver (did not get to fully review at last session),  with both able to demonstrate understanding.   Exercises Seated Heel Toe Raises - 1 x daily - 5 x weekly - 1 sets - 10 reps Seated Knee Extension with Resistance - 1 x daily - 5 x weekly - 1 sets - 10 reps Seated Hamstring Curl with Anchored Resistance - 1 x daily - 5 x weekly - 1 sets - 10 reps Seated March with Resistance - 1 x daily - 5 x weekly - 1 sets - 10 reps Seated Hip Abduction with Resistance - 1 x daily - 5 x weekly - 1 sets - 10 reps Seated Hip Adduction Isometrics with Ball - 1 x daily - 5 x weekly - 1 sets - 10 reps - 5 hold Sit to Stand with Counter Support - 1 x daily - 5 x weekly - 1 sets - 5 reps             [facility] Adult PT Treatment/Exercise - 06/01/20 1527      Transfers   Transfers Sit to Stand;Stand to Sit    Sit to Stand 4: Min guard;With upper extremity assist;From chair/3-in-1    Sit to Stand Details Tactile cues for weight shifting;Verbal cues for sequencing;Verbal cues for technique;Verbal cues for safe use of DME/AE;Manual facilitation for weight shifting;Manual facilitation for placement  Sit to Stand Details (indicate cue type and reason) pt needing cues to scoot towards edge and for proper UE placement before performing, takes incr time    Stand to Sit 4: Min guard;With upper extremity assist;To chair/3-in-1    Comments prior to standing performed 3 reps sit <> stand having pt place BUE anteriorly on chair arm rests for incr forward weight shift (therapist sitting in chair), with pt standing performed pt reaching R/L with single UE support towards caregivers hand x5 reps for each stand for posture/weight shift and trunk elongation, then performed x5 reps BUE raises over head during last 2 stands, cues for tall posture and bringing belly button forward towards therapist, min A for balance       Ambulation/Gait   Ambulation/Gait Yes    Ambulation/Gait Assistance 4: Min assist    Ambulation/Gait Assistance Details facilitation at hips for weight shift  for incr step length B, during 1st bout pt demonstrating shuffled steps, after stepping activity, pt able to demo improved step length for a couple of steps and then reverting back to more shuffled steps. pt with incr forward flexed posture    Ambulation Distance (Feet) 20 Feet   x1, 15 x 1   Assistive device Rolling walker    Gait Pattern Step-to pattern;Decreased step length - right;Decreased step length - left;Decreased dorsiflexion - left;Decreased dorsiflexion - right;Right foot flat;Left foot flat;Poor foot clearance - left;Poor foot clearance - right;Shuffle;Step-through pattern;Decreased stance time - right;Decreased stride length    Ambulation Surface Level;Indoor    Pre-Gait Activities between bouts of gait: standing in RW performed stepping activity with pt stepping foot towards colorful floor discs x10 reps B, assist for facilitation for weight shift, verbal and demo cues for incr step length, for majority of reps pt able to step toe to disc vs. Whole foot                  PT Education - 06/01/20 1625    Education Details reviewed remainder of HEP with pt and caregiver    Person(s) Educated Patient;Caregiver(s)    Methods Explanation;Handout;Demonstration    Comprehension Verbalized understanding;Returned demonstration            PT Short Term Goals - 05/25/20 2100      PT SHORT TERM GOAL #1   Title ALL STGS = LTGS             PT Long Term Goals - 05/25/20 2111      PT LONG TERM GOAL #1   Title Pt will be independent with final HEP in order to build upon functional gains made in therapy. ALL LTGS DUE 06/22/20    Baseline 05/25/20 - did not have the chance to fully review    Time 4    Period Weeks    Status On-going    Target Date 06/22/20      PT LONG TERM GOAL #2   Title Pt will decrease TUG time to 3 minutes and 30 seconds or less with RW in order to demo decr fall risk.    Baseline 04/22/20: 5 minutes 59.25 sec's, increased from last assessment, 05/25/20: 3  MINUTES 57 seconds improved from 04/22/20    Time 4    Period Weeks    Status On-going      PT LONG TERM GOAL #3   Title Pt will ambulate at least 27' consecutively with RW with min guard in order to demo improved household mobility.    Baseline 38'  with RW with min guard/min A    Time 4    Period Weeks    Status Revised      PT LONG TERM GOAL #4   Title Pt will decr 5x sit <> stand time to 40 seconds or less from mat table with UE support in order to demo improved functional BLE strength.    Baseline 04/22/20: 51.56 sec's with UE support from standard height chair, improved from 53.79 sec's just not to goal    Time 4    Period Weeks    Status On-going                 Plan - 06/01/20 1734    Clinical Impression Statement Pt able to stand today without UE support - having therapist anteriorly to pt helping provide min A and BLE for balance. Cues for tall posture as pt tends to stand forwardly flexed. After stepping activity towards floor dics for incr foot clearance, pt able to demo improved step length B for a couple reps but then reverts back to more shuffled pattern. Will continue to progress towards LTGs.    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    Examination-Activity Limitations Bed Mobility;Bend;Locomotion Level;Stairs;Stand;Transfers;Squat    Examination-Participation Restrictions Community Activity    Stability/Clinical Decision Making Stable/Uncomplicated    Rehab Potential Good    PT Frequency 2x / week    PT Duration 4 weeks   3-4   PT Treatment/Interventions ADLs/Self Care Home Management;Gait training;DME Instruction;Stair training;Functional mobility training;Therapeutic activities;Neuromuscular re-education;Balance training;Therapeutic exercise;Patient/family education;Orthotic Fit/Training;Energy  conservation;Passive range of motion    PT Next Visit Plan BLE strengthening. standing balance with decr UE support, continue with nustep for stengthening/activity tolerance/working on increased speed with mobility, needs transfer/turning training with RW. stepping strategies to promote increased step length,    Consulted and Agree with Plan of Care Patient;Family member/caregiver    Family Member Consulted CNA [name]           Patient will benefit from skilled therapeutic intervention in order to improve the following deficits and impairments:  Abnormal gait, Decreased balance, Decreased activity tolerance, Decreased coordination, Decreased endurance, Decreased knowledge of use of DME, Decreased range of motion, Difficulty walking, Decreased strength, Postural dysfunction  Visit Diagnosis: Unsteadiness on feet  Hemiplegia and hemiparesis following cerebral infarction affecting left non-dominant side (HCC)  Other lack of coordination  Muscle weakness (generalized)  Other symptoms and signs involving the nervous system     Problem List Patient Active Problem List   Diagnosis Date Noted  . Controlled type 2 diabetes mellitus with hyperglycemia, without long-term current use of insulin (HCC) 04/06/2020  . Abnormality of gait 02/03/2020  . Benign essential HTN   . CKD (chronic kidney disease), stage II   . Hemiparesis affecting left side as late effect of stroke (HCC)   . Labile blood glucose   . Labile blood pressure   . Elevated BUN   . Hyponatremia   . Diabetic peripheral neuropathy (HCC)   . 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 Dyslipidemia   . Class 1 obesity due to excess calories with serious comorbidity and body mass index (BMI) of 30.0 to 30.9 in adult   . Left hemiparesis (HCC)   . Controlled type 2 diabetes mellitus with hyperglycemia (HCC)   . Morbid obesity (HCC)   . Leukocytosis   .  History of TIA (transient ischemic attack)   . Acute CVA  (cerebrovascular accident) (HCC) 01/06/2020  . Acute ischemic stroke (HCC) 01/05/2020  . Hypertensive emergency 01/26/2017  . TIA (transient ischemic attack) 01/26/2017  . Hypothyroidism 01/26/2017  . Aphasia   . Essential hypertension   . Diabetes mellitus without complication (HCC)     Drake Leachhloe N Jasin Brazel, PT, DPT  06/01/2020, 5:36 PM  [facility] [address] [place], [place], [address] Phone: [phone]   Fax:  [phone]  Name: Angeline SlimMolly L Braggs MRN: [id number] Date of Birth: 12/16/1940

## 2020-06-01 NOTE — Therapy (Signed)
[facility] [address] [place], [place], [address] Phone: [phone]   Fax:  [phone]  Occupational Therapy Treatment  Patient Details  Name: Jill Rose MRN: [id number] Date of Birth: 1940/12/02 No data recorded  Encounter Date: 06/01/2020   OT End of Session - 06/01/20 1455    Visit Number 13    Number of Visits 17    Date for OT Re-Evaluation 07/15/20    Authorization Type Aet[employer] MCR    Authorization - Visit Number 13    Authorization - Number of Visits 10    Progress Note Due on Visit 20    OT Start Time 1400    OT Stop Time 1445    OT Time Calculation (min) 45 min    Behavior During Therapy [facility] for tasks assessed/performed           Past Medical History:  Diagnosis Date  . Diabetes mellitus without complication ([place])   . Essential hypertension   . High cholesterol   . Hypothyroidism   . Stroke ([place])   . TIA (transient ischemic attack) 2018    History reviewed. No pertinent surgical history.  There were no vitals filed for this visit.   Subjective Assessment - 06/01/20 1412    Subjective  I saw Dr Posey Pronto yesterday - he said I was making progress    Pertinent History Rt MCA CVA 01/05/20. PMH: HTN, HLD, T2DM, Diabetic neuropathy    Limitations no driving, fall risk, HOH    Patient Stated Goals return to cooking    Currently in Pain? Yes    Pain Score 5     Pain Location Knee    Pain Orientation Right    Pain Descriptors / Indicators Sore    Pain Type Acute pain    Pain Onset In the past 7 days    Pain Frequency Constant    Aggravating Factors  climbing into SUV    Pain Relieving Factors Nothing                        OT Treatments/Exercises (OP) - 06/01/20 0001      Neurological Re-education Exercises   Other Exercises 1 Neuromuscular reeducation to address active controlled movement in Left UE.  Patient with the ability to reach overhead, but lacks control and full  range for higher reach thus it is diufficult for her to style her hair.  Worked in supine to address gross motor coordiantion for rolling.  Patient has difficulty coordianating body segments to roll to either right or left side.  Patient very fearful with new movement - but did well with repetition and encouragement.  Worked on rolling to left then accepting weight onto left forearm for sidesitting.  Patient required mod facilitationfor this.  In supine set up early HEP for BUE shoulder exercises.  Caregiver present and recorded session to enforce technique.                    OT Education - 06/01/20 1455    Education Details HEP for supine Bilateral exercise    Person(s) Educated Patient;Caregiver(s)    Methods Explanation    Comprehension Verbal cues required;Returned demonstration;Tactile cues required;Need further instruction            OT Short Term Goals - 06/01/20 1458      OT SHORT TERM GOAL #1   Title Independent with coordination HEP Lt hand    Time 4  Period Weeks    Status Partially Met   Issued coordination HEP 03/19/20  Patient cpmpletes with assitance from CNA 05/18/20     OT [place] #2   Title Pt to perform UE dressing with min assist or less and LE dressing with mod assist or less    Time 4    Period Weeks    Status Achieved   Pt reports doing approximately 50% of UB dressing 03/19/20     OT SHORT TERM GOAL #3   Title Pt to perform simple snack prep/cold meal prep standing level with DME and supervision prn    Time 4    Period Weeks    Status Deferred   Pt reports assisting with cutting vegetables with husband 03/19/20 - seated.     OT SHORT TERM GOAL #4   Title Pt to perform sustained high level reaching LUE for endurance    Time 4    Period Weeks    Status Achieved      OT SHORT TERM GOAL #5   Title Pt to return to putting up/styling hair mod I level    Time 4    Period Weeks    Status Partially Met   Pt reports needing assistance for clasps  and barettes            OT Long Term Goals - 06/01/20 1458      OT LONG TERM GOAL #1   Title Independent with strengthening HEP for LUE    Time 8    Period Weeks    Status On-going      OT LONG TERM GOAL #2   Title Improve coordination Lt hand as evidenced by performing 9 hole peg test in 41 sec or less    Baseline 48.59 sec    Time 8    Period Weeks    Status On-going      OT LONG TERM GOAL #3   Title Pt to be mod I level with all BADLS    Time 8    Period Weeks    Status On-going      OT LONG TERM GOAL #4   Title Pt to cook simple meal with DME prn and distant supervision    Time 8    Period Weeks    Status On-going      OT LONG TERM GOAL #5   Title Pt to perform light cleaning tasks (washing dishes, folding laundry, etc) mod I level    Time 8    Period Weeks    Status On-going                 Plan - 06/01/20 1456    Clinical Impression Statement Patient continues with significant sensory perceptual and cognitive deficits as well as neuromuscular impairments - however, she is developing trust in treatment team, and is seeing improvement with ADL/IADL.  Consistent caregiver is very helpful for carryover to home.    OT Occupational Profile and History Detailed Assessment- Review of Records and additional review of physical, cognitive, psychosocial history related to current functional performance    Occupational performance deficits (Please refer to evaluation for details): ADL's;IADL's    Body Structure / Function / Physical Skills ADL;ROM;Balance;Body mechanics;Mobility;Flexibility;Strength;Coordination;FMC;UE functional use;Decreased knowledge of use of DME    Rehab Potential Good    Clinical Decision Making Several treatment options, min-mod task modification necessary    Comorbidities Affecting Occupational Performance: May have comorbidities impacting occupational performance    Modification  or Assistance to Complete Evaluation  Min-Moderate modification  of tasks or assist with assess necessary to complete eval    OT Frequency 2x / week    OT Duration 8 weeks   Plus eval   OT Treatment/Interventions Self-care/ADL training;Therapeutic exercise;Functional Mobility Training;Neuromuscular education;Manual Therapy;Therapeutic activities;Coping strategies training;DME and/or AE instruction;Cognitive remediation/compensation;Moist Heat;Passive range of motion;Patient/family education    Plan continue wt shifts in standing (maybe in kitchen in prep for cooking), ADLS    Consulted and Agree with Plan of Care Patient;Family member/caregiver    Family Member Consulted caregiver CNA           Patient will benefit from skilled therapeutic intervention in order to improve the following deficits and impairments:   Body Structure / Function / Physical Skills: ADL, ROM, Balance, Body mechanics, Mobility, Flexibility, Strength, Coordination, FMC, UE functional use, Decreased knowledge of use of DME       Visit Diagnosis: Hemiplegia and hemiparesis following cerebral infarction affecting left non-dominant side (HCC)  Unsteadiness on feet  Other lack of coordination  Muscle weakness (generalized)  Other symptoms and signs involving the nervous system    Problem List Patient Active Problem List   Diagnosis Date Noted  . Controlled type 2 diabetes mellitus with hyperglycemia, without long-term current use of insulin ([place]) 04/06/2020  . Abnormality of gait 02/03/2020  . Benign essential HTN   . CKD (chronic kidney disease), stage II   . Hemiparesis affecting left side as late effect of stroke ([place])   . Labile blood glucose   . Labile blood pressure   . Elevated BUN   . Hyponatremia   . Diabetic peripheral neuropathy ([place])   . 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 Dyslipidemia   . Class 1 obesity due to excess calories with serious comorbidity and body mass index (BMI) of 30.0 to 30.9 in adult   . Left hemiparesis ([place])   .  Controlled type 2 diabetes mellitus with hyperglycemia ([place])   . Morbid obesity ([place])   . Leukocytosis   . History of TIA (transient ischemic attack)   . Acute CVA (cerebrovascular accident) ([place]) 01/06/2020  . Acute ischemic stroke ([place]) 01/05/2020  . Hypertensive emergency 01/26/2017  . TIA (transient ischemic attack) 01/26/2017  . Hypothyroidism 01/26/2017  . Aphasia   . Essential hypertension   . Diabetes mellitus without complication [facility])     Mariah Milling, OTR/L 06/01/2020, 2:58 PM  [facility] [address] [place], [place], [address] Phone: [phone]   Fax:  [phone]  Name: HEYDY MONTILLA MRN: [id number] Date of Birth: [birth year]

## 2020-06-01 NOTE — Therapy (Signed)
[facility] [address] [place], [place], [address] Phone: [phone]   Fax:  [phone]  Speech Language Pathology Treatment  Patient Details  Name: Jill Rose MRN: [id number] Date of Birth: December 17, 1940 Referring Provider (SLP): Jamse Arn, MD   Encounter Date: 06/01/2020   End of Session - 06/01/20 1652    Visit Number 15    Number of Visits 20    Date for SLP Re-Evaluation 07/13/20    SLP Start Time 1533    SLP Stop Time  1615    SLP Time Calculation (min) 42 min    Activity Tolerance Patient tolerated treatment well           Past Medical History:  Diagnosis Date  . Diabetes mellitus without complication ([place])   . Essential hypertension   . High cholesterol   . Hypothyroidism   . Stroke ([place])   . TIA (transient ischemic attack) 2018    No past surgical history on file.  There were no vitals filed for this visit.   Subjective Assessment - 06/01/20 1602    Subjective "I'm tired."    Patient is accompained by: --   Jill Rose   Currently in Pain? No/denies                 ADULT SLP TREATMENT - 06/01/20 1609      General Information   Behavior/Cognition Alert;Cooperative;Requires cueing;Distractible;Decreased sustained attention      Treatment Provided   Treatment provided Cognitive-Linquistic      Cognitive-Linquistic Treatment   Treatment focused on Cognition;Patient/family/caregiver education    Skilled Treatment SLP worked with Jill Rose today to ID different aspects of her memory notebook and look for certain components in her notebook (exercises obtained today, adn today's journal). SLP educated aid/modeled for aid how to work with pt to make daily tasks more therapeutic. Pt consistently req'd extra time to process verbal requests and req'd additional repeats occasionally as she did not always recall what the directions were. With consistent mod-max cues pt was able to find detailed  info in her notebook. (date of HEP, dates of journal).      Assessment / Recommendations / Plan   Plan Continue with current plan of care      Progression Toward Goals   Progression toward goals Progressing toward goals              SLP Short Term Goals - 05/14/20 1129      SLP SHORT TERM GOAL #1   Title pt will *attempt to access* her memory system for simple organization of discipline-specific items, or for temporal orientation x5 sessions    Time 1    Period Weeks    Status Not Met      SLP SHORT TERM GOAL #2   Title pt will demo knowledge of items in her memory system/notebook by correctly accessing them in 2 sessions    Time 1    Period Weeks    Status Not Met      SLP SHORT TERM GOAL #3   Title pt will demonstrate error awareness in therapy tasks 75% with min cues for re-check in 3 sessions    Time 1    Period Weeks    Status Not Met      SLP SHORT TERM GOAL #4   Title pt will name 8 items for simple categories x 3 sessions    Time 1    Period Weeks  Status Not Met            SLP Long Term Goals - 06/01/20 1710      SLP LONG TERM GOAL #1   Title pt will demo correct usage of a memory compensation system for appointment and/or logging blood pressure and blood sugar, daily schedules in 3 sessions    Time 3    Period Weeks    Status On-going      SLP LONG TERM GOAL #2   Title Pt will maintain selective attention in functional task for 8 minutes in mod noisy environment with rare min cues x 2 sessions.    Time 3    Period Weeks    Status On-going      SLP LONG TERM GOAL #3   Title pt will demo anticipatory awareness regarding deficit areas, independently, in 3 sessions    Time 3    Period Weeks    Status On-going      SLP LONG TERM GOAL #4   Title pt will use compensations for anomia in 8 minutes simple conversation x3 visits    Time 3    Period Weeks    Status On-going            Plan - 06/01/20 1652    Clinical Impression Statement [name] presents with overall moderate cognitive communication deficits. Suspect attention, primarily, and slow processing contributes to this difficulty. Today pt held attention for up to 1 minute in conversation about an emotional topic. Attention was less for therapy tasks and req'd SLP cues back to task. SLP cont to model for Jill Rose how to best work with pt at home to make daily events more therapeutic. Based on what SLP saw SLP renewed pt for 8 more sessions - however if progress does not continue to be shown during this time, pt will likely be put on hold or discharged. Prior to her CVA pt enjoyed cooking and reading about natural medicine/ ayurveda. I recommend skilled ST to address cognitive and language deficits for better communication with family and friends, to increase independence and improve quality of life.    Speech Therapy Frequency 2x / week    Duration --   5 weeks or 20 total visits   Treatment/Interventions SLP instruction and feedback;Functional tasks;Cognitive reorganization;Compensatory strategies;Internal/external aids;Multimodal communcation approach;Patient/family education;Environmental controls;Language facilitation;Compensatory techniques    Potential to Achieve Goals Good    Consulted and Agree with Plan of Care Patient           Patient will benefit from skilled therapeutic intervention in order to improve the following deficits and impairments:   Cognitive communication deficit  Aphasia    Problem List Patient Active Problem List   Diagnosis Date Noted  . Controlled type 2 diabetes mellitus with hyperglycemia, without long-term current use of insulin ([place]) 04/06/2020  . Abnormality of gait 02/03/2020  . Benign essential HTN   . CKD (chronic kidney disease), stage II   . Hemiparesis affecting left side as late effect of stroke ([place])   . Labile blood glucose   . Labile blood pressure   . Elevated BUN   . Hyponatremia   . Diabetic peripheral neuropathy ([place])    . 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 Dyslipidemia   . Class 1 obesity due to excess calories with serious comorbidity and body mass index (BMI) of 30.0 to 30.9 in adult   . Left hemiparesis ([place])   . Controlled type 2 diabetes mellitus  with hyperglycemia ([place])   . Morbid obesity ([place])   . Leukocytosis   . History of TIA (transient ischemic attack)   . Acute CVA (cerebrovascular accident) ([place]) 01/06/2020  . Acute ischemic stroke ([place]) 01/05/2020  . Hypertensive emergency 01/26/2017  . TIA (transient ischemic attack) 01/26/2017  . Hypothyroidism 01/26/2017  . Aphasia   . Essential hypertension   . Diabetes mellitus without complication ([place])     [place], [place]  06/01/2020, 5:11 PM  [facility] [address] [place], [place], [address] Phone: [phone]   Fax:  [phone]   Name: Jill Rose MRN: [id number] Date of Birth: Dec 31, 1940

## 2020-06-03 ENCOUNTER — Encounter: Payer: Self-pay | Admitting: Physical Therapy

## 2020-06-03 ENCOUNTER — Ambulatory Visit: Payer: Medicare HMO | Admitting: Physical Therapy

## 2020-06-03 ENCOUNTER — Ambulatory Visit: Payer: Medicare HMO | Admitting: Occupational Therapy

## 2020-06-03 ENCOUNTER — Other Ambulatory Visit: Payer: Self-pay

## 2020-06-03 ENCOUNTER — Encounter: Payer: Self-pay | Admitting: Occupational Therapy

## 2020-06-03 DIAGNOSIS — I69354 Hemiplegia and hemiparesis following cerebral infarction affecting left non-dominant side: Secondary | ICD-10-CM

## 2020-06-03 DIAGNOSIS — R2681 Unsteadiness on feet: Secondary | ICD-10-CM

## 2020-06-03 DIAGNOSIS — R2689 Other abnormalities of gait and mobility: Secondary | ICD-10-CM

## 2020-06-03 DIAGNOSIS — M6281 Muscle weakness (generalized): Secondary | ICD-10-CM

## 2020-06-03 NOTE — Therapy (Signed)
[facility] [address] [place], [place], [address] Phone: [phone]   Fax:  [phone]  Occupational Therapy Treatment  Patient Details  Name: Jill Rose MRN: [id number] Date of Birth: 1941-05-17 No data recorded  Encounter Date: 06/03/2020   OT End of Session - 06/03/20 1404    Visit Number 14    Number of Visits 17    Date for OT Re-Evaluation 07/15/20    Authorization Type Aetna MCR    Authorization - Visit Number 14    Authorization - Number of Visits 11    Progress Note Due on Visit 20    OT Start Time 1315    OT Stop Time 1400    OT Time Calculation (min) 45 min    Activity Tolerance Patient tolerated treatment well    Behavior During Therapy [facility] for tasks assessed/performed           Past Medical History:  Diagnosis Date  . Diabetes mellitus without complication ([place])   . Essential hypertension   . High cholesterol   . Hypothyroidism   . Stroke ([place])   . TIA (transient ischemic attack) 2018    No past surgical history on file.  There were no vitals filed for this visit.   Subjective Assessment - 06/03/20 1314    Subjective  I saw Dr Posey Pronto yesterday - he said I was making progress    Pertinent History Rt MCA CVA 01/05/20. PMH: HTN, HLD, T2DM, Diabetic neuropathy    Limitations no driving, fall risk, HOH    Patient Stated Goals return to cooking    Currently in Pain? No/denies    Pain Onset In the past 7 days           Standing at countertop - working on wt shifts while tapping visual targets on floor. Pt appeared to shift weight better with visual targets. Side stepping along counter to Rt and Lt side w/ visual targets, pt required cues to keep steps big especially to weaker Lt side. Worked on sit to stand and sufficient forward lean in prep for standing as pt leans back more onto heels.   Pt practiced donning/doffing jacket w/ min assist. Pt standing to get pants off hips to knees and  back over hips w/ CGA for safety/fall prevention. Pt did well assisting with Lt hand.  Pt then practiced doffing shoes and socks w/ use of stool and cues to get socks off of heels (longer socks more difficult). Pt able to don longer socks bilaterally, and donned shoes w/ use of shoe horn and assist to get shoe horn to back of shoe.                      OT Education - 06/03/20 1403    Education Details Hemi dressing techniques for greater carryover/consistency at home (also instructed to use stretchy shirts w/ big neck opening and go up a size w/ jackets/coats w/ slicker lining preferred)    Person(s) Educated Patient;Caregiver(s)    Methods Explanation;Handout    Comprehension Verbalized understanding;Returned demonstration            OT Short Term Goals - 06/01/20 1458      OT SHORT TERM GOAL #1   Title Independent with coordination HEP Lt hand    Time 4    Period Weeks    Status Partially Met   Issued coordination HEP 03/19/20  Patient cpmpletes with assitance from CNA 05/18/20  OT SHORT TERM GOAL #2   Title Pt to perform UE dressing with min assist or less and LE dressing with mod assist or less    Time 4    Period Weeks    Status Achieved   Pt reports doing approximately 50% of UB dressing 03/19/20     OT SHORT TERM GOAL #3   Title Pt to perform simple snack prep/cold meal prep standing level with DME and supervision prn    Time 4    Period Weeks    Status Deferred   Pt reports assisting with cutting vegetables with husband 03/19/20 - seated.     OT SHORT TERM GOAL #4   Title Pt to perform sustained high level reaching LUE for endurance    Time 4    Period Weeks    Status Achieved      OT SHORT TERM GOAL #5   Title Pt to return to putting up/styling hair mod I level    Time 4    Period Weeks    Status Partially Met   Pt reports needing assistance for clasps and barettes            OT Long Term Goals - 06/01/20 1458      OT LONG TERM GOAL #1    Title Independent with strengthening HEP for LUE    Time 8    Period Weeks    Status On-going      OT LONG TERM GOAL #2   Title Improve coordination Lt hand as evidenced by performing 9 hole peg test in 41 sec or less    Baseline 48.59 sec    Time 8    Period Weeks    Status On-going      OT LONG TERM GOAL #3   Title Pt to be mod I level with all BADLS    Time 8    Period Weeks    Status On-going      OT LONG TERM GOAL #4   Title Pt to cook simple meal with DME prn and distant supervision    Time 8    Period Weeks    Status On-going      OT LONG TERM GOAL #5   Title Pt to perform light cleaning tasks (washing dishes, folding laundry, etc) mod I level    Time 8    Period Weeks    Status On-going                 Plan - 06/03/20 1405    Clinical Impression Statement Pt did better with weight shifts today using visual cues/targets. Pt does well with dressing using hemi dressing techniques but needs consistency at home.    OT Occupational Profile and History Detailed Assessment- Review of Records and additional review of physical, cognitive, psychosocial history related to current functional performance    Occupational performance deficits (Please refer to evaluation for details): ADL's;IADL's    Body Structure / Function / Physical Skills ADL;ROM;Balance;Body mechanics;Mobility;Flexibility;Strength;Coordination;FMC;UE functional use;Decreased knowledge of use of DME    Rehab Potential Good    Clinical Decision Making Several treatment options, min-mod task modification necessary    Comorbidities Affecting Occupational Performance: May have comorbidities impacting occupational performance    Modification or Assistance to Complete Evaluation  Min-Moderate modification of tasks or assist with assess necessary to complete eval    OT Frequency 2x / week    OT Duration 8 weeks   Plus eval   OT Treatment/Interventions  Self-care/ADL training;Therapeutic exercise;Functional  Mobility Training;Neuromuscular education;Manual Therapy;Therapeutic activities;Coping strategies training;DME and/or AE instruction;Cognitive remediation/compensation;Moist Heat;Passive range of motion;Patient/family education    Plan continue wt shifts in standing (maybe in kitchen in prep for cooking), continue to reinforce ADLS, pt only scheduled 2 more sessions - primary therapist to determine need for more visits?     Consulted and Agree with Plan of Care Patient;Family member/caregiver    Family Member Consulted caregiver CNA           Patient will benefit from skilled therapeutic intervention in order to improve the following deficits and impairments:   Body Structure / Function / Physical Skills: ADL, ROM, Balance, Body mechanics, Mobility, Flexibility, Strength, Coordination, FMC, UE functional use, Decreased knowledge of use of DME       Visit Diagnosis: Hemiplegia and hemiparesis following cerebral infarction affecting left non-dominant side (HCC)  Unsteadiness on feet    Problem List Patient Active Problem List   Diagnosis Date Noted  . Controlled type 2 diabetes mellitus with hyperglycemia, without long-term current use of insulin ([place]) 04/06/2020  . Abnormality of gait 02/03/2020  . Benign essential HTN   . CKD (chronic kidney disease), stage II   . Hemiparesis affecting left side as late effect of stroke ([place])   . Labile blood glucose   . Labile blood pressure   . Elevated BUN   . Hyponatremia   . Diabetic peripheral neuropathy ([place])   . 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 Dyslipidemia   . Class 1 obesity due to excess calories with serious comorbidity and body mass index (BMI) of 30.0 to 30.9 in adult   . Left hemiparesis ([place])   . Controlled type 2 diabetes mellitus with hyperglycemia ([place])   . Morbid obesity ([place])   . Leukocytosis   . History of TIA (transient ischemic attack)   . Acute CVA (cerebrovascular accident) ([place]) 01/06/2020    . Acute ischemic stroke ([place]) 01/05/2020  . Hypertensive emergency 01/26/2017  . TIA (transient ischemic attack) 01/26/2017  . Hypothyroidism 01/26/2017  . Aphasia   . Essential hypertension   . Diabetes mellitus without complication ([place])     Carey Bullocks, OTR/L 06/03/2020, 2:08 PM  [facility] [address] [place], [place], [address] Phone: [phone]   Fax:  [phone]  Name: Jill Rose MRN: [id number] Date of Birth: 08-19-1940

## 2020-06-03 NOTE — Patient Instructions (Signed)
   DRESSING TECHNIQUES:   1) Always put weaker arm in shirt sleeve first, then slide above elbow, then put in stronger arm and slide sleeve above elbow, then pull over head. To remove shirt, gather in back and then pull over, then take off strong arm, then weak arm last.   2) Always put weaker foot in pants leg first, then stronger side, then pull up to thighs. Get help to stand for balance while you pull over hips.    3) Use stool for donning/doffing shoes and socks. Get socks off heels first, then remove by pulling off toes. Get socks on by getting over big and small toe fully before pulling up.   4) Use shoe horn to put shoes on - only put toes in shoes, then place shoe horn in shoe and slide to the back around heel, then push foot in shoe.

## 2020-06-04 NOTE — Therapy (Signed)
[facility] [address] [place], [place], [address] Phone: [phone]   Fax:  [phone]  Physical Therapy Treatment  Patient Details  Name: Jill Rose MRN: [id number] Date of Birth: 1941-02-01 Referring Provider (PT): Clide Dales, [place] (being followed by Dr. Allena Katz)   Encounter Date: 06/03/2020   PT End of Session - 06/03/20 1410    Visit Number 18    Number of Visits 24    Date for PT Re-Evaluation 06/25/20   written for additional 4 week POC   Authorization Type [employer] Medicare- $35 co pay per day regardless of discipline    PT Start Time 1405    PT Stop Time 1445    PT Time Calculation (min) 40 min    Equipment Utilized During Treatment Gait belt    Activity Tolerance Patient tolerated treatment well;Patient limited by fatigue    Behavior During Therapy [facility] for tasks assessed/performed           Past Medical History:  Diagnosis Date  . Diabetes mellitus without complication (HCC)   . Essential hypertension   . High cholesterol   . Hypothyroidism   . Stroke (HCC)   . TIA (transient ischemic attack) 2018    History reviewed. No pertinent surgical history.  There were no vitals filed for this visit.   Subjective Assessment - 06/03/20 1407    Subjective No new complaints. Still with knee pain. No falls.    Patient is accompained by: Family member   caregiver   Pertinent History diabetes mellitus, HTN, hypothyroidism, TIA (2018)    Patient Stated Goals wants to be walking more.    Currently in Pain? Yes    Pain Score 5     Pain Location Knee    Pain Orientation Right    Pain Descriptors / Indicators Sore;Aching    Pain Type Acute pain    Pain Onset More than a month ago    Pain Frequency Constant    Aggravating Factors  increased activity    Pain Relieving Factors nothing so far                [facility] Adult PT Treatment/Exercise - 06/03/20 1411      Transfers   Transfers Sit to  Stand;Stand to Sit    Sit to Stand 4: Min guard;With upper extremity assist;From chair/3-in-1    Sit to Stand Details Tactile cues for weight shifting;Verbal cues for sequencing;Verbal cues for technique;Verbal cues for safe use of DME/AE;Manual facilitation for weight shifting;Manual facilitation for placement    Sit to Stand Details (indicate cue type and reason) cues to scoot to edge of the chair, hand placement and for anterior weight shifting.     Stand to Sit 4: Min guard;With upper extremity assist;To chair/3-in-1    Stand to Sit Details (indicate cue type and reason) Tactile cues for weight shifting;Tactile cues for sequencing;Verbal cues for sequencing;Verbal cues for technique;Verbal cues for safe use of DME/AE;Manual facilitation for weight shifting    Stand to Sit Details cues to reach back for controlled descent      Ambulation/Gait   Ambulation/Gait Yes    Ambulation/Gait Assistance 4: Min assist    Ambulation/Gait Assistance Details cues/facilitation at pelvis for weight shifting to allow for increased step length    Ambulation Distance (Feet) 25 Feet   xx, 30 x1   Assistive device Rolling walker    Gait Pattern Step-to pattern;Decreased step length - right;Decreased step length - left;Decreased dorsiflexion -  left;Decreased dorsiflexion - right;Right foot flat;Left foot flat;Poor foot clearance - left;Poor foot clearance - right;Shuffle;Step-through pattern;Decreased stance time - right;Decreased stride length    Ambulation Surface Level;Indoor      Neuro Re-ed    Neuro Re-ed Details  for balance/strengthening/muscle re-ed: standing at [facility] for the following tasks to promote increased step length/step height- fwd/bwd stepping over yard stick to color cirlces with cues for increased step length/height to clear the yard stick. cues/assitance needed for posture and weight shifting.  then worked on alternating foot taps to 4 inch box for ~10 reps each side, min assist with cues/assist for  posture and weight shifting.                      PT Short Term Goals - 05/25/20 2100      PT SHORT TERM GOAL #1   Title ALL STGS = LTGS             PT Long Term Goals - 05/25/20 2111      PT LONG TERM GOAL #1   Title Pt will be independent with final HEP in order to build upon functional gains made in therapy. ALL LTGS DUE 06/22/20    Baseline 05/25/20 - did not have the chance to fully review    Time 4    Period Weeks    Status On-going    Target Date 06/22/20      PT LONG TERM GOAL #2   Title Pt will decrease TUG time to 3 minutes and 30 seconds or less with RW in order to demo decr fall risk.    Baseline 04/22/20: 5 minutes 59.25 sec's, increased from last assessment, 05/25/20: 3 MINUTES 57 seconds improved from 04/22/20    Time 4    Period Weeks    Status On-going      PT LONG TERM GOAL #3   Title Pt will ambulate at least 10' consecutively with RW with min guard in order to demo improved household mobility.    Baseline 18' with RW with min guard/min A    Time 4    Period Weeks    Status Revised      PT LONG TERM GOAL #4   Title Pt will decr 5x sit <> stand time to 40 seconds or less from mat table with UE support in order to demo improved functional BLE strength.    Baseline 04/22/20: 51.56 sec's with UE support from standard height chair, improved from 53.79 sec's just not to goal    Time 4    Period Weeks    Status On-going             Plan - 06/03/20 1410    Clinical Impression Statement Today's skilled session continued to focus on pre-gait activities for increased step length/height and gait training with RW. The pt continues to needed max cues with assist/faciliation with mobility. The pt was able to increase her gait distance this session. The pt is progressing toward goals and should benefit from continued PT to progress toward unmet goals.    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    Examination-Activity Limitations Bed Mobility;Bend;Locomotion Level;Stairs;Stand;Transfers;Squat    Examination-Participation Restrictions Community Activity    Stability/Clinical Decision Making Stable/Uncomplicated    Rehab Potential Good    PT Frequency 2x / week    PT  Duration 4 weeks   3-4   PT Treatment/Interventions ADLs/Self Care Home Management;Gait training;DME Instruction;Stair training;Functional mobility training;Therapeutic activities;Neuromuscular re-education;Balance training;Therapeutic exercise;Patient/family education;Orthotic Fit/Training;Energy conservation;Passive range of motion    PT Next Visit Plan BLE strengthening. standing balance with decr UE support, continue with nustep for stengthening/activity tolerance/working on increased speed with mobility, needs transfer/turning training with RW. stepping strategies to promote increased step length,    Consulted and Agree with Plan of Care Patient;Family member/caregiver    Family Member Consulted CNA [name]                 Patient will benefit from skilled therapeutic intervention in order to improve the following deficits and impairments:  Abnormal gait, Decreased balance, Decreased activity tolerance, Decreased coordination, Decreased endurance, Decreased knowledge of use of DME, Decreased range of motion, Difficulty walking, Decreased strength, Postural dysfunction  Visit Diagnosis: Hemiplegia and hemiparesis following cerebral infarction affecting left non-dominant side (HCC)  Unsteadiness on feet  Muscle weakness (generalized)  Other abnormalities of gait and mobility     Problem List Patient Active Problem List   Diagnosis Date Noted  . Controlled type 2 diabetes mellitus with hyperglycemia, without long-term current use of insulin (HCC) 04/06/2020  . Abnormality of gait 02/03/2020  . Benign essential  HTN   . CKD (chronic kidney disease), stage II   . Hemiparesis affecting left side as late effect of stroke (HCC)   . Labile blood glucose   . Labile blood pressure   . Elevated BUN   . Hyponatremia   . Diabetic peripheral neuropathy (HCC)   . 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 Dyslipidemia   . Class 1 obesity due to excess calories with serious comorbidity and body mass index (BMI) of 30.0 to 30.9 in adult   . Left hemiparesis (HCC)   . Controlled type 2 diabetes mellitus with hyperglycemia (HCC)   . Morbid obesity (HCC)   . Leukocytosis   . History of TIA (transient ischemic attack)   . Acute CVA (cerebrovascular accident) (HCC) 01/06/2020  . Acute ischemic stroke (HCC) 01/05/2020  . Hypertensive emergency 01/26/2017  . TIA (transient ischemic attack) 01/26/2017  . Hypothyroidism 01/26/2017  . Aphasia   . Essential hypertension   . Diabetes mellitus without complication (HCC)     Sallyanne Kuster, PTA, [facility] [address], [address] [place], [place] [address] [phone] 06/04/20, 8:40 PM   Name: JOANANN MIES MRN: [id number] Date of Birth: 1941-02-17

## 2020-06-08 ENCOUNTER — Encounter: Payer: Self-pay | Admitting: Occupational Therapy

## 2020-06-08 ENCOUNTER — Ambulatory Visit: Payer: Medicare HMO | Admitting: Physical Therapy

## 2020-06-08 ENCOUNTER — Encounter: Payer: Self-pay | Admitting: Physical Therapy

## 2020-06-08 ENCOUNTER — Other Ambulatory Visit: Payer: Self-pay

## 2020-06-08 ENCOUNTER — Ambulatory Visit: Payer: Medicare HMO | Admitting: Occupational Therapy

## 2020-06-08 DIAGNOSIS — R2681 Unsteadiness on feet: Secondary | ICD-10-CM

## 2020-06-08 DIAGNOSIS — R2689 Other abnormalities of gait and mobility: Secondary | ICD-10-CM

## 2020-06-08 DIAGNOSIS — I69354 Hemiplegia and hemiparesis following cerebral infarction affecting left non-dominant side: Secondary | ICD-10-CM | POA: Diagnosis not present

## 2020-06-08 DIAGNOSIS — M6281 Muscle weakness (generalized): Secondary | ICD-10-CM

## 2020-06-08 DIAGNOSIS — R29818 Other symptoms and signs involving the nervous system: Secondary | ICD-10-CM

## 2020-06-08 DIAGNOSIS — R278 Other lack of coordination: Secondary | ICD-10-CM

## 2020-06-08 NOTE — Therapy (Signed)
[facility] [address] [place], [place], [address] Phone: [phone]   Fax:  [phone]  Occupational Therapy Treatment  Patient Details  Name: Jill Rose MRN: [id number] Date of Birth: 1941-05-24 No data recorded  Encounter Date: 06/08/2020   OT End of Session - 06/08/20 1338    Visit Number 15    Number of Visits 30    Date for OT Re-Evaluation 07/15/20    Authorization Type Aetna MCR    Authorization - Visit Number 15    Progress Note Due on Visit 20    OT Start Time 1231    OT Stop Time 1315    OT Time Calculation (min) 44 min    Activity Tolerance Patient tolerated treatment well    Behavior During Therapy [facility] for tasks assessed/performed           Past Medical History:  Diagnosis Date  . Diabetes mellitus without complication ([place])   . Essential hypertension   . High cholesterol   . Hypothyroidism   . Stroke ([place])   . TIA (transient ischemic attack) 2018    History reviewed. No pertinent surgical history.  There were no vitals filed for this visit.   Subjective Assessment - 06/08/20 1244    Subjective  Patient indicates no falls recently, and no pian today.  Patient excited that son in town for [facility]    Pertinent History Rt MCA CVA 01/05/20. PMH: HTN, HLD, T2DM, Diabetic neuropathy    Limitations no driving, fall risk, HOH    Patient Stated Goals return to cooking    Currently in Pain? No/denies    Pain Score 0-No pain                        OT Treatments/Exercises (OP) - 06/08/20 0001      ADLs   UB Dressing Had patient stand at table to doff jacket.  Patient able to stand unsupported and use UE's effctively for this task.      Functional Mobility Worked on dynamic standing balance and functional transiton fron sit to stand and stand to sit.  Worked on decreased UE reliance in standing, and functional use of left UE in stnading.        Neurological Re-education  Exercises   Other Exercises 1 Neuromuscular reeducation to address functional reach toward high reach.  UE supported on pole to promote arc of natural reach pattern.  Worked to encourage weight shift left, and forward with forward shoulder flexion, and scaption.                    OT Education - 06/08/20 1333    Education Details Educated patient and caregiver on leaning forward for sit to/from stand.  Alos discussed benefit of pole for support of UE for reach patterns.    Person(s) Educated Patient;Caregiver(s)    Methods Explanation;Demonstration    Comprehension Verbalized understanding;Need further instruction;Returned demonstration            OT Short Term Goals - 06/08/20 1337      OT SHORT TERM GOAL #1   Title Independent with coordination HEP Lt hand    Time 4    Period Weeks    Status Partially Met   Issued coordination HEP 03/19/20  Patient cpmpletes with assitance from CNA 05/18/20     OT [place] #2   Title Pt to perform UE dressing with min assist or less  and LE dressing with mod assist or less    Time 4    Period Weeks    Status Achieved   Pt reports doing approximately 50% of UB dressing 03/19/20     OT SHORT TERM GOAL #3   Title Pt to perform simple snack prep/cold meal prep standing level with DME and supervision prn    Time 4    Period Weeks    Status Deferred   Pt reports assisting with cutting vegetables with husband 03/19/20 - seated.     OT SHORT TERM GOAL #4   Title Pt to perform sustained high level reaching LUE for endurance    Time 4    Period Weeks    Status Achieved      OT SHORT TERM GOAL #5   Title Pt to return to putting up/styling hair mod I level    Time 4    Period Weeks    Status Partially Met   Pt reports needing assistance for clasps and barettes            OT Long Term Goals - 06/08/20 1337      OT LONG TERM GOAL #1   Title Independent with strengthening HEP for LUE    Time 8    Period Weeks    Status On-going       OT LONG TERM GOAL #2   Title Improve coordination Lt hand as evidenced by performing 9 hole peg test in 41 sec or less    Baseline 48.59 sec    Time 8    Period Weeks    Status On-going      OT LONG TERM GOAL #3   Title Pt to be mod I level with all BADLS    Time 8    Period Weeks    Status On-going      OT LONG TERM GOAL #4   Title Pt to cook simple meal with DME prn and distant supervision    Time 8    Period Weeks    Status On-going      OT LONG TERM GOAL #5   Title Pt to perform light cleaning tasks (washing dishes, folding laundry, etc) mod I level    Time 8    Period Weeks    Status On-going                 Plan - 06/08/20 1335    Clinical Impression Statement Patient continues to show improvement with upright posturein sitting and standing.  Patient with improved weight shifting to left and forward in sitting and standing - which will help functional mobility and ADL's at home.    OT Occupational Profile and History Detailed Assessment- Review of Records and additional review of physical, cognitive, psychosocial history related to current functional performance    Occupational performance deficits (Please refer to evaluation for details): ADL's;IADL's    Body Structure / Function / Physical Skills ADL;ROM;Balance;Body mechanics;Mobility;Flexibility;Strength;Coordination;FMC;UE functional use;Decreased knowledge of use of DME    Rehab Potential Good    Clinical Decision Making Several treatment options, min-mod task modification necessary    Comorbidities Affecting Occupational Performance: May have comorbidities impacting occupational performance    Modification or Assistance to Complete Evaluation  Min-Moderate modification of tasks or assist with assess necessary to complete eval    OT Frequency 2x / week    OT Duration 8 weeks   Plus eval   OT Treatment/Interventions Self-care/ADL training;Therapeutic exercise;Functional Mobility Training;Neuromuscular  education;Manual  Therapy;Therapeutic activities;Coping strategies training;DME and/or AE instruction;Cognitive remediation/compensation;Moist Heat;Passive range of motion;Patient/family education    Plan continue wt shifts in standing (maybe in kitchen in prep for cooking), mid to high level reach LUE    Consulted and Agree with Plan of Care Patient;Family member/caregiver    Family Member Consulted caregiver CNA           Patient will benefit from skilled therapeutic intervention in order to improve the following deficits and impairments:   Body Structure / Function / Physical Skills: ADL, ROM, Balance, Body mechanics, Mobility, Flexibility, Strength, Coordination, FMC, UE functional use, Decreased knowledge of use of DME       Visit Diagnosis: Other lack of coordination  Hemiplegia and hemiparesis following cerebral infarction affecting left non-dominant side (HCC)  Unsteadiness on feet  Muscle weakness (generalized)  Other symptoms and signs involving the nervous system    Problem List Patient Active Problem List   Diagnosis Date Noted  . Controlled type 2 diabetes mellitus with hyperglycemia, without long-term current use of insulin ([place]) 04/06/2020  . Abnormality of gait 02/03/2020  . Benign essential HTN   . CKD (chronic kidney disease), stage II   . Hemiparesis affecting left side as late effect of stroke ([place])   . Labile blood glucose   . Labile blood pressure   . Elevated BUN   . Hyponatremia   . Diabetic peripheral neuropathy ([place])   . Right middle cerebral artery stroke ([place]) 01/14/2020  . Dyslipidemia   . Class 1 obesity due to excess calories with serious comorbidity and body mass index (BMI) of 30.0 to 30.9 in adult   . Left hemiparesis ([place])   . Controlled type 2 diabetes mellitus with hyperglycemia ([place])   . Morbid obesity ([place])   . Leukocytosis   . History of TIA (transient ischemic attack)   . Acute CVA (cerebrovascular accident) ([place]) 01/06/2020  .  Acute ischemic stroke ([place]) 01/05/2020  . Hypertensive emergency 01/26/2017  . TIA (transient ischemic attack) 01/26/2017  . Hypothyroidism 01/26/2017  . Aphasia   . Essential hypertension   . Diabetes mellitus without complication [facility])     Mariah Milling, OTR/L 06/08/2020, 1:39 PM  [facility] [address] [place], [place], [address] Phone: [phone]   Fax:  [phone]  Name: EIMY PLAZA MRN: [id number] Date of Birth: 1941/03/27

## 2020-06-08 NOTE — Therapy (Signed)
[facility] [address] [place], [place], [address] Phone: [phone]   Fax:  [phone]  Physical Therapy Treatment  Patient Details  Name: Jill Rose MRN: [id number] Date of Birth: [birth year] Referring Provider (PT): Clide Dales, [place] (being followed by Dr. Allena Katz)   Encounter Date: 06/08/2020   PT End of Session - 06/08/20 1423    Visit Number 19    Number of Visits 24    Date for PT Re-Evaluation 06/25/20   written for additional 4 week POC   Authorization Type [employer] Medicare- $35 co pay per day regardless of discipline    PT Start Time 1317    PT Stop Time 1400    PT Time Calculation (min) 43 min    Equipment Utilized During Treatment Gait belt    Activity Tolerance Patient tolerated treatment well;Patient limited by fatigue    Behavior During Therapy [facility] for tasks assessed/performed           Past Medical History:  Diagnosis Date  . Diabetes mellitus without complication (HCC)   . Essential hypertension   . High cholesterol   . Hypothyroidism   . Stroke (HCC)   . TIA (transient ischemic attack) 2018    History reviewed. No pertinent surgical history.  There were no vitals filed for this visit.   Subjective Assessment - 06/08/20 1320    Subjective No falls - got scheduled out until December.    Patient is accompained by: Family member   caregiver   Pertinent History diabetes mellitus, HTN, hypothyroidism, TIA (2018)    Patient Stated Goals wants to be walking more.    Currently in Pain? No/denies    Pain Onset More than a month ago                             [facility] Adult PT Treatment/Exercise - 06/08/20 0001      Transfers   Transfers Sit to Stand;Stand to Sit    Sit to Stand 4: Min guard;With upper extremity assist;From chair/3-in-1    Sit to Stand Details Tactile cues for weight shifting;Verbal cues for sequencing;Verbal cues for technique;Verbal cues for safe  use of DME/AE;Manual facilitation for weight shifting;Manual facilitation for placement    Sit to Stand Details (indicate cue type and reason) cues for anterior weight shifting, scooting towards edge and proper UE placement on mat table/RW - performed x5 reps    Stand to Sit 4: Min guard;With upper extremity assist;To chair/3-in-1    Stand to Sit Details (indicate cue type and reason) Tactile cues for weight shifting;Tactile cues for sequencing;Verbal cues for sequencing;Verbal cues for technique;Verbal cues for safe use of DME/AE;Manual facilitation for weight shifting    Comments with therapist anteriorly in chair, had pt perform x7 reps sit <> stands with pt pushing with BUE from mat, verbal and tactile cues for pt to lean all the way forward towards therapist for incr forward weight shift, then in standing cues for standing up tall with pt needing intermittent UE support and pt with tendency to demonstrate incr forward flexed posture      Ambulation/Gait   Ambulation/Gait Yes    Ambulation/Gait Assistance 4: Min assist    Ambulation/Gait Assistance Details manual facilitation at pelvis for incr weight shifting as well as cues for incr step length B, performed at end of session with pt more fatigued and pt demonstrating incr shuffled steps    Ambulation  Distance (Feet) 25 Feet   x1   Assistive device Rolling walker    Gait Pattern Step-to pattern;Decreased step length - right;Decreased step length - left;Decreased dorsiflexion - left;Decreased dorsiflexion - right;Right foot flat;Left foot flat;Poor foot clearance - left;Poor foot clearance - right;Shuffle;Step-through pattern;Decreased stance time - right;Decreased stride length    Ambulation Surface Level;Indoor      Therapeutic Activites    Therapeutic Activities Other Therapeutic Activities    Other Therapeutic Activities Bed Mobility: (to stretch hip flexors) pt needing mod A with verbal and demo cues for sit > supine, with cues to come down  to L sidelying first and therapist needing to assist with bringing BLE onto mat table, once on mat table needing verbal/manual cues for bridging to perform scooting, performed multiple reps throughout with therapist using min A to help through pt's trunk, then pt cued to perform supine > sit with cues for rolling towards L side with min A for pt to remain in hooklying position and pt taking incr time. From L sidelying, pt needing max verbal and tactile cues on coming up to sitting but ultimately needing mod A      Exercises   Exercises Other Exercises    Other Exercises  standing at RW: alternating foot taps to 4" step x10 reps B. with pt supine on mat table, performed modified Pieroni test position for L hip flexor stretching (to pt's tolerance) prior to gait training, performed 3 x 30 seconds                    PT Short Term Goals - 05/25/20 2100      PT SHORT TERM GOAL #1   Title ALL STGS = LTGS             PT Long Term Goals - 05/25/20 2111      PT LONG TERM GOAL #1   Title Pt will be independent with final HEP in order to build upon functional gains made in therapy. ALL LTGS DUE 06/22/20    Baseline 05/25/20 - did not have the chance to fully review    Time 4    Period Weeks    Status On-going    Target Date 06/22/20      PT LONG TERM GOAL #2   Title Pt will decrease TUG time to 3 minutes and 30 seconds or less with RW in order to demo decr fall risk.    Baseline 04/22/20: 5 minutes 59.25 sec's, increased from last assessment, 05/25/20: 3 MINUTES 57 seconds improved from 04/22/20    Time 4    Period Weeks    Status On-going      PT LONG TERM GOAL #3   Title Pt will ambulate at least 27' consecutively with RW with min guard in order to demo improved household mobility.    Baseline 73' with RW with min guard/min A    Time 4    Period Weeks    Status Revised      PT LONG TERM GOAL #4   Title Pt will decr 5x sit <> stand time to 40 seconds or less from mat table with  UE support in order to demo improved functional BLE strength.    Baseline 04/22/20: 51.56 sec's with UE support from standard height chair, improved from 53.79 sec's just not to goal    Time 4    Period Weeks    Status On-going  Plan - 06/08/20 1425    Clinical Impression Statement Attempted supine modified [place] test position to stretch L hip flexors today due to pt demonstrating incr forward flexed posture in standing. Pt able to tolerate stretch well, but needing up to mod A for bed mobility today, esp for L sidelying > sitting, pt taking incr time and needing verbal and manual cues for bed mobility. Pt continues with decr step length B and had more shuffled steps today end of session when more fatigued. Will continue to progrss towards LTGs.    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    Examination-Activity Limitations Bed Mobility;Bend;Locomotion Level;Stairs;Stand;Transfers;Squat    Examination-Participation Restrictions Community Activity    Stability/Clinical Decision Making Stable/Uncomplicated    Rehab Potential Good    PT Frequency 2x / week    PT Duration 4 weeks   3-4   PT Treatment/Interventions ADLs/Self Care Home Management;Gait training;DME Instruction;Stair training;Functional mobility training;Therapeutic activities;Neuromuscular re-education;Balance training;Therapeutic exercise;Patient/family education;Orthotic Fit/Training;Energy conservation;Passive range of motion    PT Next Visit Plan BLE strengthening. standing balance with decr UE support, continue with nustep for stengthening/activity tolerance/working on increased speed with mobility, needs transfer/turning training with RW. stepping strategies to promote increased step length,    Consulted and Agree with Plan of Care Patient;Family  member/caregiver    Family Member Consulted CNA [name]           Patient will benefit from skilled therapeutic intervention in order to improve the following deficits and impairments:  Abnormal gait, Decreased balance, Decreased activity tolerance, Decreased coordination, Decreased endurance, Decreased knowledge of use of DME, Decreased range of motion, Difficulty walking, Decreased strength, Postural dysfunction  Visit Diagnosis: Hemiplegia and hemiparesis following cerebral infarction affecting left non-dominant side (HCC)  Unsteadiness on feet  Muscle weakness (generalized)  Other abnormalities of gait and mobility     Problem List Patient Active Problem List   Diagnosis Date Noted  . Controlled type 2 diabetes mellitus with hyperglycemia, without long-term current use of insulin (HCC) 04/06/2020  . Abnormality of gait 02/03/2020  . Benign essential HTN   . CKD (chronic kidney disease), stage II   . Hemiparesis affecting left side as late effect of stroke (HCC)   . Labile blood glucose   . Labile blood pressure   . Elevated BUN   . Hyponatremia   . Diabetic peripheral neuropathy (HCC)   . 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 Dyslipidemia   . Class 1 obesity due to excess calories with serious comorbidity and body mass index (BMI) of 30.0 to 30.9 in adult   . Left hemiparesis (HCC)   . Controlled type 2 diabetes mellitus with hyperglycemia (HCC)   . Morbid obesity (HCC)   . Leukocytosis   . History of TIA (transient ischemic attack)   . Acute CVA (cerebrovascular accident) (HCC) 01/06/2020  . Acute ischemic stroke (HCC) 01/05/2020  . Hypertensive emergency 01/26/2017  . TIA (transient ischemic attack) 01/26/2017  . Hypothyroidism 01/26/2017  . Aphasia   . Essential hypertension   . Diabetes mellitus without complication (HCC)     Drake Leach , PT, DPT  06/08/2020, 2:27 PM  [facility] [address] [place], [place], [address] Phone: [phone]   Fax:  [phone]  Name: DEMISHA NOKES MRN: [id number] Date of Birth: 09-22-1940

## 2020-06-15 ENCOUNTER — Ambulatory Visit: Payer: Medicare HMO | Admitting: Occupational Therapy

## 2020-06-15 ENCOUNTER — Encounter: Payer: Self-pay | Admitting: Physical Therapy

## 2020-06-15 ENCOUNTER — Encounter: Payer: Self-pay | Admitting: Occupational Therapy

## 2020-06-15 ENCOUNTER — Other Ambulatory Visit: Payer: Self-pay

## 2020-06-15 ENCOUNTER — Ambulatory Visit: Payer: Medicare HMO | Admitting: Physical Therapy

## 2020-06-15 VITALS — BP 141/71 | HR 83

## 2020-06-15 VITALS — BP 121/64

## 2020-06-15 DIAGNOSIS — R2681 Unsteadiness on feet: Secondary | ICD-10-CM

## 2020-06-15 DIAGNOSIS — I69354 Hemiplegia and hemiparesis following cerebral infarction affecting left non-dominant side: Secondary | ICD-10-CM

## 2020-06-15 DIAGNOSIS — M6281 Muscle weakness (generalized): Secondary | ICD-10-CM

## 2020-06-15 DIAGNOSIS — R2689 Other abnormalities of gait and mobility: Secondary | ICD-10-CM

## 2020-06-15 DIAGNOSIS — R278 Other lack of coordination: Secondary | ICD-10-CM

## 2020-06-15 DIAGNOSIS — R29818 Other symptoms and signs involving the nervous system: Secondary | ICD-10-CM

## 2020-06-15 NOTE — Therapy (Signed)
[facility] [address] [place], [place], [address] Phone: [phone]   Fax:  [phone]  Occupational Therapy Treatment  Patient Details  Name: Jill Rose MRN: [id number] Date of Birth: 1940/09/05 No data recorded  Encounter Date: 06/15/2020   OT End of Session - 06/15/20 1433    Visit Number 16    Number of Visits 30    Date for OT Re-Evaluation 07/15/20    Authorization Type Aet[employer] MCR    Authorization - Visit Number 16    Progress Note Due on Visit 20    OT Start Time 1318    OT Stop Time 1400    OT Time Calculation (min) 42 min    Activity Tolerance Patient tolerated treatment well    Behavior During Therapy [facility] for tasks assessed/performed           Past Medical History:  Diagnosis Date  . Diabetes mellitus without complication ([place])   . Essential hypertension   . High cholesterol   . Hypothyroidism   . Stroke ([place])   . TIA (transient ischemic attack) 2018    History reviewed. No pertinent surgical history.  Vitals:   06/15/20 1329 06/15/20 1333  BP: (!) 161/75 (!) 141/71  Pulse: 87 83     Subjective Assessment - 06/15/20 1333    Subjective  Patient indicated headache - 4/10 that started on her way to therapy.  See vitals    Pertinent History Rt MCA CVA 01/05/20. PMH: HTN, HLD, T2DM, Diabetic neuropathy    Limitations no driving, fall risk, HOH    Patient Stated Goals return to cooking    Currently in Pain? Yes    Pain Score 4     Pain Location Head    Pain Orientation Right;Lateral    Pain Descriptors / Indicators Aching    Pain Type Acute pain    Pain Onset Today    Pain Frequency Constant                        OT Treatments/Exercises (OP) - 06/15/20 0001      ADLs   Functional Mobility Worked on level surface squat pivot transfers without physical assistance after set up.  Caregiver present and very encouraging to patient.  Worked on static to dynamic stand  balance with decreased reliance on UE for support.  Patient needs facilitation consistently to unweight right LE to take step in any direction.  With increased time today, patient able to shuffle right and left foot forward without UE support.  Worked on functional use of Left hand to place remove pegs from vertical pegboard.                      OT Short Term Goals - 06/15/20 1435      OT SHORT TERM GOAL #1   Title Independent with coordination HEP Lt hand    Time 4    Period Weeks    Status Partially Met   Issued coordination HEP 03/19/20  Patient cpmpletes with assitance from CNA 05/18/20     OT [place] #2   Title Pt to perform UE dressing with min assist or less and LE dressing with mod assist or less    Time 4    Period Weeks    Status Achieved   Pt reports doing approximately 50% of UB dressing 03/19/20     OT SHORT TERM GOAL #3  Title Pt to perform simple snack prep/cold meal prep standing level with DME and supervision prn    Time 4    Period Weeks    Status Deferred   Pt reports assisting with cutting vegetables with husband 03/19/20 - seated.     OT SHORT TERM GOAL #4   Title Pt to perform sustained high level reaching LUE for endurance    Time 4    Period Weeks    Status Achieved      OT SHORT TERM GOAL #5   Title Pt to return to putting up/styling hair mod I level    Time 4    Period Weeks    Status Partially Met   Pt reports needing assistance for clasps and barettes            OT Long Term Goals - 06/15/20 1435      OT LONG TERM GOAL #1   Title Independent with strengthening HEP for LUE    Time 8    Period Weeks    Status On-going      OT LONG TERM GOAL #2   Title Improve coordination Lt hand as evidenced by performing 9 hole peg test in 41 sec or less    Baseline 48.59 sec    Time 8    Period Weeks    Status On-going      OT LONG TERM GOAL #3   Title Pt to be mod I level with all BADLS    Time 8    Period Weeks    Status On-going       OT LONG TERM GOAL #4   Title Pt to cook simple meal with DME prn and distant supervision    Time 8    Period Weeks    Status On-going      OT LONG TERM GOAL #5   Title Pt to perform light cleaning tasks (washing dishes, folding laundry, etc) mod I level    Time 8    Period Weeks    Status On-going                 Plan - 06/15/20 1434    Clinical Impression Statement Patient continues to show improvement with upright posture in standing.  Patient with improved weight shifting to left and forward in sitting and standing - which is  helping functional mobility and ADL's at home.    OT Occupational Profile and History Detailed Assessment- Review of Records and additional review of physical, cognitive, psychosocial history related to current functional performance    Occupational performance deficits (Please refer to evaluation for details): ADL's;IADL's    Body Structure / Function / Physical Skills ADL;ROM;Balance;Body mechanics;Mobility;Flexibility;Strength;Coordination;FMC;UE functional use;Decreased knowledge of use of DME    Rehab Potential Good    Clinical Decision Making Several treatment options, min-mod task modification necessary    Comorbidities Affecting Occupational Performance: May have comorbidities impacting occupational performance    Modification or Assistance to Complete Evaluation  Min-Moderate modification of tasks or assist with assess necessary to complete eval    OT Frequency 2x / week    OT Duration 8 weeks   Plus eval   OT Treatment/Interventions Self-care/ADL training;Therapeutic exercise;Functional Mobility Training;Neuromuscular education;Manual Therapy;Therapeutic activities;Coping strategies training;DME and/or AE instruction;Cognitive remediation/compensation;Moist Heat;Passive range of motion;Patient/family education    Plan continue wt shifts in standing (maybe in kitchen in prep for cooking), mid to high level reach LUE    Consulted and Agree  with Plan of Care  Patient;Family member/caregiver    Family Member Consulted caregiver CNA           Patient will benefit from skilled therapeutic intervention in order to improve the following deficits and impairments:   Body Structure / Function / Physical Skills: ADL, ROM, Balance, Body mechanics, Mobility, Flexibility, Strength, Coordination, FMC, UE functional use, Decreased knowledge of use of DME       Visit Diagnosis: Other lack of coordination  Hemiplegia and hemiparesis following cerebral infarction affecting left non-dominant side (HCC)  Unsteadiness on feet  Muscle weakness (generalized)  Other symptoms and signs involving the nervous system    Problem List Patient Active Problem List   Diagnosis Date Noted  . Controlled type 2 diabetes mellitus with hyperglycemia, without long-term current use of insulin ([place]) 04/06/2020  . Abnormality of gait 02/03/2020  . Benign essential HTN   . CKD (chronic kidney disease), stage II   . Hemiparesis affecting left side as late effect of stroke ([place])   . Labile blood glucose   . Labile blood pressure   . Elevated BUN   . Hyponatremia   . Diabetic peripheral neuropathy ([place])   . Right middle cerebral artery stroke ([place]) 01/14/2020  . Dyslipidemia   . Class 1 obesity due to excess calories with serious comorbidity and body mass index (BMI) of 30.0 to 30.9 in adult   . Left hemiparesis ([place])   . Controlled type 2 diabetes mellitus with hyperglycemia ([place])   . Morbid obesity ([place])   . Leukocytosis   . History of TIA (transient ischemic attack)   . Acute CVA (cerebrovascular accident) ([place]) 01/06/2020  . Acute ischemic stroke ([place]) 01/05/2020  . Hypertensive emergency 01/26/2017  . TIA (transient ischemic attack) 01/26/2017  . Hypothyroidism 01/26/2017  . Aphasia   . Essential hypertension   . Diabetes mellitus without complication [facility])     Mariah Milling, OTR/L 06/15/2020, 2:36 PM  [facility] [address] [place], [place], [address] Phone: [phone]   Fax:  [phone]  Name: VELEKA DJORDJEVIC MRN: [id number] Date of Birth: 24-Aug-1940

## 2020-06-16 NOTE — Therapy (Signed)
[facility] [address] [place], [place], [address] Phone: [phone]   Fax:  [phone]  Physical Therapy Treatment  Patient Details  Name: Jill Rose MRN: [id number] Date of Birth: Sep 23, 1940 Referring Provider (PT): Clide Dales, [place] (being followed by Dr. Allena Katz)   Encounter Date: 06/15/2020   PT End of Session - 06/15/20 1409    Visit Number 20    Number of Visits 24    Date for PT Re-Evaluation 06/25/20   written for additional 4 week POC   Authorization Type [employer]- $35 co pay per day regardless of discipline    PT Start Time 1402    PT Stop Time 1445    PT Time Calculation (min) 43 min    Equipment Utilized During Treatment Gait belt    Activity Tolerance Patient tolerated treatment well;Patient limited by fatigue    Behavior During Therapy [facility] for tasks assessed/performed           Past Medical History:  Diagnosis Date  . Diabetes mellitus without complication (HCC)   . Essential hypertension   . High cholesterol   . Hypothyroidism   . Stroke (HCC)   . TIA (transient ischemic attack) 2018    History reviewed. No pertinent surgical history.  Vitals:   06/15/20 1406  BP: 121/64     Subjective Assessment - 06/15/20 1406    Subjective No new complaints. No falls. Had a headache at the start of OT, none now. Continues to report bil knee pain with activity. Pre-medicated with Tylenol before coming to therapies today.    Patient is accompained by: Family member   caregiver   Pertinent History diabetes mellitus, HTN, hypothyroidism, TIA (2018)    Patient Stated Goals wants to be walking more.    Currently in Pain? Yes    Pain Score 4     Pain Location Head    Pain Orientation Right;Lateral    Pain Descriptors / Indicators Headache    Pain Type Acute pain    Pain Onset Today    Pain Frequency Constant    Aggravating Factors  unsure    Pain Relieving Factors tylenol     Multiple Pain Sites Yes    Pain Score 5    Pain Location Knee    Pain Orientation Right;Left    Pain Descriptors / Indicators Aching;Dull    Pain Type Chronic pain    Pain Onset More than a month ago    Pain Frequency Intermittent    Aggravating Factors  increased activity    Pain Relieving Factors rest, tylenol                  [facility] Adult PT Treatment/Exercise - 06/15/20 1412      Transfers   Transfers Sit to Stand;Stand to Sit    Sit to Stand 4: Min guard;With upper extremity assist;From chair/3-in-1    Sit to Stand Details Tactile cues for weight shifting;Verbal cues for sequencing;Verbal cues for technique;Verbal cues for safe use of DME/AE;Manual facilitation for weight shifting;Manual facilitation for placement    Stand to Sit 4: Min guard;With upper extremity assist;To chair/3-in-1    Stand to Sit Details (indicate cue type and reason) Tactile cues for weight shifting;Tactile cues for sequencing;Verbal cues for sequencing;Verbal cues for technique;Verbal cues for safe use of DME/AE;Manual facilitation for weight shifting      Ambulation/Gait   Ambulation/Gait Yes    Ambulation/Gait Assistance 4: Min assist    Ambulation/Gait  Assistance Details assist/faciliation needed for posutre and weight shifting to allow for larger step length.     Ambulation Distance (Feet) 10 Feet   x1, 20 x1   Assistive device Rolling walker    Gait Pattern Step-to pattern;Decreased step length - right;Decreased step length - left;Decreased dorsiflexion - left;Decreased dorsiflexion - right;Right foot flat;Left foot flat;Poor foot clearance - left;Poor foot clearance - right;Shuffle;Step-through pattern;Decreased stance time - right;Decreased stride length    Ambulation Surface Level;Indoor      Neuro Re-ed    Neuro Re-ed Details  for balance/strengthening: standign with RW support- 4 inch box foot taps, yard stick- stpeppign over/back      Exercises   Exercises Other Exercises       Knee/Hip Exercises: Seated   Long Arc Quad AROM;Strengthening;Both;1 set;10 reps;Weights;Limitations    Long Arc Quad Weight 2 lbs.    Long [employer] Limitations cues for slow, controlled movements    Hamstring Curl AROM;Strengthening;Both;1 set;10 reps;Limitations    Hamstring Limitations with red band resistance with cues for slow, controlled movements.                     PT Short Term Goals - 05/25/20 2100      PT SHORT TERM GOAL #1   Title ALL STGS = LTGS             PT Long Term Goals - 05/25/20 2111      PT LONG TERM GOAL #1   Title Pt will be independent with final HEP in order to build upon functional gains made in therapy. ALL LTGS DUE 06/22/20    Baseline 05/25/20 - did not have the chance to fully review    Time 4    Period Weeks    Status On-going    Target Date 06/22/20      PT LONG TERM GOAL #2   Title Pt will decrease TUG time to 3 minutes and 30 seconds or less with RW in order to demo decr fall risk.    Baseline 04/22/20: 5 minutes 59.25 sec's, increased from last assessment, 05/25/20: 3 MINUTES 57 seconds improved from 04/22/20    Time 4    Period Weeks    Status On-going      PT LONG TERM GOAL #3   Title Pt will ambulate at least 7' consecutively with RW with min guard in order to demo improved household mobility.    Baseline 71' with RW with min guard/min A    Time 4    Period Weeks    Status Revised      PT LONG TERM GOAL #4   Title Pt will decr 5x sit <> stand time to 40 seconds or less from mat table with UE support in order to demo improved functional BLE strength.    Baseline 04/22/20: 51.56 sec's with UE support from standard height chair, improved from 53.79 sec's just not to goal    Time 4    Period Weeks    Status On-going                 Plan - 06/15/20 1409    Clinical Impression Statement Today's skilled session continued to focus on LE strengthening, stepping strategies and gait with RW. Rest breaks taken as needed. No  other issues noted or reported. The pt should benefit from continued PT to progress toward unmet goals.    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    Examination-Activity Limitations Bed Mobility;Bend;Locomotion Level;Stairs;Stand;Transfers;Squat    Examination-Participation Restrictions Community Activity    Stability/Clinical Decision Making Stable/Uncomplicated    Rehab Potential Good    PT Frequency 2x / week    PT Duration 4 weeks   3-4   PT Treatment/Interventions ADLs/Self Care Home Management;Gait training;DME Instruction;Stair training;Functional mobility training;Therapeutic activities;Neuromuscular re-education;Balance training;Therapeutic exercise;Patient/family education;Orthotic Fit/Training;Energy conservation;Passive range of motion    PT Next Visit Plan BLE strengthening. standing balance with decr UE support, continue with nustep for stengthening/activity tolerance/working on increased speed with mobility, needs transfer/turning training with RW. stepping strategies to promote increased step length,    Consulted and Agree with Plan of Care Patient;Family member/caregiver    Family Member Consulted CNA [name]           Patient will benefit from skilled therapeutic intervention in order to improve the following deficits and impairments:  Abnormal gait, Decreased balance, Decreased activity tolerance, Decreased coordination, Decreased endurance, Decreased knowledge of use of DME, Decreased range of motion, Difficulty walking, Decreased strength, Postural dysfunction  Visit Diagnosis: Hemiplegia and hemiparesis following cerebral infarction affecting left non-dominant side (HCC)  Unsteadiness on feet  Muscle weakness (generalized)  Other abnormalities of gait and mobility     Problem List Patient Active Problem List     Diagnosis Date Noted  . Controlled type 2 diabetes mellitus with hyperglycemia, without long-term current use of insulin (HCC) 04/06/2020  . Abnormality of gait 02/03/2020  . Benign essential HTN   . CKD (chronic kidney disease), stage II   . Hemiparesis affecting left side as late effect of stroke (HCC)   . Labile blood glucose   . Labile blood pressure   . Elevated BUN   . Hyponatremia   . Diabetic peripheral neuropathy (HCC)   . 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 Dyslipidemia   . Class 1 obesity due to excess calories with serious comorbidity and body mass index (BMI) of 30.0 to 30.9 in adult   . Left hemiparesis (HCC)   . Controlled type 2 diabetes mellitus with hyperglycemia (HCC)   . Morbid obesity (HCC)   . Leukocytosis   . History of TIA (transient ischemic attack)   . Acute CVA (cerebrovascular accident) (HCC) 01/06/2020  . Acute ischemic stroke (HCC) 01/05/2020  . Hypertensive emergency 01/26/2017  . TIA (transient ischemic attack) 01/26/2017  . Hypothyroidism 01/26/2017  . Aphasia   . Essential hypertension   . Diabetes mellitus without complication (HCC)     Sallyanne Kuster, PTA, [facility] [address] [place], [place] [address] [phone] 06/16/20, 5:01 PM   Name: Jill Rose MRN: [id number] Date of Birth: 09/10/1940

## 2020-06-17 ENCOUNTER — Ambulatory Visit: Payer: Medicare HMO | Admitting: Neurology

## 2020-06-17 ENCOUNTER — Ambulatory Visit: Payer: Medicare HMO | Admitting: Physical Therapy

## 2020-06-17 ENCOUNTER — Ambulatory Visit: Payer: Medicare HMO | Admitting: Occupational Therapy

## 2020-06-18 ENCOUNTER — Ambulatory Visit: Payer: Medicare HMO | Attending: Physician Assistant | Admitting: Physical Therapy

## 2020-06-18 DIAGNOSIS — R29818 Other symptoms and signs involving the nervous system: Secondary | ICD-10-CM | POA: Insufficient documentation

## 2020-06-18 DIAGNOSIS — R2681 Unsteadiness on feet: Secondary | ICD-10-CM | POA: Insufficient documentation

## 2020-06-18 DIAGNOSIS — R278 Other lack of coordination: Secondary | ICD-10-CM | POA: Insufficient documentation

## 2020-06-18 DIAGNOSIS — R2689 Other abnormalities of gait and mobility: Secondary | ICD-10-CM | POA: Insufficient documentation

## 2020-06-18 DIAGNOSIS — M6281 Muscle weakness (generalized): Secondary | ICD-10-CM | POA: Insufficient documentation

## 2020-06-18 DIAGNOSIS — I69354 Hemiplegia and hemiparesis following cerebral infarction affecting left non-dominant side: Secondary | ICD-10-CM | POA: Insufficient documentation

## 2020-06-22 ENCOUNTER — Ambulatory Visit: Payer: Medicare HMO | Admitting: Physical Therapy

## 2020-06-22 ENCOUNTER — Encounter: Payer: Self-pay | Admitting: Physical Therapy

## 2020-06-22 ENCOUNTER — Other Ambulatory Visit: Payer: Self-pay

## 2020-06-22 ENCOUNTER — Ambulatory Visit: Payer: Medicare HMO | Admitting: Occupational Therapy

## 2020-06-22 VITALS — BP 136/65 | HR 81

## 2020-06-22 VITALS — BP 176/97 | HR 84

## 2020-06-22 DIAGNOSIS — M6281 Muscle weakness (generalized): Secondary | ICD-10-CM

## 2020-06-22 DIAGNOSIS — I69354 Hemiplegia and hemiparesis following cerebral infarction affecting left non-dominant side: Secondary | ICD-10-CM | POA: Diagnosis not present

## 2020-06-22 DIAGNOSIS — R29818 Other symptoms and signs involving the nervous system: Secondary | ICD-10-CM

## 2020-06-22 DIAGNOSIS — R2681 Unsteadiness on feet: Secondary | ICD-10-CM | POA: Diagnosis present

## 2020-06-22 DIAGNOSIS — R278 Other lack of coordination: Secondary | ICD-10-CM

## 2020-06-22 DIAGNOSIS — R2689 Other abnormalities of gait and mobility: Secondary | ICD-10-CM | POA: Diagnosis present

## 2020-06-22 NOTE — Therapy (Signed)
[facility] [address] [place], [place], [address] Phone: [phone]   Fax:  [phone]  Occupational Therapy Treatment  Patient Details  Name: Jill Rose MRN: [id number] Date of Birth: Jan 04, 1941 No data recorded  Encounter Date: 06/22/2020   OT End of Session - 06/22/20 1448    Visit Number 17    Number of Visits 30    Date for OT Re-Evaluation 07/15/20    Authorization Type [employer] MCR    Authorization - Visit Number 17    Progress Note Due on Visit 20    OT Start Time 1448    OT Stop Time 1530    OT Time Calculation (min) 42 min    Activity Tolerance Patient tolerated treatment well    Behavior During Therapy [facility] for tasks assessed/performed           Past Medical History:  Diagnosis Date  . Diabetes mellitus without complication ([place])   . Essential hypertension   . High cholesterol   . Hypothyroidism   . Stroke ([place])   . TIA (transient ischemic attack) 2018    No past surgical history on file.  Vitals:   06/22/20 1519 06/22/20 1520  BP: (!) 189/95 (!) 176/97  Pulse: 85 84     Subjective Assessment - 06/22/20 1449    Subjective  Pt reports her knees sometimes hurts but denies pain currently. Reports "cold weather sometimes makes it hurt"    Patient is accompanied by: --   caregiver   Pertinent History Rt MCA CVA 01/05/20. PMH: HTN, HLD, T2DM, Diabetic neuropathy    Limitations no driving, fall risk, HOH    Patient Stated Goals return to cooking    Currently in Pain? No/denies                        OT Treatments/Exercises (OP) - 06/22/20 1521      Neurological Re-education Exercises   Weight-Shifting Exercises - Lateral weight shifting laterally while standing with RW and SBA . Pt with min weight shift and requiring mod verbal and tactile cue sfor increasing weight shift out of midline. pt did first set with resistance clothespins 1-8# and second set in kitchen while  standing at counter and reaching up overhead cabinet height      Functional Reaching Activities   High Level w resistance clothespins 1-8#  with LUE in sitting      Fine Motor Coordination (Hand/Wrist)   Fine Motor Coordination Grooved pegs    Grooved pegs with LUE with min drops and increased time.                     OT Short Term Goals - 06/15/20 1435      OT SHORT TERM GOAL #1   Title Independent with coordination HEP Lt hand    Time 4    Period Weeks    Status Partially Met   Issued coordination HEP 03/19/20  Patient cpmpletes with assitance from CNA 05/18/20     OT [place] #2   Title Pt to perform UE dressing with min assist or less and LE dressing with mod assist or less    Time 4    Period Weeks    Status Achieved   Pt reports doing approximately 50% of UB dressing 03/19/20     OT SHORT TERM GOAL #3   Title Pt to perform simple snack prep/cold meal prep standing level  with DME and supervision prn    Time 4    Period Weeks    Status Deferred   Pt reports assisting with cutting vegetables with husband 03/19/20 - seated.     OT SHORT TERM GOAL #4   Title Pt to perform sustained high level reaching LUE for endurance    Time 4    Period Weeks    Status Achieved      OT SHORT TERM GOAL #5   Title Pt to return to putting up/styling hair mod I level    Time 4    Period Weeks    Status Partially Met   Pt reports needing assistance for clasps and barettes            OT Long Term Goals - 06/15/20 1435      OT LONG TERM GOAL #1   Title Independent with strengthening HEP for LUE    Time 8    Period Weeks    Status On-going      OT LONG TERM GOAL #2   Title Improve coordination Lt hand as evidenced by performing 9 hole peg test in 41 sec or less    Baseline 48.59 sec    Time 8    Period Weeks    Status On-going      OT LONG TERM GOAL #3   Title Pt to be mod I level with all BADLS    Time 8    Period Weeks    Status On-going      OT LONG TERM  GOAL #4   Title Pt to cook simple meal with DME prn and distant supervision    Time 8    Period Weeks    Status On-going      OT LONG TERM GOAL #5   Title Pt to perform light cleaning tasks (washing dishes, folding laundry, etc) mod I level    Time 8    Period Weeks    Status On-going                 Plan - 06/22/20 1613    Clinical Impression Statement Pt with brighter affect in session today. Pt increasing stride length with ambulation and functional mobility and increasing weight shifts out of midline for functional reaching.    OT Occupational Profile and History Detailed Assessment- Review of Records and additional review of physical, cognitive, psychosocial history related to current functional performance    Occupational performance deficits (Please refer to evaluation for details): ADL's;IADL's    Body Structure / Function / Physical Skills ADL;ROM;Balance;Body mechanics;Mobility;Flexibility;Strength;Coordination;FMC;UE functional use;Decreased knowledge of use of DME    Rehab Potential Good    Clinical Decision Making Several treatment options, min-mod task modification necessary    Comorbidities Affecting Occupational Performance: May have comorbidities impacting occupational performance    Modification or Assistance to Complete Evaluation  Min-Moderate modification of tasks or assist with assess necessary to complete eval    OT Frequency 2x / week    OT Duration 8 weeks   Plus eval   OT Treatment/Interventions Self-care/ADL training;Therapeutic exercise;Functional Mobility Training;Neuromuscular education;Manual Therapy;Therapeutic activities;Coping strategies training;DME and/or AE instruction;Cognitive remediation/compensation;Moist Heat;Passive range of motion;Patient/family education    Plan continue wt shifts in standing (maybe in kitchen in prep for cooking), mid to high level reach LUE    Consulted and Agree with Plan of Care Patient;Family member/caregiver     Family Member Consulted caregiver CNA  Patient will benefit from skilled therapeutic intervention in order to improve the following deficits and impairments:   Body Structure / Function / Physical Skills: ADL, ROM, Balance, Body mechanics, Mobility, Flexibility, Strength, Coordination, FMC, UE functional use, Decreased knowledge of use of DME       Visit Diagnosis: Other lack of coordination  Hemiplegia and hemiparesis following cerebral infarction affecting left non-dominant side (HCC)  Unsteadiness on feet  Muscle weakness (generalized)  Other symptoms and signs involving the nervous system    Problem List Patient Active Problem List   Diagnosis Date Noted  . Controlled type 2 diabetes mellitus with hyperglycemia, without long-term current use of insulin ([place]) 04/06/2020  . Abnormality of gait 02/03/2020  . Benign essential HTN   . CKD (chronic kidney disease), stage II   . Hemiparesis affecting left side as late effect of stroke ([place])   . Labile blood glucose   . Labile blood pressure   . Elevated BUN   . Hyponatremia   . Diabetic peripheral neuropathy ([place])   . Right middle cerebral artery stroke ([place]) 01/14/2020  . Dyslipidemia   . Class 1 obesity due to excess calories with serious comorbidity and body mass index (BMI) of 30.0 to 30.9 in adult   . Left hemiparesis ([place])   . Controlled type 2 diabetes mellitus with hyperglycemia ([place])   . Morbid obesity ([place])   . Leukocytosis   . History of TIA (transient ischemic attack)   . Acute CVA (cerebrovascular accident) ([place]) 01/06/2020  . Acute ischemic stroke ([place]) 01/05/2020  . Hypertensive emergency 01/26/2017  . TIA (transient ischemic attack) 01/26/2017  . Hypothyroidism 01/26/2017  . Aphasia   . Essential hypertension   . Diabetes mellitus without complication [facility])     Zachery Conch MOT, OTR/L  06/22/2020, 4:13 PM  [facility] [address] [place], [place], [address] Phone: [phone]   Fax:  [phone]  Name: Jill Rose MRN: [id number] Date of Birth: 08/07/1940

## 2020-06-23 ENCOUNTER — Ambulatory Visit: Payer: Medicare HMO | Admitting: Physical Therapy

## 2020-06-23 ENCOUNTER — Encounter: Payer: Medicare HMO | Admitting: Occupational Therapy

## 2020-06-23 NOTE — Therapy (Addendum)
[facility] [address] [place], [place], [address] Phone: [phone]   Fax:  [phone]  Physical Therapy Treatment/Re-Cert   Patient Details  Name: Jill Rose MRN: [id number] Date of Birth: [birth year] Referring Provider (PT): Fortino Sic, [place] (being followed by Dr. Posey Pronto)   Encounter Date: 06/22/2020    06/23/20 0834  PT Visits / Re-Eval  Visit Number 21  Number of Visits 27  Date for PT Re-Evaluation 69/62/95 (per re-cert on 28/4/13)  Authorization  Authorization Type [employer]- $35 co pay per day regardless of discipline  PT Time Calculation  PT Start Time 1533  PT Stop Time 1613  PT Time Calculation (min) 40 min  PT - End of Session  Equipment Utilized During Treatment Gait belt  Activity Tolerance Patient tolerated treatment well;Patient limited by fatigue  Behavior During Therapy [facility] for tasks assessed/performed    Past Medical History:  Diagnosis Date  . Diabetes mellitus without complication ([place])   . Essential hypertension   . High cholesterol   . Hypothyroidism   . Stroke ([place])   . TIA (transient ischemic attack) 2018    History reviewed. No pertinent surgical history.  Vitals:   06/22/20 1538  BP: 136/65  Pulse: 81     Subjective Assessment - 06/22/20 1535    Subjective Things are going well at home. Had elevated BP after standing and cooking cabbage with OT.    Patient is accompained by: Family member   caregiver   Pertinent History diabetes mellitus, HTN, hypothyroidism, TIA (2018)    Patient Stated Goals wants to be walking more.    Currently in Pain? No/denies    Pain Onset Today    Pain Onset More than a month ago                       06/25/20 0001  Assessment  Medical Diagnosis R MCA CVA  Referring Provider (PT) Fortino Sic, [place] (being followed by Dr. Posey Pronto)  Onset Date/Surgical Date 01/05/20           [facility] Adult PT  Treatment/Exercise - 06/22/20 1549      Transfers   Transfers Sit to Stand;Stand to Sit    Sit to Stand 4: Min guard;With upper extremity assist;From chair/3-in-1    Sit to Stand Details Tactile cues for weight shifting;Verbal cues for sequencing;Verbal cues for technique;Verbal cues for safe use of DME/AE;Manual facilitation for weight shifting;Manual facilitation for placement    Sit to Stand Details (indicate cue type and reason) cues for proper UE placement     Stand to Sit 4: Min guard;With upper extremity assist;To chair/3-in-1    Stand to Sit Details (indicate cue type and reason) Tactile cues for weight shifting;Tactile cues for sequencing;Verbal cues for sequencing;Verbal cues for technique;Verbal cues for safe use of DME/AE;Manual facilitation for weight shifting    Comments x6 reps total throughout session from pt's w/c w/ RW : cues first to scoot towards edge, have feet underneath patient and perform with nose over toes - once in standing cues for erect posture/glute activation      Ambulation/Gait   Ambulation/Gait Yes    Ambulation/Gait Assistance 4: Min assist    Ambulation/Gait Assistance Details cues and assist for RW propulsion, incr step length B, and manual assist for weight shifting to help facilitate incr step length. Pt with forward flexed posture during gait.    Ambulation Distance (Feet) 36 Feet   x1  Assistive device Rolling walker    Gait Pattern Step-to pattern;Decreased step length - right;Decreased step length - left;Decreased dorsiflexion - left;Decreased dorsiflexion - right;Right foot flat;Left foot flat;Poor foot clearance - left;Poor foot clearance - right;Shuffle;Step-through pattern;Decreased stance time - right;Decreased stride length    Ambulation Surface Level;Indoor    Pre-Gait Activities standing at RW: with yard stick in front of pt alternating stepping over yardstick and tapping foot to colored target x10 reps B, pt initially getting foot stuck on  yardstick but improving with foot clearance with incr reps      Exercises   Exercises Other Exercises    Other Exercises  seated with red tband around thighs: alternating marching with visual cues to lift high to tap boomwhacker:  2 x 10 reps B, seated L hamstring curls with therapist providing red theraband resistance - cues for full ROM and control 2 x10 reps      Knee/Hip Exercises: Seated   Long Arc Quad AROM;Strengthening;10 reps;Weights;Limitations;Both;2 sets    Long Arc Quad Weight 2 lbs.    Long [employer] Limitations with visual cue from therapist on how high to kick leg                    PT Short Term Goals - 05/25/20 2100      PT SHORT TERM GOAL #1   Title ALL STGS = LTGS             PT Long Term Goals - 06/23/20 0837      PT LONG TERM GOAL #1   Title Pt will be independent with final HEP in order to build upon functional gains made in therapy. ALL LTGS DUE 06/22/20    Baseline 05/25/20 - did not have the chance to fully review    Time 4    Period Weeks    Status On-going      PT LONG TERM GOAL #2   Title Pt will decrease TUG time to 3 minutes and 30 seconds or less with RW in order to demo decr fall risk.    Baseline 04/22/20: 5 minutes 59.25 sec's, increased from last assessment, 05/25/20: 3 MINUTES 57 seconds improved from 04/22/20    Time 4    Period Weeks    Status On-going      PT LONG TERM GOAL #3   Title Pt will ambulate at least 25' consecutively with RW with min guard in order to demo improved household mobility.    Baseline 67' with RW with min guard/min A    Time 4    Period Weeks    Status Not Met      PT LONG TERM GOAL #4   Title Pt will decr 5x sit <> stand time to 40 seconds or less from mat table with UE support in order to demo improved functional BLE strength.    Baseline 04/22/20: 51.56 sec's with UE support from standard height chair, improved from 53.79 sec's just not to goal    Time 4    Period Weeks    Status On-going             On-going LTGs for re-cert:     PT Long Term Goals - 06/25/20 1340      PT LONG TERM GOAL #1   Title Pt will be independent with final HEP in order to build upon functional gains made in therapy. ALL LTGS DUE 07/16/20    Baseline 05/25/20 - did not have the  chance to fully review    Time 3    Period Weeks    Status On-going    Target Date 07/16/20      PT LONG TERM GOAL #2   Title Pt will decrease TUG time to 3 minutes and 30 seconds or less with RW in order to demo decr fall risk.    Baseline 04/22/20: 5 minutes 59.25 sec's, increased from last assessment, 05/25/20: 3 MINUTES 57 seconds improved from 04/22/20    Time 3    Period Weeks    Status On-going      PT LONG TERM GOAL #3   Title Pt will ambulate at least 4' consecutively with RW with min guard in order to demo improved household mobility.    Baseline 27' with RW with min guard/min A    Time 3    Period Weeks    Status On-going    Target Date 07/09/20      PT LONG TERM GOAL #4   Title Pt will decr 5x sit <> stand time to 40 seconds or less from mat table with UE support in order to demo improved functional BLE strength.    Baseline 04/22/20: 51.56 sec's with UE support from standard height chair, improved from 53.79 sec's just not to goal    Time 3    Period Weeks    Status On-going               06/23/20 0835  Plan  Clinical Impression Statement Pt's BP WFL for therapy. Today's skilled session continued to focus on BLE strengthening, gait with RW and activities to promote improved foot clearance/step length. Pt continues to ambulate with a step to pattern with shuffled steps even with multi-modal cueing, however pt with brief instances of incr stride length. Rest breaks taken as needed. Will continue to progress towards LTGs. Addendum on 06/25/20: pt cancelled appt today when remainder of LTGs were going to be checked/re-cert performed. Pt did not meet LTG #3, pt only able to ambulate 34' consecutively with min  guard/min A before needing a seated rest break. Pt continues to need verbal and demo cues for proper sit <> stand technique with RW. Will re-cert for an additional 1-2x week for 3weeks in order to check remaining LTGs, work on activity tolerance/gait distance and finalizing HEP. Due to pt with a plateau in progress and having a consistent CNA to work on exercises/gait at home, anticipate D/C at end of this current POC and having pt take a break from PT at this time.  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  Examination-Activity Limitations Bed Mobility;Bend;Locomotion Level;Stairs;Stand;Transfers;Squat  Examination-Participation Restrictions Community Activity  Pt will benefit from skilled therapeutic intervention in order to improve on the following deficits Abnormal gait;Decreased balance;Decreased activity tolerance;Decreased coordination;Decreased endurance;Decreased knowledge of use of DME;Decreased range of motion;Difficulty walking;Decreased strength;Postural dysfunction  Stability/Clinical Decision Making Stable/Uncomplicated  Rehab Potential Good  PT Frequency 1-2x / week  PT Duration 3 weeks  PT Treatment/Interventions ADLs/Self Care Home Management;Gait training;DME Instruction;Stair training;Functional mobility training;Therapeutic activities;Neuromuscular re-education;Balance training;Therapeutic exercise;Patient/family education;Orthotic Fit/Training;Energy conservation;Passive range of motion  PT Next Visit Plan BLE strengthening. standing balance with decr UE support, continue with nustep for stengthening/activity tolerance/working on increased speed with mobility, needs transfer/turning training with RW. stepping strategies to promote increased step length,  Consulted and Agree with Plan of Care Patient;Family member/caregiver  Family  Member Consulted CNA [name]      Patient will benefit from skilled therapeutic intervention in order to improve the following deficits and impairments:  Abnormal gait, Decreased balance, Decreased activity tolerance, Decreased coordination, Decreased endurance, Decreased knowledge of use of DME, Decreased range of motion, Difficulty walking, Decreased strength, Postural dysfunction  Visit Diagnosis: Hemiplegia and hemiparesis following cerebral infarction affecting left non-dominant side (HCC)  Other lack of coordination  Unsteadiness on feet  Muscle weakness (generalized)     Problem List Patient Active Problem List   Diagnosis Date Noted  . Controlled type 2 diabetes mellitus with hyperglycemia, without long-term current use of insulin ([place]) 04/06/2020  . Abnormality of gait 02/03/2020  . Benign essential HTN   . CKD (chronic kidney disease), stage II   . Hemiparesis affecting left side as late effect of stroke ([place])   . Labile blood glucose   . Labile blood pressure   . Elevated BUN   . Hyponatremia   . Diabetic peripheral neuropathy ([place])   . 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 Dyslipidemia   . Class 1 obesity due to excess calories with serious comorbidity and body mass index (BMI) of 30.0 to 30.9 in adult   . Left hemiparesis ([place])   . Controlled type 2 diabetes mellitus with hyperglycemia ([place])   . Morbid obesity ([place])   . Leukocytosis   . History of TIA (transient ischemic attack)   . Acute CVA (cerebrovascular accident) ([place]) 01/06/2020  . Acute ischemic stroke ([place]) 01/05/2020  . Hypertensive emergency 01/26/2017  . TIA (transient ischemic attack) 01/26/2017  . Hypothyroidism 01/26/2017  . Aphasia   . Essential hypertension   . Diabetes mellitus without complication ([place])     Arliss Journey, PT, DPT  06/23/2020, 5:05 PM  [facility] [address] [place], [place],  [address] Phone: [phone]   Fax:  [phone]  Name: ARISBETH PURRINGTON MRN: [id number] Date of Birth: Dec 04, 1940   Addendum on for re-cert:  Janann August, PT, DPT 06/25/20 1:35 PM

## 2020-06-25 ENCOUNTER — Ambulatory Visit: Payer: Medicare HMO | Admitting: Physical Therapy

## 2020-06-25 NOTE — Addendum Note (Signed)
Addended by: Drake Leach on: 06/25/2020 01:47 PM   Modules accepted: Orders

## 2020-06-29 ENCOUNTER — Ambulatory Visit: Payer: Medicare HMO | Admitting: Physical Therapy

## 2020-06-29 ENCOUNTER — Encounter: Payer: Self-pay | Admitting: Physical Therapy

## 2020-06-29 ENCOUNTER — Ambulatory Visit: Payer: Medicare HMO | Admitting: Occupational Therapy

## 2020-06-29 ENCOUNTER — Other Ambulatory Visit: Payer: Self-pay

## 2020-06-29 DIAGNOSIS — M6281 Muscle weakness (generalized): Secondary | ICD-10-CM

## 2020-06-29 DIAGNOSIS — R2681 Unsteadiness on feet: Secondary | ICD-10-CM

## 2020-06-29 DIAGNOSIS — R2689 Other abnormalities of gait and mobility: Secondary | ICD-10-CM

## 2020-06-29 DIAGNOSIS — I69354 Hemiplegia and hemiparesis following cerebral infarction affecting left non-dominant side: Secondary | ICD-10-CM

## 2020-06-29 DIAGNOSIS — R29818 Other symptoms and signs involving the nervous system: Secondary | ICD-10-CM

## 2020-06-29 NOTE — Patient Instructions (Signed)
For use of pt's bike at home:  1. With seat slide all the way back, pt can sit on the seat and then turn her hips forward to face the front of the bike (like sitting on car seat and then turning around).  2. Low resistance with emphasis on full range of motion with speed (hopefully to carry over to increased motion and speed with walking).  3. Start with short time of 8-10 minutes. Slowly increase the time as Jill Rose gets stronger to a 20 minute workout time.

## 2020-06-30 ENCOUNTER — Encounter: Payer: Medicare HMO | Admitting: Occupational Therapy

## 2020-06-30 ENCOUNTER — Ambulatory Visit: Payer: Medicare HMO | Admitting: Physical Therapy

## 2020-06-30 ENCOUNTER — Other Ambulatory Visit: Payer: Self-pay | Admitting: Neurology

## 2020-06-30 NOTE — Therapy (Signed)
[facility] [address] [place], [place], [address] Phone: [phone]   Fax:  [phone]  Physical Therapy Treatment  Patient Details  Name: Jill Rose MRN: [id number] Date of Birth: 04/25/1941 Referring Provider (PT): Clide Dales, [place] (being followed by Dr. Allena Katz)   Encounter Date: 06/29/2020   PT End of Session - 06/29/20 1324    Visit Number 22    Number of Visits 27    Date for PT Re-Evaluation 07/22/20   per recert on 06/22/20   Authorization Type [employer] Medicare- $35 co pay per day regardless of discipline    PT Start Time 1320    PT Stop Time 1400    PT Time Calculation (min) 40 min    Equipment Utilized During Treatment Gait belt    Activity Tolerance Patient tolerated treatment well;Patient limited by fatigue    Behavior During Therapy [facility] for tasks assessed/performed           Past Medical History:  Diagnosis Date  . Diabetes mellitus without complication (HCC)   . Essential hypertension   . High cholesterol   . Hypothyroidism   . Stroke (HCC)   . TIA (transient ischemic attack) 2018    History reviewed. No pertinent surgical history.  There were no vitals filed for this visit.   Subjective Assessment - 06/29/20 1323    Subjective No new complaitns. No falls or pain to report.    Patient is accompained by: Family member   caregiver   Pertinent History diabetes mellitus, HTN, hypothyroidism, TIA (2018)    Patient Stated Goals wants to be walking more.    Currently in Pain? No/denies    Pain Score 0-No pain                [facility] Adult PT Treatment/Exercise - 06/29/20 1325      Transfers   Transfers Sit to Stand;Stand to Sit    Sit to Stand 4: Min guard;With upper extremity assist;From chair/3-in-1    Stand to Sit 4: Min guard;With upper extremity assist;To chair/3-in-1    Stand to Sit Details (indicate cue type and reason) Tactile cues for weight shifting;Tactile cues for  sequencing;Verbal cues for sequencing;Verbal cues for technique;Verbal cues for safe use of DME/AE;Manual facilitation for weight shifting    Stand Pivot Transfers 4: Min assist    Stand Pivot Transfer Details (indicate cue type and reason) for 90 degree turns with walker to sit down on surface with max cues on sequencing, posture and walker management.      Ambulation/Gait   Ambulation/Gait Yes    Ambulation/Gait Assistance 4: Min assist    Ambulation/Gait Assistance Details cues, assist and faciltation for weight shifting, increased step legnth    Ambulation Distance (Feet) 20 Feet   x2, 35 x1   Assistive device Rolling walker    Gait Pattern Step-to pattern;Decreased step length - right;Decreased step length - left;Decreased dorsiflexion - left;Decreased dorsiflexion - right;Right foot flat;Left foot flat;Poor foot clearance - left;Poor foot clearance - right;Shuffle;Step-through pattern;Decreased stance time - right;Decreased stride length    Ambulation Surface Level;Indoor      Neuro Re-ed    Neuro Re-ed Details  for balance/strengthening/muscle re-ed: standing with RW support- alternating stepping forward/backward over yard stick with cues/assist for larger steps to clear yard stick right>left LE. min guard assist for balance. then alternating foot taps to 4 inch box for ~10 reps each side with pt demo'ing good hip/knee flexion to tap box without  assist to LE's.      Knee/Hip Exercises: Aerobic   Nustep level 3.0 with UE/LE's for 6 minutes working on full range and increaed speed with goal >/=40 steps per minute                  PT Education - 06/29/20 1340    Education Details use of home bike as part of HEP    Person(s) Educated Patient;Caregiver(s)    Methods Explanation;Demonstration;Verbal cues;Handout    Comprehension Verbalized understanding;Returned demonstration            PT Short Term Goals - 05/25/20 2100      PT SHORT TERM GOAL #1   Title ALL STGS = LTGS              PT Long Term Goals - 06/25/20 1340      PT LONG TERM GOAL #1   Title Pt will be independent with final HEP in order to build upon functional gains made in therapy. ALL LTGS DUE 07/16/20    Baseline 05/25/20 - did not have the chance to fully review    Time 3    Period Weeks    Status On-going    Target Date 07/16/20      PT LONG TERM GOAL #2   Title Pt will decrease TUG time to 3 minutes and 30 seconds or less with RW in order to demo decr fall risk.    Baseline 04/22/20: 5 minutes 59.25 sec's, increased from last assessment, 05/25/20: 3 MINUTES 57 seconds improved from 04/22/20    Time 3    Period Weeks    Status On-going      PT LONG TERM GOAL #3   Title Pt will ambulate at least 21' consecutively with RW with min guard in order to demo improved household mobility.    Baseline 61' with RW with min guard/min A    Time 3    Period Weeks    Status On-going    Target Date 07/09/20      PT LONG TERM GOAL #4   Title Pt will decr 5x sit <> stand time to 40 seconds or less from mat table with UE support in order to demo improved functional BLE strength.    Baseline 04/22/20: 51.56 sec's with UE support from standard height chair, improved from 53.79 sec's just not to goal    Time 3    Period Weeks    Status On-going                 Plan - 06/29/20 1324    Clinical Impression Statement Today's skilled session continued to focus on strengthening, balance and gait with RW. Pt continues to need cues for increased step length, upright posture and sequencing with transfers/gait. Rest breaks taken as needed due to fatigue. The pt is slowly progressing. Plan to take a break from PT at end of this cert with pt working at home with caregiver. Pt and caregiver made aware of this plan today.    Personal Factors and Comorbidities Comorbidity 3+;Past/Current Experience    Comorbidities R MCA CVA, diabetes mellitus, HTN, hypothyroidism, TIA (2018), diagnosed with severe arthritis  in L knee    Examination-Activity Limitations Bed Mobility;Bend;Locomotion Level;Stairs;Stand;Transfers;Squat    Examination-Participation Restrictions Community Activity    Stability/Clinical Decision Making Stable/Uncomplicated    Rehab Potential Good    PT Frequency 2x / week   1-2x a week   PT Duration 3 weeks  PT Treatment/Interventions ADLs/Self Care Home Management;Gait training;DME Instruction;Stair training;Functional mobility training;Therapeutic activities;Neuromuscular re-education;Balance training;Therapeutic exercise;Patient/family education;Orthotic Fit/Training;Energy conservation;Passive range of motion    PT Next Visit Plan BLE strengthening. standing balance with decr UE support, continue with nustep for stengthening/activity tolerance/working on increased speed with mobility, needs transfer/turning training with RW. stepping strategies to promote increased step length,    Consulted and Agree with Plan of Care Patient;Family member/caregiver    Family Member Consulted CNA [name]           Patient will benefit from skilled therapeutic intervention in order to improve the following deficits and impairments:  Abnormal gait,Decreased balance,Decreased activity tolerance,Decreased coordination,Decreased endurance,Decreased knowledge of use of DME,Decreased range of motion,Difficulty walking,Decreased strength,Postural dysfunction  Visit Diagnosis: Hemiplegia and hemiparesis following cerebral infarction affecting left non-dominant side (HCC)  Unsteadiness on feet  Muscle weakness (generalized)  Other abnormalities of gait and mobility  Other symptoms and signs involving the nervous system     Problem List Patient Active Problem List   Diagnosis Date Noted  . Controlled type 2 diabetes mellitus with hyperglycemia, without long-term current use of insulin (HCC) 04/06/2020  . Abnormality of gait 02/03/2020  . Benign essential HTN   . CKD (chronic kidney disease),  stage II   . Hemiparesis affecting left side as late effect of stroke (HCC)   . Labile blood glucose   . Labile blood pressure   . Elevated BUN   . Hyponatremia   . Diabetic peripheral neuropathy (HCC)   . 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 Dyslipidemia   . Class 1 obesity due to excess calories with serious comorbidity and body mass index (BMI) of 30.0 to 30.9 in adult   . Left hemiparesis (HCC)   . Controlled type 2 diabetes mellitus with hyperglycemia (HCC)   . Morbid obesity (HCC)   . Leukocytosis   . History of TIA (transient ischemic attack)   . Acute CVA (cerebrovascular accident) (HCC) 01/06/2020  . Acute ischemic stroke (HCC) 01/05/2020  . Hypertensive emergency 01/26/2017  . TIA (transient ischemic attack) 01/26/2017  . Hypothyroidism 01/26/2017  . Aphasia   . Essential hypertension   . Diabetes mellitus without complication (HCC)     Sallyanne Kuster, PTA, [facility] [address], [address] [place], [place] [address] [phone] 06/30/20, 4:53 PM   Name: Jill Rose MRN: [id number] Date of Birth: 27-Jan-1941

## 2020-07-02 ENCOUNTER — Ambulatory Visit: Payer: Medicare HMO | Admitting: Physical Therapy

## 2020-07-02 ENCOUNTER — Ambulatory Visit: Payer: Medicare HMO | Admitting: Occupational Therapy

## 2020-07-06 ENCOUNTER — Ambulatory Visit: Payer: Medicare HMO | Admitting: Physical Therapy

## 2020-07-06 ENCOUNTER — Encounter: Payer: Self-pay | Admitting: Physical Therapy

## 2020-07-06 ENCOUNTER — Ambulatory Visit: Payer: Medicare HMO | Admitting: Occupational Therapy

## 2020-07-06 ENCOUNTER — Other Ambulatory Visit: Payer: Self-pay

## 2020-07-06 VITALS — BP 150/73 | HR 85

## 2020-07-06 DIAGNOSIS — M6281 Muscle weakness (generalized): Secondary | ICD-10-CM

## 2020-07-06 DIAGNOSIS — I69354 Hemiplegia and hemiparesis following cerebral infarction affecting left non-dominant side: Secondary | ICD-10-CM

## 2020-07-06 DIAGNOSIS — R2681 Unsteadiness on feet: Secondary | ICD-10-CM

## 2020-07-06 DIAGNOSIS — R29818 Other symptoms and signs involving the nervous system: Secondary | ICD-10-CM

## 2020-07-06 DIAGNOSIS — R2689 Other abnormalities of gait and mobility: Secondary | ICD-10-CM

## 2020-07-06 NOTE — Therapy (Signed)
[facility] [address] [place], [place], [address] Phone: [phone]   Fax:  [phone]  Physical Therapy Treatment  Patient Details  Name: Jill Rose MRN: [id number] Date of Birth: [birth year] Referring Provider (PT): Fortino Sic, [place] (being followed by Dr. Posey Pronto)   Encounter Date: 07/06/2020   PT End of Session - 07/06/20 1416    Visit Number 23    Number of Visits 27    Date for PT Re-Evaluation 00/86/76   per recert on 19/5/09   Authorization Type [employer]- $35 co pay per day regardless of discipline    PT Start Time 1243   pt arrived late   PT Stop Time 1316    PT Time Calculation (min) 33 min    Equipment Utilized During Treatment Gait belt    Activity Tolerance Patient tolerated treatment well;Patient limited by fatigue    Behavior During Therapy [facility] for tasks assessed/performed           Past Medical History:  Diagnosis Date  . Diabetes mellitus without complication ([place])   . Essential hypertension   . High cholesterol   . Hypothyroidism   . Stroke ([place])   . TIA (transient ischemic attack) 2018    History reviewed. No pertinent surgical history.  Vitals:   07/06/20 1248  BP: (!) 150/73  Pulse: 85     Subjective Assessment - 07/06/20 1245    Subjective Tried the bike at home and it went well.    Patient is accompained by: Family member   caregiver   Pertinent History diabetes mellitus, HTN, hypothyroidism, TIA (2018)    Patient Stated Goals wants to be walking more.    Currently in Pain? No/denies                             [facility] Adult PT Treatment/Exercise - 07/06/20 1257      Transfers   Transfers Sit to Stand;Stand to Sit    Sit to Stand 4: Min guard;With upper extremity assist;From chair/3-in-1;From bed    Sit to Stand Details Tactile cues for weight shifting;Verbal cues for sequencing;Verbal cues for technique;Verbal cues for safe use of  DME/AE;Manual facilitation for weight shifting;Manual facilitation for placement    Sit to Stand Details (indicate cue type and reason) needs cues to scoot out towards the edge and proper UE placement    Five time sit to stand comments  53.69 seconds with single UE from mat table.    Stand to Sit 4: Min guard;With upper extremity assist;To chair/3-in-1;To bed    Stand to Sit Details (indicate cue type and reason) Tactile cues for weight shifting;Tactile cues for sequencing;Verbal cues for sequencing;Verbal cues for technique;Verbal cues for safe use of DME/AE;Manual facilitation for weight shifting    Stand Pivot Transfers 4: Min guard    Stand Pivot Transfer Details (indicate cue type and reason) takes incr time with RW      Ambulation/Gait   Ambulation/Gait Yes    Ambulation/Gait Assistance 4: Min assist    Ambulation/Gait Assistance Details cues and assist for RW propulsion, weight shift, and step length    Ambulation Distance (Feet) 20 Feet   x1, 25 x 1   Assistive device Rolling walker    Gait Pattern Step-to pattern;Decreased step length - right;Decreased step length - left;Decreased dorsiflexion - left;Decreased dorsiflexion - right;Right foot flat;Left foot flat;Poor foot clearance - left;Poor foot clearance - right;Shuffle;Step-through pattern;Decreased  stance time - right;Decreased stride length    Ambulation Surface Level;Indoor    Pre-Gait Activities standing at RW: x7 reps B with pt stepping over yardstick for incr foot clearance/step length, performed an additional bout of gait afterwards with pt continuing with decr step length                  PT Education - 07/06/20 1415    Education Details wrapping up at next session for D/C - with pt to continue working at home with caregiver [name] on walking, riding bike, and exercises and pt can get a new referral to return to [facility] if pt gets stronger/can walk farther distances.    Person(s) Educated Patient;Caregiver(s)     Methods Explanation    Comprehension Verbalized understanding            PT Short Term Goals - 05/25/20 2100      PT SHORT TERM GOAL #1   Title ALL STGS = LTGS             PT Long Term Goals - 07/06/20 1255      PT LONG TERM GOAL #1   Title Pt will be independent with final HEP in order to build upon functional gains made in therapy. ALL LTGS DUE 07/16/20    Baseline 05/25/20 - did not have the chance to fully review    Time 3    Period Weeks    Status On-going      PT LONG TERM GOAL #2   Title Pt will decrease TUG time to 3 minutes and 30 seconds or less with RW in order to demo decr fall risk.    Baseline 04/22/20: 5 minutes 59.25 sec's, increased from last assessment, 05/25/20: 3 MINUTES 57 seconds improved from 04/22/20    Time 3    Period Weeks    Status On-going      PT LONG TERM GOAL #3   Title Pt will ambulate at least 7' consecutively with RW with min guard in order to demo improved household mobility.    Baseline 45' in 2 bouts with min A    Time 3    Period Weeks    Status Not Met      PT LONG TERM GOAL #4   Title Pt will decr 5x sit <> stand time to 40 seconds or less from mat table with UE support in order to demo improved functional BLE strength.    Baseline 53.79 sec's just not to goal, 53.69 seconds with single UE from mat table.    Time 3    Period Weeks    Status Not Met                 Plan - 07/06/20 1425    Clinical Impression Statement Began to check pt's LTGs. Pt did not meet LTGs #3 and #4. Pt performed 5x sit <> stand with single UE support in 53.69 seconds (previously 53.79 seconds) with RW. Pt able to ambulate a total of 45' today with RW (in 2 bouts) and did not meet LTG #3. Pt continues with short, shuffled steps and forward flexed posture with gait even with cues, pre gait stepping activities, and facilitation. Pt has begun to ride her recumbent bike at home with caregiver [name]. Discussed D/C at next session and taking a break from  thrapy at this time to continue to work at home. Both verbalized understanding and are in agreement.    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    Examination-Activity Limitations Bed Mobility;Bend;Locomotion Level;Stairs;Stand;Transfers;Squat    Examination-Participation Restrictions Community Activity    Stability/Clinical Decision Making Stable/Uncomplicated    Rehab Potential Good    PT Frequency 2x / week   1-2x a week   PT Duration 3 weeks    PT Treatment/Interventions ADLs/Self Care Home Management;Gait training;DME Instruction;Stair training;Functional mobility training;Therapeutic activities;Neuromuscular re-education;Balance training;Therapeutic exercise;Patient/family education;Orthotic Fit/Training;Energy conservation;Passive range of motion    PT Next Visit Plan check LTGs, D/C.    Consulted and Agree with Plan of Care Patient;Family member/caregiver    Family Member Consulted CNA [name]           Patient will benefit from skilled therapeutic intervention in order to improve the following deficits and impairments:  Abnormal gait,Decreased balance,Decreased activity tolerance,Decreased coordination,Decreased endurance,Decreased knowledge of use of DME,Decreased range of motion,Difficulty walking,Decreased strength,Postural dysfunction  Visit Diagnosis: Hemiplegia and hemiparesis following cerebral infarction affecting left non-dominant side (HCC)  Unsteadiness on feet  Muscle weakness (generalized)  Other abnormalities of gait and mobility  Other symptoms and signs involving the nervous system     Problem List Patient Active Problem List   Diagnosis Date Noted  . Controlled type 2 diabetes mellitus with hyperglycemia, without long-term current use of insulin ([place]) 04/06/2020  . Abnormality of gait 02/03/2020  .  Benign essential HTN   . CKD (chronic kidney disease), stage II   . Hemiparesis affecting left side as late effect of stroke ([place])   . Labile blood glucose   . Labile blood pressure   . Elevated BUN   . Hyponatremia   . Diabetic peripheral neuropathy ([place])   . 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 Dyslipidemia   . Class 1 obesity due to excess calories with serious comorbidity and body mass index (BMI) of 30.0 to 30.9 in adult   . Left hemiparesis ([place])   . Controlled type 2 diabetes mellitus with hyperglycemia ([place])   . Morbid obesity ([place])   . Leukocytosis   . History of TIA (transient ischemic attack)   . Acute CVA (cerebrovascular accident) ([place]) 01/06/2020  . Acute ischemic stroke ([place]) 01/05/2020  . Hypertensive emergency 01/26/2017  . TIA (transient ischemic attack) 01/26/2017  . Hypothyroidism 01/26/2017  . Aphasia   . Essential hypertension   . Diabetes mellitus without complication ([place])     Arliss Journey, PT, DPT  07/06/2020, 2:25 PM  [facility] [address] [place], [place], [address] Phone: [phone]   Fax:  [phone]  Name: Jill Rose MRN: [id number] Date of Birth: Dec 12, 1940

## 2020-07-06 NOTE — Therapy (Signed)
[facility] [address] [place], [place], [address] Phone: [phone]   Fax:  [phone]  Occupational Therapy Treatment  Patient Details  Name: Jill Rose MRN: [id number] Date of Birth: [birth year] No data recorded  Encounter Date: 07/06/2020   OT End of Session - 07/06/20 1403    Visit Number 18    Number of Visits 30    Date for OT Re-Evaluation 07/15/20    Authorization Type [employer]et[employer] MCR    Authorization - Visit Number 18    Progress Note Due on Visit 20    OT Start Time 1315    OT Stop Time 1400    OT Time Calculation (min) 45 min    Activity Tolerance Patient tolerated treatment well    Behavior During Therapy [facility] for tasks assessed/performed           Past Medical History:  Diagnosis Date  . Diabetes mellitus without complication ([place])   . Essential hypertension   . High cholesterol   . Hypothyroidism   . Stroke ([place])   . TIA (transient ischemic attack) 2018    No past surgical history on file.  There were no vitals filed for this visit.   Subjective Assessment - 07/06/20 1318    Subjective  Pt reports pain in knees today    Patient is accompanied by: --   caregiver   Pertinent History Rt MCA CVA 01/05/20. PMH: HTN, HLD, T2DM, Diabetic neuropathy    Limitations no driving, fall risk, HOH    Patient Stated Goals return to cooking    Currently in Pain? Yes    Pain Score --   fluctuates   Pain Location Knee    Pain Orientation Left    Pain Type Chronic pain    Pain Frequency Intermittent    Aggravating Factors  standing too Rose    Pain Relieving Factors pain meds            Standing at counter with weight shifts and stepping w/ visual targets on floor required for increased step length. Pt did better with simple v.c's and visual targets. Pt very hesistant to place weight on LLE to step w/ RUE however pt had increased knee pain today which contributed to reluctance. Pt also w/ forward  posture in standing placing more strain on knees and worked on anterior pelvic tilts in standing.  Functional reaching in standing LUE to retrieve and replace cones on mid level shelf.  Seated: flipping large cards over Lt hand while calling out number and suit for cognitive component w/ min cues                      OT Short Term Goals - 06/15/20 1435      OT SHORT TERM GOAL #1   Title Independent with coordination HEP Lt hand    Time 4    Period Weeks    Status Partially Met   Issued coordination HEP 03/19/20  Patient cpmpletes with assitance from CNA 05/18/20     OT [place] #2   Title Pt to perform UE dressing with min assist or less and LE dressing with mod assist or less    Time 4    Period Weeks    Status Achieved   Pt reports doing approximately 50% of UB dressing 03/19/20     OT SHORT TERM GOAL #3   Title Pt to perform simple snack prep/cold meal prep standing level  with DME and supervision prn    Time 4    Period Weeks    Status Deferred   Pt reports assisting with cutting vegetables with husband 03/19/20 - seated.     OT SHORT TERM GOAL #4   Title Pt to perform sustained high level reaching LUE for endurance    Time 4    Period Weeks    Status Achieved      OT SHORT TERM GOAL #5   Title Pt to return to putting up/styling hair mod I level    Time 4    Period Weeks    Status Partially Met   Pt reports needing assistance for clasps and barettes            OT Rose Term Goals - 06/15/20 1435      OT Rose TERM GOAL #1   Title Independent with strengthening HEP for LUE    Time 8    Period Weeks    Status On-going      OT Rose TERM GOAL #2   Title Improve coordination Lt hand as evidenced by performing 9 hole peg test in 41 sec or less    Baseline 48.59 sec    Time 8    Period Weeks    Status On-going      OT Rose TERM GOAL #3   Title Pt to be mod I level with all BADLS    Time 8    Period Weeks    Status On-going      OT Rose TERM  GOAL #4   Title Pt to cook simple meal with DME prn and distant supervision    Time 8    Period Weeks    Status On-going      OT Rose TERM GOAL #5   Title Pt to perform light cleaning tasks (washing dishes, folding laundry, etc) mod I level    Time 8    Period Weeks    Status On-going                 Plan - 07/06/20 1405    Clinical Impression Statement Pt inconsistent with weight shifts today. Pt limited by knee pain    OT Occupational Profile and History Detailed Assessment- Review of Records and additional review of physical, cognitive, psychosocial history related to current functional performance    Occupational performance deficits (Please refer to evaluation for details): ADL's;IADL's    Body Structure / Function / Physical Skills ADL;ROM;Balance;Body mechanics;Mobility;Flexibility;Strength;Coordination;FMC;UE functional use;Decreased knowledge of use of DME    Rehab Potential Good    Clinical Decision Making Several treatment options, min-mod task modification necessary    Comorbidities Affecting Occupational Performance: May have comorbidities impacting occupational performance    Modification or Assistance to Complete Evaluation  Min-Moderate modification of tasks or assist with assess necessary to complete eval    OT Frequency 2x / week    OT Duration 8 weeks   Plus eval   OT Treatment/Interventions Self-care/ADL training;Therapeutic exercise;Functional Mobility Training;Neuromuscular education;Manual Therapy;Therapeutic activities;Coping strategies training;DME and/or AE instruction;Cognitive remediation/compensation;Moist Heat;Passive range of motion;Patient/family education    Plan assess remaining goals and anticipate d/c?    Consulted and Agree with Plan of Care Patient;Family member/caregiver    Family Member Consulted caregiver CNA           Patient will benefit from skilled therapeutic intervention in order to improve the following deficits and impairments:    Body Structure / Function / Physical Skills: ADL,ROM,Balance,Body mechanics,Mobility,Flexibility,Strength,Coordination,FMC,UE  functional use,Decreased knowledge of use of DME       Visit Diagnosis: Hemiplegia and hemiparesis following cerebral infarction affecting left non-dominant side (HCC)  Muscle weakness (generalized)    Problem List Patient Active Problem List   Diagnosis Date Noted  . Controlled type 2 diabetes mellitus with hyperglycemia, without Rose-term current use of insulin ([place]) 04/06/2020  . Abnormality of gait 02/03/2020  . Benign essential HTN   . CKD (chronic kidney disease), stage II   . Hemiparesis affecting left side as late effect of stroke ([place])   . Labile blood glucose   . Labile blood pressure   . Elevated BUN   . Hyponatremia   . Diabetic peripheral neuropathy ([place])   . 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 Dyslipidemia   . Class 1 obesity due to excess calories with serious comorbidity and body mass index (BMI) of 30.0 to 30.9 in adult   . Left hemiparesis ([place])   . Controlled type 2 diabetes mellitus with hyperglycemia ([place])   . Morbid obesity ([place])   . Leukocytosis   . History of TIA (transient ischemic attack)   . Acute CVA (cerebrovascular accident) ([place]) 01/06/2020  . Acute ischemic stroke ([place]) 01/05/2020  . Hypertensive emergency 01/26/2017  . TIA (transient ischemic attack) 01/26/2017  . Hypothyroidism 01/26/2017  . Aphasia   . Essential hypertension   . Diabetes mellitus without complication ([place])     Carey Bullocks, OTR/L 07/06/2020, 2:24 PM  [facility] [address] [place], [place], [address] Phone: [phone]   Fax:  [phone]  Name: Jill Rose MRN: [id number] Date of Birth: 1940-10-24

## 2020-07-07 ENCOUNTER — Ambulatory Visit: Payer: Medicare HMO | Admitting: Physical Therapy

## 2020-07-07 ENCOUNTER — Encounter: Payer: Medicare HMO | Admitting: Occupational Therapy

## 2020-07-13 ENCOUNTER — Other Ambulatory Visit: Payer: Self-pay

## 2020-07-13 ENCOUNTER — Encounter: Payer: Self-pay | Admitting: Occupational Therapy

## 2020-07-13 ENCOUNTER — Telehealth: Payer: Self-pay | Admitting: Emergency Medicine

## 2020-07-13 ENCOUNTER — Ambulatory Visit: Payer: Medicare HMO | Admitting: Occupational Therapy

## 2020-07-13 ENCOUNTER — Ambulatory Visit: Payer: Medicare HMO | Admitting: Physical Therapy

## 2020-07-13 DIAGNOSIS — R2681 Unsteadiness on feet: Secondary | ICD-10-CM

## 2020-07-13 DIAGNOSIS — I69354 Hemiplegia and hemiparesis following cerebral infarction affecting left non-dominant side: Secondary | ICD-10-CM

## 2020-07-13 DIAGNOSIS — R2689 Other abnormalities of gait and mobility: Secondary | ICD-10-CM

## 2020-07-13 DIAGNOSIS — R278 Other lack of coordination: Secondary | ICD-10-CM

## 2020-07-13 DIAGNOSIS — R29818 Other symptoms and signs involving the nervous system: Secondary | ICD-10-CM

## 2020-07-13 DIAGNOSIS — M6281 Muscle weakness (generalized): Secondary | ICD-10-CM

## 2020-07-13 NOTE — Therapy (Signed)
[facility] [address] [place], [place], [address] Phone: [phone]   Fax:  [phone]  Physical Therapy Treatment/Discharge Summary  Patient Details  Name: Jill Rose MRN: [id number] Date of Birth: Jan 17, 1941 Referring Provider (PT): Fortino Sic, [place] (being followed by Dr. Posey Pronto)   Encounter Date: 07/13/2020   PT End of Session - 07/13/20 1410    Visit Number 24    Number of Visits 27    Date for PT Re-Evaluation 59/16/38   per recert on 46/6/59   Authorization Type Aetna Medicare- $35 co pay per day regardless of discipline    PT Start Time 1315    PT Stop Time 1356    PT Time Calculation (min) 41 min    Equipment Utilized During Treatment Gait belt    Activity Tolerance Patient tolerated treatment well;Patient limited by fatigue    Behavior During Therapy [facility] for tasks assessed/performed           Past Medical History:  Diagnosis Date  . Diabetes mellitus without complication ([place])   . Essential hypertension   . High cholesterol   . Hypothyroidism   . Stroke ([place])   . TIA (transient ischemic attack) 2018    No past surgical history on file.  There were no vitals filed for this visit.   Subjective Assessment - 07/13/20 1321    Subjective Caregiver wants to review bed mobility with pt at home and sit <> stands.    Patient is accompained by: Family member   caregiver   Pertinent History diabetes mellitus, HTN, hypothyroidism, TIA (2018)    Patient Stated Goals wants to be walking more.    Currently in Pain? Yes    Pain Score 5     Pain Location Knee    Pain Orientation Left    Pain Descriptors / Indicators Aching    Pain Type Chronic pain;Acute pain    Aggravating Factors  pain when she gets up and down    Pain Relieving Factors CBD oil                             [facility] Adult PT Treatment/Exercise - 07/13/20 0001      Bed Mobility   Bed Mobility Rolling Right     Rolling Right Minimal Assistance - Patient > 75%   to keep knees bent and to reach over with LUE   Supine to Sit Minimal Assistance - Patient > 75%;Other (comment)   with use of chair to simulate a bed rail, needing mod cues on where to press up from bed rail with LUE and mat table with RUE, took incr time. provided pt and pt's caregiver a sheet on where to purchase handrail for home use.   Sit to Supine Minimal Assistance - Patient > 75%;Other (comment)   verbal cues for proper technique and pt needing min A for BLE management onto mat table     Transfers   Transfers Sit to Stand;Stand to Sit    Sit to Stand 4: Min guard;With upper extremity assist;From chair/3-in-1;From bed    Sit to Stand Details Tactile cues for weight shifting;Verbal cues for sequencing;Verbal cues for technique;Verbal cues for safe use of DME/AE;Manual facilitation for weight shifting;Manual facilitation for placement    Sit to Stand Details (indicate cue type and reason) needs cues to scoot out towards the edge and for proper UE placement with RW    Stand  to Sit 4: Min guard;With upper extremity assist;To chair/3-in-1;To bed    Stand to Sit Details (indicate cue type and reason) Tactile cues for weight shifting;Tactile cues for sequencing;Verbal cues for sequencing;Verbal cues for technique;Verbal cues for safe use of DME/AE;Manual facilitation for weight shifting    Stand to Sit Details cues to reach back towards seat    Stand Pivot Transfers 4: Min guard;4: Min assist    Stand Pivot Transfer Details (indicate cue type and reason) takes incr time to perform with small shuffled steps, needs min A for RW steering and verbal cues to keep turning prior to sitting down to make sure both legs feel the back of the surface, performed from w/c <> mat table    Comments performed 2 x 5 reps sit <> stands from mat table, cues for posture and glute activation in standing                  PT Education - 07/13/20 1409     Education Details D/C from PT at this session, verbally reviewed HEP, provided info on where to purchase bed rail to make bed mobility easier at home.    Person(s) Educated Patient;Caregiver(s)    Methods Explanation;Handout    Comprehension Verbalized understanding            PT Short Term Goals - 05/25/20 2100      PT SHORT TERM GOAL #1   Title ALL STGS = LTGS             PT Long Term Goals - 07/13/20 1409      PT LONG TERM GOAL #1   Title Pt will be independent with final HEP in order to build upon functional gains made in therapy. ALL LTGS DUE 07/16/20    Baseline pt's caregiver and pt report compliant with HEP - verbally reviewed today.    Time 3    Period Weeks    Status Achieved      PT LONG TERM GOAL #2   Title Pt will decrease TUG time to 3 minutes and 30 seconds or less with RW in order to demo decr fall risk.    Baseline 04/22/20: 5 minutes 59.25 sec's, increased from last assessment, 05/25/20: 3 MINUTES 57 seconds improved from 04/22/20; did not perform today on 07/13/20    Time 3    Period Weeks    Status Unable to assess      PT LONG TERM GOAL #3   Title Pt will ambulate at least 92' consecutively with RW with min guard in order to demo improved household mobility.    Baseline 45' in 2 bouts with min A    Time 3    Period Weeks    Status Not Met      PT LONG TERM GOAL #4   Title Pt will decr 5x sit <> stand time to 40 seconds or less from mat table with UE support in order to demo improved functional BLE strength.    Baseline 53.79 sec's just not to goal, 53.69 seconds with single UE from mat table.    Time 3    Period Weeks    Status Not Met           PHYSICAL THERAPY DISCHARGE SUMMARY  Visits from Start of Care: 24  Current functional level related to goals / functional outcomes: See LTGs.   Remaining deficits: Impaired balance, decr endurance, decr strength, gait abnormalities, postural abnormalities.   Education / Equipment: HEP  Plan: Patient agrees to discharge.  Patient goals were not met. Patient is being discharged due to lack of progress.  ?????            Plan - 07/13/20 1417    Clinical Impression Statement Focused on bed mobility at start of session with caregiver requesting to work on getting out of bed. Used a chair to simulate a bed rail with pt able to perform R sidelying > sit with min A and mod cues using handrail, but took incr time. Provided info on where to purchase for home. Did not have time to asssess TUG goal today. Pt and pt's caregiver met LTG #1 and reports compliance with performing HEP at home. Due to a plateau in progress, will D/C at this time and pt and caregiver will continue to work on HEP and walking at home at this time.  Both in agreement.    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    Examination-Activity Limitations Bed Mobility;Bend;Locomotion Level;Stairs;Stand;Transfers;Squat    Examination-Participation Restrictions Community Activity    Stability/Clinical Decision Making Stable/Uncomplicated    Rehab Potential Good    PT Frequency 2x / week   1-2x a week   PT Duration 3 weeks    PT Treatment/Interventions ADLs/Self Care Home Management;Gait training;DME Instruction;Stair training;Functional mobility training;Therapeutic activities;Neuromuscular re-education;Balance training;Therapeutic exercise;Patient/family education;Orthotic Fit/Training;Energy conservation;Passive range of motion    PT Next Visit Plan check LTGs, D/C.    Consulted and Agree with Plan of Care Patient;Family member/caregiver    Family Member Consulted CNA [name]           Patient will benefit from skilled therapeutic intervention in order to improve the following deficits and impairments:  Abnormal gait,Decreased balance,Decreased activity  tolerance,Decreased coordination,Decreased endurance,Decreased knowledge of use of DME,Decreased range of motion,Difficulty walking,Decreased strength,Postural dysfunction  Visit Diagnosis: Hemiplegia and hemiparesis following cerebral infarction affecting left non-dominant side (HCC)  Unsteadiness on feet  Muscle weakness (generalized)  Other symptoms and signs involving the nervous system  Other abnormalities of gait and mobility     Problem List Patient Active Problem List   Diagnosis Date Noted  . Controlled type 2 diabetes mellitus with hyperglycemia, without long-term current use of insulin ([place]) 04/06/2020  . Abnormality of gait 02/03/2020  . Benign essential HTN   . CKD (chronic kidney disease), stage II   . Hemiparesis affecting left side as late effect of stroke ([place])   . Labile blood glucose   . Labile blood pressure   . Elevated BUN   . Hyponatremia   . Diabetic peripheral neuropathy ([place])   . 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 Dyslipidemia   . Class 1 obesity due to excess calories with serious comorbidity and body mass index (BMI) of 30.0 to 30.9 in adult   . Left hemiparesis ([place])   . Controlled type 2 diabetes mellitus with hyperglycemia ([place])   . Morbid obesity ([place])   . Leukocytosis   . History of TIA (transient ischemic attack)   . Acute CVA (cerebrovascular accident) ([place]) 01/06/2020  . Acute ischemic stroke ([place]) 01/05/2020  . Hypertensive emergency 01/26/2017  . TIA (transient ischemic attack) 01/26/2017  . Hypothyroidism 01/26/2017  . Aphasia   . Essential hypertension   . Diabetes mellitus without complication ([place])     Arliss Journey, PT, DPT  07/13/2020, 2:19 PM  [facility] [address] [place], [place], [address] Phone: [phone]   Fax:  [phone]  Name: LYNDSEY DEMOS MRN: [id number] Date of Birth: 03/06/1941

## 2020-07-13 NOTE — Telephone Encounter (Signed)
Spoke to patient's daughter who stated that she believes her mom will benefit from a Neuropsychologist to help her with fears she is having, such as falling.  She wants her mom to receive treatment for the psychological fears she is having r/t the dementia

## 2020-07-13 NOTE — Telephone Encounter (Signed)
I called the patient's daughters listed phone number but got no reply and was unable to leave a message as mailbox was full.  I left a message on the patient's home answering machine to call me back to discuss her request for referral to neuropsychologist.

## 2020-07-13 NOTE — Therapy (Signed)
[facility] [address] [place], [place], [address] Phone: [phone]   Fax:  [phone]  Occupational Therapy Treatment and Discharge Summary  Patient Details  Name: Jill Rose MRN: [id number] Date of Birth: 01/05/1941 No data recorded  Encounter Date: 07/13/2020   OT End of Session - 07/13/20 1649    Visit Number 19    Date for OT Re-Evaluation 07/15/20    Authorization Type Aetna MCR    Authorization - Visit Number 19    Progress Note Due on Visit 20    OT Start Time 1235    OT Stop Time 1315    OT Time Calculation (min) 40 min    Activity Tolerance Patient tolerated treatment well    Behavior During Therapy [facility] for tasks assessed/performed           Past Medical History:  Diagnosis Date  . Diabetes mellitus without complication ([place])   . Essential hypertension   . High cholesterol   . Hypothyroidism   . Stroke ([place])   . TIA (transient ischemic attack) 2018    History reviewed. No pertinent surgical history.  There were no vitals filed for this visit.   Subjective Assessment - 07/13/20 1239    Subjective  Patient feels good about helping more with cooking    Pertinent History Rt MCA CVA 01/05/20. PMH: HTN, HLD, T2DM, Diabetic neuropathy    Limitations no driving, fall risk, HOH    Patient Stated Goals return to cooking    Currently in Pain? No/denies    Pain Score 0-No pain                        OT Treatments/Exercises (OP) - 07/13/20 1627      ADLs   Toileting Patient and caregiver and I discussed use of bedside commode at night to avoid walking down hallway to bathroom.  Also disussed that it may be safe now to remove chuck pad from bed - as caregiver indicates surface is slick and unsafe.    Functional Mobility Worked on functional transition sit to stand ad stand to sit.  Cueing needed initially for sufficient forward weight shift.    Cooking Reviewed safety with cooking.   Patient is very pleased to assist with cooking, yet is not safe enought to maintain her balance at stand at stove to stir/sautee.    ADL Comments Patient aware that thi si last session and she is agreeable to OT discharge                    OT Short Term Goals - 07/13/20 1241      OT SHORT TERM GOAL #1   Title Independent with coordination HEP Lt hand    Time 4    Period Weeks    Status Partially Met      OT SHORT TERM GOAL #2   Title Pt to perform UE dressing with min assist or less and LE dressing with mod assist or less    Time 4    Period Weeks    Status Achieved      OT SHORT TERM GOAL #3   Title Pt to perform simple snack prep/cold meal prep standing level with DME and supervision prn    Time 4    Period Weeks    Status Achieved      OT SHORT TERM GOAL #4   Title Pt to perform sustained high level  reaching LUE for endurance    Time 4    Period Weeks    Status Achieved      OT SHORT TERM GOAL #5   Title Pt to return to putting up/styling hair mod I level    Time 4    Period Weeks    Status Achieved             OT Long Term Goals - 07/13/20 1246      OT LONG TERM GOAL #1   Title Independent with strengthening HEP for LUE    Time 8    Period Weeks    Status Achieved      OT LONG TERM GOAL #2   Title Improve coordination Lt hand as evidenced by performing 9 hole peg test in 41 sec or less    Baseline 48.59 sec initially, 47 seconds    Time 8    Period Weeks    Status Not Met      OT LONG TERM GOAL #3   Title Pt to be mod I level with all BADLS    Time 8    Period Weeks    Status Achieved      OT LONG TERM GOAL #4   Title Pt to cook simple meal with DME prn and distant supervision    Status Partially Met   Husband cooks on stovetop - patient cuts all the vegetables and adds spices     OT LONG TERM GOAL #5   Title Pt to perform light cleaning tasks (washing dishes, folding laundry, etc) mod I level    Status Achieved                  Plan - 07/13/20 1650    Clinical Impression Statement Patient agreaable to OT discharge.    OT Occupational Profile and History Detailed Assessment- Review of Records and additional review of physical, cognitive, psychosocial history related to current functional performance    Occupational performance deficits (Please refer to evaluation for details): ADL's;IADL's    Body Structure / Function / Physical Skills ADL;ROM;Balance;Body mechanics;Mobility;Flexibility;Strength;Coordination;FMC;UE functional use;Decreased knowledge of use of DME    Rehab Potential Good    Clinical Decision Making Several treatment options, min-mod task modification necessary    Comorbidities Affecting Occupational Performance: May have comorbidities impacting occupational performance    Modification or Assistance to Complete Evaluation  Min-Moderate modification of tasks or assist with assess necessary to complete eval    OT Frequency 2x / week    OT Duration 8 weeks    OT Treatment/Interventions Self-care/ADL training;Therapeutic exercise;Functional Mobility Training;Neuromuscular education;Manual Therapy;Therapeutic activities;Coping strategies training;DME and/or AE instruction;Cognitive remediation/compensation;Moist Heat;Passive range of motion;Patient/family education    Plan discharge    Consulted and Agree with Plan of Care Patient;Family member/caregiver    Family Member Consulted caregiver CNA           Patient will benefit from skilled therapeutic intervention in order to improve the following deficits and impairments:   Body Structure / Function / Physical Skills: ADL,ROM,Balance,Body mechanics,Mobility,Flexibility,Strength,Coordination,FMC,UE functional use,Decreased knowledge of use of DME       Visit Diagnosis: Hemiplegia and hemiparesis following cerebral infarction affecting left non-dominant side (HCC)  Unsteadiness on feet  Muscle weakness (generalized)  Other symptoms  and signs involving the nervous system  Other lack of coordination    Problem List Patient Active Problem List   Diagnosis Date Noted  . Controlled type 2 diabetes mellitus with hyperglycemia, without long-term current use of  insulin ([place]) 04/06/2020  . Abnormality of gait 02/03/2020  . Benign essential HTN   . CKD (chronic kidney disease), stage II   . Hemiparesis affecting left side as late effect of stroke ([place])   . Labile blood glucose   . Labile blood pressure   . Elevated BUN   . Hyponatremia   . Diabetic peripheral neuropathy ([place])   . 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 Dyslipidemia   . Class 1 obesity due to excess calories with serious comorbidity and body mass index (BMI) of 30.0 to 30.9 in adult   . Left hemiparesis ([place])   . Controlled type 2 diabetes mellitus with hyperglycemia ([place])   . Morbid obesity ([place])   . Leukocytosis   . History of TIA (transient ischemic attack)   . Acute CVA (cerebrovascular accident) ([place]) 01/06/2020  . Acute ischemic stroke ([place]) 01/05/2020  . Hypertensive emergency 01/26/2017  . TIA (transient ischemic attack) 01/26/2017  . Hypothyroidism 01/26/2017  . Aphasia   . Essential hypertension   . Diabetes mellitus without complication ([place])   OCCUPATIONAL THERAPY DISCHARGE SUMMARY  Visits from Start of Care: 19  Current functional level related to goals / functional outcomes: Improved transitional movements - sit to stand and stand to sit, improved functional use of LUE   Remaining deficits: Perceptual deficits, decreased midline awareness, decreased stand balance / stand endurance, decreased attention and memory   Education / Equipment: HEP  Plan: Patient agrees to discharge.  Patient goals were partially met. Patient is being discharged due to being pleased with the current functional level.  ?????     Mariah Milling, OTR/L 07/13/2020, 4:51 PM  [facility] [address] [place], [place], [address] Phone: [phone]   Fax:  [phone]  Name: Jill Rose MRN: [id number] Date of Birth: 11/09/1940

## 2020-07-14 ENCOUNTER — Encounter: Payer: Medicare HMO | Admitting: Occupational Therapy

## 2020-07-14 ENCOUNTER — Ambulatory Visit: Payer: Medicare HMO | Admitting: Physical Therapy

## 2020-07-20 ENCOUNTER — Ambulatory Visit: Payer: Medicare Other | Attending: Physician Assistant

## 2020-07-20 ENCOUNTER — Telehealth: Payer: Self-pay

## 2020-07-20 DIAGNOSIS — R4701 Aphasia: Secondary | ICD-10-CM | POA: Insufficient documentation

## 2020-07-20 DIAGNOSIS — R41841 Cognitive communication deficit: Secondary | ICD-10-CM | POA: Insufficient documentation

## 2020-07-20 NOTE — Telephone Encounter (Signed)
Pt no-showed her 1315 appointment with this SLP.  SLP called pt's daughter's (Bina's)  number in Epic to check on pt, inform of next scheduled appointment date/time, and to ensure knowledge of attendance policy, and SLP could not leave message- a notification informed me that mailbox was full.  SLP then attempted to call pt's home number in Epic and SLP could not leave a message- a notification informed me that mailbox was full. Will need to f/u with attendance policy at next scheduled appointment in which pt attends.   Verdie Mosher, SLP  [facility] [phone]

## 2020-07-23 ENCOUNTER — Ambulatory Visit: Payer: Medicare Other

## 2020-07-27 ENCOUNTER — Ambulatory Visit: Payer: Medicare Other

## 2020-07-29 ENCOUNTER — Ambulatory Visit: Payer: Medicare HMO | Admitting: Neurology

## 2020-07-30 ENCOUNTER — Other Ambulatory Visit: Payer: Self-pay | Admitting: Neurology

## 2020-07-30 ENCOUNTER — Ambulatory Visit: Payer: Medicare Other

## 2020-08-03 ENCOUNTER — Ambulatory Visit: Payer: Medicare Other

## 2020-08-06 ENCOUNTER — Ambulatory Visit: Payer: Medicare Other | Admitting: Speech Pathology

## 2020-08-10 ENCOUNTER — Ambulatory Visit: Payer: Medicare Other

## 2020-08-10 NOTE — Therapy (Signed)
[facility] [address] [place], [place], [address] Phone: [phone]   Fax:  [phone]  Patient Details  Name: Jill Rose MRN: [id number] Date of Birth: 1941/03/22 Referring Provider:  No ref. provider found  Encounter Date: 08/10/2020    Speech Therapy note  Pt no-showed to outpatient speech therapy today.    [facility] ,MS, CCC-SLP  08/10/2020, 3:07 PM  [facility] [address] [place], [place], [address] Phone: [phone]   Fax:  [phone]

## 2020-08-13 ENCOUNTER — Ambulatory Visit: Payer: Medicare Other

## 2020-08-13 ENCOUNTER — Other Ambulatory Visit: Payer: Self-pay

## 2020-08-13 DIAGNOSIS — R4701 Aphasia: Secondary | ICD-10-CM | POA: Diagnosis present

## 2020-08-13 DIAGNOSIS — R41841 Cognitive communication deficit: Secondary | ICD-10-CM

## 2020-08-13 NOTE — Therapy (Signed)
[facility] [address] [place], [place], [address] Phone: [phone]   Fax:  [phone]  Speech Language Pathology Treatment/ Renewal-Discharge Summary  Patient Details  Name: Jill Rose MRN: [id number] Date of Birth: October 05, 1940 Referring Provider (SLP): Jamse Arn, MD   Encounter Date: 08/13/2020   End of Session - 08/13/20 1527    Visit Number 16    Number of Visits 20    Date for SLP Re-Evaluation 08/13/20    SLP Start Time 1330   15 minutes late   SLP Stop Time  1400    SLP Time Calculation (min) 30 min    Activity Tolerance Patient tolerated treatment well           Past Medical History:  Diagnosis Date  . Diabetes mellitus without complication ([facility])   . Essential hypertension   . High cholesterol   . Hypothyroidism   . Stroke ([facility])   . TIA (transient ischemic attack) 2018   [place] SUMMARY  Visits from Start of Care: 16   Current functional level related to goals / functional outcomes: See below. Current LTGs will be enforced for this session today, and then pt will be d/c'd due to pt is pleased with the current level.   Remaining deficits: Mild cognitive deficits - pt is functional at the moment, with things she is safe to do. She voiced good to excellent awareness of this during session today.   Education / Equipment: Memory compensations, cognitive communication compensation strategies.   Plan: Patient agrees to discharge.  Patient goals were partially met. Patient is being discharged due to being pleased with the current functional level.  ?????        No past surgical history on file.  There were no vitals filed for this visit.   Subjective Assessment - 08/13/20 1335    Subjective "Even though I have not come [name] has been helping me." "My husband and I are cooking together."    Patient is accompained by: --   [name] - aid   Currently in Pain?  No/denies                 ADULT SLP TREATMENT - 08/13/20 1338      General Information   Behavior/Cognition Alert;Cooperative;Pleasant mood      Treatment Provided   Treatment provided Cognitive-Linquistic      Cognitive-Linquistic Treatment   Treatment focused on Cognition;Patient/family/caregiver education    Skilled Treatment Pt reports she is getting along functionally with husband's assistance e.g., cooking and laundry, shopping, and cleaning (husband or aide does this). Pt states she thinks journal may be helping a little but  not as much as it was. SLP told pt to stop the journal if she thinks it is not helping. Aid stated, "She's good at reminding her husband what needs to be done (e.g., medicines, bills)."      Assessment / Recommendations / Plan   Plan Continue with current plan of care      Progression Toward Goals   Progression toward goals Progressing toward goals              SLP Short Term Goals - 05/14/20 1129      SLP SHORT TERM GOAL #1   Title pt will *attempt to access* her memory system for simple organization of discipline-specific items, or for temporal orientation x5 sessions    Time 1    Period Weeks    Status Not  Met      SLP SHORT TERM GOAL #2   Title pt will demo knowledge of items in her memory system/notebook by correctly accessing them in 2 sessions    Time 1    Period Weeks    Status Not Met      SLP SHORT TERM GOAL #3   Title pt will demonstrate error awareness in therapy tasks 75% with min cues for re-check in 3 sessions    Time 1    Period Weeks    Status Not Met      SLP SHORT TERM GOAL #4   Title pt will name 8 items for simple categories x 3 sessions    Time 1    Period Weeks    Status Not Met            SLP Long Term Goals - 08/13/20 1531      SLP LONG TERM GOAL #1   Title pt will demo correct usage of a memory compensation system for appointment and/or logging blood pressure and blood sugar, daily schedules in  3 sessions    Status Partially Met      SLP LONG TERM GOAL #2   Title Pt will maintain selective attention in functional task for 8 minutes in mod noisy environment with rare min cues x 2 sessions.    Status Partially Met      SLP LONG TERM GOAL #3   Title pt will demo anticipatory awareness regarding deficit areas, independently, in 3 sessions    Status Partially Met      SLP LONG TERM GOAL #4   Title pt will use compensations for anomia in 8 minutes simple conversation x3 visits    Status Deferred            Plan - 08/13/20 1528    Clinical Impression Statement [name] presents with improved cognitive-communication deficits. No aphasia noted today in session, in mod complex conversation. Pt and aid [name] report today that pt is now doing household tasks with her husband and aid states pt reminds husband [name] it is time to complete these things. She is writing out grocery lists, cutting up things for dinner, telling husband when to clean bathrooms, assisting with laundry, and other household tasks which she can safely perform. [name] told SLP she is pleased with her current level. Pt agrees with d/c today.    Treatment/Interventions SLP instruction and feedback;Functional tasks;Cognitive reorganization;Compensatory strategies;Internal/external aids;Multimodal communcation approach;Patient/family education;Environmental controls;Language facilitation;Compensatory techniques    Potential to Achieve Goals Good    Consulted and Agree with Plan of Care Patient           Patient will benefit from skilled therapeutic intervention in order to improve the following deficits and impairments:   Cognitive communication deficit  Aphasia    Problem List Patient Active Problem List   Diagnosis Date Noted  . Controlled type 2 diabetes mellitus with hyperglycemia, without long-term current use of insulin ([facility]) 04/06/2020  . Abnormality of gait 02/03/2020  . Benign essential HTN   . CKD  (chronic kidney disease), stage II   . Hemiparesis affecting left side as late effect of stroke ([facility])   . Labile blood glucose   . Labile blood pressure   . Elevated BUN   . Hyponatremia   . Diabetic peripheral neuropathy ([facility])   . 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 Dyslipidemia   . Class 1 obesity due to excess calories with serious comorbidity and  body mass index (BMI) of 30.0 to 30.9 in adult   . Left hemiparesis ([facility])   . Controlled type 2 diabetes mellitus with hyperglycemia ([facility])   . Morbid obesity ([facility])   . Leukocytosis   . History of TIA (transient ischemic attack)   . Acute CVA (cerebrovascular accident) ([facility]) 01/06/2020  . Acute ischemic stroke ([facility]) 01/05/2020  . Hypertensive emergency 01/26/2017  . TIA (transient ischemic attack) 01/26/2017  . Hypothyroidism 01/26/2017  . Aphasia   . Essential hypertension   . Diabetes mellitus without complication ([place])     [place] ,[place], [place]  08/13/2020, 3:32 PM  [facility] [address] [place], [place], [address] Phone: [phone]   Fax:  [phone]   Name: Jill Rose MRN: [id number] Date of Birth: Jul 16, 1941

## 2020-08-30 ENCOUNTER — Encounter: Payer: Medicare Other | Attending: Physical Medicine & Rehabilitation | Admitting: Physical Medicine & Rehabilitation

## 2020-08-30 ENCOUNTER — Encounter: Payer: Self-pay | Admitting: Physical Medicine & Rehabilitation

## 2020-08-30 ENCOUNTER — Other Ambulatory Visit: Payer: Self-pay

## 2020-08-30 VITALS — BP 153/85 | HR 79 | Temp 98.9°F

## 2020-08-30 DIAGNOSIS — E1165 Type 2 diabetes mellitus with hyperglycemia: Secondary | ICD-10-CM | POA: Diagnosis present

## 2020-08-30 DIAGNOSIS — M1712 Unilateral primary osteoarthritis, left knee: Secondary | ICD-10-CM

## 2020-08-30 DIAGNOSIS — G8194 Hemiplegia, unspecified affecting left nondominant side: Secondary | ICD-10-CM

## 2020-08-30 DIAGNOSIS — R269 Unspecified abnormalities of gait and mobility: Secondary | ICD-10-CM | POA: Diagnosis not present

## 2020-08-30 DIAGNOSIS — I63511 Cerebral infarction due to unspecified occlusion or stenosis of right middle cerebral artery: Secondary | ICD-10-CM | POA: Diagnosis not present

## 2020-08-30 DIAGNOSIS — I10 Essential (primary) hypertension: Secondary | ICD-10-CM | POA: Diagnosis present

## 2020-08-30 MED ORDER — DICLOFENAC SODIUM 1 % EX GEL: 2.0000 g | Freq: Four times a day (QID) | CUTANEOUS | 1 refills | Status: DC

## 2020-08-30 NOTE — Progress Notes (Signed)
Subjective:    Patient ID: Jill Rose, adult    DOB: 1940-08-05, 80 y.o.   MRN: [id number]  HPI Right-handed adult with history of diet-controlled diabetes mellitus, hypertension, hypothyroidism, TIA presents for follow up for right MCA infarct.  Last clinic visit on 05/31/20.  Caregiver supplements history. Since that time, pt is on a "break for therapies", unknown duration.  She continues to follow up with neuro.  BP Is slightly elevated, normally, controlled at home. CBG have not been checked. Denies falls. She did not follow up with PCP regarding depression.   Pain Inventory Average Pain 4 Pain Right Now 4 My pain is aching  In the last 24 hours, has pain interfered with the following? General activity 4 Relation with others 0 Enjoyment of life 0 What TIME of day is your pain at its worst? morning Sleep (in general) Good  Pain is worse with: standing and some activites Pain improves with: rest and tylenol Relief from Meds:  Only taking tylenol it works sometimes.      Family History  Problem Relation Age of Onset  . Heart disease Mother    Social History   Socioeconomic History  . Marital status: Married    Spouse name: Not on file  . Number of children: Not on file  . Years of education: Not on file  . Highest education level: Not on file  Occupational History  . Not on file  Tobacco Use  . Smoking status: Never Smoker  . Smokeless tobacco: Never Used  Vaping Use  . Vaping Use: Never used  Substance and Sexual Activity  . Alcohol use: No  . Drug use: No  . Sexual activity: Not on file  Other Topics Concern  . Not on file  Social History Narrative   Lives with spouse   Right Handed   Drinks 1 cup caffeine/daily   Social Determinants of Health   Financial Resource Strain: Not on file  Food Insecurity: Not on file  Transportation Needs: Not on file  Physical Activity: Not on file  Stress: Not on file  Social Connections: Not on file   History  reviewed. No pertinent surgical history. Past Medical History:  Diagnosis Date  . Diabetes mellitus without complication ([facility])   . Essential hypertension   . High cholesterol   . Hypothyroidism   . Stroke ([facility])   . TIA (transient ischemic attack) 2018   BP (!) 153/85   Pulse 79   Temp 98.9 F (37.2 C)   SpO2 98%   Opioid Risk Score:   Fall Risk Score:  `1  Depression screen PHQ 2/9  Depression screen [facility] 2/9 05/31/2020 02/03/2020  Decreased Interest 0 0  Down, Depressed, Hopeless 0 2  PHQ - 2 Score 0 2  Altered sleeping - 0  Tired, decreased energy - 3  Change in appetite - 0  Feeling bad or failure about yourself  - 0  Trouble concentrating - 0  Moving slowly or fidgety/restless - 1  Suicidal thoughts - 0  PHQ-9 Score - 6   Review of Systems  Constitutional: Negative.   HENT: Negative.   Eyes: Negative.   Respiratory: Negative.   Cardiovascular: Negative.   Gastrointestinal: Negative.   Endocrine: Negative.   Genitourinary: Negative.   Musculoskeletal: Positive for arthralgias, gait problem and myalgias.       Left knee pain  Skin: Negative.   Allergic/Immunologic: Negative.   Neurological: Positive for weakness.  Hematological:  Plavix  Psychiatric/Behavioral: Positive for confusion and dysphoric mood.  All other systems reviewed and are negative.     Objective:   Physical Exam  Constitutional: No distress . Vital signs reviewed. HENT: Normocephalic.  Atraumatic. Eyes: EOMI. No discharge. Cardiovascular: No JVD.  RRR. Respiratory: Normal effort.  No stridor.  Bilateral clear to auscultation. GI: Non-distended.  BS +. Skin: Warm and dry.  Intact. Psych: Flat. Normal behavior. Musc:  Mild edema with TTP at left knee Neuro: Alert HOH Motor:  LUE: 4+/5 proximal to distal LLE: 4+/5 proximal to distal LUE: Mild ataxia, Improving    Assessment & Plan:  Right-handed adult with history of diet-controlled diabetes mellitus, hypertension,  hypothyroidism, TIA presents for follow up for right MCA infarct.  1. Left side hemiparesis secondary to right MCA infarction is felt to be thromboembolic secondary to small and large vessel disease  Resume therapies  Continue follow up with Neuro  2. Hypertension.    Continue meds  Slightly elevated today   3. 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  Continue meds  Encouraged checking CBGs again x3.  4.  Gait abnormality  Resume therapies  Continue walker/wheelchiar for safety, only using wheelchair in community  Avoid treadmill at present  5. Depression  Plans to follow up with PCP, caregiver states not an issue  6. 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

## 2020-09-28 IMAGING — MR MR MRA HEAD W/O CM
1 series · 20 of 48 positions shown · non-contrast
Comparison: None.

CLINICAL DATA: Infarcts on MRI brain

EXAM:
MRA HEAD WITHOUT CONTRAST
TECHNIQUE: Angiographic images of the Circle of Willis were obtained using MRA
technique without intravenous contrast.

[Series 5: (id) mt fs · axial · 1.4mm · 0.43mm/px · z∈[-58,+43]mm · 20 of 154 slices shown]
[im 1/154]
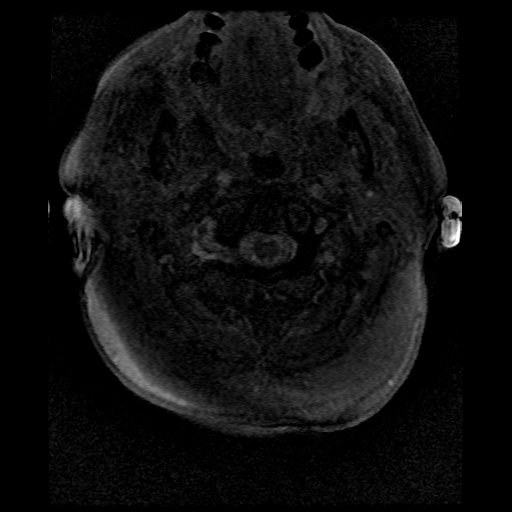
[im 4/154]
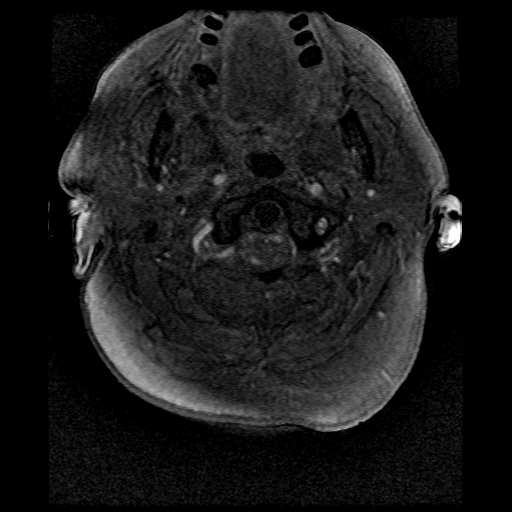
[im 7/154]
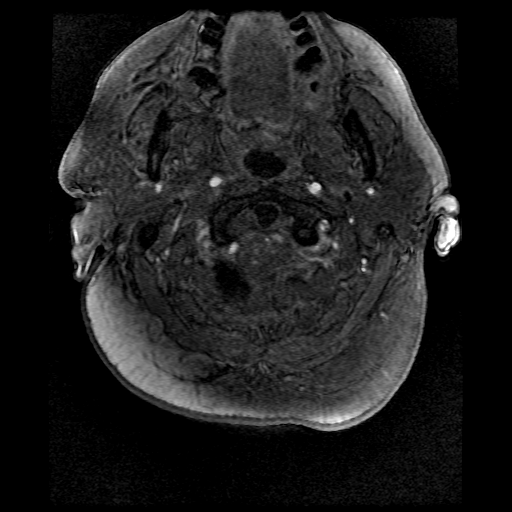
[im 10/154]
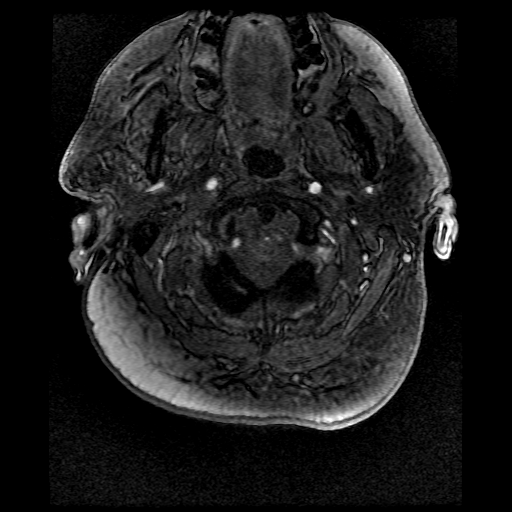
[im 14/154]
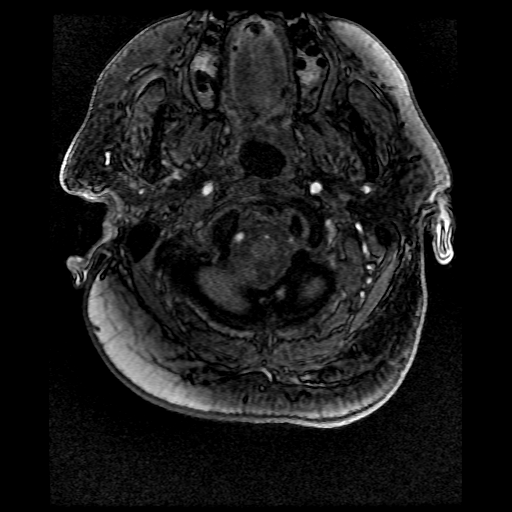
[im 17/154]
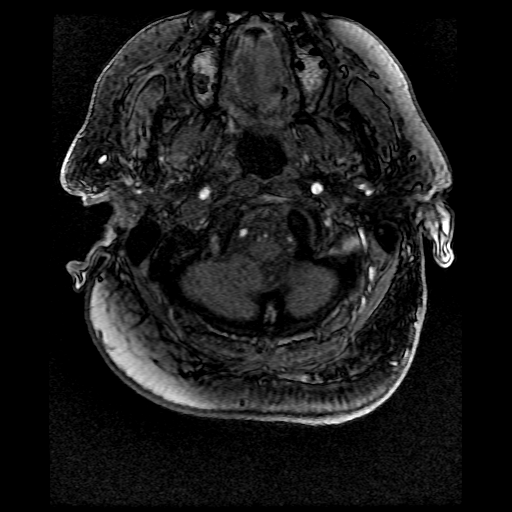
[im 20/154]
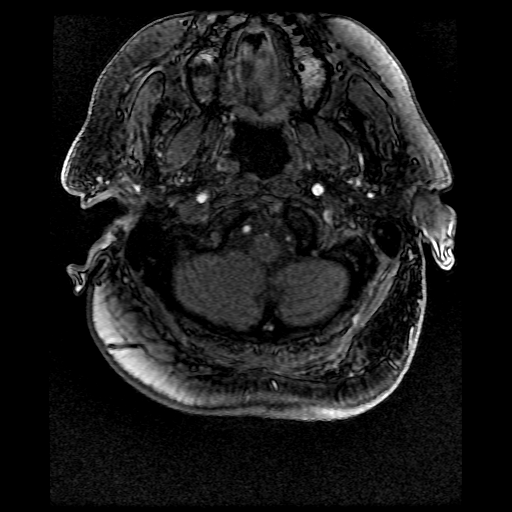
[im 23/154]
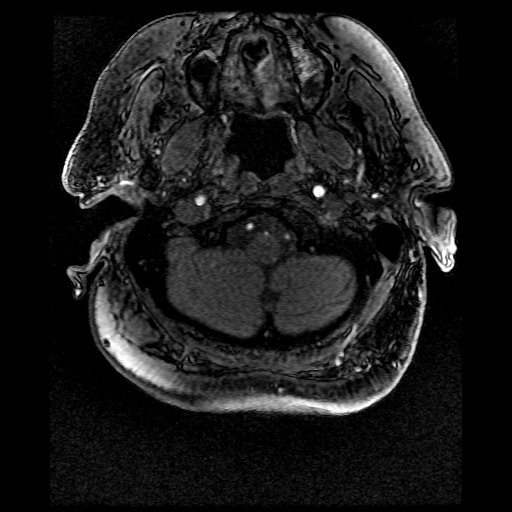
[im 27/154]
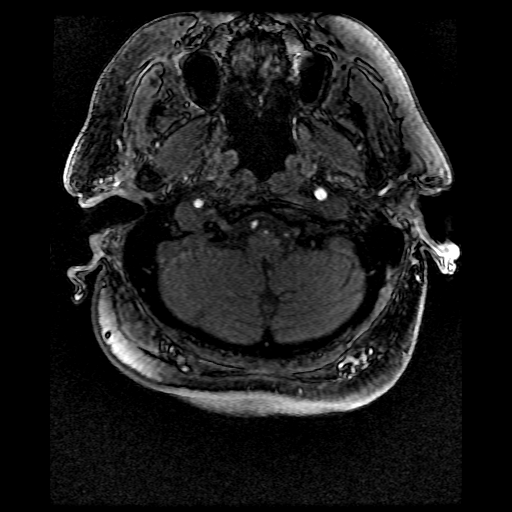
[im 30/154]
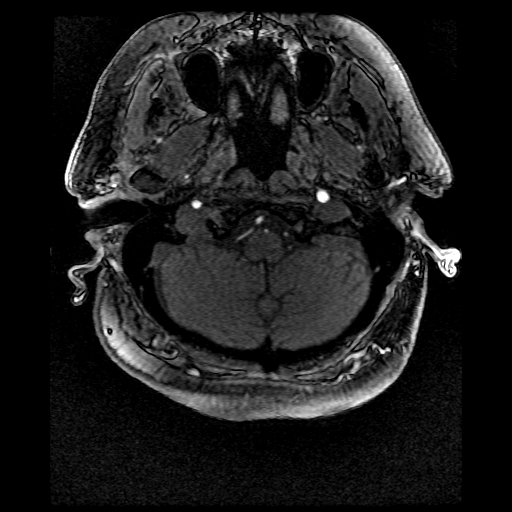
[im 33/154]
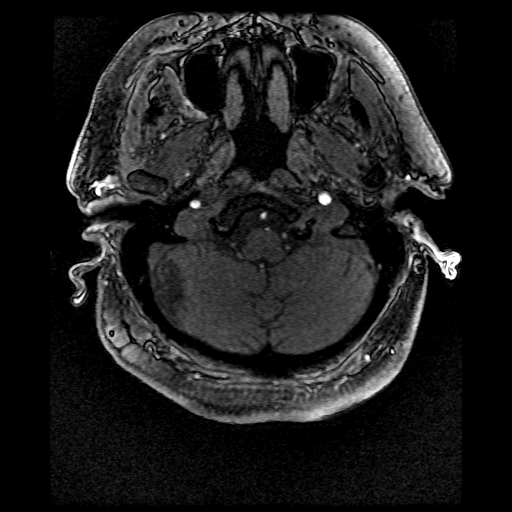
[im 36/154]
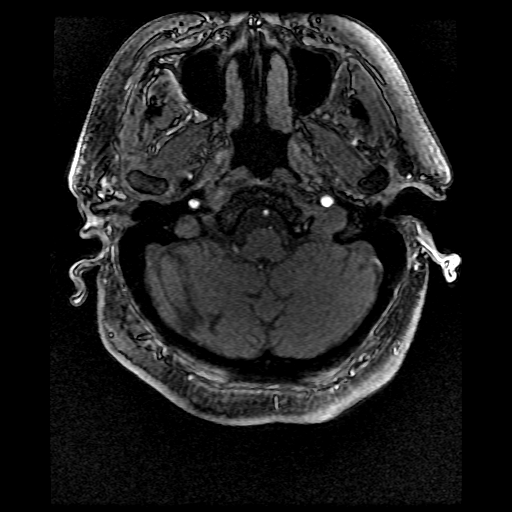
[im 49/154]
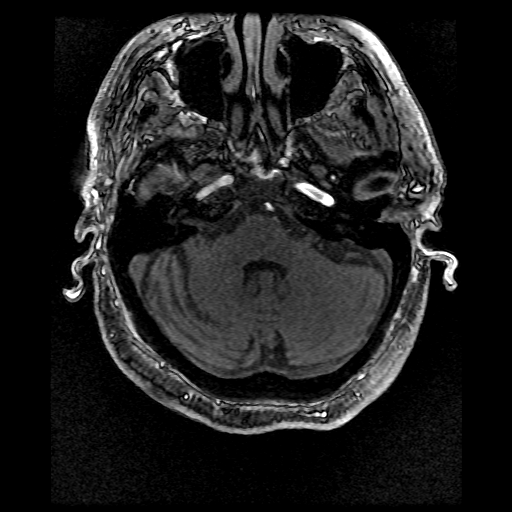
[im 69/154]
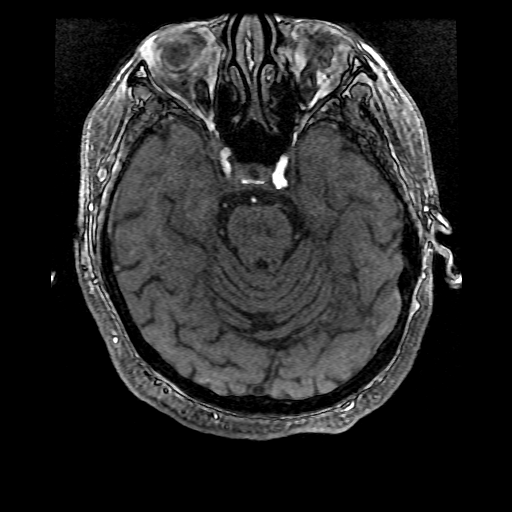
[im 79/154]
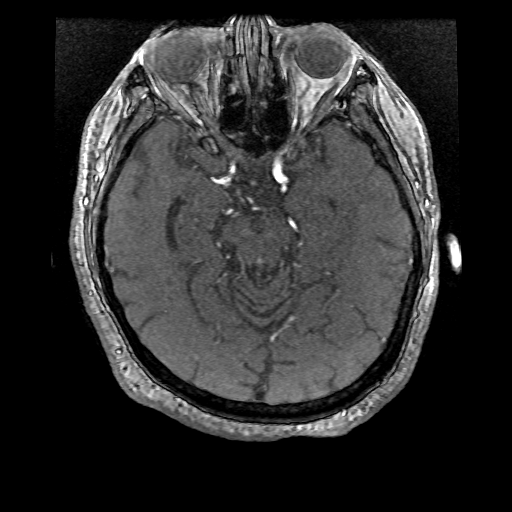
[im 88/154]
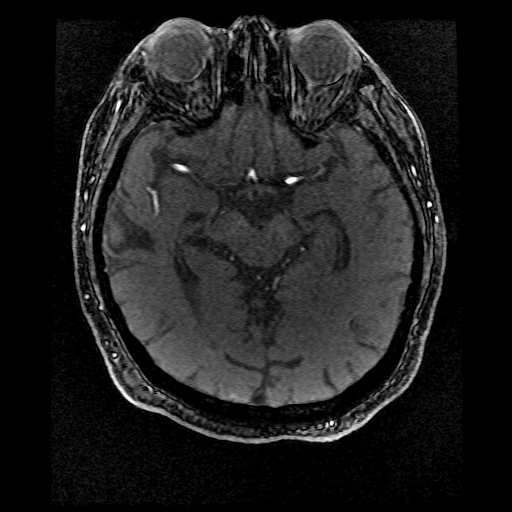
[im 108/154]
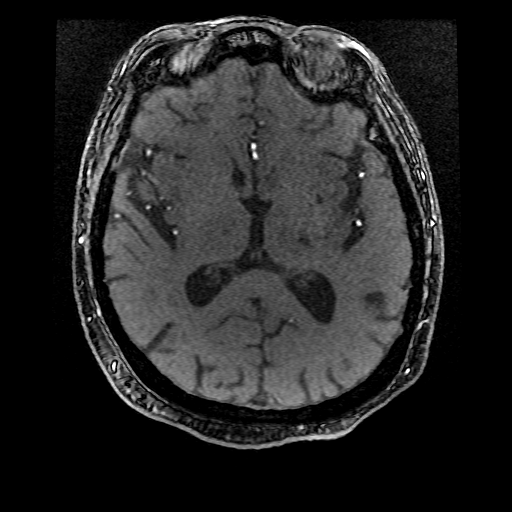
[im 127/154]
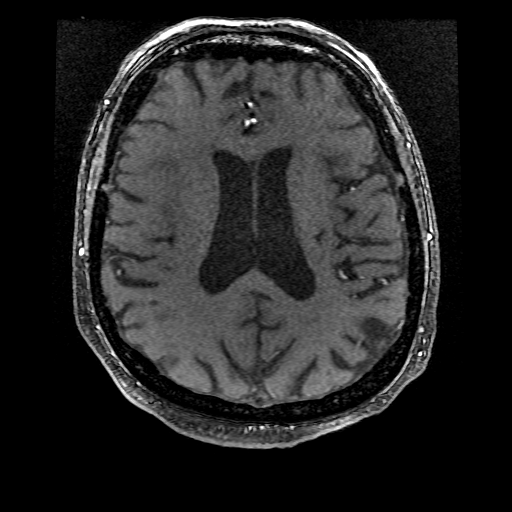
[im 131/154]
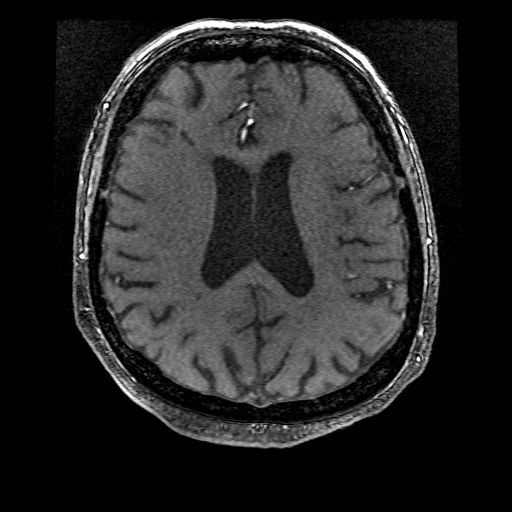
[im 147/154]
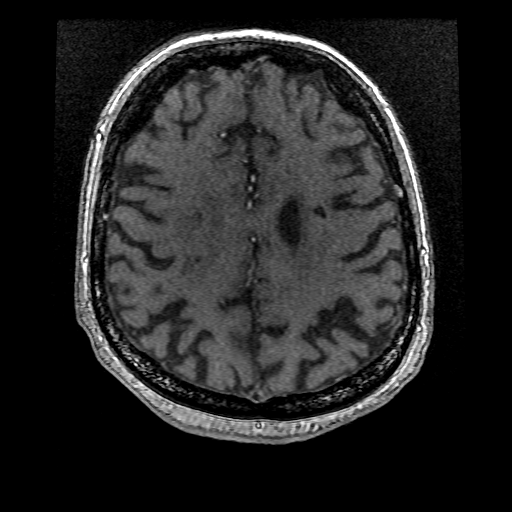

[20 of 48 positions shown; findings below may reference images not displayed]

FINDINGS: Motion artifact is present.

Intracranial internal carotid arteries are patent with
atherosclerotic irregularity and at least moderate stenosis of the
supraclinoid portion. Middle and anterior cerebral arteries are
patent. Right A1 ACA is congenitally small or stenotic. Proximal
left M2 MCA moderate to marked stenosis.

Intracranial vertebral arteries, basilar artery, posterior cerebral
arteries are patent. Moderate to marked stenosis of the mid basilar.
Bilateral PCA atherosclerotic irregularity with right P2 stenosis.
Bilateral posterior communicating arteries are present.

There is no aneurysm.
IMPRESSION: No proximal intracranial vessel occlusion.

Multifocal atherosclerotic irregularity and stenoses.

## 2020-10-05 ENCOUNTER — Ambulatory Visit (INDEPENDENT_AMBULATORY_CARE_PROVIDER_SITE_OTHER): Payer: Medicare Other | Admitting: Neurology

## 2020-10-05 ENCOUNTER — Other Ambulatory Visit: Payer: Self-pay

## 2020-10-05 ENCOUNTER — Encounter: Payer: Self-pay | Admitting: Neurology

## 2020-10-05 VITALS — BP 183/84 | HR 80 | Ht 62.0 in | Wt 150.0 lb

## 2020-10-05 DIAGNOSIS — F0280 Dementia in other diseases classified elsewhere without behavioral disturbance: Secondary | ICD-10-CM | POA: Diagnosis not present

## 2020-10-05 DIAGNOSIS — G309 Alzheimer's disease, unspecified: Secondary | ICD-10-CM | POA: Diagnosis not present

## 2020-10-05 DIAGNOSIS — F0150 Vascular dementia without behavioral disturbance: Secondary | ICD-10-CM

## 2020-10-05 MED ORDER — MEMANTINE HCL 10 MG PO TABS: 10.0000 mg | ORAL_TABLET | Freq: Two times a day (BID) | ORAL | 3 refills | Status: DC

## 2020-10-05 NOTE — Patient Instructions (Signed)
I had a long discussion with patient and her son regarding her remote stroke as well as mixed vascular dementia which seems to have shown some response to Namenda.  Continue Namenda 10 mg twice daily and increase participating in cognitively challenging activities.  Continue Plavix for stroke prevention and maintain aggressive risk factor modification strict control of hypertension with blood pressure goal below 130/90, lipids with LDL cholesterol goal below 70 mg percent.  She will return for follow-up in the future in 6 months or call earlier if necessary.

## 2020-10-05 NOTE — Progress Notes (Signed)
[facility] [address] [place]. [place] [id number] [phone]       OFFICE FOLLOW-UP NOTE  [name] Date of Birth:  10/16/1940 Medical Record Number:  [id number]   HPI: Initial visit 04/08/2020 [name] is a 80 year old [place] lady who is seen for first office follow-up visit following hospital consultation for stroke.  She is accompanied by her daughter.  History is obtained from them, review of electronic medical records and I personally reviewed pertinent available imaging films in PACS.  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  She presented on 01/05/2020 to [facility] with left-sided weakness which started a week prior to presentation.  She was not able to tell initially whether this was due to her arthritis or a stroke.  She continued to have left-sided problems and getting in and out of bed in chair and also had some slurred speech eventually prompting hospital visit.  CT scan of the head showed no acute abnormality but old left basal ganglia lacunar infarct.  MRI scan confirmed acute right corona radiata and posterior right temporal and occipital white matter infarcts.  There is also old bilateral corona radiata, left thalamus and left paramedian pontine lacunar infarcts.  MRI of the brain showed multifocal intracranial atherosclerotic changes and MRA of the neck showed 25% right ICA and left vertebral artery atherosclerotic multifocal narrowing.  2D echo showed normal ejection fraction without cardiac source of embolism.  LDL cholesterol of 196 mg percent and hemoglobin A1c was 7.6.  Patient was started on dual antiplatelet therapy aspirin Plavix for 3 months and was transferred to inpatient rehab.  She is finished rehab stay is currently living at home.  She is doing outpatient physical occupational therapy.  She is able to walk with a walker.  She is  shown some progressive improvement and can get in and out of her couch in bed by herself.  She still has mild residual left-sided weakness but it is improving.  The family feels that the patient is depressed she gets frustrated easily and she feels she is very poor stamina.  He saw primary care physician last week and she was prescribed Lexapro but she has not yet started it.  She is living at home with her husband.  They have a CNA who comes in for few hours every day.  Patient's husband is also having cognitive issues and daughter is concerned about the long-term care.  Patient also notices increased forgetfulness and short-term memory difficulties.  She has not yet had any lab work for evaluation for reversible causes. Update 05/04/2020 : She returns for follow-up after last visit a month ago.  She is accompanied by her son and husband.  They feel there has been slight regression in her memory and cognitive issues.  She keeps on asking the same question over and over again.  She has poor appetite and has low energy level.  She has regressed with her walking and refuses to walk and has extreme fear of falling.  She is getting some home physical and occupational therapy and gets up only with assistance.  She did undergo lab work at last visit and B12, RPR and homocystine were normal.  TSH was slightly elevated and she has seen her primary care physician since who has adjusted her thyroid medications.  EEG showed mild generalized slowing with 6 to 7 Hz theta activity throughout.  No  definite epileptiform activity was noted.  She is still has some dragging of the right foot for walking and is scared that she will fall.  She does have a walker but does not use it a lot.  She is tolerating Plavix well without bruising or bleeding.  Blood pressures well controlled today it is 137/72.  Last hemoglobin A1c was 7.3 and fasting sugars are usually in the 150s.  She has no family history of dementia.  She has never tried  Aricept or Namenda like medications but is willing to try them now.  She was seen in the ER briefly a few weeks ago for transient confusion which resolved and she left without any significant work-up being done. Update 10/05/2020 : She returns for follow-up after last visit 4 months ago.  She is accompanied by her son.  She has tolerated Namenda well without any dizziness, sleepiness or other side effects.  She has noticed improvement in her communication and speaking as well as short-term memory.  She still has trouble with multitasking and gets easy distractibility.  The daughter had some concerns as to whether she was depressed and had called requesting referral to neuropsychologist but I was unable to speak to her when I called and left message.  She still continues to spend most of the time in a wheelchair and she has intense fear of walking and falling and hence does not walk even with a walker.  She is mostly calm and composed and does not have any agitation, delusions or hallucinations.  She requires 24-hour care.  She remains on Plavix which is tolerating well without bruising or bleeding.  Blood pressure is usually well controlled today it is elevated in office at 183/84.  Sugar and cholesterol have been under good control.  No new complaints today.  Mini-Mental status exam today is 23/30 which is slightly improved from 21/30 in September 21 ROS:   14 system review of systems is positive for memory loss, difficulty multitasking fear of walking, depression, frustration, irritability, weakness, gait imbalance, ringing sound in the ears, discomfort all other systems negative PMH:  Past Medical History:  Diagnosis Date   Diabetes mellitus without complication (HCC)    Essential hypertension    High cholesterol    Hypothyroidism    Stroke (HCC)    TIA (transient ischemic attack) 2018    Social History:  Social History   Socioeconomic History   Marital status: Married    Spouse name:  Jill Rose   Number of children: Not on file   Years of education: Not on file   Highest education level: Not on file  Occupational History   Not on file  Tobacco Use   Smoking status: Never Smoker   Smokeless tobacco: Never Used  Vaping Use   Vaping Use: Never used  Substance and Sexual Activity   Alcohol use: No   Drug use: No   Sexual activity: Not on file  Other Topics Concern   Not on file  Social History Narrative   Lives with spouse   Right Handed   Drinks 1 cup caffeine/daily   Social Determinants of Health   Financial Resource Strain: Not on file  Food Insecurity: Not on file  Transportation Needs: Not on file  Physical Activity: Not on file  Stress: Not on file  Social Connections: Not on file  Intimate Partner Violence: Not on file    Medications:   Current Outpatient Medications on File Prior to Visit  Medication Sig Dispense  Refill   acetaminophen (TYLENOL) 325 MG tablet Take 2 tablets (650 mg total) by mouth every 4 (four) hours as needed for mild pain (or temp > 37.5 C (99.5 F)).     b complex vitamins tablet Take 1 tablet by mouth daily with lunch.     Barberry-Oreg Grape-Goldenseal (BERBERINE COMPLEX PO) Take 1 tablet by mouth daily with lunch.      Cholecalciferol (VITAMIN D3 PO) Take 1 tablet by mouth daily with lunch.      clopidogrel (PLAVIX) 75 MG tablet Take 1 tablet (75 mg total) by mouth daily. 30 tablet 2   Cyanocobalamin (VITAMIN B-12 PO) Take 1 tablet by mouth daily with lunch.     diclofenac Sodium (VOLTAREN) 1 % GEL Apply 2 g topically 4 (four) times daily. 350 g 1   escitalopram (LEXAPRO) 5 MG tablet Take 5 mg by mouth every morning.     hydrALAZINE (APRESOLINE) 10 MG tablet Take 1 tablet (10 mg total) by mouth at bedtime. 30 tablet 0   levothyroxine (SYNTHROID) 88 MCG tablet Take 1 tablet (88 mcg total) by mouth daily before breakfast. 30 tablet 0   ONETOUCH VERIO test strip 1 each daily.     telmisartan (MICARDIS) 80 MG  tablet Take 80 mg by mouth daily.     telmisartan-hydrochlorothiazide (MICARDIS HCT) 80-12.5 MG tablet Take 1 tablet by mouth daily.     amLODipine (NORVASC) 10 MG tablet Take 1 tablet (10 mg total) by mouth daily. 30 tablet 0   atorvastatin (LIPITOR) 80 MG tablet Take 1 tablet (80 mg total) by mouth daily. 90 tablet 0   metFORMIN (GLUCOPHAGE) 500 MG tablet Take 1 tablet (500 mg total) by mouth 2 (two) times daily with a meal. 60 tablet 0   No current facility-administered medications on file prior to visit.    Allergies:   Allergies  Allergen Reactions   Ammonia Shortness Of Breath, Itching, Swelling and Other (See Comments)    Confusion, Headache     Peroxide [Hydrogen Peroxide] Shortness Of Breath, Itching, Swelling and Other (See Comments)    Confusion, Headache      Physical Exam General: well developed, well nourished elderly [place] lady, seated, in no evident distress Head: head normocephalic and atraumatic.  Neck: supple with no carotid or supraclavicular bruits Cardiovascular: regular rate and rhythm, no murmurs Musculoskeletal: no deformity Skin:  no rash/petichiae Vascular:  Normal pulses all extremities Vitals:   10/05/20 1356  BP: (!) 183/84  Pulse: 80   Neurologic Exam Mental Status: Awake and fully alert. Oriented to place and time. Recent and remote memory intact. Attention span, concentration and fund of knowledge appropriate. Mood and affect appropriate.  Diminished recall 1/3.  Able to name  9 animals that can walk on 4 legs.  Difficulty in copying intersecting pentagons.  Clock drawing 3/4.  She was able to copy intersecting pentagons well.  On Mini-Mental status exam she scored 23/30 with 1 deficit in orientation, 2 in recall and spelling world backwards.  On geriatric depression scale she scored 4 which is not suggestive of depression. Cranial Nerves: Fundoscopic exam not done. Pupils equal, briskly reactive to light. Extraocular movements full  without nystagmus. Visual fields full to confrontation. Hearing intact. Facial sensation intact. Face, tongue, palate moves normally and symmetrically.  Motor: Normal bulk and tone. Normal strength in all tested extremity muscles except mild left hemiparesis 4+/5 strength with weakness of left grip and intrinsic hand muscles.  Orbits right over left upper extremity.  Diminished fine finger movements and foot tapping on the left.. Sensory.: intact to touch ,pinprick .position and vibratory sensation.  Coordination: Mildly impaired finger-to-nose and needle coordination on the left. Gait and Station: Deferred as she did not bring her walker and she is in a wheelchair. Reflexes: 2+ and asymmetric and brisker on the left. Toes downgoing.   NIHSS  4 Modified Rankin  4 MMSE - Mini Mental State Exam 10/05/2020 04/08/2020  Orientation to time 4 4  Orientation to Place 5 4  Registration 3 3  Attention/ Calculation 3 1  Recall 1 2  Language- name 2 objects 2 2  Language- repeat 1 0  Language- follow 3 step command 3 3  Language- read & follow direction 0 1  Write a sentence 1 1  Copy design 0 0  Copy design-comments - 4 animals  Total score 23 21    ASSESSMENT: 80 year old [place] origin lady with right MCA branch infarcts in June 2021 secondary to intracranial atherosclerosis with vascular risk factors of diabetes, hypertension, hyperlipidemia and atherosclerosis.  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  Continue Namenda 10 mg twice daily and increase participating in cognitively challenging activities.  Continue Plavix for stroke prevention and maintain aggressive risk factor modification strict control of hypertension with  blood pressure goal below 130/90, lipids with LDL cholesterol goal below 70 mg percent.  She will return for follow-up in the future in 6 months or call earlier if necessary. Greater than 50% of time during this 30 minute visit was spent on counseling,explanation of diagnosis of stroke and memory loss, planning of further management, discussion with patient and family and coordination of care Delia Heady, MD  [facility] [address] [place], [place] [address]  Phone [phone] Fax [phone] Note: This document was prepared with digital dictation and possible smart phrase technology. Any transcriptional errors that result from this process are unintentional

## 2020-10-11 ENCOUNTER — Other Ambulatory Visit: Payer: Self-pay

## 2020-10-11 ENCOUNTER — Encounter: Payer: Medicare Other | Attending: Physical Medicine & Rehabilitation | Admitting: Physical Medicine & Rehabilitation

## 2020-10-11 ENCOUNTER — Encounter: Payer: Self-pay | Admitting: Physical Medicine & Rehabilitation

## 2020-10-11 VITALS — BP 134/80 | HR 80 | Temp 97.7°F | Ht 62.0 in | Wt 150.0 lb

## 2020-10-11 DIAGNOSIS — R269 Unspecified abnormalities of gait and mobility: Secondary | ICD-10-CM | POA: Diagnosis present

## 2020-10-11 DIAGNOSIS — M1712 Unilateral primary osteoarthritis, left knee: Secondary | ICD-10-CM | POA: Diagnosis present

## 2020-10-11 DIAGNOSIS — E1165 Type 2 diabetes mellitus with hyperglycemia: Secondary | ICD-10-CM | POA: Diagnosis present

## 2020-10-11 DIAGNOSIS — I10 Essential (primary) hypertension: Secondary | ICD-10-CM

## 2020-10-11 DIAGNOSIS — I63511 Cerebral infarction due to unspecified occlusion or stenosis of right middle cerebral artery: Secondary | ICD-10-CM

## 2020-10-11 NOTE — Progress Notes (Signed)
Subjective:    Patient ID: Jill Rose, adult    DOB: Dec 23, 1940, 80 y.o.   MRN: [id number]  HPI Right-handed adult with history of diet-controlled diabetes mellitus, hypertension, hypothyroidism, TIA presents for follow up for right MCA infarct.  Last clinic visit on 08/30/20.  Caregiver supplements history. Since that time, pt states she is doing therapies at home.  She is following up with Neuro.  BP is controlled. She has not been checking CBGs. Denies falls. She has increased exercise. She had significant improvement in knee pain with Voltaren gel.   Pain Inventory Average Pain 0 Pain Right Now 0 My pain is intermittent and aching  In the last 24 hours, has pain interfered with the following? General activity 4 Relation with others 0 Enjoyment of life 0 What TIME of day is your pain at its worst? morning Sleep (in general) Good  Pain is worse with: standing and some activites Pain improves with: rest and tylenol Relief from Meds:  Only taking tylenol it works sometimes.  Family History  Problem Relation Age of Onset  . Heart disease Mother    Social History   Socioeconomic History  . Marital status: Married    Spouse name: Jonny Ruiz  . Number of children: Not on file  . Years of education: Not on file  . Highest education level: Not on file  Occupational History  . Not on file  Tobacco Use  . Smoking status: Never Smoker  . Smokeless tobacco: Never Used  Vaping Use  . Vaping Use: Never used  Substance and Sexual Activity  . Alcohol use: No  . Drug use: No  . Sexual activity: Not on file  Other Topics Concern  . Not on file  Social History Narrative   Lives with spouse   Right Handed   Drinks 1 cup caffeine/daily   Social Determinants of Health   Financial Resource Strain: Not on file  Food Insecurity: Not on file  Transportation Needs: Not on file  Physical Activity: Not on file  Stress: Not on file  Social Connections: Not on file   No past surgical  history on file. Past Medical History:  Diagnosis Date  . Diabetes mellitus without complication (HCC)   . Essential hypertension   . High cholesterol   . Hypothyroidism   . Stroke (HCC)   . TIA (transient ischemic attack) 2018   BP 134/80   Pulse 80   Temp 97.7 F (36.5 C)   Ht 5\' 2"  (1.575 m)   Wt 150 lb (68 kg)   SpO2 96%   BMI 27.44 kg/m   Opioid Risk Score:   Fall Risk Score:  `1  Depression screen PHQ 2/9  Depression screen [facility] 2/9 10/11/2020 05/31/2020 02/03/2020  Decreased Interest 0 0 0  Down, Depressed, Hopeless 0 0 2  PHQ - 2 Score 0 0 2  Altered sleeping - - 0  Tired, decreased energy - - 3  Change in appetite - - 0  Feeling bad or failure about yourself  - - 0  Trouble concentrating - - 0  Moving slowly or fidgety/restless - - 1  Suicidal thoughts - - 0  PHQ-9 Score - - 6   Review of Systems  Constitutional: Negative.   HENT: Negative.   Eyes: Negative.   Respiratory: Negative.   Cardiovascular: Negative.   Gastrointestinal: Negative.   Endocrine: Negative.   Genitourinary: Negative.   Musculoskeletal: Positive for arthralgias, gait problem and myalgias.  Left knee pain  Skin: Negative.   Allergic/Immunologic: Negative.   Neurological: Positive for weakness.  Hematological:       Plavix  Psychiatric/Behavioral: Positive for confusion and dysphoric mood.  All other systems reviewed and are negative.     Objective:   Physical Exam  Constitutional: No distress . Vital signs reviewed. HENT: Normocephalic.  Atraumatic. Eyes: EOMI. No discharge. Cardiovascular: No JVD.   Respiratory: Normal effort.  No stridor.   GI: Non-distended.   Skin: Warm and dry.  Intact. Psych: Flat. Delayed.  Musc:  No edema or tenderness in extremities Neuro: Alert HOH Motor:  LUE: 4+/5 proximal to distal LLE: 4+/5 proximal to distal LUE: Mild ataxia, Improving    Assessment & Plan:  Right-handed adult with history of diet-controlled diabetes mellitus,  hypertension, hypothyroidism, TIA presents for follow up for right MCA infarct.  1. 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 Hypertension.    Continue meds  Controlled today   3. 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  Continue meds  Encouraged checking CBGs again x4  4.  Gait abnormality  Resume therapies, reminded  Continue walker/wheelchiar for safety, only using wheelchair in community  Avoid treadmill at present  5. Depression  Plans to follow up with PCP, caregiver states not an issue  6. 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

## 2020-10-26 ENCOUNTER — Telehealth: Payer: Self-pay | Admitting: Physical Medicine & Rehabilitation

## 2020-10-26 DIAGNOSIS — I63511 Cerebral infarction due to unspecified occlusion or stenosis of right middle cerebral artery: Secondary | ICD-10-CM

## 2020-10-26 DIAGNOSIS — G8194 Hemiplegia, unspecified affecting left nondominant side: Secondary | ICD-10-CM

## 2020-10-26 DIAGNOSIS — R269 Unspecified abnormalities of gait and mobility: Secondary | ICD-10-CM

## 2020-10-26 NOTE — Telephone Encounter (Signed)
Patient needs PT referral to something close to [facility] please

## 2020-11-02 ENCOUNTER — Other Ambulatory Visit: Payer: Self-pay | Admitting: Physical Medicine & Rehabilitation

## 2020-11-02 NOTE — Addendum Note (Signed)
Addended by: Doreene Eland on: 11/02/2020 03:52 PM   Modules accepted: Orders

## 2020-11-02 NOTE — Telephone Encounter (Signed)
We can change the referral location.  Thanks.

## 2020-11-03 NOTE — Telephone Encounter (Signed)
PT referral sent to [facility] oupt PT @ [facility].

## 2020-11-03 NOTE — Telephone Encounter (Signed)
PT was the only therapy that we had discussed.  It looks like the neurology notes mention some cognitive deficits per daughter, but not confirmed, and did not mention any therapies and have started on medication.

## 2020-11-03 NOTE — Addendum Note (Signed)
Addended by: Doreene Eland on: 11/03/2020 11:37 AM   Modules accepted: Orders

## 2020-11-16 ENCOUNTER — Ambulatory Visit: Payer: Medicare Other | Attending: Physical Medicine & Rehabilitation

## 2020-11-16 ENCOUNTER — Other Ambulatory Visit: Payer: Self-pay

## 2020-11-16 DIAGNOSIS — I69354 Hemiplegia and hemiparesis following cerebral infarction affecting left non-dominant side: Secondary | ICD-10-CM | POA: Insufficient documentation

## 2020-11-16 DIAGNOSIS — R2689 Other abnormalities of gait and mobility: Secondary | ICD-10-CM | POA: Insufficient documentation

## 2020-11-16 DIAGNOSIS — I63511 Cerebral infarction due to unspecified occlusion or stenosis of right middle cerebral artery: Secondary | ICD-10-CM | POA: Insufficient documentation

## 2020-11-16 DIAGNOSIS — R278 Other lack of coordination: Secondary | ICD-10-CM | POA: Diagnosis present

## 2020-11-16 DIAGNOSIS — R2681 Unsteadiness on feet: Secondary | ICD-10-CM | POA: Insufficient documentation

## 2020-11-16 DIAGNOSIS — M6281 Muscle weakness (generalized): Secondary | ICD-10-CM | POA: Insufficient documentation

## 2020-11-16 NOTE — Patient Instructions (Signed)
Access Code: [email or web address] URL: [email or web address] Date: 11/16/2020 Prepared by: Claude Manges  Exercises Sit to Stand with Counter Support - 1 x daily - 4 x weekly - 3 sets - 10 reps Seated March with Resistance - 1 x daily - 4 x weekly - 3 sets - 10 reps Seated Hip Abduction with Resistance - 1 x daily - 4 x weekly - 3 sets - 10 reps Seated Long Arc Quad - 1 x daily - 4 x weekly - 3 sets - 10 reps - 3 second hold Seated inner thigh squeeze with ball or pillow - 1 x daily - 4 x weekly - 3 sets - 10 reps - 3 seconds hold

## 2020-11-16 NOTE — Therapy (Signed)
[facility] [address] [place], [place], [address] Phone: [phone]   Fax:  [phone]  Physical Therapy Evaluation  Patient Details  Name: Jill Rose MRN: [id number] Date of Birth: [birth year] Referring Provider (PT): Allena Katz, Maryln Gottron, MD - PM&R  Micki Riley, MD - Neuro   Encounter Date: 11/16/2020   PT End of Session - 11/16/20 1808    Visit Number 1    Number of Visits 20    Date for PT Re-Evaluation 02/08/21    Authorization Type UHC MCR    PT Start Time 1449    PT Stop Time 1530    PT Time Calculation (min) 41 min    Equipment Utilized During Treatment Gait belt    Activity Tolerance Patient tolerated treatment well;Patient limited by fatigue    Behavior During Therapy Flat affect           Past Medical History:  Diagnosis Date  . Diabetes mellitus without complication (HCC)   . Essential hypertension   . High cholesterol   . Hypothyroidism   . Stroke (HCC)   . TIA (transient ischemic attack) 2018    History reviewed. No pertinent surgical history.  There were no vitals filed for this visit.    Subjective Assessment - 11/16/20 1451    Subjective Was seen post stroke at [facility] for PT for 24 visits, discharged d/t plateau in progress with HEP, walking practice at home. Also  Had shots for knee pain bilateral last week which have helped with arthritis knee pain she also gets.    Patient is accompained by: --   Caregiver, Ranay   Pertinent History Stroke 01/05/20 - corona radiata and posterior right temporal and occipital white matter infarcts. 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 Strict HTN control BP to be kept below 130/90    Limitations Walking;Standing    Patient Stated Goals to get back to walking, get stronger    Currently in Pain? No/denies              [facility] PT Assessment - 11/16/20 1448      Assessment   Medical Diagnosis I63.511 (ICD-10-CM) - Right  middle cerebral artery stroke, G81.94 (ICD-10-CM) - Left hemiparesis, R26.9 (ICD-10-CM) - Abnormality of gait    Referring Provider (PT) [name], MD - PM&R  Micki Riley, MD - Neuro    Prior Therapy post stroke 2021 in neurorehab      Balance Screen   Has the patient fallen in the past 6 months No    Has the patient had a decrease in activity level because of a fear of falling?  Yes      Home Environment   Additional Comments lives with husband Randall [place], house. ramp to enter. Turned living room into a bedroom to have a first floor living space. Has a caregiver full time - CNA Ranay,Mon Tues and Fri      Prior Function   Level of Independence Needs assistance with ADLs      Cognition   Overall Cognitive Status Impaired/Different from baseline      Observation/Other Assessments   Focus on Therapeutic Outcomes (FOTO)  20%      Coordination   Gross Motor Movements are Fluid and Coordinated No    Coordination and Movement Description slight incoordination of movement all along LUE/LLE.      Posture/Postural Control   Posture Comments slight forward trunk flexion in  standing with knee hyperext and WS toward left. Seated with WS toward involved left side.      ROM / Strength   AROM / PROM / Strength Strength      Strength   Strength Assessment Site Hip;Knee;Ankle    Right/Left Hip Right;Left    Right Hip Flexion 3+/5    Left Hip Flexion 3/5    Right/Left Knee Right;Left    Right Knee Flexion 3+/5    Right Knee Extension 4-/5    Left Knee Flexion 3/5    Left Knee Extension 3/5    Right/Left Ankle Right;Left    Right Ankle Dorsiflexion 4-/5    Left Ankle Dorsiflexion 3+/5      Palpation   Palpation comment minimal swelling noted B knees but not pain after recent knee injections      Bed Mobility   Bed Mobility --   to be assessed further     Transfers   Five time sit to stand comments  1 min 46 sec very fear avoidant and BUE Support, to [facility]       Standardized Balance Assessment   Standardized Balance Assessment Timed Up and Go Test;Five Times Sit to Stand      Timed Up and Go Test   Normal TUG (seconds) --   Attempted with FWW and CGA/minA, WC follow - took 1 min 55 seconds to walk forward about 5 feet, short shuffling steps with high fear of falling. Unable to complete d/t fear and c/o fatigue                     Objective measurements completed on examination: See above findings.               PT Education - 11/16/20 1807    Education Details Initial PT POC and HEP. Educated in importance of consistency with home exercises for strength gains. Access Code: [email or web address]    Person(s) Educated Patient;Caregiver(s)    Methods Explanation;Demonstration;Handout    Comprehension Verbalized understanding;Need further instruction            PT Short Term Goals - 11/16/20 1818      PT SHORT TERM GOAL #1   Title Independent with initial HEP with caregiver assistance    Time 4    Period Weeks    Status New    Target Date 12/14/20      PT SHORT TERM GOAL #2   Title Pt will undergo further assessment of TUG with STG and LTG to be written as appropriate.    Time 4    Period Weeks    Status New    Target Date 12/14/20             PT Long Term Goals - 11/16/20 1819      PT LONG TERM GOAL #1   Title Pt will be independent with final HEP in order to build upon functional gains made in therapy.    Time 10    Period Weeks    Status New    Target Date 02/08/21      PT LONG TERM GOAL #2   Title Attain full TUG time and decrease TUG time by at least 50%  with RW to demo decr fall risk    Baseline Unable to fully assess    Time 10    Period Weeks    Target Date 02/08/21      PT LONG TERM GOAL #3   Title Pt will  ambulate at least 2250' consecutively with RW with min guard in order to demo improved household mobility.    Time 10    Period Weeks    Target Date 02/08/21      PT LONG TERM GOAL #4   Title  Pt will decr 5x sit <> stand time to 40 seconds or less from mat table with UE support in order to demo improved functional BLE strength.    Time 10    Period Weeks    Target Date 02/08/21                  Plan - 11/16/20 1810    Clinical Impression Statement Pt is a 80 yo female who sustained a R MCA stroke June 2021. She currently presents with continued general mms weakness and deconditioning (LUE/LE weaker than the right), postural instability in sitting and standing, decreased transfers and ambulatory ability. She reports high fear of falling and this was observed with sit to stand transfers with guarding provided and cues for sequencing, safety. She will benefit from skilled physical therapy to work on improving functional strength, balance, and mobility, overall independence with functional mobility with less assist required from caregivers/family.    Personal Factors and Comorbidities Past/Current Experience;Time since onset of injury/illness/exacerbation    Stability/Clinical Decision Making Evolving/Moderate complexity    Clinical Decision Making Moderate    Rehab Potential Fair    PT Frequency 2x / week    PT Duration --   8-10 weeks   PT Treatment/Interventions ADLs/Self Care Home Management;Cryotherapy;Electrical Stimulation;Moist Heat;Gait training;DME Instruction;Neuromuscular re-education;Therapeutic exercise;Therapeutic activities;Functional mobility training;Patient/family education;Manual techniques;Energy conservation;Orthotic Fit/Training;Vestibular;Taping    PT Next Visit Plan Reassess HEP. Reassess TUG and try to get a complete time/score. General strength, fxnal mobility, gait as tolerated. Manual/modalities as needed    Consulted and Agree with Plan of Care Patient;Family member/caregiver    Family Member Consulted Caregiver/CNA Ranay           Patient will benefit from skilled therapeutic intervention in order to improve the following deficits and  impairments:  Abnormal gait,Difficulty walking,Impaired UE functional use,Decreased endurance,Decreased balance,Decreased mobility,Decreased strength,Postural dysfunction,Pain,Impaired vision/preception,Impaired tone  Visit Diagnosis: Hemiplegia and hemiparesis following cerebral infarction affecting left non-dominant side (HCC) - Plan: PT plan of care cert/re-cert  Unsteadiness on feet - Plan: PT plan of care cert/re-cert  Muscle weakness (generalized) - Plan: PT plan of care cert/re-cert  Other abnormalities of gait and mobility - Plan: PT plan of care cert/re-cert  Right middle cerebral artery stroke (HCC) - Plan: PT plan of care cert/re-cert     Problem List Patient Active Problem List   Diagnosis Date Noted  . Primary osteoarthritis of left knee 08/30/2020  . Controlled type 2 diabetes mellitus with hyperglycemia, without long-term current use of insulin (HCC) 04/06/2020  . Abnormality of gait 02/03/2020  . Benign essential HTN   . CKD (chronic kidney disease), stage II   . Hemiparesis affecting left side as late effect of stroke (HCC)   . Labile blood glucose   . Labile blood pressure   . Elevated BUN   . Hyponatremia   . Diabetic peripheral neuropathy (HCC)   . 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 Dyslipidemia   . Class 1 obesity due to excess calories with serious comorbidity and body mass index (BMI) of 30.0 to 30.9 in adult   . Left hemiparesis (HCC)   . Controlled type 2 diabetes mellitus with hyperglycemia (HCC)   . Morbid obesity (  HCC)   . Leukocytosis   . History of TIA (transient ischemic attack)   . Acute CVA (cerebrovascular accident) (HCC) 01/06/2020  . Acute ischemic stroke (HCC) 01/05/2020  . Hypertensive emergency 01/26/2017  . TIA (transient ischemic attack) 01/26/2017  . Hypothyroidism 01/26/2017  . Aphasia   . Essential hypertension   . Diabetes mellitus without complication (HCC)     Anson Crofts, PT, DPT 11/16/2020,  6:23 PM  [facility] [address] [place], [place], [address] Phone: [phone]   Fax:  [phone]  Name: Jill Rose MRN: [id number] Date of Birth: Feb 21, 1941

## 2020-11-29 ENCOUNTER — Ambulatory Visit: Payer: Medicare Other

## 2020-11-29 ENCOUNTER — Other Ambulatory Visit: Payer: Self-pay

## 2020-11-29 DIAGNOSIS — I63511 Cerebral infarction due to unspecified occlusion or stenosis of right middle cerebral artery: Secondary | ICD-10-CM

## 2020-11-29 DIAGNOSIS — R2689 Other abnormalities of gait and mobility: Secondary | ICD-10-CM

## 2020-11-29 DIAGNOSIS — M6281 Muscle weakness (generalized): Secondary | ICD-10-CM

## 2020-11-29 DIAGNOSIS — I69354 Hemiplegia and hemiparesis following cerebral infarction affecting left non-dominant side: Secondary | ICD-10-CM | POA: Diagnosis not present

## 2020-11-29 DIAGNOSIS — R2681 Unsteadiness on feet: Secondary | ICD-10-CM

## 2020-11-29 DIAGNOSIS — R278 Other lack of coordination: Secondary | ICD-10-CM

## 2020-11-29 NOTE — Therapy (Signed)
[facility] [address] [place], [place], [address] Phone: [phone]   Fax:  [phone]  Physical Therapy Treatment  Patient Details  Name: Jill Rose MRN: [id number] Date of Birth: [birth year] Referring Provider (PT): Allena Katz, Maryln Gottron, MD - PM&R  Micki Riley, MD - Neuro   Encounter Date: 11/29/2020   PT End of Session - 11/29/20 1533    Visit Number 2    Number of Visits 20    Date for PT Re-Evaluation 02/08/21    Authorization Type UHC MCR    PT Start Time 1445    PT Stop Time 1530    PT Time Calculation (min) 45 min    Equipment Utilized During Treatment Gait belt    Activity Tolerance Patient tolerated treatment well;Patient limited by fatigue    Behavior During Therapy Flat affect           Past Medical History:  Diagnosis Date  . Diabetes mellitus without complication (HCC)   . Essential hypertension   . High cholesterol   . Hypothyroidism   . Stroke (HCC)   . TIA (transient ischemic attack) 2018    History reviewed. No pertinent surgical history.  There were no vitals filed for this visit.   Subjective Assessment - 11/29/20 1447    Subjective "Got shots, doesnt seem to make a difference" but denies any pain today    Patient is accompained by: --   Caregiver, Ranay   Pertinent History Stroke 01/05/20 - corona radiata and posterior right temporal and occipital white matter infarcts. 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 Strict HTN control BP to be kept below 130/90    Limitations Walking;Standing    Patient Stated Goals to get back to walking, get stronger    Currently in Pain? No/denies              [facility] PT Assessment - 11/29/20 0001      Timed Up and Go Test   Normal TUG (seconds) --   5 :03 minutes                        [facility] Adult PT Treatment/Exercise - 11/29/20 0001      Therapeutic Activites    Therapeutic Activities Other Therapeutic  Activities    Other Therapeutic Activities anterior WS 5 x 3 in sitting EOM high perch position mod cues for increasing WS. Mod A for scooting forward in WC or on EOM with multiple reps. STS x 15 to FWW with CGA to minA (especially for anterior WS). STS with step forward /bwd x 2 steps each, 5 reps. Stand pivot transfers with FWW and CGA-min A as needed if festinating steps/freezing started, mod cues for sequencing walker positioning   with FWW/ gait belt     Exercises   Exercises Lumbar;Knee/Hip      Knee/Hip Exercises: Seated   Long Arc Quad Both;Strengthening;2 sets;10 reps   1# AW B   Ball Squeeze 2 x 10 green ball    Marching Strengthening;2 sets;10 reps    Abd/Adduction Limitations ABD red Tb x 20                    PT Short Term Goals - 11/29/20 1722      PT SHORT TERM GOAL #1   Title Independent with initial HEP with caregiver assistance    Time 4    Period Weeks  Target Date 12/14/20      PT SHORT TERM GOAL #2   Title Pt will undergo further assessment of TUG with STG and LTG to be written as appropriate.    Time 4    Period Weeks    Status Achieved    Target Date 12/14/20             PT Long Term Goals - 11/29/20 1722      PT LONG TERM GOAL #1   Title Pt will be independent with final HEP in order to build upon functional gains made in therapy.    Time 10    Period Weeks      PT LONG TERM GOAL #2   Title Attain full TUG time and decrease TUG time by at least 50%  with RW to demo decr fall risk    Baseline Initial assessed 11/29/20: 5:03 minutes with FWW, CGA, WC follow and mod to max cues and encouragement to advance/walk forward    Time 10    Period Weeks      PT LONG TERM GOAL #3   Title Pt will ambulate at least 16' consecutively with RW with min guard in order to demo improved household mobility.    Time 10    Period Weeks      PT LONG TERM GOAL #4   Title Pt will decr 5x sit <> stand time to 40 seconds or less from mat table with UE  support in order to demo improved functional BLE strength.    Time 10    Period Weeks                 Plan - 11/29/20 1534    Clinical Impression Statement Jill Rose tolerated todays session nicely. Alot of session with focus on functional mobility/transfers and she demonstrated most difficulty with backwards stepping requiring mod to mx cues for sequencing, intermittently needing some assist for stepping forward - would demo some festinating gait, freezing like episodes. Has a lot of fear avoidant behaviors but with repeated practice she demonstrated less fearfulness with getting into standing and step taking in standing.    Personal Factors and Comorbidities Past/Current Experience;Time since onset of injury/illness/exacerbation    Stability/Clinical Decision Making Evolving/Moderate complexity    Rehab Potential Fair    PT Frequency 2x / week    PT Duration --   8-10 weeks   PT Treatment/Interventions ADLs/Self Care Home Management;Cryotherapy;Electrical Stimulation;Moist Heat;Gait training;DME Instruction;Neuromuscular re-education;Therapeutic exercise;Therapeutic activities;Functional mobility training;Patient/family education;Manual techniques;Energy conservation;Orthotic Fit/Training;Vestibular;Taping    PT Next Visit Plan Reassess HEP. Reassess TUG and try to get a complete time/score. General strength, fxnal mobility, gait as tolerated. Manual/modalities as needed    Consulted and Agree with Plan of Care Patient;Family member/caregiver    Family Member Consulted Caregiver/CNA Ranay           Patient will benefit from skilled therapeutic intervention in order to improve the following deficits and impairments:  Abnormal gait,Difficulty walking,Impaired UE functional use,Decreased endurance,Decreased balance,Decreased mobility,Decreased strength,Postural dysfunction,Pain,Impaired vision/preception,Impaired tone  Visit Diagnosis: Unsteadiness on feet  Hemiplegia and hemiparesis  following cerebral infarction affecting left non-dominant side (HCC)  Muscle weakness (generalized)  Other abnormalities of gait and mobility  Right middle cerebral artery stroke [facility])  Other lack of coordination     Problem List Patient Active Problem List   Diagnosis Date Noted  . Primary osteoarthritis of left knee 08/30/2020  . Controlled type 2 diabetes mellitus with hyperglycemia, without long-term current use of insulin (  HCC) 04/06/2020  . Abnormality of gait 02/03/2020  . Benign essential HTN   . CKD (chronic kidney disease), stage II   . Hemiparesis affecting left side as late effect of stroke (HCC)   . Labile blood glucose   . Labile blood pressure   . Elevated BUN   . Hyponatremia   . Diabetic peripheral neuropathy (HCC)   . 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 Dyslipidemia   . Class 1 obesity due to excess calories with serious comorbidity and body mass index (BMI) of 30.0 to 30.9 in adult   . Left hemiparesis (HCC)   . Controlled type 2 diabetes mellitus with hyperglycemia (HCC)   . Morbid obesity (HCC)   . Leukocytosis   . History of TIA (transient ischemic attack)   . Acute CVA (cerebrovascular accident) (HCC) 01/06/2020  . Acute ischemic stroke (HCC) 01/05/2020  . Hypertensive emergency 01/26/2017  . TIA (transient ischemic attack) 01/26/2017  . Hypothyroidism 01/26/2017  . Aphasia   . Essential hypertension   . Diabetes mellitus without complication (HCC)     Anson Crofts, PT, DPT 11/29/2020, 5:23 PM  [facility] [address] [place], [place], [address] Phone: [phone]   Fax:  [phone]  Name: Jill Rose MRN: [id number] Date of Birth: [birth year]

## 2020-11-30 ENCOUNTER — Ambulatory Visit: Payer: Medicare Other | Admitting: Rehabilitative and Restorative Service Providers"

## 2020-11-30 ENCOUNTER — Encounter: Payer: Self-pay | Admitting: Rehabilitative and Restorative Service Providers"

## 2020-11-30 DIAGNOSIS — R2689 Other abnormalities of gait and mobility: Secondary | ICD-10-CM

## 2020-11-30 DIAGNOSIS — I69354 Hemiplegia and hemiparesis following cerebral infarction affecting left non-dominant side: Secondary | ICD-10-CM

## 2020-11-30 DIAGNOSIS — M6281 Muscle weakness (generalized): Secondary | ICD-10-CM

## 2020-11-30 DIAGNOSIS — R2681 Unsteadiness on feet: Secondary | ICD-10-CM

## 2020-11-30 DIAGNOSIS — R278 Other lack of coordination: Secondary | ICD-10-CM

## 2020-11-30 DIAGNOSIS — I63511 Cerebral infarction due to unspecified occlusion or stenosis of right middle cerebral artery: Secondary | ICD-10-CM

## 2020-11-30 NOTE — Therapy (Signed)
[place]. [place], [place], [address] Phone: [phone]   Fax:  [phone]  Physical Therapy Treatment  Patient Details  Name: Jill Rose MRN: [id number] Date of Birth: [birth year] Referring Provider (PT): Posey Pronto, Domenick Bookbinder, MD - PM&R  Garvin Fila, MD - Neuro   Encounter Date: 11/30/2020   PT End of Session - 11/30/20 1324    Visit Number 3    Number of Visits 20    Date for PT Re-Evaluation 02/08/21    Authorization Type UHC MCR    PT Start Time 1240    PT Stop Time 1319    PT Time Calculation (min) 39 min    Activity Tolerance Patient tolerated treatment well;Patient limited by fatigue    Behavior During Therapy Flat affect           Past Medical History:  Diagnosis Date  . Diabetes mellitus without complication ([place])   . Essential hypertension   . High cholesterol   . Hypothyroidism   . Stroke ([place])   . TIA (transient ischemic attack) 2018    History reviewed. No pertinent surgical history.  There were no vitals filed for this visit.   Subjective Assessment - 11/30/20 1323    Subjective Pt states that she is doing okay.    Patient Stated Goals to get back to walking, get stronger    Currently in Pain? No/denies                             [facility] Adult PT Treatment/Exercise - 11/30/20 0001      Therapeutic Activites    Therapeutic Activities Other Therapeutic Activities    Other Therapeutic Activities In parallel bars:  Standing, ambulating down length of parallel bars.  Side stepping down parallel bars and back x1 lap.   cuing for larger step length, encouragement     Lumbar Exercises: Aerobic   Nustep L3, x6 min      Knee/Hip Exercises: Seated   Long Arc Quad Both;Strengthening;2 sets;10 reps    Long Arc Quad Weight 2 lbs.    Ball Squeeze 2 x 10 green ball    Clamshell with TheraBand Yellow   2x10   Marching Strengthening;2 sets;10 reps    Marching  Limitations 2    Hamstring Curl Both;2 sets;10 reps;Strengthening    Hamstring Limitations yellow Tband    Abduction/Adduction  Both;2 sets;10 reps;Strengthening    Abd/Adduction Limitations yellow Tband                    PT Short Term Goals - 11/30/20 1330      PT SHORT TERM GOAL #1   Title Independent with initial HEP with caregiver assistance    Status Partially Met             PT Long Term Goals - 11/29/20 1722      PT LONG TERM GOAL #1   Title Pt will be independent with final HEP in order to build upon functional gains made in therapy.    Time 10    Period Weeks      PT LONG TERM GOAL #2   Title Attain full TUG time and decrease TUG time by at least 50%  with RW to demo decr fall risk    Baseline Initial assessed 11/29/20: 5:03 minutes with FWW, CGA, WC follow and mod to max cues and encouragement to advance/walk  forward    Time 10    Period Weeks      PT LONG TERM GOAL #3   Title Pt will ambulate at least 32' consecutively with RW with min guard in order to demo improved household mobility.    Time 10    Period Weeks      PT LONG TERM GOAL #4   Title Pt will decr 5x sit <> stand time to 40 seconds or less from mat table with UE support in order to demo improved functional BLE strength.    Time 10    Period Weeks                 Plan - 11/30/20 1327    Clinical Impression Statement Patient did well with session today.  She requires cuing with ambulation/transfers to take longer steps.  She was encouraged with ambulation in parallel bars and enjoyed the activity.  She was able to do well with Nustep, but required cuing for increased pace at times.  She continues to require skilled PT to progress towards goal related activities.    Personal Factors and Comorbidities Past/Current Experience;Time since onset of injury/illness/exacerbation    PT Treatment/Interventions ADLs/Self Care Home Management;Cryotherapy;Electrical Stimulation;Moist Heat;Gait  training;DME Instruction;Neuromuscular re-education;Therapeutic exercise;Therapeutic activities;Functional mobility training;Patient/family education;Manual techniques;Energy conservation;Orthotic Fit/Training;Vestibular;Taping    PT Next Visit Plan Reassess HEP. Reassess TUG and try to get a complete time/score. General strength, fxnal mobility, gait as tolerated. Manual/modalities as needed    Consulted and Agree with Plan of Care Patient;Family member/caregiver    Family Member Consulted Caregiver/CNA Ranay           Patient will benefit from skilled therapeutic intervention in order to improve the following deficits and impairments:  Abnormal gait,Difficulty walking,Impaired UE functional use,Decreased endurance,Decreased balance,Decreased mobility,Decreased strength,Postural dysfunction,Pain,Impaired vision/preception,Impaired tone  Visit Diagnosis: Unsteadiness on feet  Hemiplegia and hemiparesis following cerebral infarction affecting left non-dominant side (HCC)  Muscle weakness (generalized)  Other abnormalities of gait and mobility  Right middle cerebral artery stroke [facility])  Other lack of coordination     Problem List Patient Active Problem List   Diagnosis Date Noted  . Primary osteoarthritis of left knee 08/30/2020  . Controlled type 2 diabetes mellitus with hyperglycemia, without long-term current use of insulin ([place]) 04/06/2020  . Abnormality of gait 02/03/2020  . Benign essential HTN   . CKD (chronic kidney disease), stage II   . Hemiparesis affecting left side as late effect of stroke ([place])   . Labile blood glucose   . Labile blood pressure   . Elevated BUN   . Hyponatremia   . Diabetic peripheral neuropathy ([place])   . Right middle cerebral artery stroke ([place]) 01/14/2020  . Dyslipidemia   . Class 1 obesity due to excess calories with serious comorbidity and body mass index (BMI) of 30.0 to 30.9 in adult   . Left hemiparesis ([place])   . Controlled type 2  diabetes mellitus with hyperglycemia ([place])   . Morbid obesity ([place])   . Leukocytosis   . History of TIA (transient ischemic attack)   . Acute CVA (cerebrovascular accident) ([place]) 01/06/2020  . Acute ischemic stroke ([place]) 01/05/2020  . Hypertensive emergency 01/26/2017  . TIA (transient ischemic attack) 01/26/2017  . Hypothyroidism 01/26/2017  . Aphasia   . Essential hypertension   . Diabetes mellitus without complication ([place])     Juel Burrow, PT, DPT 11/30/2020, 1:32 PM  [place].  [place], [place], [address] Phone: [phone]   Fax:  [phone]  Name: Jill Rose MRN: [id number] Date of Birth: September 09, 1940

## 2020-12-07 ENCOUNTER — Ambulatory Visit: Payer: Medicare Other

## 2020-12-07 ENCOUNTER — Other Ambulatory Visit: Payer: Self-pay

## 2020-12-07 DIAGNOSIS — R278 Other lack of coordination: Secondary | ICD-10-CM

## 2020-12-07 DIAGNOSIS — I63511 Cerebral infarction due to unspecified occlusion or stenosis of right middle cerebral artery: Secondary | ICD-10-CM

## 2020-12-07 DIAGNOSIS — M6281 Muscle weakness (generalized): Secondary | ICD-10-CM

## 2020-12-07 DIAGNOSIS — R2681 Unsteadiness on feet: Secondary | ICD-10-CM

## 2020-12-07 DIAGNOSIS — R2689 Other abnormalities of gait and mobility: Secondary | ICD-10-CM

## 2020-12-07 DIAGNOSIS — I69354 Hemiplegia and hemiparesis following cerebral infarction affecting left non-dominant side: Secondary | ICD-10-CM | POA: Diagnosis not present

## 2020-12-07 NOTE — Therapy (Signed)
[place]. [place], [place], [address] Phone: [phone]   Fax:  [phone]  Physical Therapy Treatment  Patient Details  Name: Jill Rose MRN: [id number] Date of Birth: 02-20-1941 Referring Provider (PT): Posey Pronto, Domenick Bookbinder, MD - PM&R  Garvin Fila, MD - Neuro   Encounter Date: 12/07/2020   PT End of Session - 12/07/20 1440    Visit Number 4    Number of Visits 20    Date for PT Re-Evaluation 02/08/21    Authorization Type UHC MCR    PT Start Time 1440    PT Stop Time 1525    PT Time Calculation (min) 45 min    Equipment Utilized During Treatment Gait belt    Activity Tolerance Patient tolerated treatment well;Patient limited by fatigue    Behavior During Therapy Flat affect           Past Medical History:  Diagnosis Date  . Diabetes mellitus without complication ([place])   . Essential hypertension   . High cholesterol   . Hypothyroidism   . Stroke ([place])   . TIA (transient ischemic attack) 2018    History reviewed. No pertinent surgical history.  There were no vitals filed for this visit.   Subjective Assessment - 12/07/20 1449    Subjective Pt states that she is doing okay. per CNA had a hearing test, no falls, doing exercises at home    Patient Stated Goals to get back to walking, get stronger    Currently in Pain? No/denies                             [facility] Adult PT Treatment/Exercise - 12/07/20 0001      Transfers   Comments stand pivot with FWW with incr time, min A for pivot into sitting. Attempted with PT assist only and pt with significant difficulty sequencing steps, a bit imporved with FWW BUE support      Therapeutic Activites    Therapeutic Activities Other Therapeutic Activities    Other Therapeutic Activities In parallel bars:  Standing, ambulating down length of parallel bars.  Side stepping down parallel bars and back x1 lap.   cuing for larger step  length, encouragement     Lumbar Exercises: Aerobic   Nustep L3 x6 min      Lumbar Exercises: Seated   Sit to Stand 10 reps    bars, 2 sets     Knee/Hip Exercises: Standing   Other Standing Knee Exercises March unilateral 5 x 2 BLE (one leg at a time, BUE on  bars      Knee/Hip Exercises: Seated   Long Arc Quad Both;Strengthening;2 sets;10 reps    Long Arc Quad Weight 2 lbs.    Ball Squeeze 2 x 10 green ball    Clamshell with TheraBand Yellow   2x10   Marching Strengthening;2 sets;10 reps    Marching Limitations 2    Hamstring Curl Both;2 sets;10 reps;Strengthening    Hamstring Limitations yellow Tband    Abduction/Adduction  Both;2 sets;10 reps;Strengthening    Abd/Adduction Limitations red TB                    PT Short Term Goals - 11/30/20 1330      PT SHORT TERM GOAL #1   Title Independent with initial HEP with caregiver assistance    Status Partially Met  PT Long Term Goals - 11/29/20 1722      PT LONG TERM GOAL #1   Title Pt will be independent with final HEP in order to build upon functional gains made in therapy.    Time 10    Period Weeks      PT LONG TERM GOAL #2   Title Attain full TUG time and decrease TUG time by at least 50%  with RW to demo decr fall risk    Baseline Initial assessed 11/29/20: 5:03 minutes with FWW, CGA, WC follow and mod to max cues and encouragement to advance/walk forward    Time 10    Period Weeks      PT LONG TERM GOAL #3   Title Pt will ambulate at least 31' consecutively with RW with min guard in order to demo improved household mobility.    Time 10    Period Weeks      PT LONG TERM GOAL #4   Title Pt will decr 5x sit <> stand time to 40 seconds or less from mat table with UE support in order to demo improved functional BLE strength.    Time 10    Period Weeks                 Plan - 12/07/20 1534    Clinical Impression Statement Pt did well with exercises today. Continues to need cues  for steps but overall with limited SLS which is limiting this as well. She continues to demonstrate decrfar with uprigtht activities in  bars - difficulty with side stepping (slightly improved with some facilitateion for WS but fatigued). We were able to do higher reps of sit to stands in  bars nicely with imporved eccentric control, some cues for foot placement symmetry, anterior WS toward midline/slightly left instead of over right side.    Personal Factors and Comorbidities Past/Current Experience;Time since onset of injury/illness/exacerbation    PT Treatment/Interventions ADLs/Self Care Home Management;Cryotherapy;Electrical Stimulation;Moist Heat;Gait training;DME Instruction;Neuromuscular re-education;Therapeutic exercise;Therapeutic activities;Functional mobility training;Patient/family education;Manual techniques;Energy conservation;Orthotic Fit/Training;Vestibular;Taping    PT Next Visit Plan Reassess HEP. General strength, fxnal mobility, gait as tolerated. Manual/modalities as needed    PT Home Exercise Plan Discussed with [place] doing her seated exercises when she is on her own and not getting up as she does with CNA. Also discussed that it is most appropriate to use FWW instead of a rollator at home d/t stability needed for step taking, safety    Consulted and Agree with Plan of Care Patient;Family member/caregiver    Family Member Consulted Caregiver/CNA [place]           Patient will benefit from skilled therapeutic intervention in order to improve the following deficits and impairments:  Abnormal gait,Difficulty walking,Impaired UE functional use,Decreased endurance,Decreased balance,Decreased mobility,Decreased strength,Postural dysfunction,Pain,Impaired vision/preception,Impaired tone  Visit Diagnosis: Unsteadiness on feet  Hemiplegia and hemiparesis following cerebral infarction affecting left non-dominant side (HCC)  Muscle weakness (generalized)  Other abnormalities of  gait and mobility  Right middle cerebral artery stroke [facility])  Other lack of coordination     Problem List Patient Active Problem List   Diagnosis Date Noted  . Primary osteoarthritis of left knee 08/30/2020  . Controlled type 2 diabetes mellitus with hyperglycemia, without long-term current use of insulin ([place]) 04/06/2020  . Abnormality of gait 02/03/2020  . Benign essential HTN   . CKD (chronic kidney disease), stage II   . Hemiparesis affecting left side as late effect of stroke ([place])   .  Labile blood glucose   . Labile blood pressure   . Elevated BUN   . Hyponatremia   . Diabetic peripheral neuropathy ([place])   . 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 Dyslipidemia   . Class 1 obesity due to excess calories with serious comorbidity and body mass index (BMI) of 30.0 to 30.9 in adult   . Left hemiparesis ([place])   . Controlled type 2 diabetes mellitus with hyperglycemia ([place])   . Morbid obesity ([place])   . Leukocytosis   . History of TIA (transient ischemic attack)   . Acute CVA (cerebrovascular accident) ([place]) 01/06/2020  . Acute ischemic stroke ([place]) 01/05/2020  . Hypertensive emergency 01/26/2017  . TIA (transient ischemic attack) 01/26/2017  . Hypothyroidism 01/26/2017  . Aphasia   . Essential hypertension   . Diabetes mellitus without complication ([place])     Hall Busing, PT, DPT 12/07/2020, 3:41 PM  [place]. [place], [place], [address] Phone: [phone]   Fax:  [phone]  Name: Jill Rose MRN: [id number] Date of Birth: 1941-03-13

## 2020-12-10 ENCOUNTER — Ambulatory Visit: Payer: Medicare Other | Admitting: Rehabilitative and Restorative Service Providers"

## 2020-12-14 ENCOUNTER — Encounter: Payer: Self-pay | Admitting: Physical Medicine & Rehabilitation

## 2020-12-14 ENCOUNTER — Encounter: Payer: Medicare Other | Attending: Physical Medicine & Rehabilitation | Admitting: Physical Medicine & Rehabilitation

## 2020-12-14 ENCOUNTER — Ambulatory Visit: Payer: Medicare Other | Admitting: Physical Therapy

## 2020-12-14 ENCOUNTER — Other Ambulatory Visit: Payer: Self-pay

## 2020-12-14 VITALS — BP 145/79 | HR 91 | Ht 62.0 in | Wt 150.0 lb

## 2020-12-14 DIAGNOSIS — G8194 Hemiplegia, unspecified affecting left nondominant side: Secondary | ICD-10-CM | POA: Diagnosis present

## 2020-12-14 DIAGNOSIS — M1712 Unilateral primary osteoarthritis, left knee: Secondary | ICD-10-CM | POA: Diagnosis present

## 2020-12-14 DIAGNOSIS — R269 Unspecified abnormalities of gait and mobility: Secondary | ICD-10-CM | POA: Diagnosis present

## 2020-12-14 DIAGNOSIS — I10 Essential (primary) hypertension: Secondary | ICD-10-CM | POA: Insufficient documentation

## 2020-12-14 DIAGNOSIS — I63511 Cerebral infarction due to unspecified occlusion or stenosis of right middle cerebral artery: Secondary | ICD-10-CM | POA: Diagnosis present

## 2020-12-14 NOTE — Progress Notes (Signed)
Subjective:    Patient ID: Jill Rose, adult    DOB: 1940/10/14, 80 y.o.   MRN: [id number]  HPI Right-handed adult with history of diet-controlled diabetes mellitus, hypertension, hypothyroidism, TIA presents for follow up for right MCA infarct.  Last clinic visit on 10/11/20.  Caregiver supplements history. Since that time, pt states she is back in therapies. BP is relatively controlled. She still is not checking CBGs. Denies falls. Using walker and wheelchair. She had steroid injections in b/l knees.   Pain Inventory Average Pain 0 Pain Right Now 0 My pain is intermittent and aching  In the last 24 hours, has pain interfered with the following? General activity 0 Relation with others 10 Enjoyment of life 10 What TIME of day is your pain at its worst? morning Sleep (in general) Good  Pain is worse with: walking Pain improves with: rest, heat/ice and medication Relief from Meds: 10  Family History  Problem Relation Age of Onset  . Heart disease Mother    Social History   Socioeconomic History  . Marital status: Married    Spouse name: Jonny Ruiz  . Number of children: Not on file  . Years of education: Not on file  . Highest education level: Not on file  Occupational History  . Not on file  Tobacco Use  . Smoking status: Never Smoker  . Smokeless tobacco: Never Used  Vaping Use  . Vaping Use: Never used  Substance and Sexual Activity  . Alcohol use: No  . Drug use: No  . Sexual activity: Not on file  Other Topics Concern  . Not on file  Social History Narrative   Lives with spouse   Right Handed   Drinks 1 cup caffeine/daily   Social Determinants of Health   Financial Resource Strain: Not on file  Food Insecurity: Not on file  Transportation Needs: Not on file  Physical Activity: Not on file  Stress: Not on file  Social Connections: Not on file   No past surgical history on file. Past Medical History:  Diagnosis Date  . Diabetes mellitus without  complication (HCC)   . Essential hypertension   . High cholesterol   . Hypothyroidism   . Stroke (HCC)   . TIA (transient ischemic attack) 2018   BP (!) 145/79   Pulse 91   Ht 5\' 2"  (1.575 m)   Wt 150 lb (68 kg) Comment: last recorded  SpO2 96%   BMI 27.44 kg/m   Opioid Risk Score:   Fall Risk Score:  `1  Depression screen PHQ 2/9  Depression screen [facility] 2/9 12/14/2020 10/11/2020 05/31/2020 02/03/2020  Decreased Interest 0 0 0 0  Down, Depressed, Hopeless 0 0 0 2  PHQ - 2 Score 0 0 0 2  Altered sleeping - - - 0  Tired, decreased energy - - - 3  Change in appetite - - - 0  Feeling bad or failure about yourself  - - - 0  Trouble concentrating - - - 0  Moving slowly or fidgety/restless - - - 1  Suicidal thoughts - - - 0  PHQ-9 Score - - - 6   Review of Systems  Constitutional: Negative.   HENT: Negative.   Eyes: Negative.   Respiratory: Negative.   Cardiovascular: Negative.   Gastrointestinal: Negative.   Endocrine: Negative.   Genitourinary: Negative.   Musculoskeletal: Positive for arthralgias, gait problem and myalgias.       Left knee pain  Skin: Negative.   Allergic/Immunologic:  Negative.   Neurological: Positive for weakness.  Hematological:       Plavix  Psychiatric/Behavioral: Positive for confusion and dysphoric mood.  All other systems reviewed and are negative.     Objective:   Physical Exam  Constitutional: No distress . Vital signs reviewed. HENT: Normocephalic.  Atraumatic. Eyes: EOMI. No discharge. Cardiovascular: No JVD.   Respiratory: Normal effort.  No stridor.   GI: Non-distended.   Skin: Warm and dry.   Skin breakdown at socks Psych: Flat. Delayed. Musc: No edema in extremities.  No tenderness in extremities. Neuro: Alert HOH Motor:  LUE: 4+/5 proximal to distal LLE: 4+/5 proximal to distal LUE: Mild ataxia, Improving    Assessment & Plan:  Right-handed adult with history of diet-controlled diabetes mellitus, hypertension,  hypothyroidism, TIA presents for follow up for right MCA infarct.  1. Left side hemiparesis secondary to right MCA infarction is felt to be thromboembolic secondary to small and large vessel disease  Continue therapies  Continue follow up with Neuro  Caregiver with patient majority of the time  2. Hypertension.    Continue meds  Relatively controlled today   3. 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  Continue meds  Encouraged checking CBGs again x5  4.  Gait abnormality  Continue therapies  Continue walker/wheelchiar for safety, only using wheelchair in community  Avoid treadmill at present  5. Depression  Plans to follow up with PCP, caregiver states not an issue  6. Likely left knee OA  Getting worse again, following up with Ortho, had steroid injections without benefit  7. Skin breakdown  Avoid socks that are causing pressure

## 2020-12-17 ENCOUNTER — Ambulatory Visit: Payer: Medicare Other | Admitting: Rehabilitative and Restorative Service Providers"

## 2020-12-20 ENCOUNTER — Ambulatory Visit: Payer: Medicare Other | Attending: Physical Medicine & Rehabilitation

## 2020-12-20 ENCOUNTER — Ambulatory Visit: Payer: Medicare Other | Admitting: Physical Medicine & Rehabilitation

## 2020-12-20 ENCOUNTER — Other Ambulatory Visit: Payer: Self-pay

## 2020-12-20 DIAGNOSIS — I69354 Hemiplegia and hemiparesis following cerebral infarction affecting left non-dominant side: Secondary | ICD-10-CM

## 2020-12-20 DIAGNOSIS — I63511 Cerebral infarction due to unspecified occlusion or stenosis of right middle cerebral artery: Secondary | ICD-10-CM | POA: Diagnosis present

## 2020-12-20 DIAGNOSIS — R278 Other lack of coordination: Secondary | ICD-10-CM

## 2020-12-20 DIAGNOSIS — R2689 Other abnormalities of gait and mobility: Secondary | ICD-10-CM | POA: Diagnosis present

## 2020-12-20 DIAGNOSIS — R2681 Unsteadiness on feet: Secondary | ICD-10-CM | POA: Insufficient documentation

## 2020-12-20 DIAGNOSIS — M6281 Muscle weakness (generalized): Secondary | ICD-10-CM | POA: Diagnosis present

## 2020-12-20 NOTE — Therapy (Signed)
[place]. [place], [place], [address] Phone: [phone]   Fax:  [phone]  Physical Therapy Treatment  Patient Details  Name: Jill Rose MRN: [id number] Date of Birth: June 19, 1941 Referring Provider (PT): Jill Pronto, Domenick Bookbinder, MD - PM&R  Jill Fila, MD - Neuro   Encounter Date: 12/20/2020   PT End of Session - 12/20/20 1529    Visit Number 5    Number of Visits 20    Date for PT Re-Evaluation 02/08/21    Authorization Type UHC MCR    PT Start Time 0350    PT Stop Time 1600    PT Time Calculation (min) 42 min    Equipment Utilized During Treatment Gait belt    Activity Tolerance Patient tolerated treatment well;Patient limited by fatigue    Behavior During Therapy Flat affect           Past Medical History:  Diagnosis Date  . Diabetes mellitus without complication ([place])   . Essential hypertension   . High cholesterol   . Hypothyroidism   . Stroke ([place])   . TIA (transient ischemic attack) 2018    History reviewed. No pertinent surgical history.  There were no vitals filed for this visit.   Subjective Assessment - 12/20/20 1528    Subjective Doing okay. had some food poisoning/upset stomach from food last friday so hasnt done many exercises    Patient Stated Goals to get back to walking, get stronger    Currently in Pain? No/denies                             [facility] Adult PT Treatment/Exercise - 12/20/20 0001      Transfers   Comments Stand pivot t/fs, short distnace ambulatory transfers.      Therapeutic Activites    Other Therapeutic Activities In parallel bars:  Standing marches B alternating x 5, x 10. steps forward/backward. Alt side toe taps x9 B.      Lumbar Exercises: Aerobic   Nustep L3 x6 min      Lumbar Exercises: Seated   Sit to Stand 10 reps    Sit to Stand Limitations  bars, BUE assist and CGA from PT - only 1 cue required for eccentric /slow  controlled motion                    PT Short Term Goals - 11/30/20 1330      PT SHORT TERM GOAL #1   Title Independent with initial HEP with caregiver assistance    Status Partially Met             PT Long Term Goals - 11/29/20 1722      PT LONG TERM GOAL #1   Title Pt will be independent with final HEP in order to build upon functional gains made in therapy.    Time 10    Period Weeks      PT LONG TERM GOAL #2   Title Attain full TUG time and decrease TUG time by at least 50%  with RW to demo decr fall risk    Baseline Initial assessed 11/29/20: 5:03 minutes with FWW, CGA, WC follow and mod to max cues and encouragement to advance/walk forward    Time 10    Period Weeks      PT LONG TERM GOAL #3   Title Pt will ambulate at least  46' consecutively with RW with min guard in order to demo improved household mobility.    Time 10    Period Weeks      PT LONG TERM GOAL #4   Title Pt will decr 5x sit <> stand time to 40 seconds or less from mat table with UE support in order to demo improved functional BLE strength.    Time 10    Period Weeks                 Plan - 12/20/20 1737    Clinical Impression Statement Jill Rose tolerated todays session fairly, limited primarily by fatigue. Introduced some standing strength/balance in  bars and she needed LLE blocked and min A for WS left to help with right foot clearance. Trendelenberg noted with LLE stance which is limiting RLE clearance. Educated further on importance of consistency with HEP seated exercises when she is on her own    Personal Factors and Comorbidities Past/Current Experience;Time since onset of injury/illness/exacerbation    PT Treatment/Interventions ADLs/Self Care Home Management;Cryotherapy;Electrical Stimulation;Moist Heat;Gait training;DME Instruction;Neuromuscular re-education;Therapeutic exercise;Therapeutic activities;Functional mobility training;Patient/family education;Manual techniques;Energy  conservation;Orthotic Fit/Training;Vestibular;Taping    PT Next Visit Plan Reassess HEP. General strength, fxnal mobility, gait as tolerated. Manual/modalities as needed    PT Home Exercise Plan Discussed with Jill Rose doing her seated exercises when she is on her own and not getting up as she does with CNA. Also discussed that it is most appropriate to use FWW instead of a rollator at home d/t stability needed for step taking, safety    Consulted and Agree with Plan of Care Patient;Family member/caregiver    Family Member Consulted Caregiver/CNA Jill Rose           Patient will benefit from skilled therapeutic intervention in order to improve the following deficits and impairments:  Abnormal gait,Difficulty walking,Impaired UE functional use,Decreased endurance,Decreased balance,Decreased mobility,Decreased strength,Postural dysfunction,Pain,Impaired vision/preception,Impaired tone  Visit Diagnosis: Hemiplegia and hemiparesis following cerebral infarction affecting left non-dominant side (HCC)  Muscle weakness (generalized)  Other abnormalities of gait and mobility  Right middle cerebral artery stroke [facility])  Other lack of coordination     Problem List Patient Active Problem List   Diagnosis Date Noted  . Primary osteoarthritis of left knee 08/30/2020  . Controlled type 2 diabetes mellitus with hyperglycemia, without long-term current use of insulin ([place]) 04/06/2020  . Abnormality of gait 02/03/2020  . Benign essential HTN   . CKD (chronic kidney disease), stage II   . Hemiparesis affecting left side as late effect of stroke ([place])   . Labile blood glucose   . Labile blood pressure   . Elevated BUN   . Hyponatremia   . Diabetic peripheral neuropathy ([place])   . 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 Dyslipidemia   . Class 1 obesity due to excess calories with serious comorbidity and body mass index (BMI) of 30.0 to 30.9 in adult   . Left hemiparesis ([place])   .  Controlled type 2 diabetes mellitus with hyperglycemia ([place])   . Morbid obesity ([place])   . Leukocytosis   . History of TIA (transient ischemic attack)   . Acute CVA (cerebrovascular accident) ([place]) 01/06/2020  . Acute ischemic stroke ([place]) 01/05/2020  . Hypertensive emergency 01/26/2017  . TIA (transient ischemic attack) 01/26/2017  . Hypothyroidism 01/26/2017  . Aphasia   . Essential hypertension   . Diabetes mellitus without complication ([place])     Curley Hogen L Keshonda Monsour, PT, DPT 12/20/2020, 5:39 PM  [facility]  [address] [place], [place], [address] Phone: [phone]   Fax:  [phone]  Name: Jill Rose MRN: [id number] Date of Birth: 18-Aug-1940

## 2020-12-21 ENCOUNTER — Ambulatory Visit: Payer: Medicare Other

## 2020-12-21 DIAGNOSIS — R2681 Unsteadiness on feet: Secondary | ICD-10-CM

## 2020-12-21 DIAGNOSIS — R278 Other lack of coordination: Secondary | ICD-10-CM

## 2020-12-21 DIAGNOSIS — R2689 Other abnormalities of gait and mobility: Secondary | ICD-10-CM

## 2020-12-21 DIAGNOSIS — I69354 Hemiplegia and hemiparesis following cerebral infarction affecting left non-dominant side: Secondary | ICD-10-CM | POA: Diagnosis not present

## 2020-12-21 DIAGNOSIS — I63511 Cerebral infarction due to unspecified occlusion or stenosis of right middle cerebral artery: Secondary | ICD-10-CM

## 2020-12-21 DIAGNOSIS — M6281 Muscle weakness (generalized): Secondary | ICD-10-CM

## 2020-12-21 NOTE — Therapy (Signed)
[facility] [address] [place], [place], [address] Phone: [phone]   Fax:  [phone]  Physical Therapy Treatment  Patient Details  Name: Jill Rose MRN: [id number] Date of Birth: 08/10/1940 Referring Provider (PT): Patel, Ankit Anil, MD - PM&R  Sethi, Pramod S, MD - Neuro   Encounter Date: 12/21/2020   PT End of Session - 12/21/20 1512    Visit Number 6    Number of Visits 20    Date for PT Re-Evaluation 02/08/21    Authorization Type UHC MCR    PT Start Time 1513    PT Stop Time 1553    PT Time Calculation (min) 40 min    Equipment Utilized During Treatment Gait belt    Activity Tolerance Patient tolerated treatment well;Patient limited by fatigue    Behavior During Therapy Flat affect           Past Medical History:  Diagnosis Date  . Diabetes mellitus without complication (HCC)   . Essential hypertension   . High cholesterol   . Hypothyroidism   . Stroke (HCC)   . TIA (transient ischemic attack) 2018    No past surgical history on file.  There were no vitals filed for this visit.                      [facility] Adult PT Treatment/Exercise - 12/21/20 0001      Transfers   Comments Stand pivot t/fs, short distnace ambulatory transfers with FWW and CGA/min A and cues for stepping, foot advancement.      Therapeutic Activites    Other Therapeutic Activities In parallel bars:  Standing marches B alternating 5x3. walking forward & backward x 5 reps with seated rest between first 2 sets Alt side toe taps 5 x 3 on each leg.   BUE supported.     Lumbar Exercises: Aerobic   Nustep L3 x6 min      Lumbar Exercises: Seated   Sit to Stand 20 reps   25 to 30 sec rest between sets   Sit to Stand Limitations  bars, BUE assist and CS from PT - no cues required today for slow controlled motion           ball squeezes x 30         PT Short Term Goals - 11/30/20 1330      PT SHORT TERM  GOAL #1   Title Independent with initial HEP with caregiver assistance    Status Partially Met             PT Long Term Goals - 11/29/20 1722      PT LONG TERM GOAL #1   Title Pt will be independent with final HEP in order to build upon functional gains made in therapy.    Time 10    Period Weeks      PT LONG TERM GOAL #2   Title Attain full TUG time and decrease TUG time by at least 50%  with RW to demo decr fall risk    Baseline Initial assessed 11/29/20: 5:03 minutes with FWW, CGA, WC follow and mod to max cues and encouragement to advance/walk forward    Time 10    Period Weeks      PT LONG TERM GOAL #3   Title Pt will ambulate at least 50' consecutively with RW with min guard in order to demo improved household mobility.      Time 10    Period Weeks      PT LONG TERM GOAL #4   Title Pt will decr 5x sit <> stand time to 40 seconds or less from mat table with UE support in order to demo improved functional BLE strength.    Time 10    Period Weeks                 Plan - 12/21/20 1521    Clinical Impression Statement Less fatigue today and better step length, fluiidity on the right with transfers to nustep at start of session and with standing exercises. Continued with standing strength/balance in  bars and she needed LLE blocked laterally, it is noted left knee does seem to move slightly into varus but no pain reported. With trials of backwards walking she does assume a more forward flexed posture and needs frequent cueing for upright posture. She is very heavily dependent on UE support at this time, plan to gradually work on trying 1 UE support when appropriate for standing activities.    Personal Factors and Comorbidities Past/Current Experience;Time since onset of injury/illness/exacerbation    PT Treatment/Interventions ADLs/Self Care Home Management;Cryotherapy;Electrical Stimulation;Moist Heat;Gait training;DME Instruction;Neuromuscular re-education;Therapeutic  exercise;Therapeutic activities;Functional mobility training;Patient/family education;Manual techniques;Energy conservation;Orthotic Fit/Training;Vestibular;Taping    PT Next Visit Plan Reassess HEP. General strength, fxnal mobility, gait as tolerated. Manual/modalities as needed    PT Home Exercise Plan Discussed with Jill Rose doing her seated exercises when she is on her own and not getting up as she does with CNA. Also discussed that it is most appropriate to use FWW instead of a rollator at home d/t stability needed for step taking, safety    Consulted and Agree with Plan of Care Patient;Family member/caregiver    Family Member Consulted Caregiver/CNA Jill Rose           Patient will benefit from skilled therapeutic intervention in order to improve the following deficits and impairments:  Abnormal gait,Difficulty walking,Impaired UE functional use,Decreased endurance,Decreased balance,Decreased mobility,Decreased strength,Postural dysfunction,Pain,Impaired vision/preception,Impaired tone  Visit Diagnosis: Muscle weakness (generalized)  Other abnormalities of gait and mobility  Right middle cerebral artery stroke (HCC)  Other lack of coordination  Unsteadiness on feet     Problem List Patient Active Problem List   Diagnosis Date Noted  . Primary osteoarthritis of left knee 08/30/2020  . Controlled type 2 diabetes mellitus with hyperglycemia, without long-term current use of insulin ([place]) 04/06/2020  . Abnormality of gait 02/03/2020  . Benign essential HTN   . CKD (chronic kidney disease), stage II   . Hemiparesis affecting left side as late effect of stroke ([place])   . Labile blood glucose   . Labile blood pressure   . Elevated BUN   . Hyponatremia   . Diabetic peripheral neuropathy ([place])   . Right middle cerebral artery stroke ([place]) 01/14/2020  . Dyslipidemia   . Class 1 obesity due to excess calories with serious comorbidity and body mass index (BMI) of 30.0 to 30.9 in adult    . Left hemiparesis ([place])   . Controlled type 2 diabetes mellitus with hyperglycemia ([place])   . Morbid obesity ([place])   . Leukocytosis   . History of TIA (transient ischemic attack)   . Acute CVA (cerebrovascular accident) ([place]) 01/06/2020  . Acute ischemic stroke ([place]) 01/05/2020  . Hypertensive emergency 01/26/2017  . TIA (transient ischemic attack) 01/26/2017  . Hypothyroidism 01/26/2017  . Aphasia   . Essential hypertension   . Diabetes mellitus without complication ([place])  Hall Busing, PT, DPT 12/21/2020, 4:01 PM  [place] [place], [place], [address] Phone: [phone]   Fax:  [phone]  Name: Jill Rose MRN: [id number] Date of Birth: 03/17/1941

## 2020-12-28 ENCOUNTER — Other Ambulatory Visit: Payer: Self-pay

## 2020-12-28 ENCOUNTER — Ambulatory Visit: Payer: Medicare Other

## 2020-12-28 DIAGNOSIS — I63511 Cerebral infarction due to unspecified occlusion or stenosis of right middle cerebral artery: Secondary | ICD-10-CM

## 2020-12-28 DIAGNOSIS — R2681 Unsteadiness on feet: Secondary | ICD-10-CM

## 2020-12-28 DIAGNOSIS — M6281 Muscle weakness (generalized): Secondary | ICD-10-CM

## 2020-12-28 DIAGNOSIS — R278 Other lack of coordination: Secondary | ICD-10-CM

## 2020-12-28 DIAGNOSIS — I69354 Hemiplegia and hemiparesis following cerebral infarction affecting left non-dominant side: Secondary | ICD-10-CM | POA: Diagnosis not present

## 2020-12-28 NOTE — Therapy (Signed)
[place], [place], [address] Phone: [phone]   Fax:  [phone]  Physical Therapy Treatment  Patient Details  Name: Jill Rose MRN: [id number] Date of Birth: 03-May-1941 Referring Provider (PT): Jamse Arn, MD - PM&R  Garvin Fila, MD - Neuro   Encounter Date: 12/28/2020   PT End of Session - 12/28/20 1834     Visit Number 7    Number of Visits 20    Date for PT Re-Evaluation 02/08/21    Authorization Type UHC MCR    PT Start Time 5093    PT Stop Time 1855    PT Time Calculation (min) 41 min    Equipment Utilized During Treatment Gait belt    Activity Tolerance Patient tolerated treatment well;Patient limited by fatigue    Behavior During Therapy Flat affect             Past Medical History:  Diagnosis Date   Diabetes mellitus without complication ([place])    Essential hypertension    High cholesterol    Hypothyroidism    Stroke [facility])    TIA (transient ischemic attack) 2018    History reviewed. No pertinent surgical history.  There were no vitals filed for this visit.   Subjective Assessment - 12/28/20 1818     Subjective doing okay    Patient Stated Goals to get back to walking, get stronger    Currently in Pain? No/denies                               [facility] Adult PT Treatment/Exercise - 12/28/20 0001       Transfers   Comments Stand pivot t/fs to/from WC with cues for setup, short distnace ambulatory transfers with FWW and CGA/min A and cues for stepping, foot advancement.      Therapeutic Activites    Other Therapeutic Activities In parallel bars:  Standing marches B 10 x 2. walking forward & backward x 3, x 2  reps with seated rest between first 2 sets. Alt side toe taps 10 x 2 on each leg.      Lumbar Exercises: Seated   Sit to Stand --   x 30 STS 1 minute rest between sets   Sit to Stand Limitations  bars, BUE assist and CS from PT  forfirst set, 2nd 2 sets from elevated mat table BUE support on chair.                      PT Short Term Goals - 11/30/20 1330       PT SHORT TERM GOAL #1   Title Independent with initial HEP with caregiver assistance    Status Partially Met               PT Long Term Goals - 11/29/20 1722       PT LONG TERM GOAL #1   Title Pt will be independent with final HEP in order to build upon functional gains made in therapy.    Time 10    Period Weeks      PT LONG TERM GOAL #2   Title Attain full TUG time and decrease TUG time by at least 50%  with RW to demo decr fall risk    Baseline Initial assessed 11/29/20: 5:03 minutes with FWW, CGA, WC follow and mod to max cues and encouragement to  advance/walk forward    Time 10    Period Weeks      PT LONG TERM GOAL #3   Title Pt will ambulate at least 19' consecutively with RW with min guard in order to demo improved household mobility.    Time 10    Period Weeks      PT LONG TERM GOAL #4   Title Pt will decr 5x sit <> stand time to 40 seconds or less from mat table with UE support in order to demo improved functional BLE strength.    Time 10    Period Weeks                   Plan - 12/28/20 1900     Clinical Impression Statement Lue tolerated exercise progressions well today with 1 minute rests in between. Continued trendelenberg on the left so Pt provided alot of guarding/blocking to faciltiate alignment with exercises. She is very UE domiant with Sit to stands in  bars but unable to complete with hands pushing on thighs. Transitioned to hi-lo table with mat slightly elevated and hands on back of chair to encourage less pulling with the arms to stand with good tolerance. Cues needed for WS left    Personal Factors and Comorbidities Past/Current Experience;Time since onset of injury/illness/exacerbation    PT Treatment/Interventions ADLs/Self Care Home Management;Cryotherapy;Electrical Stimulation;Moist  Heat;Gait training;DME Instruction;Neuromuscular re-education;Therapeutic exercise;Therapeutic activities;Functional mobility training;Patient/family education;Manual techniques;Energy conservation;Orthotic Fit/Training;Vestibular;Taping    PT Next Visit Plan Reassess HEP. General strength, fxnal mobility, gait as tolerated. Manual/modalities as needed    PT Home Exercise Plan Discussed with [place] doing her seated exercises when she is on her own and not getting up as she does with CNA. Also discussed that it is most appropriate to use FWW instead of a rollator at home d/t stability needed for step taking, safety    Consulted and Agree with Plan of Care Patient;Family member/caregiver    Family Member Consulted Caregiver/CNA [place]             Patient will benefit from skilled therapeutic intervention in order to improve the following deficits and impairments:  Abnormal gait, Difficulty walking, Impaired UE functional use, Decreased endurance, Decreased balance, Decreased mobility, Decreased strength, Postural dysfunction, Pain, Impaired vision/preception, Impaired tone  Visit Diagnosis: Muscle weakness (generalized)  Right middle cerebral artery stroke (HCC)  Unsteadiness on feet  Other lack of coordination     Problem List Patient Active Problem List   Diagnosis Date Noted   Primary osteoarthritis of left knee 08/30/2020   Controlled type 2 diabetes mellitus with hyperglycemia, without long-term current use of insulin ([place]) 04/06/2020   Abnormality of gait 02/03/2020   Benign essential HTN    CKD (chronic kidney disease), stage II    Hemiparesis affecting left side as late effect of stroke ([place])    Labile blood glucose    Labile blood pressure    Elevated BUN    Hyponatremia    Diabetic peripheral neuropathy (HCC)    Right middle cerebral artery stroke ([place]) 01/14/2020   Dyslipidemia    Class 1 obesity due to excess calories with serious comorbidity and body mass index  (BMI) of 30.0 to 30.9 in adult    Left hemiparesis (HCC)    Controlled type 2 diabetes mellitus with hyperglycemia (HCC)    Morbid obesity (HCC)    Leukocytosis    History of TIA (transient ischemic attack)    Acute CVA (cerebrovascular accident) ([place]) 01/06/2020  Acute ischemic stroke ([place]) 01/05/2020   Hypertensive emergency 01/26/2017   TIA (transient ischemic attack) 01/26/2017   Hypothyroidism 01/26/2017   Aphasia    Essential hypertension    Diabetes mellitus without complication ([place])     Hall Busing, PT, DPT 12/28/2020, 7:04 PM  [place]. [place], [place], [address] Phone: [phone]   Fax:  [phone]  Name: MARYSOL WELLNITZ MRN: [id number] Date of Birth: 1940/10/14

## 2021-01-07 ENCOUNTER — Ambulatory Visit: Payer: Medicare Other

## 2021-01-11 ENCOUNTER — Telehealth: Payer: Self-pay

## 2021-01-11 ENCOUNTER — Ambulatory Visit: Payer: Medicare Other

## 2021-01-11 NOTE — Telephone Encounter (Signed)
Primary PT attempted to contact pt/family using phone numbers on file. Unable to reach, all voicemails full. Today is the patient's second no showed appointment.    Anson Crofts, PT, DPT

## 2021-01-18 ENCOUNTER — Ambulatory Visit: Payer: Medicare Other | Attending: Physical Medicine & Rehabilitation

## 2021-01-18 DIAGNOSIS — R2689 Other abnormalities of gait and mobility: Secondary | ICD-10-CM | POA: Insufficient documentation

## 2021-01-18 DIAGNOSIS — I63511 Cerebral infarction due to unspecified occlusion or stenosis of right middle cerebral artery: Secondary | ICD-10-CM | POA: Insufficient documentation

## 2021-01-18 DIAGNOSIS — I69354 Hemiplegia and hemiparesis following cerebral infarction affecting left non-dominant side: Secondary | ICD-10-CM | POA: Insufficient documentation

## 2021-01-18 DIAGNOSIS — M6281 Muscle weakness (generalized): Secondary | ICD-10-CM | POA: Insufficient documentation

## 2021-01-18 DIAGNOSIS — R2681 Unsteadiness on feet: Secondary | ICD-10-CM | POA: Insufficient documentation

## 2021-01-18 DIAGNOSIS — R278 Other lack of coordination: Secondary | ICD-10-CM | POA: Insufficient documentation

## 2021-02-07 ENCOUNTER — Other Ambulatory Visit: Payer: Self-pay

## 2021-02-07 ENCOUNTER — Encounter: Payer: Self-pay | Admitting: Rehabilitative and Restorative Service Providers"

## 2021-02-07 ENCOUNTER — Ambulatory Visit: Payer: Medicare Other | Admitting: Rehabilitative and Restorative Service Providers"

## 2021-02-07 DIAGNOSIS — R2689 Other abnormalities of gait and mobility: Secondary | ICD-10-CM

## 2021-02-07 DIAGNOSIS — R2681 Unsteadiness on feet: Secondary | ICD-10-CM

## 2021-02-07 DIAGNOSIS — I69354 Hemiplegia and hemiparesis following cerebral infarction affecting left non-dominant side: Secondary | ICD-10-CM | POA: Diagnosis present

## 2021-02-07 DIAGNOSIS — M6281 Muscle weakness (generalized): Secondary | ICD-10-CM

## 2021-02-07 DIAGNOSIS — I63511 Cerebral infarction due to unspecified occlusion or stenosis of right middle cerebral artery: Secondary | ICD-10-CM | POA: Diagnosis present

## 2021-02-07 DIAGNOSIS — R278 Other lack of coordination: Secondary | ICD-10-CM

## 2021-02-07 NOTE — Addendum Note (Signed)
Addended by: Michael Litter on: 02/07/2021 01:27 PM   Modules accepted: Orders

## 2021-02-07 NOTE — Therapy (Signed)
[facility] [address] [place], [place], [address] Phone: [phone]   Fax:  [phone]  Physical Therapy Treatment  Patient Details  Name: Jill Rose MRN: [id number] Date of Birth: 26-Sep-1940 Referring Provider (PT): Jill Fennel, MD - PM&R  Jill Riley, MD - Neuro   Encounter Date: 02/07/2021   PT End of Session - 02/07/21 1238     Visit Number 8    Number of Visits 20    Date for PT Re-Evaluation 03/11/21    Authorization Type UHC MCR    PT Start Time 1233    PT Stop Time 1315    PT Time Calculation (min) 42 min    Activity Tolerance Patient tolerated treatment well;Patient limited by fatigue    Behavior During Therapy Flat affect             Past Medical History:  Diagnosis Date   Diabetes mellitus without complication (HCC)    Essential hypertension    High cholesterol    Hypothyroidism    Stroke [facility])    TIA (transient ischemic attack) 2018    History reviewed. No pertinent surgical history.  There were no vitals filed for this visit.   Subjective Assessment - 02/07/21 1237     Subjective I have missed coming to therapy.    Patient Stated Goals to get back to walking, get stronger    Currently in Pain? No/denies                [facility] PT Assessment - 02/07/21 0001       Assessment   Medical Diagnosis I63.511 (ICD-10-CM) - Right middle cerebral artery stroke, G81.94 (ICD-10-CM) - Left hemiparesis, R26.9 (ICD-10-CM) - Abnormality of gait    Referring Provider (PT) Jill Rose, Jill Gottron, MD - PM&R  Jill Riley, MD - Neuro    Hand Dominance Right      Balance Screen   Has the patient fallen in the past 6 months No      Prior Function   Level of Independence Needs assistance with ADLs    Leisure Cookng and watching tv      Observation/Other Assessments   Focus on Therapeutic Outcomes (FOTO)  29%      Strength   Overall Strength Comments LLE strength grossly 3+ to 40minus/5  throughout                           [facility] Adult PT Treatment/Exercise - 02/07/21 0001       Transfers   Five time sit to stand comments  34 sec   in heavy parallel bars with heavy reliance on BUE   Comments Standing in parallel bars x57min 50 sec   cuing for improved posture.  Pt with heavy reliance on UE     Lumbar Exercises: Standing   Other Standing Lumbar Exercises Standing in parallel bars:  marching x10 reps BLE      Lumbar Exercises: Seated   Other Seated Lumbar Exercises Chair push ups x10 reps   with cuing for encouragement to increase effort.     Knee/Hip Exercises: Seated   Long Arc Quad Both;Strengthening;2 sets;10 reps    Long Arc Quad Weight 2 lbs.    Ball Squeeze 2 x 10 green ball    Hamstring Curl Both;2 sets;10 reps;Strengthening    Hamstring Limitations yellow Tband    Abduction/Adduction  Both;2 sets;10 reps;Strengthening   Scissors  Abd/Adduction Limitations 2#                      PT Short Term Goals - 02/07/21 1322       PT SHORT TERM GOAL #1   Title Independent with initial HEP with caregiver assistance    Status Achieved               PT Long Term Goals - 02/07/21 1323       PT LONG TERM GOAL #1   Title Pt will be independent with final HEP in order to build upon functional gains made in therapy.    Status On-going      PT LONG TERM GOAL #2   Title Attain full TUG time and decrease TUG time by at least 50%  with RW to demo decr fall risk    Status Unable to assess      PT LONG TERM GOAL #3   Title Pt will ambulate at least 41' consecutively with RW with min guard in order to demo improved household mobility.    Status On-going      PT LONG TERM GOAL #4   Title Pt will decr 5x sit <> stand time to 40 seconds or less from mat table with UE support in order to demo improved functional BLE strength.    Status On-going                   Plan - 02/07/21 1318     Clinical Impression Statement [name] has missed several visits and has not been seen since 12/28/20, so reassessment performed today. Pt continues to rely heavily on UE duing transfers and to maintain standing.  She was able to tolerate standing for 3 min 50 seconds before she started to get fatigued and had increased forward slumping posture.  She continues with Trendelenburg during standing marching.  Ms Jill Rose would benefit from an additional 2w4 to progress towards goal related activities.    Personal Factors and Comorbidities Past/Current Experience;Time since onset of injury/illness/exacerbation    Stability/Clinical Decision Making Evolving/Moderate complexity    Clinical Decision Making Moderate    Rehab Potential Fair    PT Frequency 2x / week    PT Duration 4 weeks    PT Treatment/Interventions ADLs/Self Care Home Management;Cryotherapy;Electrical Stimulation;Moist Heat;Gait training;DME Instruction;Neuromuscular re-education;Therapeutic exercise;Therapeutic activities;Functional mobility training;Patient/family education;Manual techniques;Energy conservation;Orthotic Fit/Training;Vestibular;Taping;Balance training;Passive range of motion;Dry needling    PT Next Visit Plan Reassess HEP. General strength, fxnal mobility, gait as tolerated. Manual/modalities as needed    Consulted and Agree with Plan of Care Patient;Family member/caregiver    Family Member Consulted Caregiver/CNA Jill Rose             Patient will benefit from skilled therapeutic intervention in order to improve the following deficits and impairments:  Abnormal gait, Difficulty walking, Impaired UE functional use, Decreased endurance, Decreased balance, Decreased mobility, Decreased strength, Postural dysfunction, Pain, Impaired vision/preception, Impaired tone  Visit Diagnosis: Muscle weakness (generalized)  Right middle cerebral artery stroke (HCC)  Unsteadiness on feet  Other lack of coordination  Other abnormalities of gait and  mobility  Hemiplegia and hemiparesis following cerebral infarction affecting left non-dominant side [facility])     Problem List Patient Active Problem List   Diagnosis Date Noted   Primary osteoarthritis of left knee 08/30/2020   Controlled type 2 diabetes mellitus with hyperglycemia, without long-term current use of insulin (HCC) 04/06/2020   Abnormality of gait 02/03/2020  Benign essential HTN    CKD (chronic kidney disease), stage II    Hemiparesis affecting left side as late effect of stroke (HCC)    Labile blood glucose    Labile blood pressure    Elevated BUN    Hyponatremia    Diabetic peripheral neuropathy (HCC)    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   Dyslipidemia    Class 1 obesity due to excess calories with serious comorbidity and body mass index (BMI) of 30.0 to 30.9 in adult    Left hemiparesis (HCC)    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 [place], [place], [address] Phone: [phone]   Fax:  [phone]  Name: Jill Rose MRN: [id number] Date of Birth: [birth year]

## 2021-02-08 ENCOUNTER — Ambulatory Visit: Payer: Medicare Other

## 2021-02-08 DIAGNOSIS — R2681 Unsteadiness on feet: Secondary | ICD-10-CM

## 2021-02-08 DIAGNOSIS — M6281 Muscle weakness (generalized): Secondary | ICD-10-CM

## 2021-02-08 DIAGNOSIS — I69354 Hemiplegia and hemiparesis following cerebral infarction affecting left non-dominant side: Secondary | ICD-10-CM

## 2021-02-08 DIAGNOSIS — R278 Other lack of coordination: Secondary | ICD-10-CM

## 2021-02-08 DIAGNOSIS — R2689 Other abnormalities of gait and mobility: Secondary | ICD-10-CM

## 2021-02-08 DIAGNOSIS — I63511 Cerebral infarction due to unspecified occlusion or stenosis of right middle cerebral artery: Secondary | ICD-10-CM

## 2021-02-08 NOTE — Therapy (Signed)
[facility] [address] [place], [place], [address] Phone: [phone]   Fax:  [phone]  Physical Therapy Treatment  Patient Details  Name: Jill Rose MRN: [id number] Date of Birth: 1940-12-11 Referring Provider (PT): [name], MD - PM&R  Micki Riley, MD - Neuro   Encounter Date: 02/08/2021   PT End of Session - 02/08/21 1407     Visit Number 9    Number of Visits 20    Date for PT Re-Evaluation 03/11/21    Authorization Type UHC MCR    PT Start Time 1354    PT Stop Time 1430    PT Time Calculation (min) 36 min    Activity Tolerance Patient tolerated treatment well;Patient limited by fatigue    Behavior During Therapy Flat affect             Past Medical History:  Diagnosis Date   Diabetes mellitus without complication (HCC)    Essential hypertension    High cholesterol    Hypothyroidism    Stroke [facility])    TIA (transient ischemic attack) 2018    No past surgical history on file.  There were no vitals filed for this visit.   Subjective Assessment - 02/08/21 1406     Subjective doing okay    Patient Stated Goals to get back to walking, get stronger    Currently in Pain? No/denies                               [facility] Adult PT Treatment/Exercise - 02/08/21 0001       Transfers   Transfer Cueing stand pivot transfer with FWW WC <> nustep, mat table <> WC. Max VC and tactile cues for hand placement and step taking to get closer to seated surface for safe transfer. short distance amb with FWW with max cues for stepping.      Lumbar Exercises: Aerobic   Nustep L4 x 6 min      Lumbar Exercises: Supine   Bridge 10 reps   3 sets, 3rd set only with ball squeeze   Other Supine Lumbar Exercises manually resisted LTRs x 5 B, LTR stretch to the right to relieve some tightness. Supine marches 10 B alt x 2 sets. supine clamshell red TB 10 x 3. SLR 2 x 5 each side.                       PT Short Term Goals - 02/07/21 1322       PT SHORT TERM GOAL #1   Title Independent with initial HEP with caregiver assistance    Status Achieved               PT Long Term Goals - 02/07/21 1323       PT LONG TERM GOAL #1   Title Pt will be independent with final HEP in order to build upon functional gains made in therapy.    Status On-going      PT LONG TERM GOAL #2   Title Attain full TUG time and decrease TUG time by at least 50%  with RW to demo decr fall risk    Status Unable to assess      PT LONG TERM GOAL #3   Title Pt will ambulate at least 46' consecutively with RW with min guard in order to demo improved household mobility.  Status On-going      PT LONG TERM GOAL #4   Title Pt will decr 5x sit <> stand time to 40 seconds or less from mat table with UE support in order to demo improved functional BLE strength.    Status On-going                   Plan - 02/08/21 1407     Clinical Impression Statement Pt tolerated all interventions well today. arrived approx 10 minute late so session abbreviated today. Focused on fuctional transfers today and incorporated some supine strengthening, most difficulty and partial ROM noted with hip extension, lower trunk rtoation to the right, and difficulty maintianing knee ext with SLR. LE would list over to the left as she fatigued as well, required tactile cues and faciltiation to maintain neutral alignment with exercises.    Personal Factors and Comorbidities Past/Current Experience;Time since onset of injury/illness/exacerbation    Stability/Clinical Decision Making Evolving/Moderate complexity    Rehab Potential Fair    PT Frequency 2x / week    PT Duration 4 weeks    PT Treatment/Interventions ADLs/Self Care Home Management;Cryotherapy;Electrical Stimulation;Moist Heat;Gait training;DME Instruction;Neuromuscular re-education;Therapeutic exercise;Therapeutic activities;Functional mobility  training;Patient/family education;Manual techniques;Energy conservation;Orthotic Fit/Training;Vestibular;Taping;Balance training;Passive range of motion;Dry needling    PT Next Visit Plan Reassess HEP. General strength, fxnal mobility, gait as tolerated. Manual/modalities as needed    Consulted and Agree with Plan of Care Patient;Family member/caregiver    Family Member Consulted Caregiver/CNA Ranay             Patient will benefit from skilled therapeutic intervention in order to improve the following deficits and impairments:  Abnormal gait, Difficulty walking, Impaired UE functional use, Decreased endurance, Decreased balance, Decreased mobility, Decreased strength, Postural dysfunction, Pain, Impaired vision/preception, Impaired tone  Visit Diagnosis: Muscle weakness (generalized)  Right middle cerebral artery stroke (HCC)  Unsteadiness on feet  Other lack of coordination  Other abnormalities of gait and mobility  Hemiplegia and hemiparesis following cerebral infarction affecting left non-dominant side [facility])     Problem List Patient Active Problem List   Diagnosis Date Noted   Primary osteoarthritis of left knee 08/30/2020   Controlled type 2 diabetes mellitus with hyperglycemia, without long-term current use of insulin (HCC) 04/06/2020   Abnormality of gait 02/03/2020   Benign essential HTN    CKD (chronic kidney disease), stage II    Hemiparesis affecting left side as late effect of stroke (HCC)    Labile blood glucose    Labile blood pressure    Elevated BUN    Hyponatremia    Diabetic peripheral neuropathy (HCC)    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   Dyslipidemia    Class 1 obesity due to excess calories with serious comorbidity and body mass index (BMI) of 30.0 to 30.9 in adult    Left hemiparesis (HCC)    Controlled type 2 diabetes mellitus with hyperglycemia (HCC)    Morbid obesity (HCC)    Leukocytosis    History of TIA (transient  ischemic attack)    Acute CVA (cerebrovascular accident) (HCC) 01/06/2020   Acute ischemic stroke (HCC) 01/05/2020   Hypertensive emergency 01/26/2017   TIA (transient ischemic attack) 01/26/2017   Hypothyroidism 01/26/2017   Aphasia    Essential hypertension    Diabetes mellitus without complication (HCC)     Anson Crofts, PT, DPT 02/08/2021, 2:57 PM  [facility] [address] [place], [place], [address] Phone:  [phone]   Fax:  [phone]  Name: Jill Rose MRN: [id number] Date of Birth: 27-Oct-1940

## 2021-02-14 ENCOUNTER — Ambulatory Visit: Payer: Medicare Other

## 2021-02-15 ENCOUNTER — Ambulatory Visit: Payer: Medicare Other | Attending: Physical Medicine & Rehabilitation

## 2021-02-15 ENCOUNTER — Other Ambulatory Visit: Payer: Self-pay

## 2021-02-15 DIAGNOSIS — R278 Other lack of coordination: Secondary | ICD-10-CM | POA: Insufficient documentation

## 2021-02-15 DIAGNOSIS — I63511 Cerebral infarction due to unspecified occlusion or stenosis of right middle cerebral artery: Secondary | ICD-10-CM | POA: Diagnosis present

## 2021-02-15 DIAGNOSIS — R2689 Other abnormalities of gait and mobility: Secondary | ICD-10-CM | POA: Insufficient documentation

## 2021-02-15 DIAGNOSIS — I69354 Hemiplegia and hemiparesis following cerebral infarction affecting left non-dominant side: Secondary | ICD-10-CM | POA: Diagnosis present

## 2021-02-15 DIAGNOSIS — R2681 Unsteadiness on feet: Secondary | ICD-10-CM | POA: Diagnosis present

## 2021-02-15 DIAGNOSIS — M6281 Muscle weakness (generalized): Secondary | ICD-10-CM | POA: Insufficient documentation

## 2021-02-15 NOTE — Therapy (Signed)
[facility] [address] [place], [place], [address] Phone: [phone]   Fax:  [phone]  Physical Therapy Treatment 10th visit Progress Note Reporting Period 11/16/20 to 02/15/21 for visits 1-10  See note below for Objective Data and Assessment of Progress/Goals.     Patient Details  Name: Jill Rose MRN: [id number] Date of Birth: [birth year] Referring Provider (PT): Allena Katz, Maryln Gottron, MD - PM&R  Micki Riley, MD - Neuro   Encounter Date: 02/15/2021   PT End of Session - 02/15/21 1520     Visit Number 10    Number of Visits 20    Date for PT Re-Evaluation 03/11/21    Authorization Type UHC MCR    PT Start Time 1515    PT Stop Time 1600    PT Time Calculation (min) 45 min    Activity Tolerance Patient tolerated treatment well;Patient limited by fatigue    Behavior During Therapy Flat affect             Past Medical History:  Diagnosis Date   Diabetes mellitus without complication (HCC)    Essential hypertension    High cholesterol    Hypothyroidism    Stroke [facility])    TIA (transient ischemic attack) 2018    History reviewed. No pertinent surgical history.  There were no vitals filed for this visit.   Subjective Assessment - 02/15/21 1519     Subjective doing okay. some pain this morning but not right now    Patient Stated Goals to get back to walking, get stronger    Currently in Pain? No/denies                               [facility] Adult PT Treatment/Exercise - 02/15/21 0001       Transfers   Transfer Cueing stand pivot transfers with FWW , max cues for stepping especially for right leg advancement, L SLS. Cues for sequencing and      Ambulation/Gait   Pre-Gait Activities FWW With WC follow, PT CGA+ min A for WS, max cues for step taking. 10 ft + 10 ft (standing break every 4-5 ft) very small steps but some improvement in foot clearance with black TB assist for RLE for  swing/advancement and PT min A for WS (15 minutes total)      Lumbar Exercises: Aerobic   Nustep L4 x 6 min      Lumbar Exercises: Supine   Bridge 10 reps   3 sets with ball squeeze, 2 sets with towel PT assist to increase range of hip ext   Other Supine Lumbar Exercises LTR stretch to the right to relieve some tightness. Supine marches 10 B alt x 2 sets.  SLR x 10 each side without break. LTR/oblique engagement on ball L/R x 10 PT cueing and intermittent assist.      Knee/Hip Exercises: Seated   Ball Squeeze 2 x 10 green ball   supine                     PT Short Term Goals - 02/07/21 1322       PT SHORT TERM GOAL #1   Title Independent with initial HEP with caregiver assistance    Status Achieved               PT Long Term Goals - 02/07/21 1323  PT LONG TERM GOAL #1   Title Pt will be independent with final HEP in order to build upon functional gains made in therapy.    Status On-going      PT LONG TERM GOAL #2   Title Attain full TUG time and decrease TUG time by at least 50%  with RW to demo decr fall risk    Status Unable to assess      PT LONG TERM GOAL #3   Title Pt will ambulate at least 37' consecutively with RW with min guard in order to demo improved household mobility.    Status On-going      PT LONG TERM GOAL #4   Title Pt will decr 5x sit <> stand time to 40 seconds or less from mat table with UE support in order to demo improved functional BLE strength.    Status On-going                   Plan - 02/15/21 1520     Clinical Impression Statement Pt tolerated all interventions fairly today. Gait continues to be the most difficult especially stepping with the right leg/ achieving SLS on the left. She did the most walking in session that she has done previously, with FWW and WC follow, PT min A for WS and heavy cues for step taking forward. We did utilize TB wraping technique to facilitate swing phase of gait on the right side and  this in combination with facilitation for WS she was able to take better steps, slightly less shuffling. Shuffling increased with fatigue. Pt is slowly progressing towards goals at this time and will benefit from continued skilled PT services to work on functional mobility, strength, balance and gait.    Personal Factors and Comorbidities Past/Current Experience;Time since onset of injury/illness/exacerbation    Stability/Clinical Decision Making Evolving/Moderate complexity    Rehab Potential Fair    PT Frequency 2x / week    PT Duration 4 weeks    PT Treatment/Interventions ADLs/Self Care Home Management;Cryotherapy;Electrical Stimulation;Moist Heat;Gait training;DME Instruction;Neuromuscular re-education;Therapeutic exercise;Therapeutic activities;Functional mobility training;Patient/family education;Manual techniques;Energy conservation;Orthotic Fit/Training;Vestibular;Taping;Balance training;Passive range of motion;Dry needling    PT Next Visit Plan Reassess HEP. General strength, fxnal mobility, gait as tolerated. Manual/modalities as needed    Consulted and Agree with Plan of Care Patient;Family member/caregiver    Family Member Consulted Caregiver/CNA Ranay             Patient will benefit from skilled therapeutic intervention in order to improve the following deficits and impairments:  Abnormal gait, Difficulty walking, Impaired UE functional use, Decreased endurance, Decreased balance, Decreased mobility, Decreased strength, Postural dysfunction, Pain, Impaired vision/preception, Impaired tone  Visit Diagnosis: Muscle weakness (generalized)  Right middle cerebral artery stroke (HCC)  Unsteadiness on feet  Other lack of coordination  Other abnormalities of gait and mobility  Hemiplegia and hemiparesis following cerebral infarction affecting left non-dominant side [facility])     Problem List Patient Active Problem List   Diagnosis Date Noted   Primary osteoarthritis of left  knee 08/30/2020   Controlled type 2 diabetes mellitus with hyperglycemia, without long-term current use of insulin (HCC) 04/06/2020   Abnormality of gait 02/03/2020   Benign essential HTN    CKD (chronic kidney disease), stage II    Hemiparesis affecting left side as late effect of stroke (HCC)    Labile blood glucose    Labile blood pressure    Elevated BUN    Hyponatremia    Diabetic peripheral  neuropathy (HCC)    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   Dyslipidemia    Class 1 obesity due to excess calories with serious comorbidity and body mass index (BMI) of 30.0 to 30.9 in adult    Left hemiparesis (HCC)    Controlled type 2 diabetes mellitus with hyperglycemia (HCC)    Morbid obesity (HCC)    Leukocytosis    History of TIA (transient ischemic attack)    Acute CVA (cerebrovascular accident) (HCC) 01/06/2020   Acute ischemic stroke (HCC) 01/05/2020   Hypertensive emergency 01/26/2017   TIA (transient ischemic attack) 01/26/2017   Hypothyroidism 01/26/2017   Aphasia    Essential hypertension    Diabetes mellitus without complication (HCC)     Anson Crofts, PT, DPT 02/15/2021, 4:07 PM  [facility] [place] [address] [place], [place], [address] Phone: [phone]   Fax:  [phone]  Name: Jill Rose MRN: [id number] Date of Birth: Apr 28, 1941

## 2021-02-21 ENCOUNTER — Ambulatory Visit: Payer: Medicare Other

## 2021-02-22 ENCOUNTER — Ambulatory Visit: Payer: Medicare Other

## 2021-02-22 ENCOUNTER — Other Ambulatory Visit: Payer: Self-pay

## 2021-02-22 ENCOUNTER — Telehealth: Payer: Self-pay | Admitting: *Deleted

## 2021-02-22 DIAGNOSIS — I69354 Hemiplegia and hemiparesis following cerebral infarction affecting left non-dominant side: Secondary | ICD-10-CM

## 2021-02-22 DIAGNOSIS — M6281 Muscle weakness (generalized): Secondary | ICD-10-CM | POA: Diagnosis not present

## 2021-02-22 DIAGNOSIS — I63511 Cerebral infarction due to unspecified occlusion or stenosis of right middle cerebral artery: Secondary | ICD-10-CM

## 2021-02-22 DIAGNOSIS — R278 Other lack of coordination: Secondary | ICD-10-CM

## 2021-02-22 DIAGNOSIS — R2689 Other abnormalities of gait and mobility: Secondary | ICD-10-CM

## 2021-02-22 DIAGNOSIS — R2681 Unsteadiness on feet: Secondary | ICD-10-CM

## 2021-02-22 NOTE — Telephone Encounter (Signed)
Ms Jill Rose, daughter of Jill Rose called about who will assume her care since Dr Allena Katz is leaving, and needing refills on clopidogrel. I directed her to Dr Pearlean Brownie for the clopidogrel.  As far as assuming her rehab care her PCP would be the person but they are leaving as well.  Can you get her in with one of our stroke doctors-Raulkar or Kirsteins.

## 2021-02-22 NOTE — Therapy (Signed)
[facility] [place] [address] [place], [place], [address] Phone: [phone]   Fax:  [phone]  Physical Therapy Treatment  Patient Details  Name: Jill Rose MRN: [id number] Date of Birth: 30-Nov-1940 Referring Provider (PT): [name], MD - PM&R  Micki Riley, MD - Neuro   Encounter Date: 02/22/2021   PT End of Session - 02/22/21 1315     Visit Number 11    Number of Visits 20    Date for PT Re-Evaluation 03/11/21    Authorization Type UHC MCR    PT Start Time 1311    PT Stop Time 1345    PT Time Calculation (min) 34 min    Activity Tolerance Patient tolerated treatment well;Patient limited by fatigue    Behavior During Therapy Flat affect             Past Medical History:  Diagnosis Date   Diabetes mellitus without complication (HCC)    Essential hypertension    High cholesterol    Hypothyroidism    Stroke [facility])    TIA (transient ischemic attack) 2018    History reviewed. No pertinent surgical history.  There were no vitals filed for this visit.   Subjective Assessment - 02/22/21 1315     Subjective doing okay. no new changes, no falls    Patient Stated Goals to get back to walking, get stronger    Currently in Pain? No/denies                               [facility] Adult PT Treatment/Exercise - 02/22/21 0001       Transfers   Transfer Cueing stand pivot transfer with FWW WC <> nustep, mat table <> WC. Max VC and tactile cues for hand placement and step taking to get closer to seated surface for safe transfer. short distance amb with FWW with max cues for stepping.      Ambulation/Gait   Pre-Gait Activities march in place with hands on FWW and PT assist for WS left to lift right leg. 2 x 10.      Lumbar Exercises: Aerobic   Nustep L4 x 6 min      Lumbar Exercises: Seated   Sit to Stand --   2 x 10 from slightly elevated mat table, hands on table/thigh to push up instead of  pulling - hand on back of locked WC to stabilize in standing. completed x 10 1-no UE in standing                     PT Short Term Goals - 02/07/21 1322       PT SHORT TERM GOAL #1   Title Independent with initial HEP with caregiver assistance    Status Achieved               PT Long Term Goals - 02/07/21 1323       PT LONG TERM GOAL #1   Title Pt will be independent with final HEP in order to build upon functional gains made in therapy.    Status On-going      PT LONG TERM GOAL #2   Title Attain full TUG time and decrease TUG time by at least 50%  with RW to demo decr fall risk    Status Unable to assess      PT LONG TERM GOAL #3  Title Pt will ambulate at least 63' consecutively with RW with min guard in order to demo improved household mobility.    Status On-going      PT LONG TERM GOAL #4   Title Pt will decr 5x sit <> stand time to 40 seconds or less from mat table with UE support in order to demo improved functional BLE strength.    Status On-going                   Plan - 02/22/21 1316     Clinical Impression Statement Pt arrived 11 minutes late to session with caregiver. Focus of session on functional mobility. She continues to demonstrate more difficulty with advancing RLE due to diminished left WS. she did get more fatigue with shorter distance walk today with pain on the LLE, that knee does demonstrate instability in stance with some varus. Plan for next session to try a progress gait training increased distance as tolerated with WC follow, may try TB wrapping technique to facilitate leg advancement and steps as there seemed to be some success with this for gait training previously. She also did demosntrate improved step quality and symmetry when given a number of steps to take forward.    Personal Factors and Comorbidities Past/Current Experience;Time since onset of injury/illness/exacerbation    Stability/Clinical Decision Making  Evolving/Moderate complexity    Rehab Potential Fair    PT Frequency 2x / week    PT Duration 4 weeks    PT Treatment/Interventions ADLs/Self Care Home Management;Cryotherapy;Electrical Stimulation;Moist Heat;Gait training;DME Instruction;Neuromuscular re-education;Therapeutic exercise;Therapeutic activities;Functional mobility training;Patient/family education;Manual techniques;Energy conservation;Orthotic Fit/Training;Vestibular;Taping;Balance training;Passive range of motion;Dry needling    PT Next Visit Plan Reassess HEP. General strength, fxnal mobility, gait as tolerated. Manual/modalities as needed    Consulted and Agree with Plan of Care Patient;Family member/caregiver    Family Member Consulted Caregiver/CNA Ranay             Patient will benefit from skilled therapeutic intervention in order to improve the following deficits and impairments:  Abnormal gait, Difficulty walking, Impaired UE functional use, Decreased endurance, Decreased balance, Decreased mobility, Decreased strength, Postural dysfunction, Pain, Impaired vision/preception, Impaired tone  Visit Diagnosis: Muscle weakness (generalized)  Right middle cerebral artery stroke (HCC)  Unsteadiness on feet  Other lack of coordination  Other abnormalities of gait and mobility  Hemiplegia and hemiparesis following cerebral infarction affecting left non-dominant side [facility])     Problem List Patient Active Problem List   Diagnosis Date Noted   Primary osteoarthritis of left knee 08/30/2020   Controlled type 2 diabetes mellitus with hyperglycemia, without long-term current use of insulin (HCC) 04/06/2020   Abnormality of gait 02/03/2020   Benign essential HTN    CKD (chronic kidney disease), stage II    Hemiparesis affecting left side as late effect of stroke (HCC)    Labile blood glucose    Labile blood pressure    Elevated BUN    Hyponatremia    Diabetic peripheral neuropathy (HCC)    Right middle cerebral  artery stroke (HCC) 01/14/2020   Dyslipidemia    Class 1 obesity due to excess calories with serious comorbidity and body mass index (BMI) of 30.0 to 30.9 in adult    Left hemiparesis (HCC)    Controlled type 2 diabetes mellitus with hyperglycemia (HCC)    Morbid obesity (HCC)    Leukocytosis    History of TIA (transient ischemic attack)    Acute CVA (cerebrovascular accident) (HCC) 01/06/2020  Acute ischemic stroke (HCC) 01/05/2020   Hypertensive emergency 01/26/2017   TIA (transient ischemic attack) 01/26/2017   Hypothyroidism 01/26/2017   Aphasia    Essential hypertension    Diabetes mellitus without complication (HCC)     Anson Crofts, PT, DPT 02/22/2021, 2:34 PM  [facility] [place] [facility] [address] [place], [place], [address] Phone: [phone]   Fax:  [phone]  Name: Jill Rose MRN: [id number] Date of Birth: 02-08-1941

## 2021-02-28 ENCOUNTER — Ambulatory Visit: Payer: Medicare Other

## 2021-02-28 ENCOUNTER — Other Ambulatory Visit: Payer: Self-pay

## 2021-02-28 DIAGNOSIS — I63511 Cerebral infarction due to unspecified occlusion or stenosis of right middle cerebral artery: Secondary | ICD-10-CM

## 2021-02-28 DIAGNOSIS — M6281 Muscle weakness (generalized): Secondary | ICD-10-CM

## 2021-02-28 DIAGNOSIS — R2681 Unsteadiness on feet: Secondary | ICD-10-CM

## 2021-02-28 DIAGNOSIS — R2689 Other abnormalities of gait and mobility: Secondary | ICD-10-CM

## 2021-02-28 DIAGNOSIS — R278 Other lack of coordination: Secondary | ICD-10-CM

## 2021-02-28 NOTE — Patient Instructions (Signed)
Access Code: [email or web address] URL: [email or web address] Date: 02/28/2021 Prepared by: Claude Manges  Exercises Sit to Stand with Counter Support - 1 x daily - 4 x weekly - 3 sets - 10 reps Seated March with Resistance - 1 x daily - 4 x weekly - 3 sets - 10 reps Seated Hip Abduction with Resistance - 1 x daily - 4 x weekly - 3 sets - 10 reps Seated Long Arc Quad - 1 x daily - 4 x weekly - 3 sets - 10 reps - 3 second hold Seated inner thigh squeeze with ball or pillow - 1 x daily - 4 x weekly - 3 sets - 10 reps - 3 seconds hold Supine Bridge - 1 x daily - 7 x weekly - 3 sets - 10 reps Small Range Straight Leg Raise - 1 x daily - 7 x weekly - 2 sets - 10 reps Supine Hip Abduction - 1 x daily - 7 x weekly - 3 sets - 10 reps Supine Heel Slides - 1 x daily - 7 x weekly - 2 sets - 10 reps Bent Knee Fallouts - 1 x daily - 7 x weekly - 2 sets - 10 reps

## 2021-02-28 NOTE — Therapy (Signed)
[facility] [address]. [place], [place], [address] Phone: [phone]   Fax:  [phone]  Physical Therapy Treatment  Patient Details  Name: Jill Rose MRN: [id number] Date of Birth: 1940/09/04 Referring Provider (PT): [name], MD - PM&R  Micki Riley, MD - Neuro   Encounter Date: 02/28/2021   PT End of Session - 02/28/21 1312     Visit Number 12    Number of Visits 20    Date for PT Re-Evaluation 03/11/21    Authorization Type UHC MCR    PT Start Time 1308    PT Stop Time 1346    PT Time Calculation (min) 38 min    Equipment Utilized During Treatment Gait belt    Activity Tolerance Patient tolerated treatment well;Patient limited by fatigue    Behavior During Therapy Flat affect             Past Medical History:  Diagnosis Date   Diabetes mellitus without complication (HCC)    Essential hypertension    High cholesterol    Hypothyroidism    Stroke [facility])    TIA (transient ischemic attack) 2018    History reviewed. No pertinent surgical history.  There were no vitals filed for this visit.   Subjective Assessment - 02/28/21 1311     Subjective feeling a bit weaker today, not the same but okay. No falls    Patient Stated Goals to get back to walking, get stronger    Currently in Pain? No/denies                               [facility] Adult PT Treatment/Exercise - 02/28/21 0001       Transfers   Transfer Cueing stand pivot transfer with FWW WC <> nustep, mat table <> WC. Max VC and tactile cues for hand placement and step taking to get closer to seated surface for safe transfer. short distance amb with FWW with max cues for stepping.      Ambulation/Gait   Pre-Gait Activities --      Lumbar Exercises: Aerobic   Nustep L4 x 6 min      Lumbar Exercises: Supine   Heel Slides 10 reps    Bridge --   2x10 green pball, 2 x 10 with ball squeeze   Other Supine Lumbar  Exercises LTR stretch to the right to relieve some tightness. Supine marches 10 B alt x 2 sets.  SLR x 10 each side without break. LTR/oblique engagement on ball L/R x 10 PT cueing and intermittent assist.            Supine <> short sit transfersx 2 max cues for sequencing, especially for bringing LUE over to roll onto right side to sit.  bent knee fall out x 10        PT Education - 02/28/21 1352     Education Details updated HEP              PT Short Term Goals - 02/07/21 1322       PT SHORT TERM GOAL #1   Title Independent with initial HEP with caregiver assistance    Status Achieved               PT Long Term Goals - 02/07/21 1323       PT LONG TERM GOAL #1   Title Pt will be independent  with final HEP in order to build upon functional gains made in therapy.    Status On-going      PT LONG TERM GOAL #2   Title Attain full TUG time and decrease TUG time by at least 50%  with RW to demo decr fall risk    Status Unable to assess      PT LONG TERM GOAL #3   Title Pt will ambulate at least 29' consecutively with RW with min guard in order to demo improved household mobility.    Status On-going      PT LONG TERM GOAL #4   Title Pt will decr 5x sit <> stand time to 40 seconds or less from mat table with UE support in order to demo improved functional BLE strength.    Status On-going                   Plan - 02/28/21 1313     Clinical Impression Statement Arrived ~7 minutes late to session today. pt reports feeling a bit weak today but willing to try exercises, breaks provided as needed. Given current status we focues more on functional mobility and seated/supine exercises and updated HEP was provided with focus on seated and supine exercises that she may complete on her own safely. Can choose whether to do seated set of exercises or supine set of exercises each day to work on improving consistency with HEP which is not very consistent at this time.  Base don how she feels next visit we will focus more on gait.    Personal Factors and Comorbidities Past/Current Experience;Time since onset of injury/illness/exacerbation    Stability/Clinical Decision Making Evolving/Moderate complexity    Rehab Potential Fair    PT Frequency 2x / week    PT Duration 4 weeks    PT Treatment/Interventions ADLs/Self Care Home Management;Cryotherapy;Electrical Stimulation;Moist Heat;Gait training;DME Instruction;Neuromuscular re-education;Therapeutic exercise;Therapeutic activities;Functional mobility training;Patient/family education;Manual techniques;Energy conservation;Orthotic Fit/Training;Vestibular;Taping;Balance training;Passive range of motion;Dry needling    PT Next Visit Plan General strength, fxnal mobility, gait as tolerated. Manual/modalities as needed    Consulted and Agree with Plan of Care Patient;Family member/caregiver    Family Member Consulted Caregiver/CNA Ranay             Patient will benefit from skilled therapeutic intervention in order to improve the following deficits and impairments:  Abnormal gait, Difficulty walking, Impaired UE functional use, Decreased endurance, Decreased balance, Decreased mobility, Decreased strength, Postural dysfunction, Pain, Impaired vision/preception, Impaired tone  Visit Diagnosis: Muscle weakness (generalized)  Right middle cerebral artery stroke (HCC)  Unsteadiness on feet  Other lack of coordination  Other abnormalities of gait and mobility     Problem List Patient Active Problem List   Diagnosis Date Noted   Primary osteoarthritis of left knee 08/30/2020   Controlled type 2 diabetes mellitus with hyperglycemia, without long-term current use of insulin (HCC) 04/06/2020   Abnormality of gait 02/03/2020   Benign essential HTN    CKD (chronic kidney disease), stage II    Hemiparesis affecting left side as late effect of stroke (HCC)    Labile blood glucose    Labile blood pressure     Elevated BUN    Hyponatremia    Diabetic peripheral neuropathy (HCC)    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   Dyslipidemia    Class 1 obesity due to excess calories with serious comorbidity and body mass index (BMI) of 30.0 to 30.9 in adult    Left hemiparesis (  HCC)    Controlled type 2 diabetes mellitus with hyperglycemia (HCC)    Morbid obesity (HCC)    Leukocytosis    History of TIA (transient ischemic attack)    Acute CVA (cerebrovascular accident) (HCC) 01/06/2020   Acute ischemic stroke (HCC) 01/05/2020   Hypertensive emergency 01/26/2017   TIA (transient ischemic attack) 01/26/2017   Hypothyroidism 01/26/2017   Aphasia    Essential hypertension    Diabetes mellitus without complication (HCC)     Anson Crofts, PT, DPT 02/28/2021, 1:54 PM  [facility] [address] [place], [place], [address] Phone: [phone]   Fax:  [phone]  Name: Jill Rose MRN: [id number] Date of Birth: 1940/11/14

## 2021-03-01 ENCOUNTER — Ambulatory Visit: Payer: Medicare Other

## 2021-03-01 DIAGNOSIS — R2681 Unsteadiness on feet: Secondary | ICD-10-CM

## 2021-03-01 DIAGNOSIS — R278 Other lack of coordination: Secondary | ICD-10-CM

## 2021-03-01 DIAGNOSIS — M6281 Muscle weakness (generalized): Secondary | ICD-10-CM

## 2021-03-01 DIAGNOSIS — I69354 Hemiplegia and hemiparesis following cerebral infarction affecting left non-dominant side: Secondary | ICD-10-CM

## 2021-03-01 DIAGNOSIS — I63511 Cerebral infarction due to unspecified occlusion or stenosis of right middle cerebral artery: Secondary | ICD-10-CM

## 2021-03-01 DIAGNOSIS — R2689 Other abnormalities of gait and mobility: Secondary | ICD-10-CM

## 2021-03-01 NOTE — Therapy (Signed)
[facility] [address] [place], [place], [address] Phone: [phone]   Fax:  [phone]  Physical Therapy Treatment  Patient Details  Name: Jill Rose MRN: [id number] Date of Birth: August 29, 1940 Referring Provider (PT): [name], MD - PM&R  Micki Riley, MD - Neuro   Encounter Date: 03/01/2021   PT End of Session - 03/01/21 1311     Visit Number 13    Number of Visits 20    Date for PT Re-Evaluation 03/11/21    Authorization Type UHC MCR    PT Start Time 1306    PT Stop Time 1346    PT Time Calculation (min) 40 min    Equipment Utilized During Treatment Gait belt    Activity Tolerance Patient tolerated treatment well;Patient limited by fatigue    Behavior During Therapy Flat affect             Past Medical History:  Diagnosis Date   Diabetes mellitus without complication (HCC)    Essential hypertension    High cholesterol    Hypothyroidism    Stroke [facility])    TIA (transient ischemic attack) 2018    History reviewed. No pertinent surgical history.  There were no vitals filed for this visit.   Subjective Assessment - 03/01/21 1310     Subjective Feeling a bit better today    Patient Stated Goals to get back to walking, get stronger    Currently in Pain? No/denies                               [facility] Adult PT Treatment/Exercise - 03/01/21 0001       Ambulation/Gait   Gait Comments ambulation with FWW with TB assist x 5 meters (~16 ft),seated  break. 5 meters x 2 (seated rest in between) with intermittent min A WS lateral to facilitate foot clearance/advancement (30 minutes total) - visual end targets provided    T Therapeutic Activities   Nustep - aerobic    Functional mobility L5 x 6 minutes    STS transfers, stand pivot transfers nustep <> WC cues provided for sequencing, WS, and hand placement.                      PT Short Term Goals - 02/07/21  1322       PT SHORT TERM GOAL #1   Title Independent with initial HEP with caregiver assistance    Status Achieved               PT Long Term Goals - 02/07/21 1323       PT LONG TERM GOAL #1   Title Pt will be independent with final HEP in order to build upon functional gains made in therapy.    Status On-going      PT LONG TERM GOAL #2   Title Attain full TUG time and decrease TUG time by at least 50%  with RW to demo decr fall risk    Status Unable to assess      PT LONG TERM GOAL #3   Title Pt will ambulate at least 22' consecutively with RW with min guard in order to demo improved household mobility.    Status On-going      PT LONG TERM GOAL #4   Title Pt will decr 5x sit <> stand time to 40 seconds or less from  mat table with UE support in order to demo improved functional BLE strength.    Status On-going                   Plan - 03/01/21 1311     Clinical Impression Statement Pt reported she was feeling a bit better todaycompared to yesterday. Able to complete higher resistance on nustep today.Rest of session with emphasis on gait training at a higher intensity with FWW, gait is still slow but was able to complete longer bouts of ambulation today with intermittent rest breaks in sitting as needed as CNA followed with WC. Giavonni did require intermittent assist for lateral Weight shifting min A from PT to improve LE advancement consistency however she did demosntrate some carryover, step quality diminished moreso withfatigue where she would begin to shuffle a bit. Plan to reassess tolerance to todays gait training next visit.    Personal Factors and Comorbidities Past/Current Experience;Time since onset of injury/illness/exacerbation    Stability/Clinical Decision Making Evolving/Moderate complexity    Rehab Potential Fair    PT Frequency 2x / week    PT Duration 4 weeks    PT Treatment/Interventions ADLs/Self Care Home Management;Cryotherapy;Electrical  Stimulation;Moist Heat;Gait training;DME Instruction;Neuromuscular re-education;Therapeutic exercise;Therapeutic activities;Functional mobility training;Patient/family education;Manual techniques;Energy conservation;Orthotic Fit/Training;Vestibular;Taping;Balance training;Passive range of motion;Dry needling    PT Next Visit Plan General strength, fxnal mobility, gait as tolerated. Manual/modalities as needed    Consulted and Agree with Plan of Care Patient;Family member/caregiver    Family Member Consulted Caregiver/CNA Ranay             Patient will benefit from skilled therapeutic intervention in order to improve the following deficits and impairments:  Abnormal gait, Difficulty walking, Impaired UE functional use, Decreased endurance, Decreased balance, Decreased mobility, Decreased strength, Postural dysfunction, Pain, Impaired vision/preception, Impaired tone  Visit Diagnosis: Muscle weakness (generalized)  Right middle cerebral artery stroke (HCC)  Unsteadiness on feet  Other lack of coordination  Other abnormalities of gait and mobility  Hemiplegia and hemiparesis following cerebral infarction affecting left non-dominant side [facility])     Problem List Patient Active Problem List   Diagnosis Date Noted   Primary osteoarthritis of left knee 08/30/2020   Controlled type 2 diabetes mellitus with hyperglycemia, without long-term current use of insulin (HCC) 04/06/2020   Abnormality of gait 02/03/2020   Benign essential HTN    CKD (chronic kidney disease), stage II    Hemiparesis affecting left side as late effect of stroke (HCC)    Labile blood glucose    Labile blood pressure    Elevated BUN    Hyponatremia    Diabetic peripheral neuropathy (HCC)    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   Dyslipidemia    Class 1 obesity due to excess calories with serious comorbidity and body mass index (BMI) of 30.0 to 30.9 in adult    Left hemiparesis (HCC)     Controlled type 2 diabetes mellitus with hyperglycemia (HCC)    Morbid obesity (HCC)    Leukocytosis    History of TIA (transient ischemic attack)    Acute CVA (cerebrovascular accident) (HCC) 01/06/2020   Acute ischemic stroke (HCC) 01/05/2020   Hypertensive emergency 01/26/2017   TIA (transient ischemic attack) 01/26/2017   Hypothyroidism 01/26/2017   Aphasia    Essential hypertension    Diabetes mellitus without complication (HCC)     Anson Crofts, PT, DPT 03/01/2021, 1:55 PM  [facility] [address]  [place], [place], [address] Phone: [phone]   Fax:  [phone]  Name: LAVENDER STANKE MRN: [id number] Date of Birth: 1940-09-15

## 2021-03-07 ENCOUNTER — Ambulatory Visit: Payer: Medicare Other

## 2021-03-08 ENCOUNTER — Ambulatory Visit: Payer: Medicare Other

## 2021-03-08 ENCOUNTER — Other Ambulatory Visit: Payer: Self-pay

## 2021-03-08 DIAGNOSIS — R2689 Other abnormalities of gait and mobility: Secondary | ICD-10-CM

## 2021-03-08 DIAGNOSIS — M6281 Muscle weakness (generalized): Secondary | ICD-10-CM

## 2021-03-08 DIAGNOSIS — I63511 Cerebral infarction due to unspecified occlusion or stenosis of right middle cerebral artery: Secondary | ICD-10-CM

## 2021-03-08 DIAGNOSIS — I69354 Hemiplegia and hemiparesis following cerebral infarction affecting left non-dominant side: Secondary | ICD-10-CM

## 2021-03-08 DIAGNOSIS — R278 Other lack of coordination: Secondary | ICD-10-CM

## 2021-03-08 DIAGNOSIS — R2681 Unsteadiness on feet: Secondary | ICD-10-CM

## 2021-03-08 NOTE — Therapy (Signed)
[place]. [place], [place], [address] Phone: [phone]   Fax:  [phone]  Physical Therapy Treatment/Recertification  Patient Details  Name: Jill Rose MRN: [id number] Date of Birth: 1940-11-05 Referring Provider (PT): Jamse Arn, MD - PM&R  Garvin Fila, MD - Neuro   Encounter Date: 03/08/2021   PT End of Session - 03/08/21 1510     Visit Number 14    Number of Visits 20    Date for PT Re-Evaluation 04/11/21    Authorization Type UHC MCR    PT Start Time 3903    PT Stop Time 1345    PT Time Calculation (min) 39 min    Equipment Utilized During Treatment Gait belt    Activity Tolerance Patient tolerated treatment well;Patient limited by fatigue    Behavior During Therapy Flat affect             Past Medical History:  Diagnosis Date   Diabetes mellitus without complication ([place])    Essential hypertension    High cholesterol    Hypothyroidism    Stroke [facility])    TIA (transient ischemic attack) 2018    No past surgical history on file.  There were no vitals filed for this visit.       [facility] PT Assessment - 03/08/21 0001       Transfers   Five time sit to stand comments  45 seconds from [facility] with BUE support                           [facility] Adult PT Treatment/Exercise - 03/08/21 0001       Transfers   Transfer Cueing stand pivot transfer with FWW WC <> nustep, mat table <> WC. Max VC and tactile cues for hand placement and step taking to get closer to seated surface for safe transfer. short distance amb with FWW with max cues for stepping.      Ambulation/Gait   Gait Comments ambulation with FWW 5 meters x 2 with WC follow, cues for counting steps to encourage full step advancement instead of shuffling      Lumbar Exercises: Aerobic   Nustep L5 x 6 minutes      Knee/Hip Exercises: Seated   Marching Both;2 sets;10 reps   alternating, cues provided to  encourage march height   Sit to Sand 3 sets;5 reps;with UE support                      PT Short Term Goals - 02/07/21 1322       PT SHORT TERM GOAL #1   Title Independent with initial HEP with caregiver assistance    Status Achieved               PT Long Term Goals - 03/08/21 1308       PT LONG TERM GOAL #1   Title Pt will be independent with final HEP in order to build upon functional gains made in therapy.    Status On-going      PT LONG TERM GOAL #2   Title Attain full TUG time and decrease TUG time by at least 50%  with RW to demo decr fall risk    Status On-going      PT LONG TERM GOAL #3   Title Pt will ambulate at least 81' consecutively with RW with min guard in order to  demo improved household mobility.    Status On-going   can complete 3 bouts of 10-20 feet at a time with RW and WC and cues for step taking. Intermittent assist needed for WS to help advance leg     PT LONG TERM GOAL #4   Title Pt will decr 5x sit <> stand time to 40 seconds or less from mat table with UE support in order to demo improved functional BLE strength.    Status Partially Met   45 seconds heavy UE dependence and decreased eccentric control                  Plan - 03/08/21 1511     Clinical Impression Statement Pt seems to have generally plataeud in progress for speed and strength, still very UE dependent for STS  and ambulation. Overall gait still can be shuffling in nature with difficult advancement  but with cues for achieving a number of steps and intermittent min A for lateral WS she does demonstrate some improved foot clearance and forward progression with walker. Given current status plan to decrease to 1x/wk emphasis on funcitonal mobility and ambulation safety to decrease fall risk and improve ease with transfers, ambulation with caregivers. Reinforced safe completion of home program sitting and supine exercises that she can do on her own safely.    Personal  Factors and Comorbidities Past/Current Experience;Time since onset of injury/illness/exacerbation    Stability/Clinical Decision Making Evolving/Moderate complexity    Rehab Potential Fair    PT Frequency 1x / week    PT Duration 4 weeks    PT Treatment/Interventions ADLs/Self Care Home Management;Cryotherapy;Electrical Stimulation;Moist Heat;Gait training;DME Instruction;Neuromuscular re-education;Therapeutic exercise;Therapeutic activities;Functional mobility training;Patient/family education;Manual techniques;Energy conservation;Orthotic Fit/Training;Vestibular;Taping;Balance training;Passive range of motion;Dry needling    PT Next Visit Plan Continue to reinforce HEP carryover for home to facilitate consistent strengthening and wellness. [place] with focus on transfer/standing stability, gait training working on Facilities manager and Agree with Plan of Care Patient;Family member/caregiver    Family Member Consulted Caregiver/CNA Ranay             Patient will benefit from skilled therapeutic intervention in order to improve the following deficits and impairments:  Abnormal gait, Difficulty walking, Impaired UE functional use, Decreased endurance, Decreased balance, Decreased mobility, Decreased strength, Postural dysfunction, Pain, Impaired vision/preception, Impaired tone  Visit Diagnosis: Muscle weakness (generalized)  Right middle cerebral artery stroke (HCC)  Unsteadiness on feet  Other lack of coordination  Other abnormalities of gait and mobility  Hemiplegia and hemiparesis following cerebral infarction affecting left non-dominant side [facility])     Problem List Patient Active Problem List   Diagnosis Date Noted   Primary osteoarthritis of left knee 08/30/2020   Controlled type 2 diabetes mellitus with hyperglycemia, without long-term current use of insulin ([place]) 04/06/2020   Abnormality of gait 02/03/2020   Benign essential HTN    CKD (chronic kidney  disease), stage II    Hemiparesis affecting left side as late effect of stroke (HCC)    Labile blood glucose    Labile blood pressure    Elevated BUN    Hyponatremia    Diabetic peripheral neuropathy (HCC)    Right middle cerebral artery stroke ([place]) 01/14/2020   Dyslipidemia    Class 1 obesity due to excess calories with serious comorbidity and body mass index (BMI) of 30.0 to 30.9 in adult    Left hemiparesis ([place])    Controlled type 2 diabetes mellitus with hyperglycemia (  [place])    Morbid obesity ([place])    Leukocytosis    History of TIA (transient ischemic attack)    Acute CVA (cerebrovascular accident) ([place]) 01/06/2020   Acute ischemic stroke ([place]) 01/05/2020   Hypertensive emergency 01/26/2017   TIA (transient ischemic attack) 01/26/2017   Hypothyroidism 01/26/2017   Aphasia    Essential hypertension    Diabetes mellitus without complication ([place])     Hall Busing, PT, DPT 03/08/2021, 3:21 PM  [place]. [place], [place], [address] Phone: [phone]   Fax:  [phone]  Name: Jill Rose MRN: [id number] Date of Birth: March 10, 1941

## 2021-03-14 ENCOUNTER — Ambulatory Visit: Payer: Medicare Other

## 2021-03-14 ENCOUNTER — Other Ambulatory Visit: Payer: Self-pay

## 2021-03-14 DIAGNOSIS — R278 Other lack of coordination: Secondary | ICD-10-CM

## 2021-03-14 DIAGNOSIS — M6281 Muscle weakness (generalized): Secondary | ICD-10-CM | POA: Diagnosis not present

## 2021-03-14 DIAGNOSIS — R2689 Other abnormalities of gait and mobility: Secondary | ICD-10-CM

## 2021-03-14 DIAGNOSIS — R2681 Unsteadiness on feet: Secondary | ICD-10-CM

## 2021-03-14 DIAGNOSIS — I63511 Cerebral infarction due to unspecified occlusion or stenosis of right middle cerebral artery: Secondary | ICD-10-CM

## 2021-03-15 ENCOUNTER — Ambulatory Visit: Payer: Medicare Other

## 2021-03-15 NOTE — Therapy (Signed)
[facility] [place]. [place], [place], [address] Phone: [phone]   Fax:  [phone]  Physical Therapy Treatment  Patient Details  Name: Jill Rose MRN: [id number] Date of Birth: [birth year] Referring Provider (PT): Jamse Arn, MD - PM&R  Garvin Fila, MD - Neuro   Encounter Date: 03/14/2021   PT End of Session - 03/14/21 1315     Visit Number 15    Number of Visits 20    Date for PT Re-Evaluation 04/11/21    Authorization Type [employer] MCR    PT Start Time 2836    PT Stop Time 1345    PT Time Calculation (min) 37 min    Equipment Utilized During Treatment Gait belt    Activity Tolerance Patient tolerated treatment well;Patient limited by fatigue    Behavior During Therapy Flat affect             Past Medical History:  Diagnosis Date   Diabetes mellitus without complication ([place])    Essential hypertension    High cholesterol    Hypothyroidism    Stroke [facility])    TIA (transient ischemic attack) 2018    History reviewed. No pertinent surgical history.  There were no vitals filed for this visit.   Subjective Assessment - 03/14/21 1315     Subjective Doing well. did try some exercises at her own.    Patient Stated Goals to get back to walking, get stronger    Currently in Pain? No/denies                               [facility] Adult PT Treatment/Exercise - 03/14/21 1919       Ambulation/Gait   Gait Comments ambulation with FWW 10 ft x 2 with WC follow, cues for counting steps to encourage full step advancement instead of shuffling      Lumbar Exercises: Aerobic   Nustep L5 x 5 min BUE/LE              Standing balance in  bars: head turns x 10 B with 1 UE support vs no UE support and SBA PT anterior. Up/diagonal reaches x 10 each arm    bars: STS 2 x 10        PT Short Term Goals - 02/07/21 1322       PT SHORT TERM GOAL #1   Title Independent with initial  HEP with caregiver assistance    Status Achieved               PT Long Term Goals - 03/08/21 1308       PT LONG TERM GOAL #1   Title Pt will be independent with final HEP in order to build upon functional gains made in therapy.    Status On-going      PT LONG TERM GOAL #2   Title Attain full TUG time and decrease TUG time by at least 50%  with RW to demo decr fall risk    Status On-going      PT LONG TERM GOAL #3   Title Pt will ambulate at least 62' consecutively with RW with min guard in order to demo improved household mobility.    Status On-going   can complete 3 bouts of 10-20 feet at a time with RW and WC and cues for step taking. Intermittent assist needed for WS to help advance leg  PT LONG TERM GOAL #4   Title Pt will decr 5x sit <> stand time to 40 seconds or less from mat table with UE support in order to demo improved functional BLE strength.    Status Partially Met   45 seconds heavy UE dependence and decreased eccentric control                  Plan - 03/14/21 1316     Clinical Impression Statement arrived to session 10 minutes late today,  abbreviated session. Verbally reinforced HEP with pt. Was leaning excessively to the right with fatigue on nutsep today but able to correct with cues for leaning left/to midline. Trialed some standing balance exercises befor gait training and this was difficult but tolerated fairly, did demosntrate forward trunk flexion with hip flexor tightness - may benefit from further stretching next visit. Gait training with FWW and WC again today for 20 ft with break after 10 ft d/t fatigue, she is able to take first few steps with foot clearance and step advancement but with fatigue, she begins to shuffle more and requires min to mod A to facilitate WS to help withLE advancement. End of session, caregiver noted pt's family would like her to continue PT at a 2x/week frequency, however it was discussed with caregiver that she was  recertified last visit at 1x/wk given plataeu in progress, 2x/wk not justifable at this time. Reinforced HEP compliance for home for strength and endurance maintence with plan to continue funcitonal mobility training in session    Personal Factors and Comorbidities Past/Current Experience;Time since onset of injury/illness/exacerbation    Stability/Clinical Decision Making Evolving/Moderate complexity    Rehab Potential Fair    PT Frequency 1x / week    PT Duration 4 weeks    PT Treatment/Interventions ADLs/Self Care Home Management;Cryotherapy;Electrical Stimulation;Moist Heat;Gait training;DME Instruction;Neuromuscular re-education;Therapeutic exercise;Therapeutic activities;Functional mobility training;Patient/family education;Manual techniques;Energy conservation;Orthotic Fit/Training;Vestibular;Taping;Balance training;Passive range of motion;Dry needling    PT Next Visit Plan Continue to reinforce HEP carryover for home to facilitate consistent strengthening and wellness. Sesson with focus on transfer/standing stability, gait training working on Facilities manager and Agree with Plan of Care Patient;Family member/caregiver    Family Member Consulted Caregiver/CNA Ranay             Patient will benefit from skilled therapeutic intervention in order to improve the following deficits and impairments:  Abnormal gait, Difficulty walking, Impaired UE functional use, Decreased endurance, Decreased balance, Decreased mobility, Decreased strength, Postural dysfunction, Pain, Impaired vision/preception, Impaired tone  Visit Diagnosis: Muscle weakness (generalized)  Right middle cerebral artery stroke (HCC)  Unsteadiness on feet  Other lack of coordination  Other abnormalities of gait and mobility     Problem List Patient Active Problem List   Diagnosis Date Noted   Primary osteoarthritis of left knee 08/30/2020   Controlled type 2 diabetes mellitus with hyperglycemia,  without long-term current use of insulin ([place]) 04/06/2020   Abnormality of gait 02/03/2020   Benign essential HTN    CKD (chronic kidney disease), stage II    Hemiparesis affecting left side as late effect of stroke (HCC)    Labile blood glucose    Labile blood pressure    Elevated BUN    Hyponatremia    Diabetic peripheral neuropathy (HCC)    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   Dyslipidemia    Class 1 obesity due to excess calories with serious comorbidity and body mass index (BMI) of 30.0  to 30.9 in adult    Left hemiparesis (HCC)    Controlled type 2 diabetes mellitus with hyperglycemia (HCC)    Morbid obesity (HCC)    Leukocytosis    History of TIA (transient ischemic attack)    Acute CVA (cerebrovascular accident) ([place]) 01/06/2020   Acute ischemic stroke ([place]) 01/05/2020   Hypertensive emergency 01/26/2017   TIA (transient ischemic attack) 01/26/2017   Hypothyroidism 01/26/2017   Aphasia    Essential hypertension    Diabetes mellitus without complication ([place])     Hall Busing, PT, DPT 03/15/2021, 10:27 AM  [place]. [place], [place], [address] Phone: [phone]   Fax:  [phone]  Name: Jill Rose MRN: [id number] Date of Birth: 14-Jun-1941

## 2021-03-22 ENCOUNTER — Ambulatory Visit: Payer: Medicare Other | Admitting: Rehabilitative and Restorative Service Providers"

## 2021-03-28 ENCOUNTER — Encounter: Payer: Self-pay | Admitting: Physical Therapy

## 2021-03-28 ENCOUNTER — Other Ambulatory Visit: Payer: Self-pay

## 2021-03-28 ENCOUNTER — Ambulatory Visit: Payer: Medicare Other | Attending: Physical Medicine & Rehabilitation | Admitting: Physical Therapy

## 2021-03-28 DIAGNOSIS — I69354 Hemiplegia and hemiparesis following cerebral infarction affecting left non-dominant side: Secondary | ICD-10-CM | POA: Insufficient documentation

## 2021-03-28 DIAGNOSIS — I63511 Cerebral infarction due to unspecified occlusion or stenosis of right middle cerebral artery: Secondary | ICD-10-CM | POA: Insufficient documentation

## 2021-03-28 DIAGNOSIS — R278 Other lack of coordination: Secondary | ICD-10-CM | POA: Diagnosis present

## 2021-03-28 DIAGNOSIS — R2681 Unsteadiness on feet: Secondary | ICD-10-CM | POA: Diagnosis present

## 2021-03-28 DIAGNOSIS — M6281 Muscle weakness (generalized): Secondary | ICD-10-CM | POA: Insufficient documentation

## 2021-03-28 DIAGNOSIS — R2689 Other abnormalities of gait and mobility: Secondary | ICD-10-CM | POA: Diagnosis present

## 2021-03-28 NOTE — Therapy (Signed)
[facility] [place]. [place], [place], [address] Phone: [phone]   Fax:  [phone]  Physical Therapy Treatment  Patient Details  Name: Jill Rose MRN: [id number] Date of Birth: 1941-06-08 Referring Provider (PT): Jamse Arn, MD - PM&R  Garvin Fila, MD - Neuro   Encounter Date: 03/28/2021   PT End of Session - 03/28/21 1633     Visit Number 16    Number of Visits 20    Date for PT Re-Evaluation 04/11/21    Authorization Type UHC MCR    PT Start Time 1450    PT Stop Time 1534    PT Time Calculation (min) 44 min    Equipment Utilized During Treatment Gait belt    Activity Tolerance Patient tolerated treatment well;Patient limited by fatigue    Behavior During Therapy Flat affect             Past Medical History:  Diagnosis Date   Diabetes mellitus without complication ([place])    Essential hypertension    High cholesterol    Hypothyroidism    Stroke [facility])    TIA (transient ischemic attack) 2018    History reviewed. No pertinent surgical history.  There were no vitals filed for this visit.   Subjective Assessment - 03/28/21 1458     Subjective I need help to do stuff at home with the exercises    Currently in Pain? No/denies                               [facility] Adult PT Treatment/Exercise - 03/28/21 0001       Bed Mobility   Bed Mobility Rolling Right;Sit to Supine;Supine to Sit    Rolling Right Contact Guard/Touching assist;Supervision/verbal cueing    Supine to Sit Moderate Assistance - Patient 50-74%;Supervision/Verbal cueing    Sit to Supine Moderate Assistance - Patient 50-74%;Supervision/Verbal cueing      Ambulation/Gait   Gait Comments with FWW, PT using gait belt and hand to assist the weight shift side to side to facilitate the step, verebal cues as well, we were able to do this 12 feet x 4, PT also using my body behind her to have better control and safety,  someone following close with w/c      Lumbar Exercises: Aerobic   Nustep L5 x 6 min BUE/LE      Lumbar Exercises: Supine   Other Supine Lumbar Exercises feet on ball K2C, trunk rotation, small bridge and isometric abs, partial sit up and reach working on core      Knee/Hip Exercises: Standing   Other Standing Knee Exercises in bbars and then using the walker toe touches on 2" and 4" alternating some PT assist at first to block the knees as she was afraid they would give out., once we got going she could do without assist, just verbal cues      Knee/Hip Exercises: Seated   Long Arc Quad Both;2 sets;10 reps    Other Seated Knee/Hip Exercises heel and toe raise    Marching Both;2 sets;10 reps                       PT Short Term Goals - 02/07/21 1322       PT SHORT TERM GOAL #1   Title Independent with initial HEP with caregiver assistance    Status Achieved  PT Long Term Goals - 03/28/21 1637       PT LONG TERM GOAL #1   Title Pt will be independent with final HEP in order to build upon functional gains made in therapy.    Status Partially Met      PT LONG TERM GOAL #2   Title Attain full TUG time and decrease TUG time by at least 50%  with RW to demo decr fall risk    Status On-going      PT LONG TERM GOAL #3   Title Pt will ambulate at least 49' consecutively with RW with min guard in order to demo improved household mobility.    Status On-going                   Plan - 03/28/21 8841     Clinical Impression Statement I really worked with patient today on being able to lift the leg to advance, her issue seemed to be unable to bear weight on the left due to pain and weakness, I started out by blocking it and then advanced to having her do on own.  We also really did some walking with PT providing weight shift to facilitate the advancement of the LE.  Worked on bed mobility as she reports that her husband has to help her.    PT Next  Visit Plan Continue to reinforce HEP carryover for home to facilitate consistent strengthening and wellness. Sesson with focus on transfer/standing stability, gait training working on Facilities manager and Agree with Plan of Care Patient;Family member/caregiver    Family Member Consulted Caregiver/CNA Ranay             Patient will benefit from skilled therapeutic intervention in order to improve the following deficits and impairments:  Abnormal gait, Difficulty walking, Impaired UE functional use, Decreased endurance, Decreased balance, Decreased mobility, Decreased strength, Postural dysfunction, Pain, Impaired vision/preception, Impaired tone  Visit Diagnosis: Muscle weakness (generalized)  Right middle cerebral artery stroke (HCC)  Unsteadiness on feet  Other lack of coordination  Other abnormalities of gait and mobility  Hemiplegia and hemiparesis following cerebral infarction affecting left non-dominant side [facility])     Problem List Patient Active Problem List   Diagnosis Date Noted   Primary osteoarthritis of left knee 08/30/2020   Controlled type 2 diabetes mellitus with hyperglycemia, without long-term current use of insulin ([place]) 04/06/2020   Abnormality of gait 02/03/2020   Benign essential HTN    CKD (chronic kidney disease), stage II    Hemiparesis affecting left side as late effect of stroke (HCC)    Labile blood glucose    Labile blood pressure    Elevated BUN    Hyponatremia    Diabetic peripheral neuropathy (HCC)    Right middle cerebral artery stroke ([place]) 01/14/2020   Dyslipidemia    Class 1 obesity due to excess calories with serious comorbidity and body mass index (BMI) of 30.0 to 30.9 in adult    Left hemiparesis (HCC)    Controlled type 2 diabetes mellitus with hyperglycemia (HCC)    Morbid obesity (HCC)    Leukocytosis    History of TIA (transient ischemic attack)    Acute CVA (cerebrovascular accident) ([place]) 01/06/2020   Acute  ischemic stroke ([place]) 01/05/2020   Hypertensive emergency 01/26/2017   TIA (transient ischemic attack) 01/26/2017   Hypothyroidism 01/26/2017   Aphasia    Essential hypertension    Diabetes mellitus without complication ([place])  Sumner Boast, PT 03/28/2021, 4:38 PM  [place]. [place], [place], [address] Phone: [phone]   Fax:  [phone]  Name: Jill Rose MRN: [id number] Date of Birth: 23-Feb-1941

## 2021-03-29 ENCOUNTER — Ambulatory Visit: Payer: Medicare Other | Admitting: Rehabilitative and Restorative Service Providers"

## 2021-04-04 ENCOUNTER — Ambulatory Visit: Payer: Medicare Other | Admitting: Physical Therapy

## 2021-04-05 ENCOUNTER — Ambulatory Visit: Payer: Medicare Other | Admitting: Physical Therapy

## 2021-04-11 ENCOUNTER — Ambulatory Visit: Payer: Medicare Other | Admitting: Physical Therapy

## 2021-04-12 ENCOUNTER — Encounter: Payer: Self-pay | Admitting: Adult Health

## 2021-04-12 ENCOUNTER — Ambulatory Visit: Payer: Medicare Other | Admitting: Adult Health

## 2021-04-12 NOTE — Progress Notes (Deleted)
[facility] [address] [place]. [place] [address] [phone]       OFFICE FOLLOW-UP NOTE  [name] Date of Birth:  1941/04/02 Medical Record Number:  [id number]   Primary neurologist: Dr. Pearlean Brownie PCP: Gillian Scarce, MD    Reason for visit: Stroke follow-up   No chief complaint on file.    HPI:    Initial visit 04/08/2020 Dr. Pearlean Brownie: Jill Rose is a 80 year old Jill Rose who is seen for first office follow-up visit following hospital consultation for stroke.  She is accompanied by her daughter.  History is obtained from them, review of electronic medical records and I personally reviewed pertinent available imaging films in PACS.  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  She presented on 01/05/2020 to [facility] with left-sided weakness which started a week prior to presentation.  She was not able to tell initially whether this was due to her arthritis or a stroke.  She continued to have left-sided problems and getting in and out of bed in chair and also had some slurred speech eventually prompting hospital visit.  CT scan of the head showed no acute abnormality but old left basal ganglia lacunar infarct.  MRI scan confirmed acute right corona radiata and posterior right temporal and occipital white matter infarcts.  There is also old bilateral corona radiata, left thalamus and left paramedian pontine lacunar infarcts.  MRI of the brain showed multifocal intracranial atherosclerotic changes and MRA of the neck showed 25% right ICA and left vertebral artery atherosclerotic multifocal narrowing.  2D echo showed normal ejection fraction without cardiac source of embolism.  LDL cholesterol of 196 mg percent and hemoglobin A1c was 7.6.  Jill Rose was started on dual antiplatelet therapy aspirin Plavix for 3 months and was transferred to inpatient rehab.  She  is finished rehab stay is currently living at home.  She is doing outpatient physical occupational therapy.  She is able to walk with a walker.  She is shown some progressive improvement and can get in and out of her couch in bed by herself.  She still has mild residual left-sided weakness but it is improving.  The family feels that the Jill Rose is depressed she gets frustrated easily and she feels she is very poor stamina.  He saw primary care physician last week and she was prescribed Lexapro but she has not yet started it.  She is living at home with her husband.  They have a CNA who comes in for few hours every day.  Jill Rose's husband is also having cognitive issues and daughter is concerned about the long-term care.  Jill Rose also notices increased forgetfulness and short-term memory difficulties.  She has not yet had any lab work for evaluation for reversible causes. Update 05/04/2020 Dr. Pearlean Brownie: She returns for follow-up after last visit a month ago.  She is accompanied by her son and husband.  They feel there has been slight regression in her memory and cognitive issues.  She keeps on asking the same question over and over again.  She has poor appetite and has low energy level.  She has regressed with her walking and refuses to walk and has extreme fear of falling.  She is getting some home physical and occupational therapy and gets up only with assistance.  She did undergo lab work at last visit and B12, RPR and homocystine were normal.  TSH was  slightly elevated and she has seen her primary care physician since who has adjusted her thyroid medications.  EEG showed mild generalized slowing with 6 to 7 Hz theta activity throughout.  No definite epileptiform activity was noted.  She is still has some dragging of the right foot for walking and is scared that she will fall.  She does have a walker but does not use it a lot.  She is tolerating Plavix well without bruising or bleeding.  Blood pressures well  controlled today it is 137/72.  Last hemoglobin A1c was 7.3 and fasting sugars are usually in the 150s.  She has no family history of dementia.  She has never tried Aricept or Namenda like medications but is willing to try them now.  She was seen in the ER briefly a few weeks ago for transient confusion which resolved and she left without any significant work-up being done. Update 10/05/2020 Dr. Pearlean Brownie: She returns for follow-up after last visit 4 months ago.  She is accompanied by her son.  She has tolerated Namenda well without any dizziness, sleepiness or other side effects.  She has noticed improvement in her communication and speaking as well as short-term memory.  She still has trouble with multitasking and gets easy distractibility.  The daughter had some concerns as to whether she was depressed and had called requesting referral to neuropsychologist but I was unable to speak to her when I called and left message.  She still continues to spend most of the time in a wheelchair and she has intense fear of walking and falling and hence does not walk even with a walker.  She is mostly calm and composed and does not have any agitation, delusions or hallucinations.  She requires 24-hour care.  She remains on Plavix which is tolerating well without bruising or bleeding.  Blood pressure is usually well controlled today it is elevated in office at 183/84.  Sugar and cholesterol have been under good control.  No new complaints today.  Mini-Mental status exam today is 23/30 which is slightly improved from 21/30 in September 21  Update 04/12/2021 JM: Returns for 76-month follow-up.  Overall stable.  Currently working with PT for residual left hemiparesis ***.  Cognition stable on Namenda tolerating without side effects.  No behavioral concerns.  Continues to receive 24-hour care/supervision.  Compliant on Plavix and atorvastatin without side effects.  Blood pressure today ***.     MMSE - Mini Mental State Exam  10/05/2020 04/08/2020  Orientation to time 4 4  Orientation to Place 5 4  Registration 3 3  Attention/ Calculation 3 1  Recall 1 2  Language- name 2 objects 2 2  Language- repeat 1 0  Language- follow 3 step command 3 3  Language- read & follow direction 0 1  Write a sentence 1 1  Copy design 0 0  Copy design-comments - 4 animals  Total score 23 21          ROS:   14 system review of systems is positive for memory loss, difficulty multitasking fear of walking, depression, frustration, irritability, weakness, gait imbalance, ringing sound in the ears, discomfort all other systems negative PMH:  Past Medical History:  Diagnosis Date   Diabetes mellitus without complication (HCC)    Essential hypertension    High cholesterol    Hypothyroidism    Stroke [facility])    TIA (transient ischemic attack) 2018    Social History:  Social History   Socioeconomic History  Marital status: Married    Spouse name: John   Number of children: Not on file   Years of education: Not on file   Highest education level: Not on file  Occupational History   Not on file  Tobacco Use   Smoking status: Never   Smokeless tobacco: Never  Vaping Use   Vaping Use: Never used  Substance and Sexual Activity   Alcohol use: No   Drug use: No   Sexual activity: Not on file  Other Topics Concern   Not on file  Social History Narrative   Lives with spouse   Right Handed   Drinks 1 cup caffeine/daily   Social Determinants of Health   Financial Resource Strain: Not on file  Food Insecurity: Not on file  Transportation Needs: Not on file  Physical Activity: Not on file  Stress: Not on file  Social Connections: Not on file  Intimate Partner Violence: Not on file    Medications:   Current Outpatient Medications on File Prior to Visit  Medication Sig Dispense Refill   acetaminophen (TYLENOL) 325 MG tablet Take 2 tablets (650 mg total) by mouth every 4 (four) hours as needed for mild pain (or  temp > 37.5 C (99.5 F)).     amLODipine (NORVASC) 10 MG tablet Take 1 tablet (10 mg total) by mouth daily. 30 tablet 0   atorvastatin (LIPITOR) 80 MG tablet Take 1 tablet (80 mg total) by mouth daily. 90 tablet 0   b complex vitamins tablet Take 1 tablet by mouth daily with lunch.     Barberry-Oreg Grape-Goldenseal (BERBERINE COMPLEX PO) Take 1 tablet by mouth daily with lunch.      Cholecalciferol (VITAMIN D3 PO) Take 1 tablet by mouth daily with lunch.      clopidogrel (PLAVIX) 75 MG tablet Take 1 tablet (75 mg total) by mouth daily. 30 tablet 2   Cyanocobalamin (VITAMIN B-12 PO) Take 1 tablet by mouth daily with lunch.     diclofenac Sodium (VOLTAREN) 1 % GEL APPLY 2 GRAMS TO AFFECTED AREA 4 TIMES A DAY 300 g 1   escitalopram (LEXAPRO) 5 MG tablet Take 5 mg by mouth every morning.     hydrALAZINE (APRESOLINE) 10 MG tablet Take 1 tablet (10 mg total) by mouth at bedtime. 30 tablet 0   levothyroxine (SYNTHROID) 100 MCG tablet Take 100 mcg by mouth daily.     levothyroxine (SYNTHROID) 88 MCG tablet Take 1 tablet (88 mcg total) by mouth daily before breakfast. 30 tablet 0   memantine (NAMENDA TITRATION PACK) tablet pack PLEASE SEE ATTACHED FOR DETAILED DIRECTIONS     memantine (NAMENDA) 10 MG tablet Take 10 mg by mouth 2 (two) times daily.     metFORMIN (GLUCOPHAGE) 500 MG tablet Take 1 tablet (500 mg total) by mouth 2 (two) times daily with a meal. 60 tablet 0   ONETOUCH VERIO test strip 1 each daily.     telmisartan (MICARDIS) 80 MG tablet Take 80 mg by mouth daily.     telmisartan-hydrochlorothiazide (MICARDIS HCT) 80-12.5 MG tablet Take 1 tablet by mouth daily.     No current facility-administered medications on file prior to visit.    Allergies:   Allergies  Allergen Reactions   Ammonia Shortness Of Breath, Itching, Swelling and Other (See Comments)    Confusion, Headache     Peroxide [Hydrogen Peroxide] Shortness Of Breath, Itching, Swelling and Other (See Comments)    Confusion,  Headache      Physical  Exam There were no vitals filed for this visit. There is no height or weight on file to calculate BMI.   General: well developed, well nourished elderly Jill Martin Asian Rose, seated, in no evident distress Head: head normocephalic and atraumatic.  Neck: supple with no carotid or supraclavicular bruits Cardiovascular: regular rate and rhythm, no murmurs Musculoskeletal: no deformity Skin:  no rash/petichiae Vascular:  Normal pulses all extremities  Neurologic Exam Mental Status: Awake and fully alert. Oriented to place and time. Recent and remote memory intact. Attention span, concentration and fund of knowledge appropriate. Mood and affect appropriate.  Diminished recall 1/3.  Able to name  9 animals that can walk on 4 legs.  Difficulty in copying intersecting pentagons.  Clock drawing 3/4.  She was able to copy intersecting pentagons well.  On Mini-Mental status exam she scored 23/30 with 1 deficit in orientation, 2 in recall and spelling world backwards.  On geriatric depression scale she scored 4 which is not suggestive of depression. Cranial Nerves: Pupils equal, briskly reactive to light. Extraocular movements full without nystagmus. Visual fields full to confrontation. Hearing intact. Facial sensation intact. Face, tongue, palate moves normally and symmetrically.  Motor: Normal bulk and tone. Normal strength in all tested extremity muscles except mild left hemiparesis 4+/5 strength with weakness of left grip and intrinsic hand muscles.  Orbits right over left upper extremity.  Diminished fine finger movements and foot tapping on the left.. Sensory.: intact to touch ,pinprick .position and vibratory sensation.  Coordination: Mildly impaired finger-to-nose and needle coordination on the left. Gait and Station: Deferred as she did not bring her walker and she is in a wheelchair. Reflexes: 2+ and asymmetric and brisker on the left. Toes downgoing.        ASSESSMENT/PLAN: 80 year old Jill Martin Asian origin Rose with right MCA branch infarcts in June 2021 secondary to intracranial atherosclerosis with vascular risk factors of diabetes, hypertension, hyperlipidemia and atherosclerosis.  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   Continue Plavix for stroke prevention and maintain aggressive risk factor modification strict control of hypertension with blood pressure goal below 130/90, lipids with LDL cholesterol goal below 70 mg percent.       CC:  [facility] provider: Dr. Barnett Hatter, Hinton Dyer, MD   I spent *** minutes of face-to-face and non-face-to-face time with Jill Rose.  This included previsit chart review, lab review, study review, order entry, electronic health record documentation, Jill Rose education  Ihor Austin, [facility]  [facility] [address] [place], [place] [address]  Phone [phone] Fax [phone] Note: This document was prepared with digital dictation and possible smart phrase technology. Any transcriptional errors that result from this process are unintentional.

## 2021-04-18 ENCOUNTER — Other Ambulatory Visit: Payer: Self-pay

## 2021-04-18 ENCOUNTER — Encounter: Payer: Self-pay | Admitting: Physical Therapy

## 2021-04-18 ENCOUNTER — Ambulatory Visit: Payer: Medicare Other | Attending: Physical Medicine & Rehabilitation | Admitting: Physical Therapy

## 2021-04-18 DIAGNOSIS — R2689 Other abnormalities of gait and mobility: Secondary | ICD-10-CM | POA: Insufficient documentation

## 2021-04-18 DIAGNOSIS — I63511 Cerebral infarction due to unspecified occlusion or stenosis of right middle cerebral artery: Secondary | ICD-10-CM | POA: Diagnosis present

## 2021-04-18 DIAGNOSIS — I69354 Hemiplegia and hemiparesis following cerebral infarction affecting left non-dominant side: Secondary | ICD-10-CM | POA: Diagnosis present

## 2021-04-18 DIAGNOSIS — R278 Other lack of coordination: Secondary | ICD-10-CM | POA: Insufficient documentation

## 2021-04-18 DIAGNOSIS — M6281 Muscle weakness (generalized): Secondary | ICD-10-CM | POA: Diagnosis present

## 2021-04-18 DIAGNOSIS — R2681 Unsteadiness on feet: Secondary | ICD-10-CM | POA: Insufficient documentation

## 2021-04-18 NOTE — Therapy (Signed)
[place] [place], [place], [address] Phone: [phone]   Fax:  [phone]  Physical Therapy Discharge Summary  Patient Details  Name: Jill Rose MRN: [id number] Date of Birth: [birth year] Referring Provider (PT): Jamse Arn, MD - PM&R  Garvin Fila, MD - Neuro   Progress Note Reporting Period 02/22/21 to 04/18/21  See note below for Objective Data and Assessment of Progress/Goals.    Encounter Date: 04/18/2021   PT End of Session - 04/18/21 1534     Visit Number 17    Number of Visits 20    Date for PT Re-Evaluation 04/11/21    Authorization Type U[employer] MCR    PT Start Time 1450   pt late   PT Stop Time 1528    PT Time Calculation (min) 38 min    Equipment Utilized During Treatment Gait belt    Activity Tolerance Patient tolerated treatment well;Patient limited by fatigue    Behavior During Therapy Flat affect             Past Medical History:  Diagnosis Date   Diabetes mellitus without complication ([place])    Essential hypertension    High cholesterol    Hypothyroidism    Stroke [facility])    TIA (transient ischemic attack) 2018    History reviewed. No pertinent surgical history.  There were no vitals filed for this visit.   Subjective Assessment - 04/18/21 1452     Subjective Last monday she fell d/t her RW and W/C getting tangled. Had some pain in the L hip afterwards. It is better, but still there. Denies hiting her head or any otr injuries. Was alone at the time of the fall, but had husband and someone else's help to get her up.    Pertinent History Stroke 01/05/20 - corona radiata and posterior right temporal and occipital white matter infarcts. 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 Strict HTN control BP to be kept below 130/90    Patient Stated Goals to get back to walking, get stronger    Currently in Pain? Yes    Pain Score 4     Pain Location Hip    Pain  Orientation Right    Pain Descriptors / Indicators Nagging    Pain Type Acute pain                [facility] PT Assessment - 04/18/21 0001       Standardized Balance Assessment   Standardized Balance Assessment Five Times Sit to Stand    Five times sit to stand comments  19.01 sec reaching B hands to II bars                           [facility] Adult PT Treatment/Exercise - 04/18/21 0001       Transfers   Transfer Cueing stand pivot transfer from [facility] <> nustep with mod A x2 cues required for reaching and lifting feet when turning.      Neuro Re-ed    Neuro Re-ed Details  R/L wt shifting in II bars with CGA/min A to encourage motion 3x10; walking 6x length of parallel bars with min A and heavy verbal and manual cues to encourage weight shift and forward stepping- very minimal foot clearance and step length; min A and verbal/tactile cueing required for turning in II bars      Lumbar Exercises: Aerobic  Nustep L5 x 6 min BUE/LE                     PT Education - 04/18/21 1532     Education Details discussion with patient and caregiver Jill Rose about patient's current impairments and plateau in therapy; discussed importance of HEP compliance- patient reported that she was not doing her HEP at home d/t lack of help, however caregiver reports that that patient has dementia and cannot remember, and that HEP is being performed at home 3x/week    Person(s) Educated Patient;Caregiver(s)    Methods Explanation;Tactile cues;Verbal cues    Comprehension Verbalized understanding              PT Short Term Goals - 04/18/21 1535       PT SHORT TERM GOAL #1   Title Independent with initial HEP with caregiver assistance    Status Achieved               PT Long Term Goals - 04/18/21 1535       PT LONG TERM GOAL #1   Title Pt will be independent with final HEP in order to build upon functional gains made in therapy.    Status Achieved   per caregiver, this  is performed 3x/week and plan to come in to use Nustep every Monday     PT LONG TERM GOAL #2   Title Attain full TUG time and decrease TUG time by at least 50%  with RW to demo decr fall risk    Status Not Met   patient limited to 5 feet of ambulation in II bars today d/t poor foot clearance, fatigue, pain     PT LONG TERM GOAL #3   Title Pt will ambulate at least 108' consecutively with RW with min guard in order to demo improved household mobility.    Status Not Met   patient limited to 5 feet of ambulation in II bars today d/t poor foot clearance, fatigue, pain     PT LONG TERM GOAL #4   Title Pt will decr 5x sit <> stand time to 40 seconds or less from mat table with UE support in order to demo improved functional BLE strength.    Status Achieved   19 seconds from w/c using II bars with heavy UE dependence                  Plan - 04/18/21 1534     Clinical Impression Statement Patient arrived to session with caregiver with report of having had a fall last Monday, resulting in L hip pain. Denies hitting her head or experiencing other injuries. Notes that she was assisted off the floor by her husband and someone else at the home. 5xSTS was performed with much improved speed from previous sessions, possibly d/t heavy UE use. Pre-gait and gait training with addition of turns was performed in parallel bars today. Patient demonstrated very minimal foot clearance and step length throughout, requiring min A and heavy verbal and tactile cues to improve. Patient reported L hip pain and fatigue limiting her with these activities today. At this point patient has received max benefit from therapy d/t plateau in progress. Discussed this with patient and caregiver, who both are in agreement. Discussed importance of HEP compliance at home- patient reported that she was not performing HEP. However when discussed with caregiver Jill Rose, she reports that patient has hx of dementia and in fact is performing  HEP with  her help 3x/week. Caregiver reported good understanding of HEP in order to continue working with patient on this. At this time patient is ready for d/t with transition to HEP.    PT Treatment/Interventions ADLs/Self Care Home Management;Cryotherapy;Electrical Stimulation;Moist Heat;Gait training;DME Instruction;Neuromuscular re-education;Therapeutic exercise;Therapeutic activities;Functional mobility training;Patient/family education;Manual techniques;Energy conservation;Orthotic Fit/Training;Vestibular;Taping;Balance training;Passive range of motion;Dry needling    PT Next Visit Plan DC at this time    Consulted and Agree with Plan of Care Patient;Family member/caregiver    Family Member Consulted Caregiver/CNA Jill Rose             Patient will benefit from skilled therapeutic intervention in order to improve the following deficits and impairments:  Abnormal gait, Difficulty walking, Impaired UE functional use, Decreased endurance, Decreased balance, Decreased mobility, Decreased strength, Postural dysfunction, Pain, Impaired vision/preception, Impaired tone  Visit Diagnosis: Muscle weakness (generalized)  Right middle cerebral artery stroke (HCC)  Unsteadiness on feet  Other lack of coordination  Other abnormalities of gait and mobility  Hemiplegia and hemiparesis following cerebral infarction affecting left non-dominant side [facility])     Problem List Patient Active Problem List   Diagnosis Date Noted   Primary osteoarthritis of left knee 08/30/2020   Controlled type 2 diabetes mellitus with hyperglycemia, without long-term current use of insulin ([place]) 04/06/2020   Abnormality of gait 02/03/2020   Benign essential HTN    CKD (chronic kidney disease), stage II    Hemiparesis affecting left side as late effect of stroke (HCC)    Labile blood glucose    Labile blood pressure    Elevated BUN    Hyponatremia    Diabetic peripheral neuropathy (HCC)    Right middle cerebral  artery stroke ([place]) 01/14/2020   Dyslipidemia    Class 1 obesity due to excess calories with serious comorbidity and body mass index (BMI) of 30.0 to 30.9 in adult    Left hemiparesis (HCC)    Controlled type 2 diabetes mellitus with hyperglycemia (HCC)    Morbid obesity (HCC)    Leukocytosis    History of TIA (transient ischemic attack)    Acute CVA (cerebrovascular accident) ([place]) 01/06/2020   Acute ischemic stroke ([place]) 01/05/2020   Hypertensive emergency 01/26/2017   TIA (transient ischemic attack) 01/26/2017   Hypothyroidism 01/26/2017   Aphasia    Essential hypertension    Diabetes mellitus without complication ([place])     PHYSICAL THERAPY DISCHARGE SUMMARY  Visits from Start of Care: 17  Current functional level related to goals / functional outcomes: See above clinical impression   Remaining deficits: Difficulty with transfers and gait   Education / Equipment: HEP  Plan: Patient agrees to discharge.  Patient goals were partially met. Patient is being discharged due to plateau in progress.     Janene Harvey, PT, DPT 04/18/21 3:48 PM   [place]. [place], [place], [address] Phone: [phone]   Fax:  [phone]  Name: JASON HAUGE MRN: [id number] Date of Birth: 30-Jan-1941

## 2021-04-19 ENCOUNTER — Ambulatory Visit: Payer: Medicare Other | Admitting: Physical Therapy

## 2021-04-25 ENCOUNTER — Ambulatory Visit: Payer: Medicare Other | Admitting: Physical Therapy

## 2021-04-26 ENCOUNTER — Ambulatory Visit: Payer: Medicare Other | Admitting: Physical Therapy

## 2021-05-02 ENCOUNTER — Ambulatory Visit: Payer: Medicare Other | Admitting: Physical Therapy

## 2021-05-03 ENCOUNTER — Ambulatory Visit: Payer: Medicare Other | Admitting: Physical Therapy

## 2021-05-09 ENCOUNTER — Ambulatory Visit: Payer: Medicare Other | Admitting: Physical Therapy

## 2021-05-10 ENCOUNTER — Ambulatory Visit: Payer: Medicare Other | Admitting: Physical Therapy

## 2021-06-21 ENCOUNTER — Telehealth: Payer: Self-pay | Admitting: Neurology

## 2021-06-21 ENCOUNTER — Encounter: Payer: Self-pay | Admitting: Neurology

## 2021-06-21 ENCOUNTER — Other Ambulatory Visit: Payer: Self-pay | Admitting: *Deleted

## 2021-06-21 MED ORDER — MEMANTINE HCL 10 MG PO TABS: 10.0000 mg | ORAL_TABLET | Freq: Two times a day (BID) | ORAL | 3 refills | Status: DC

## 2021-06-21 MED ORDER — CLOPIDOGREL BISULFATE 75 MG PO TABS: 75.0000 mg | ORAL_TABLET | Freq: Every day | ORAL | 2 refills | Status: DC

## 2021-06-21 NOTE — Telephone Encounter (Signed)
Rx sent in

## 2021-06-21 NOTE — Telephone Encounter (Signed)
Left message for a return call. The Jill Rose refills have been sent to the pharmacy. The patient just needs a follow up scheduled with Dr. Pearlean Brownie.  When she calls back, please schedule follow up.

## 2021-06-21 NOTE — Telephone Encounter (Signed)
At 10:54 pt's daughter left a vm in response to my chart message she received re: pt's:clopidogrel (PLAVIX) 75 MG tablet Pt's daughter is asking for a call back

## 2021-06-21 NOTE — Telephone Encounter (Signed)
Pt's daughter called back requesting refill for the memantine (NAMENDA) 10 MG tablet. Pharmacy [phone] - [place], [place] - [address]

## 2021-08-27 ENCOUNTER — Encounter: Payer: Self-pay | Admitting: Neurology

## 2021-08-29 ENCOUNTER — Other Ambulatory Visit: Payer: Self-pay | Admitting: *Deleted

## 2021-08-29 MED ORDER — CLOPIDOGREL BISULFATE 75 MG PO TABS: 75.0000 mg | ORAL_TABLET | Freq: Every day | ORAL | 2 refills | Status: DC

## 2021-08-29 MED ORDER — MEMANTINE HCL 10 MG PO TABS: 10.0000 mg | ORAL_TABLET | Freq: Two times a day (BID) | ORAL | 2 refills | Status: DC

## 2021-11-01 ENCOUNTER — Ambulatory Visit: Payer: Medicare Other | Admitting: Neurology

## 2021-11-01 ENCOUNTER — Encounter: Payer: Self-pay | Admitting: Neurology

## 2021-12-04 ENCOUNTER — Other Ambulatory Visit: Payer: Self-pay | Admitting: Neurology

## 2021-12-13 ENCOUNTER — Other Ambulatory Visit: Payer: Self-pay | Admitting: Neurology

## 2021-12-13 ENCOUNTER — Telehealth: Payer: Self-pay | Admitting: Neurology

## 2021-12-13 MED ORDER — CLOPIDOGREL BISULFATE 75 MG PO TABS: 75.0000 mg | ORAL_TABLET | Freq: Every day | ORAL | 0 refills | Status: DC

## 2021-12-13 NOTE — Telephone Encounter (Signed)
Called the daughter back, I was able to get her worked in for sooner apt 6/27 with Shanda Bumps NP. Will send the refill to last her til that appt

## 2021-12-13 NOTE — Telephone Encounter (Signed)
Pt is needing a refill request for her clopidogrel (PLAVIX) 75 MG tablet [facility] [address]

## 2021-12-14 ENCOUNTER — Encounter: Payer: Self-pay | Admitting: Neurology

## 2022-01-08 ENCOUNTER — Other Ambulatory Visit: Payer: Self-pay | Admitting: Neurology

## 2022-01-10 ENCOUNTER — Ambulatory Visit: Payer: Medicare Other | Admitting: Adult Health

## 2022-01-10 ENCOUNTER — Encounter: Payer: Self-pay | Admitting: Adult Health

## 2022-01-10 VITALS — BP 159/71 | HR 85

## 2022-01-10 DIAGNOSIS — G309 Alzheimer's disease, unspecified: Secondary | ICD-10-CM

## 2022-01-10 DIAGNOSIS — F0150 Vascular dementia without behavioral disturbance: Secondary | ICD-10-CM

## 2022-01-10 DIAGNOSIS — Z8673 Personal history of transient ischemic attack (TIA), and cerebral infarction without residual deficits: Secondary | ICD-10-CM

## 2022-01-10 DIAGNOSIS — F0280 Dementia in other diseases classified elsewhere without behavioral disturbance: Secondary | ICD-10-CM

## 2022-01-10 MED ORDER — CLOPIDOGREL BISULFATE 75 MG PO TABS: 75.0000 mg | ORAL_TABLET | Freq: Every day | ORAL | 0 refills | Status: DC

## 2022-01-10 MED ORDER — MEMANTINE HCL 10 MG PO TABS: 10.0000 mg | ORAL_TABLET | Freq: Two times a day (BID) | ORAL | 3 refills | Status: DC

## 2022-01-21 ENCOUNTER — Other Ambulatory Visit: Payer: Self-pay | Admitting: Neurology

## 2022-03-21 ENCOUNTER — Encounter: Payer: Self-pay | Admitting: Neurology

## 2022-03-21 ENCOUNTER — Ambulatory Visit: Payer: Medicare Other | Admitting: Neurology

## 2022-03-21 VITALS — BP 116/58 | HR 76

## 2022-03-21 DIAGNOSIS — Z8673 Personal history of transient ischemic attack (TIA), and cerebral infarction without residual deficits: Secondary | ICD-10-CM | POA: Diagnosis not present

## 2022-03-21 DIAGNOSIS — G309 Alzheimer's disease, unspecified: Secondary | ICD-10-CM

## 2022-03-21 DIAGNOSIS — F0150 Vascular dementia without behavioral disturbance: Secondary | ICD-10-CM

## 2022-03-21 DIAGNOSIS — F0280 Dementia in other diseases classified elsewhere without behavioral disturbance: Secondary | ICD-10-CM | POA: Diagnosis not present

## 2022-03-21 NOTE — Progress Notes (Addendum)
[facility] [address] [place]. [place] [address] [phone]       OFFICE FOLLOW-UP NOTE  [name] Date of Birth:  09/23/1940 Medical Record Number:  [id number]   HPI: Initial visit 04/08/2020 Jill Rose is a 81 year old [place] origin lady who is seen for first office follow-up visit following hospital consultation for stroke.  She is accompanied by her daughter.  History is obtained from them, review of electronic medical records and I personally reviewed pertinent available imaging films in PACS.  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  She presented on 01/05/2020 to [facility] with left-sided weakness which started a week prior to presentation.  She was not able to tell initially whether this was due to her arthritis or a stroke.  She continued to have left-sided problems and getting in and out of bed in chair and also had some slurred speech eventually prompting hospital visit.  CT scan of the head showed no acute abnormality but old left basal ganglia lacunar infarct.  MRI scan confirmed acute right corona radiata and posterior right temporal and occipital white matter infarcts.  There is also old bilateral corona radiata, left thalamus and left paramedian pontine lacunar infarcts.  MRI of the brain showed multifocal intracranial atherosclerotic changes and MRA of the neck showed 25% right ICA and left vertebral artery atherosclerotic multifocal narrowing.  2D echo showed normal ejection fraction without cardiac source of embolism.  LDL cholesterol of 196 mg percent and hemoglobin A1c was 7.6.  Patient was started on dual antiplatelet therapy aspirin Plavix for 3 months and was transferred to inpatient rehab.  She is finished rehab stay is currently living at home.  She is doing outpatient physical occupational therapy.  She is able to walk with a walker.  She is  shown some progressive improvement and can get in and out of her couch in bed by herself.  She still has mild residual left-sided weakness but it is improving.  The family feels that the patient is depressed she gets frustrated easily and she feels she is very poor stamina.  He saw primary care physician last week and she was prescribed Lexapro but she has not yet started it.  She is living at home with her husband.  They have a CNA who comes in for few hours every day.  Patient's husband is also having cognitive issues and daughter is concerned about the long-term care.  Patient also notices increased forgetfulness and short-term memory difficulties.  She has not yet had any lab work for evaluation for reversible causes. Update 05/04/2020 : She returns for follow-up after last visit a month ago.  She is accompanied by her son and husband.  They feel there has been slight regression in her memory and cognitive issues.  She keeps on asking the same question over and over again.  She has poor appetite and has low energy level.  She has regressed with her walking and refuses to walk and has extreme fear of falling.  She is getting some home physical and occupational therapy and gets up only with assistance.  She did undergo lab work at last visit and B12, RPR and homocystine were normal.  TSH was slightly elevated and she has seen her primary care physician since who has adjusted her thyroid medications.  EEG showed mild generalized slowing with 6 to 7 Hz theta activity throughout.  No  definite epileptiform activity was noted.  She is still has some dragging of the right foot for walking and is scared that she will fall.  She does have a walker but does not use it a lot.  She is tolerating Plavix well without bruising or bleeding.  Blood pressures well controlled today it is 137/72.  Last hemoglobin A1c was 7.3 and fasting sugars are usually in the 150s.  She has no family history of dementia.  She has never tried  Aricept or Namenda like medications but is willing to try them now.  She was seen in the ER briefly a few weeks ago for transient confusion which resolved and she left without any significant work-up being done. Update 10/05/2020 : She returns for follow-up after last visit 4 months ago.  She is accompanied by her son.  She has tolerated Namenda well without any dizziness, sleepiness or other side effects.  She has noticed improvement in her communication and speaking as well as short-term memory.  She still has trouble with multitasking and gets easy distractibility.  The daughter had some concerns as to whether she was depressed and had called requesting referral to neuropsychologist but I was unable to speak to her when I called and left message.  She still continues to spend most of the time in a wheelchair and she has intense fear of walking and falling and hence does not walk even with a walker.  She is mostly calm and composed and does not have any agitation, delusions or hallucinations.  She requires 24-hour care.  She remains on Plavix which is tolerating well without bruising or bleeding.  Blood pressure is usually well controlled today it is elevated in office at 183/84.  Sugar and cholesterol have been under good control.  No new complaints today.  Mini-Mental status exam today is 23/30 which is slightly improved from 21/30 in September 21   Update 01/09/2022 JM: Patient returns for follow-up after prior visit over 1 year ago with Dr. Pearlean Brownie (although 6 mo f/u visit requested). Accompanied by caregiver. Overall stable.  Reports cognition has been stable since prior visit.  Did work with SLP a few months ago with caregiver noting some improvement of cognition.  MMSE today 23/30 (prior 23/30). remains on Namenda 10 mg twice daily, denies side effects.  Remains sedentary with limited ambulation.  Completed PT 04/2021 as she plateaued in progress and recommended continued HEP. Requires 24-hour supervision  and assistance for all ADLs.  She has caregiver assistance 5 days weekly.  Denies any behavioral concerns.  Sleeps well.  Has been experiencing decreased appetite over the past few months.  Denies new stroke/TIA symptoms.  Compliant on Plavix and atorvastatin, denies side effects.  Blood pressure today 159/71.  Routinely monitors at home and typically stable.  She is unsure when she last saw PCP.   No further concerns at this time   Update 03/21/2022 : She returns for follow-up after last visit 2 months ago with Shanda Bumps nurse practitioner.  She is accompanied by her caregiver and also spoke to her daughter over the phone.  Patient seems to be doing all right.  She is cognitively stable.  She remains on Namenda 10 mg twice daily which is tolerating well without side effects.  She gets occasionally disoriented particularly with respect to time but can recognize family members and caregivers on most days.  There are no delusions, hallucinations, behavior agitation or unsafe behavior.  Has no nightmares and sleeps quite well.  She has a caregiver during the day from 8 AM to 8 PM and at night family keeps a close watch remotely cameras.  She stays mostly in bed and does not get out the caregivers arrive in the morning.  Not had any recurrent stroke or TIA symptoms..She remains on Plavix which she is tolerating well without bleeding or bruising.  Blood pressures under good control.  He is tolerating Lipitor well without muscle aches complaints.  His sugars have been in the.  Mental status exam today she scored 24/30 which is stable compared to last visit.  ROS:   14 system review of systems is positive for those listed in HPI and all other systems negative     PMH:  Past Medical History:  Diagnosis Date   Diabetes mellitus without complication (HCC)    Essential hypertension    High cholesterol    Hypothyroidism    Stroke (HCC)    TIA (transient ischemic attack) 2018    Social History:  Social  History   Socioeconomic History   Marital status: Married    Spouse name: John   Number of children: Not on file   Years of education: Not on file   Highest education level: Not on file  Occupational History   Not on file  Tobacco Use   Smoking status: Never   Smokeless tobacco: Never  Vaping Use   Vaping Use: Never used  Substance and Sexual Activity   Alcohol use: No   Drug use: No   Sexual activity: Not on file  Other Topics Concern   Not on file  Social History Narrative   Lives with spouse   Right Handed   Drinks 1 cup caffeine/daily   Social Determinants of Health   Financial Resource Strain: Not on file  Food Insecurity: Not on file  Transportation Needs: Not on file  Physical Activity: Not on file  Stress: Not on file  Social Connections: Not on file  Intimate Partner Violence: Not on file    Medications:   Current Outpatient Medications on File Prior to Visit  Medication Sig Dispense Refill   acetaminophen (TYLENOL) 325 MG tablet Take 2 tablets (650 mg total) by mouth every 4 (four) hours as needed for mild pain (or temp > 37.5 C (99.5 F)).     Cholecalciferol (VITAMIN D3 PO) Take 1 tablet by mouth daily with lunch.      clopidogrel (PLAVIX) 75 MG tablet Take 1 tablet (75 mg total) by mouth daily. 90 tablet 0   diclofenac Sodium (VOLTAREN) 1 % GEL APPLY 2 GRAMS TO AFFECTED AREA 4 TIMES A DAY 300 g 1   escitalopram (LEXAPRO) 5 MG tablet Take 5 mg by mouth every morning.     hydrALAZINE (APRESOLINE) 10 MG tablet Take 1 tablet (10 mg total) by mouth at bedtime. 30 tablet 0   levothyroxine (SYNTHROID) 100 MCG tablet Take 100 mcg by mouth daily.     levothyroxine (SYNTHROID) 88 MCG tablet Take 1 tablet (88 mcg total) by mouth daily before breakfast. 30 tablet 0   memantine (NAMENDA) 10 MG tablet Take 1 tablet (10 mg total) by mouth 2 (two) times daily. 180 tablet 3   ONETOUCH VERIO test strip 1 each daily.     telmisartan (MICARDIS) 80 MG tablet Take 80 mg by  mouth daily.     telmisartan-hydrochlorothiazide (MICARDIS HCT) 80-12.5 MG tablet Take 1 tablet by mouth daily.     amLODipine (NORVASC) 10 MG tablet Take 1 tablet (  10 mg total) by mouth daily. 30 tablet 0   atorvastatin (LIPITOR) 80 MG tablet Take 1 tablet (80 mg total) by mouth daily. 90 tablet 0   metFORMIN (GLUCOPHAGE) 500 MG tablet Take 1 tablet (500 mg total) by mouth 2 (two) times daily with a meal. 60 tablet 0   No current facility-administered medications on file prior to visit.    Allergies:   Allergies  Allergen Reactions   Ammonia Shortness Of Breath, Itching, Swelling and Other (See Comments)    Confusion, Headache     Peroxide [Hydrogen Peroxide] Shortness Of Breath, Itching, Swelling and Other (See Comments)    Confusion, Headache      Physical Exam Today's Vitals   03/21/22 1341  BP: (!) 116/58  Pulse: 76   There is no height or weight on file to calculate BMI.  General: well developed, well nourished elderly pleasant [place] Asian lady, seated, in no evident distress Head: head normocephalic and atraumatic.  Neck: supple with no carotid or supraclavicular bruits Cardiovascular: regular rate and rhythm, no murmurs Musculoskeletal: no deformity Skin:  no rash/petichiae Vascular:  Normal pulses all extremities  Neurologic Exam Mental Status: Awake and fully alert. Oriented to place and time. Recent and remote memory intact. Attention span, concentration and fund of knowledge.  Appropriate during visit. Mood and affect appropriate.  MMSE scored 24/30. Clock drawing 2/4. Animal Naming 9 only.    Cranial Nerves: Pupils equal, briskly reactive to light. Extraocular movements full without nystagmus. Visual fields full to confrontation. Hearing intact. Facial sensation intact. Face, tongue, palate moves normally and symmetrically.  Motor: Normal bulk and tone. Normal strength in all tested extremity muscles except mild left hemiparesis 4+/5 strength with weakness of  left grip and intrinsic hand muscles.  Orbits right over left upper extremity.  Diminished fine finger movements and foot tapping on the left.. Sensory.: intact to touch ,pinprick .position and vibratory sensation.  Coordination: Mildly impaired finger-to-nose and needle coordination on the left. Gait and Station: Deferred  Reflexes: 2+ and asymmetric and brisker on the left. Toes downgoing.      03/21/2022    2:25 PM 01/10/2022    1:01 PM 10/05/2020    2:19 PM  MMSE - Mini Mental State Exam  Orientation to time 4 4 4   Orientation to Place 5 5 5   Registration 3 3 3   Attention/ Calculation 2 1 3   Recall 2 2 1   Language- name 2 objects 2 2 2   Language- repeat 0 1 1  Language- follow 3 step command 3 3 3   Language- read & follow direction 1 1 0  Write a sentence 1 1 1   Copy design 1 0 0  Total score 24 23 23      ASSESSMENT/PLAN: 81 year old Asian origin lady with right MCA branch infarcts in June 2021 secondary to intracranial atherosclerosis with vascular risk factors of diabetes, hypertension, hyperlipidemia and atherosclerosis.  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  Continue Namenda at the current dose of 10 mg twice daily which she is tolerating well.  Definitely 5 patient in cognitively challenging activities as well as physical activity as well.  Continue Plavix for stroke prevention and aggressive  risk factor modification with strict control of hypertension with blood pressure goal below 130/90, lipids with LDL cholesterol goal below 70 mg and diabetes with hemoglobin A1c goal below 6.5%.  She will continue living at home with great daytime caregiver support with close supervision from her  children.  She will return for follow-up in the future in 6 months or call earlier if necessary. Jill Heady, MD [facility] [address] [place], [place] [address]  Phone [phone] Fax [phone] Note: This document was prepared with digital dictation and possible smart phrase technology. Any transcriptional errors that result from this process are unintentional.

## 2022-03-21 NOTE — Patient Instructions (Signed)
I had a long discussion with the patient and her caregiver as well as her daughter whom I spoke to over the phone regarding her dementia which appears to be stable.  Continue Namenda at the current dose of 10 mg twice daily which she is tolerating well.  Definitely 5 patient in cognitively challenging activities as well as physical activity as well.  Continue Plavix for stroke prevention and aggressive risk factor modification with strict control of hypertension with blood pressure goal below 130/90, lipids with LDL cholesterol goal below 70 mg and diabetes with hemoglobin A1c goal below 6.5%.  She will continue living at home with great daytime caregiver support with close supervision from her children.  She will return for follow-up in the future in 6 months or call earlier if necessary.

## 2022-04-12 ENCOUNTER — Encounter: Payer: Self-pay | Admitting: Neurology

## 2022-04-24 MED ORDER — CLOPIDOGREL BISULFATE 75 MG PO TABS: 75.0000 mg | ORAL_TABLET | Freq: Every day | ORAL | 0 refills | Status: DC

## 2022-08-30 NOTE — Progress Notes (Signed)
This encounter was created in error - please disregard.

## 2022-09-18 NOTE — Progress Notes (Unsigned)
[facility] [address] [place]. [place] [address] [phone]       OFFICE FOLLOW-UP NOTE  [name] Date of Birth:  09/23/1940 Medical Record Number:  [id number]   HPI: Initial visit 04/08/2020 Jill Rose is a 82 year old [place] origin lady who is seen for first office follow-up visit following hospital consultation for stroke.  She is accompanied by her daughter.  History is obtained from them, review of electronic medical records and I personally reviewed pertinent available imaging films in PACS.  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  She presented on 01/05/2020 to [facility] with left-sided weakness which started a week prior to presentation.  She was not able to tell initially whether this was due to her arthritis or a stroke.  She continued to have left-sided problems and getting in and out of bed in chair and also had some slurred speech eventually prompting hospital visit.  CT scan of the head showed no acute abnormality but old left basal ganglia lacunar infarct.  MRI scan confirmed acute right corona radiata and posterior right temporal and occipital white matter infarcts.  There is also old bilateral corona radiata, left thalamus and left paramedian pontine lacunar infarcts.  MRI of the brain showed multifocal intracranial atherosclerotic changes and MRA of the neck showed 25% right ICA and left vertebral artery atherosclerotic multifocal narrowing.  2D echo showed normal ejection fraction without cardiac source of embolism.  LDL cholesterol of 196 mg percent and hemoglobin A1c was 7.6.  Patient was started on dual antiplatelet therapy aspirin Plavix for 3 months and was transferred to inpatient rehab.  She is finished rehab stay is currently living at home.  She is doing outpatient physical occupational therapy.  She is able to walk with a walker.  She is  shown some progressive improvement and can get in and out of her couch in bed by herself.  She still has mild residual left-sided weakness but it is improving.  The family feels that the patient is depressed she gets frustrated easily and she feels she is very poor stamina.  He saw primary care physician last week and she was prescribed Lexapro but she has not yet started it.  She is living at home with her husband.  They have a CNA who comes in for few hours every day.  Patient's husband is also having cognitive issues and daughter is concerned about the long-term care.  Patient also notices increased forgetfulness and short-term memory difficulties.  She has not yet had any lab work for evaluation for reversible causes. Update 05/04/2020 : She returns for follow-up after last visit a month ago.  She is accompanied by her son and husband.  They feel there has been slight regression in her memory and cognitive issues.  She keeps on asking the same question over and over again.  She has poor appetite and has low energy level.  She has regressed with her walking and refuses to walk and has extreme fear of falling.  She is getting some home physical and occupational therapy and gets up only with assistance.  She did undergo lab work at last visit and B12, RPR and homocystine were normal.  TSH was slightly elevated and she has seen her primary care physician since who has adjusted her thyroid medications.  EEG showed mild generalized slowing with 6 to 7 Hz theta activity throughout.  No  definite epileptiform activity was noted.  She is still has some dragging of the right foot for walking and is scared that she will fall.  She does have a walker but does not use it a lot.  She is tolerating Plavix well without bruising or bleeding.  Blood pressures well controlled today it is 137/72.  Last hemoglobin A1c was 7.3 and fasting sugars are usually in the 150s.  She has no family history of dementia.  She has never tried  Aricept or Namenda like medications but is willing to try them now.  She was seen in the ER briefly a few weeks ago for transient confusion which resolved and she left without any significant work-up being done. Update 10/05/2020 : She returns for follow-up after last visit 4 months ago.  She is accompanied by her son.  She has tolerated Namenda well without any dizziness, sleepiness or other side effects.  She has noticed improvement in her communication and speaking as well as short-term memory.  She still has trouble with multitasking and gets easy distractibility.  The daughter had some concerns as to whether she was depressed and had called requesting referral to neuropsychologist but I was unable to speak to her when I called and left message.  She still continues to spend most of the time in a wheelchair and she has intense fear of walking and falling and hence does not walk even with a walker.  She is mostly calm and composed and does not have any agitation, delusions or hallucinations.  She requires 24-hour care.  She remains on Plavix which is tolerating well without bruising or bleeding.  Blood pressure is usually well controlled today it is elevated in office at 183/84.  Sugar and cholesterol have been under good control.  No new complaints today.  Mini-Mental status exam today is 23/30 which is slightly improved from 21/30 in September 21  Update 01/09/2022 JM: Patient returns for follow-up after prior visit over 1 year ago with Dr. Leonie Man (although 6 mo f/u visit requested). Accompanied by caregiver. Overall stable.  Reports cognition has been stable since prior visit.  Did work with SLP a few months ago with caregiver noting some improvement of cognition.  MMSE today 23/30 (prior 23/30). remains on Namenda 10 mg twice daily, denies side effects.  Remains sedentary with limited ambulation.  Completed PT 04/2021 as she plateaued in progress and recommended continued HEP. Requires 24-hour supervision and  assistance for all ADLs.  She has caregiver assistance 5 days weekly.  Denies any behavioral concerns.  Sleeps well.  Has been experiencing decreased appetite over the past few months.  Denies new stroke/TIA symptoms.  Compliant on Plavix and atorvastatin, denies side effects.  Blood pressure today 159/71.  Routinely monitors at home and typically stable.  She is unsure when she last saw PCP.   No further concerns at this time  Update 03/21/2022 Dr. Leonie Man: She returns for follow-up after last visit 2 months ago with [place] practitioner.  She is accompanied by her caregiver and also spoke to her daughter over the phone.  Patient seems to be doing all right.  She is cognitively stable.  She remains on Namenda 10 mg twice daily which is tolerating well without side effects.  She gets occasionally disoriented particularly with respect to time but can recognize family members and caregivers on most days.  There are no delusions, hallucinations, behavior agitation or unsafe behavior.  Has no nightmares and sleeps quite well.  She  has a caregiver during the day from 8 AM to 8 PM and at night family keeps a close watch remotely cameras.  She stays mostly in bed and does not get out the caregivers arrive in the morning.  Not had any recurrent stroke or TIA symptoms..She remains on Plavix which she is tolerating well without bleeding or bruising.  Blood pressures under good control.  He is tolerating Lipitor well without muscle aches complaints.  His sugars have been in the.  Mental status exam today she scored 24/30 which is stable compared to last visit.     Update 09/19/2022 JM: Patient returns for follow-up visit after prior visit with Dr. Leonie Man 6 months ago.  Cognition has been stable since prior visit.  Remains on Namenda 10 mg twice daily, tolerating well.  No significant behavioral concerns.  Continues to have caregiver assistance during the day and family monitors her at night via cameras.  Denies any  new or reoccurring stroke/TIA symptoms.  Remains on Plavix and atorvastatin.  Blood pressure ***.  Routinely follows with PCP ***.        ROS:   14 system review of systems is positive for those listed in HPI and all other systems negative     PMH:  Past Medical History:  Diagnosis Date   Diabetes mellitus without complication ([place])    Essential hypertension    High cholesterol    Hypothyroidism    Stroke ([place])    TIA (transient ischemic attack) 2018    Social History:  Social History   Socioeconomic History   Marital status: Married    Spouse name: John   Number of children: Not on file   Years of education: Not on file   Highest education level: Not on file  Occupational History   Not on file  Tobacco Use   Smoking status: Never   Smokeless tobacco: Never  Vaping Use   Vaping Use: Never used  Substance and Sexual Activity   Alcohol use: No   Drug use: No   Sexual activity: Not on file  Other Topics Concern   Not on file  Social History Narrative   Lives with spouse   Right Handed   Drinks 1 cup caffeine/daily   Social Determinants of Health   Financial Resource Strain: Not on file  Food Insecurity: Not on file  Transportation Needs: Not on file  Physical Activity: Not on file  Stress: Not on file  Social Connections: Not on file  Intimate Partner Violence: Not on file    Medications:   Current Outpatient Medications on File Prior to Visit  Medication Sig Dispense Refill   acetaminophen (TYLENOL) 325 MG tablet Take 2 tablets (650 mg total) by mouth every 4 (four) hours as needed for mild pain (or temp > 37.5 C (99.5 F)).     amLODipine (NORVASC) 10 MG tablet Take 1 tablet (10 mg total) by mouth daily. 30 tablet 0   atorvastatin (LIPITOR) 80 MG tablet Take 1 tablet (80 mg total) by mouth daily. 90 tablet 0   Cholecalciferol (VITAMIN D3 PO) Take 1 tablet by mouth daily with lunch.      clopidogrel (PLAVIX) 75 MG tablet Take 1 tablet (75 mg total) by  mouth daily. 30 tablet 0   diclofenac Sodium (VOLTAREN) 1 % GEL APPLY 2 GRAMS TO AFFECTED AREA 4 TIMES A DAY 300 g 1   escitalopram (LEXAPRO) 5 MG tablet Take 5 mg by mouth every morning.  hydrALAZINE (APRESOLINE) 10 MG tablet Take 1 tablet (10 mg total) by mouth at bedtime. 30 tablet 0   levothyroxine (SYNTHROID) 100 MCG tablet Take 100 mcg by mouth daily.     levothyroxine (SYNTHROID) 88 MCG tablet Take 1 tablet (88 mcg total) by mouth daily before breakfast. 30 tablet 0   memantine (NAMENDA) 10 MG tablet Take 1 tablet (10 mg total) by mouth 2 (two) times daily. 180 tablet 3   metFORMIN (GLUCOPHAGE) 500 MG tablet Take 1 tablet (500 mg total) by mouth 2 (two) times daily with a meal. 60 tablet 0   ONETOUCH VERIO test strip 1 each daily.     telmisartan (MICARDIS) 80 MG tablet Take 80 mg by mouth daily.     telmisartan-hydrochlorothiazide (MICARDIS HCT) 80-12.5 MG tablet Take 1 tablet by mouth daily.     No current facility-administered medications on file prior to visit.    Allergies:   Allergies  Allergen Reactions   Ammonia Shortness Of Breath, Itching, Swelling and Other (See Comments)    Confusion, Headache     Peroxide [Hydrogen Peroxide] Shortness Of Breath, Itching, Swelling and Other (See Comments)    Confusion, Headache      Physical Exam There were no vitals filed for this visit.  There is no height or weight on file to calculate BMI.  General: well developed, well nourished elderly pleasant [place] lady, seated, in no evident distress Head: head normocephalic and atraumatic.  Neck: supple with no carotid or supraclavicular bruits Cardiovascular: regular rate and rhythm, no murmurs Musculoskeletal: no deformity Skin:  no rash/petichiae Vascular:  Normal pulses all extremities  Neurologic Exam Mental Status: Awake and fully alert. Oriented to place and time. Recent and remote memory intact. Attention span, concentration and fund of knowledge.  Appropriate  during visit. Mood and affect appropriate.      03/21/2022    2:25 PM 01/10/2022    1:01 PM 10/05/2020    2:19 PM  MMSE - Mini Mental State Exam  Orientation to time '4 4 4  '$ Orientation to Place '5 5 5  '$ Registration '3 3 3  '$ Attention/ Calculation '2 1 3  '$ Recall '2 2 1  '$ Language- name 2 objects '2 2 2  '$ Language- repeat 0 1 1  Language- follow 3 step command '3 3 3  '$ Language- read & follow direction 1 1 0  Write a sentence '1 1 1  '$ Copy design 1 0 0  Total score '24 23 23   '$ Cranial Nerves: Pupils equal, briskly reactive to light. Extraocular movements full without nystagmus. Visual fields full to confrontation. Hearing intact. Facial sensation intact. Face, tongue, palate moves normally and symmetrically.  Motor: Normal bulk and tone. Normal strength in all tested extremity muscles except mild left hemiparesis 4+/5 strength with weakness of left grip and intrinsic hand muscles.  Orbits right over left upper extremity.  Diminished fine finger movements and foot tapping on the left.. Sensory.: intact to touch ,pinprick .position and vibratory sensation.  Coordination: Mildly impaired finger-to-nose and needle coordination on the left. Gait and Station: Deferred  Reflexes: 2+ and asymmetric and brisker on the left. Toes downgoing.     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   -Continue Plavix and atorvastatin for secondary stroke prevention - advised ongoing refills will need to be obtained by PCP as both medications will be lifelong   -Ensure close PCP follow-up for aggressive stroke risk factor management, maintain aggressive risk factor modification strict control of hypertension with blood pressure goal below 130/90, lipids with LDL cholesterol goal below 70 mg percent.       Follow-up in 1 year or call earlier if needed -if remains stable at that time, she can return back to PCP for management/monitoring of cognition  and can follow up with [place] as needed      CC:  Zanard, Bernadene Bell, MD (Inactive)   I spent 31 minutes of face-to-face and non-face-to-face time with patient and caregiver.  This included previsit chart review, lab review, study review, order entry, electronic health record documentation, patient and caregiver education and discussion regarding cognitive concerns with review of MMSE, history of prior stroke with residual deficits aggressive stroke risk factor management and answered all the questions to patient and caregiver satisfaction  Frann Rider, AGNP-BC  [facility] [address] [place], [place] [address]  Phone [phone] Fax [phone] Note: This document was prepared with digital dictation and possible smart phrase technology. Any transcriptional errors that result from this process are unintentional.

## 2022-09-19 ENCOUNTER — Encounter: Payer: Self-pay | Admitting: Adult Health

## 2022-09-19 ENCOUNTER — Ambulatory Visit: Payer: Medicare Other | Admitting: Adult Health

## 2022-09-19 VITALS — BP 135/59 | HR 69 | Ht 62.0 in

## 2022-09-19 DIAGNOSIS — F0280 Dementia in other diseases classified elsewhere without behavioral disturbance: Secondary | ICD-10-CM

## 2022-09-19 DIAGNOSIS — F0150 Vascular dementia without behavioral disturbance: Secondary | ICD-10-CM | POA: Diagnosis not present

## 2022-09-19 DIAGNOSIS — G309 Alzheimer's disease, unspecified: Secondary | ICD-10-CM

## 2022-09-19 DIAGNOSIS — Z8673 Personal history of transient ischemic attack (TIA), and cerebral infarction without residual deficits: Secondary | ICD-10-CM

## 2022-09-19 MED ORDER — MEMANTINE HCL 10 MG PO TABS: 10.0000 mg | ORAL_TABLET | Freq: Two times a day (BID) | ORAL | 3 refills | Status: DC

## 2022-09-19 NOTE — Patient Instructions (Addendum)
Continue Namenda 10 mg twice daily  Continue clopidogrel 75 mg daily  and atorvastatin for secondary stroke prevention  Continue to follow up with PCP regarding blood pressure and cholesterol management  Maintain strict control of hypertension with blood pressure goal below 130/90 and cholesterol with LDL cholesterol (bad cholesterol) goal below 70 mg/dL.   Signs of a Stroke? Follow the BEFAST method:  Balance Watch for a sudden loss of balance, trouble with coordination or vertigo Eyes Is there a sudden loss of vision in one or both eyes? Or double vision?  Face: Ask the person to smile. Does one side of the face droop or is it numb?  Arms: Ask the person to raise both arms. Does one arm drift downward? Is there weakness or numbness of a leg? Speech: Ask the person to repeat a simple phrase. Does the speech sound slurred/strange? Is the person confused ? Time: If you observe any of these signs, call 911.    Follow up in 1 year or call earlier if needed    Thank you for coming to see [place] at [facility]. I hope we have been able to provide you high quality care today.  You may receive a patient satisfaction survey over the next few weeks. We would appreciate your feedback and comments so that we may continue to improve ourselves and the health of our patients.

## 2022-10-20 ENCOUNTER — Encounter: Payer: Self-pay | Admitting: Neurology

## 2022-10-23 ENCOUNTER — Other Ambulatory Visit: Payer: Self-pay

## 2022-10-23 MED ORDER — CLOPIDOGREL BISULFATE 75 MG PO TABS: 75.0000 mg | ORAL_TABLET | Freq: Every day | ORAL | 0 refills | Status: AC

## 2023-01-11 ENCOUNTER — Ambulatory Visit: Payer: Medicare Other | Admitting: Adult Health

## 2023-01-11 ENCOUNTER — Ambulatory Visit: Payer: Medicare Other | Admitting: Neurology

## 2023-09-19 ENCOUNTER — Ambulatory Visit: Payer: Medicare Other | Admitting: Adult Health

## 2023-09-24 ENCOUNTER — Encounter: Payer: Self-pay | Admitting: Adult Health

## 2023-09-24 ENCOUNTER — Ambulatory Visit: Admitting: Adult Health

## 2023-09-24 VITALS — BP 137/56 | HR 60 | Ht 62.0 in

## 2023-09-24 DIAGNOSIS — G309 Alzheimer's disease, unspecified: Secondary | ICD-10-CM | POA: Diagnosis not present

## 2023-09-24 DIAGNOSIS — Z8673 Personal history of transient ischemic attack (TIA), and cerebral infarction without residual deficits: Secondary | ICD-10-CM

## 2023-09-24 DIAGNOSIS — F0280 Dementia in other diseases classified elsewhere without behavioral disturbance: Secondary | ICD-10-CM

## 2023-09-24 DIAGNOSIS — F0150 Vascular dementia without behavioral disturbance: Secondary | ICD-10-CM

## 2023-09-24 MED ORDER — MEMANTINE HCL 10 MG PO TABS: 10.0000 mg | ORAL_TABLET | Freq: Two times a day (BID) | ORAL | 3 refills | Status: DC

## 2023-09-24 NOTE — Patient Instructions (Signed)
 Your Plan:  Continue Namenda 10 mg twice daily  Continue close PCP follow-up for aggressive stroke risk factor management     Follow-up in 1 year or call earlier if needed     Thank you for coming to see [place] at [facility]. I hope we have been able to provide you high quality care today.  You may receive a patient satisfaction survey over the next few weeks. We would appreciate your feedback and comments so that we may continue to improve ourselves and the health of our patients.

## 2023-09-24 NOTE — Progress Notes (Signed)
 [facility] [address] [place]. [place] [address] [phone]       OFFICE FOLLOW-UP NOTE  Ms. Angeline Slim Date of Birth:  [birth year] Medical Record Number:  [id number]    Primary neurologist: Dr. Pearlean Brownie Reason for visit: Mixed dementia, hx of stroke   Chief Complaint  Patient presents with   Cerebrovascular Accident    Rm 3 with daughter Benay Pike Pt is well and stable, daughters no new TIA/memory concerns.       HPI:   Update 09/24/2023 JM: Patient returns for yearly follow-up accompanied by her daughter, Benay Pike.  Overall stable from neurological standpoint without any new stroke/TIA symptoms and reports cognition has been stable.  Remains on Namenda 10 mg twice daily without side effects.  Occasional issues with more short-term memory difficulties such as forgetting that she took a shower 2 days prior or mixing up months of family members birthdays.  Continues to have aide assistance who helps with ADLs.  Transfers via wheelchair, able to stand/pivot for transfers, no recent falls.  Reports she sleeps well and appetite satisfactory.  Received hearing aids back in the spring and daughter feels this has helped some with her memory.  No behavioral concerns.  No new stroke/TIA symptoms.  Compliant on Plavix and atorvastatin.  Routinely follows with PCP for stroke risk factor management.    History provided for reference purposes only Update 09/19/2022 JM: Patient returns for follow-up visit after prior visit with Dr. Pearlean Brownie 6 months ago.  She is accompanied by her caregiver [name]. Cognition has been stable since prior visit.  Remains on Namenda 10 mg twice daily, tolerating well.  No significant behavioral concerns.  Sleeping well at night and appetite good. Continues to have caregiver assistance during the day and family monitors her at night via cameras.  Denies any new or reoccurring stroke/TIA symptoms.  Remains on Plavix and atorvastatin.  Blood pressure  well-controlled.  No questions or concerns at this time.  Update 03/21/2022 Dr. Pearlean Brownie: She returns for follow-up after last visit 2 months ago with Shanda Bumps nurse practitioner.  She is accompanied by her caregiver and also spoke to her daughter over the phone.  Patient seems to be doing all right.  She is cognitively stable.  She remains on Namenda 10 mg twice daily which is tolerating well without side effects.  She gets occasionally disoriented particularly with respect to time but can recognize family members and caregivers on most days.  There are no delusions, hallucinations, behavior agitation or unsafe behavior.  Has no nightmares and sleeps quite well.  She has a caregiver during the day from 8 AM to 8 PM and at night family keeps a close watch remotely cameras.  She stays mostly in bed and does not get out the caregivers arrive in the morning.  Not had any recurrent stroke or TIA symptoms..She remains on Plavix which she is tolerating well without bleeding or bruising.  Blood pressures under good control.  He is tolerating Lipitor well without muscle aches complaints.  His sugars have been in the.  Mental status exam today she scored 24/30 which is stable compared to last visit.   Update 01/09/2022 JM: Patient returns for follow-up after prior visit over 1 year ago with Dr. Pearlean Brownie (although 6 mo f/u visit requested). Accompanied by caregiver. Overall stable.  Reports cognition has been stable since prior visit.  Did work with SLP a few months ago with caregiver noting some improvement of cognition.  MMSE today 23/30 (prior  23/30). remains on Namenda 10 mg twice daily, denies side effects.  Remains sedentary with limited ambulation.  Completed PT 04/2021 as she plateaued in progress and recommended continued HEP. Requires 24-hour supervision and assistance for all ADLs.  She has caregiver assistance 5 days weekly.  Denies any behavioral concerns.  Sleeps well.  Has been experiencing decreased appetite over  the past few months.  Denies new stroke/TIA symptoms.  Compliant on Plavix and atorvastatin, denies side effects.  Blood pressure today 159/71.  Routinely monitors at home and typically stable.  She is unsure when she last saw PCP.   No further concerns at this time  Update 10/05/2020 Dr. Pearlean Brownie: She returns for follow-up after last visit 4 months ago.  She is accompanied by her son.  She has tolerated Namenda well without any dizziness, sleepiness or other side effects.  She has noticed improvement in her communication and speaking as well as short-term memory.  She still has trouble with multitasking and gets easy distractibility.  The daughter had some concerns as to whether she was depressed and had called requesting referral to neuropsychologist but I was unable to speak to her when I called and left message.  She still continues to spend most of the time in a wheelchair and she has intense fear of walking and falling and hence does not walk even with a walker.  She is mostly calm and composed and does not have any agitation, delusions or hallucinations.  She requires 24-hour care.  She remains on Plavix which is tolerating well without bruising or bleeding.  Blood pressure is usually well controlled today it is elevated in office at 183/84.  Sugar and cholesterol have been under good control.  No new complaints today.  Mini-Mental status exam today is 23/30 which is slightly improved from 21/30 in September 21   Update 05/04/2020 Dr. Pearlean Brownie: She returns for follow-up after last visit a month ago.  She is accompanied by her son and husband.  They feel there has been slight regression in her memory and cognitive issues.  She keeps on asking the same question over and over again.  She has poor appetite and has low energy level.  She has regressed with her walking and refuses to walk and has extreme fear of falling.  She is getting some home physical and occupational therapy and gets up only with assistance.   She did undergo lab work at last visit and B12, RPR and homocystine were normal.  TSH was slightly elevated and she has seen her primary care physician since who has adjusted her thyroid medications.  EEG showed mild generalized slowing with 6 to 7 Hz theta activity throughout.  No definite epileptiform activity was noted.  She is still has some dragging of the right foot for walking and is scared that she will fall.  She does have a walker but does not use it a lot.  She is tolerating Plavix well without bruising or bleeding.  Blood pressures well controlled today it is 137/72.  Last hemoglobin A1c was 7.3 and fasting sugars are usually in the 150s.  She has no family history of dementia.  She has never tried Aricept or Namenda like medications but is willing to try them now.  She was seen in the ER briefly a few weeks ago for transient confusion which resolved and she left without any significant work-up being done.  Initial visit 04/08/2020 Dr. Pearlean Brownie: Ms. Russett is a 83 year old [place] origin lady  who is seen for first office follow-up visit following hospital consultation for stroke.  She is accompanied by her daughter.  History is obtained from them, review of electronic medical records and I personally reviewed pertinent available imaging films in PACS.  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  She presented on 01/05/[facility] to [facility] with left-sided weakness which started a week prior to presentation.  She was not able to tell initially whether this was due to her arthritis or a stroke.  She continued to have left-sided problems and getting in and out of bed in chair and also had some slurred speech eventually prompting hospital visit.  CT scan of the head showed no acute abnormality but old left basal ganglia lacunar infarct.  MRI scan confirmed acute right corona radiata and posterior right temporal  and occipital white matter infarcts.  There is also old bilateral corona radiata, left thalamus and left paramedian pontine lacunar infarcts.  MRI of the brain showed multifocal intracranial atherosclerotic changes and MRA of the neck showed 25% right ICA and left vertebral artery atherosclerotic multifocal narrowing.  2D echo showed normal ejection fraction without cardiac source of embolism.  LDL cholesterol of 196 mg percent and hemoglobin A1c was 7.6.  Patient was started on dual antiplatelet therapy aspirin Plavix for 3 months and was transferred to inpatient rehab.  She is finished rehab stay is currently living at home.  She is doing outpatient physical occupational therapy.  She is able to walk with a walker.  She is shown some progressive improvement and can get in and out of her couch in bed by herself.  She still has mild residual left-sided weakness but it is improving.  The family feels that the patient is depressed she gets frustrated easily and she feels she is very poor stamina.  He saw primary care physician last week and she was prescribed Lexapro but she has not yet started it.  She is living at home with her husband.  They have a CNA who comes in for few hours every day.  Patient's husband is also having cognitive issues and daughter is concerned about the long-term care.  Patient also notices increased forgetfulness and short-term memory difficulties.  She has not yet had any lab work for evaluation for reversible causes.         ROS:   14 system review of systems is positive for those listed in HPI and all other systems negative     PMH:  Past Medical History:  Diagnosis Date   Diabetes mellitus without complication (HCC)    Essential hypertension    High cholesterol    Hypothyroidism    Stroke (HCC)    TIA (transient ischemic attack) 2018    Social History:  Social History   Socioeconomic History   Marital status: Married    Spouse name: John   Number of  children: Not on file   Years of education: Not on file   Highest education level: Not on file  Occupational History   Not on file  Tobacco Use   Smoking status: Never   Smokeless tobacco: Never  Vaping Use   Vaping status: Never Used  Substance and Sexual Activity   Alcohol use: No   Drug use: No   Sexual activity: Not on file  Other Topics Concern   Not on file  Social History Narrative   Lives with spouse  Right Handed   Drinks 1 cup caffeine/daily   Social Drivers of Health   Financial Resource Strain: Not on file  Food Insecurity: No Food Insecurity (04/25/2022)   [facility]   Hunger Vital Sign    Worried About Running Out of Food in the Last Year: Never true    Ran Out of Food in the Last Year: Never true  Transportation Needs: No Transportation Needs (04/25/2022)   Received from [facility], [facility]   PRAPARE - Transportation    Lack of Transportation (Medical): No    Lack of Transportation (Non-Medical): No  Physical Activity: Not on file  Stress: Not on file  Social Connections: Not on file  Intimate Partner Violence: Not on file    Medications:   Current Outpatient Medications on File Prior to Visit  Medication Sig Dispense Refill   clopidogrel (PLAVIX) 75 MG tablet Take 1 tablet (75 mg total) by mouth daily. 30 tablet 0   escitalopram (LEXAPRO) 5 MG tablet Take 10 mg by mouth every morning.     hydrALAZINE (APRESOLINE) 10 MG tablet Take 1 tablet (10 mg total) by mouth at bedtime. 30 tablet 0   levothyroxine (SYNTHROID) 100 MCG tablet Take 100 mcg by mouth daily.     ONETOUCH VERIO test strip 1 each daily.     telmisartan (MICARDIS) 80 MG tablet Take 80 mg by mouth daily.     acetaminophen (TYLENOL) 325 MG tablet Take 2 tablets (650 mg total) by mouth every 4 (four) hours as needed for mild pain (or temp > 37.5 C (99.5 F)). (Patient not taking: Reported on 09/24/2023)     amLODipine (NORVASC) 10 MG tablet Take 1  tablet (10 mg total) by mouth daily. (Patient not taking: Reported on 09/24/2023) 30 tablet 0   atorvastatin (LIPITOR) 80 MG tablet Take 1 tablet (80 mg total) by mouth daily. 90 tablet 0   Cholecalciferol (VITAMIN D3 PO) Take 1 tablet by mouth daily with lunch.  (Patient not taking: Reported on 09/24/2023)     diclofenac Sodium (VOLTAREN) 1 % GEL APPLY 2 GRAMS TO AFFECTED AREA 4 TIMES A DAY (Patient not taking: Reported on 09/24/2023) 300 g 1   No current facility-administered medications on file prior to visit.    Allergies:   Allergies  Allergen Reactions   Ammonia Shortness Of Breath, Itching, Swelling and Other (See Comments)    Confusion, Headache     Peroxide [Hydrogen Peroxide] Shortness Of Breath, Itching, Swelling and Other (See Comments)    Confusion, Headache        Physical Exam Today's Vitals   09/24/23 1252  BP: (!) 137/56  Pulse: 60  Height: 5\' 2"  (1.575 m)   Body mass index is 27.44 kg/m.  General: well developed, well nourished elderly pleasant [place] lady, seated, in no evident distress  Neurologic Exam Mental Status: Awake and fully alert. Attention span,concentration and fund of knowledge appropriate during visit.  Mood and affect appropriate.      09/24/2023    1:10 PM 09/19/2022    2:11 PM 03/21/2022    2:25 PM  MMSE - Mini Mental State Exam  Orientation to time 5 3 4   Orientation to Place 5 4 5   Registration 3 3 3   Attention/ Calculation 1 1 2   Recall 2 2 2   Language- name 2 objects 2 2 2   Language- repeat 1 1 0  Language- follow 3 step command 3 3 3   Language- read &  follow direction 1 1 1   Write a sentence 1 1 1   Copy design 0 0 1  Total score 24 21 24    Cranial Nerves: Pupils equal, briskly reactive to light. Extraocular movements full without nystagmus. Visual fields full to confrontation. Hearing intact. Facial sensation intact. Face, tongue, palate moves normally and symmetrically.  Motor: Normal bulk and tone. Normal strength in all  tested extremity muscles except mild left hemiparesis 4+/5 strength with weakness of left grip and intrinsic hand muscles.  Orbits right over left upper extremity.  Diminished fine finger movements and foot tapping on the left.. Sensory.: intact to touch ,pinprick .position and vibratory sensation.  Coordination: Mildly impaired finger-to-nose and needle coordination on the left. Gait and Station: Deferred  Reflexes: 2+ and asymmetric and brisker on the left. Toes downgoing.       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   -Continue Plavix and atorvastatin 80 mg daily for secondary stroke prevention managed/prescribed by PCP -Ensure close PCP follow-up for aggressive stroke risk factor management, maintain aggressive risk factor modification strict control of hypertension with blood pressure goal below 130/90, lipids with LDL cholesterol goal below 70 mg percent.   -Lipid panel 02/2023 LDL 106, has f/u with PCP end of this month, request repeat lipid panel and if LDL remains above goal of 70, consider adding Zetia in addition to atorvastatin 80 mg daily or switch to Crestor 40 mg daily but will defer to PCP     Follow-up in 1 year or call earlier if needed     CC:  Zanard, Hinton Dyer, MD   I spent 25 minutes of face-to-face and non-face-to-face time with patient and daughter.  This included previsit chart review, lab review, study review, order entry, electronic health record  documentation, patient and daughter education and discussion regarding the above and answered all the questions to patient and daughter satisfaction  Ihor Austin, [facility]  [facility] [address] [place], [place] [address]  Phone [phone] Fax [phone] Note: This document was prepared with digital dictation and possible smart phrase technology. Any transcriptional errors that result from this process are unintentional.

## 2023-10-14 ENCOUNTER — Other Ambulatory Visit: Payer: Self-pay

## 2023-10-14 ENCOUNTER — Emergency Department (HOSPITAL_COMMUNITY)

## 2023-10-14 ENCOUNTER — Observation Stay (HOSPITAL_COMMUNITY)
Admission: EM | Admit: 2023-10-14 | Discharge: 2023-10-16 | Disposition: A | Discharge status: Home / Self Care | Attending: Internal Medicine | Admitting: Internal Medicine

## 2023-10-14 DIAGNOSIS — Z7902 Long term (current) use of antithrombotics/antiplatelets: Secondary | ICD-10-CM | POA: Insufficient documentation

## 2023-10-14 DIAGNOSIS — Z7901 Long term (current) use of anticoagulants: Secondary | ICD-10-CM | POA: Diagnosis not present

## 2023-10-14 DIAGNOSIS — E039 Hypothyroidism, unspecified: Secondary | ICD-10-CM | POA: Diagnosis not present

## 2023-10-14 DIAGNOSIS — G459 Transient cerebral ischemic attack, unspecified: Secondary | ICD-10-CM | POA: Diagnosis not present

## 2023-10-14 DIAGNOSIS — E785 Hyperlipidemia, unspecified: Secondary | ICD-10-CM

## 2023-10-14 DIAGNOSIS — N390 Urinary tract infection, site not specified: Secondary | ICD-10-CM

## 2023-10-14 DIAGNOSIS — F0390 Unspecified dementia without behavioral disturbance: Secondary | ICD-10-CM | POA: Diagnosis not present

## 2023-10-14 DIAGNOSIS — R531 Weakness: Secondary | ICD-10-CM

## 2023-10-14 DIAGNOSIS — I69354 Hemiplegia and hemiparesis following cerebral infarction affecting left non-dominant side: Secondary | ICD-10-CM | POA: Insufficient documentation

## 2023-10-14 DIAGNOSIS — Z79899 Other long term (current) drug therapy: Secondary | ICD-10-CM | POA: Insufficient documentation

## 2023-10-14 DIAGNOSIS — I350 Nonrheumatic aortic (valve) stenosis: Secondary | ICD-10-CM | POA: Insufficient documentation

## 2023-10-14 DIAGNOSIS — I080 Rheumatic disorders of both mitral and aortic valves: Secondary | ICD-10-CM | POA: Insufficient documentation

## 2023-10-14 DIAGNOSIS — D500 Iron deficiency anemia secondary to blood loss (chronic): Secondary | ICD-10-CM | POA: Diagnosis not present

## 2023-10-14 DIAGNOSIS — R8271 Bacteriuria: Secondary | ICD-10-CM

## 2023-10-14 DIAGNOSIS — E871 Hypo-osmolality and hyponatremia: Secondary | ICD-10-CM | POA: Diagnosis present

## 2023-10-14 DIAGNOSIS — L8990 Pressure ulcer of unspecified site, unspecified stage: Secondary | ICD-10-CM | POA: Diagnosis not present

## 2023-10-14 DIAGNOSIS — N3090 Cystitis, unspecified without hematuria: Secondary | ICD-10-CM | POA: Insufficient documentation

## 2023-10-14 DIAGNOSIS — Z8249 Family history of ischemic heart disease and other diseases of the circulatory system: Secondary | ICD-10-CM | POA: Insufficient documentation

## 2023-10-14 DIAGNOSIS — E119 Type 2 diabetes mellitus without complications: Secondary | ICD-10-CM | POA: Diagnosis not present

## 2023-10-14 DIAGNOSIS — D649 Anemia, unspecified: Secondary | ICD-10-CM

## 2023-10-14 LAB — URINALYSIS, W/ REFLEX TO CULTURE (INFECTION SUSPECTED)
Bilirubin Urine: NEGATIVE
Glucose, UA: NEGATIVE mg/dL
Hgb urine dipstick: NEGATIVE
Ketones, ur: NEGATIVE mg/dL
Nitrite: NEGATIVE
Protein, ur: NEGATIVE mg/dL
Specific Gravity, Urine: 1.025 (ref 1.005–1.030)
WBC, UA: >50 WBC/hpf (ref 0–5)
pH: 6 (ref 5.0–8.0)

## 2023-10-14 LAB — CBC
HCT: 31.9 % — ABNORMAL LOW (ref 36.0–46.0)
Hemoglobin: 10.4 g/dL — ABNORMAL LOW (ref 12.0–15.0)
MCH: 27.7 pg (ref 26.0–34.0)
MCHC: 32.6 g/dL (ref 30.0–36.0)
MCV: 85.1 fL (ref 80.0–100.0)
Platelets: 189 10*3/uL (ref 150–400)
RBC: 3.75 MIL/uL — ABNORMAL LOW (ref 3.87–5.11)
RDW: 15.1 % (ref 11.5–15.5)
WBC: 7.9 10*3/uL (ref 4.0–10.5)
nRBC: 0 % (ref 0.0–0.2)

## 2023-10-14 LAB — I-STAT CHEM 8, ED
BUN: 42 mg/dL — ABNORMAL HIGH (ref 8–23)
Calcium, Ion: 1.11 mmol/L — ABNORMAL LOW (ref 1.15–1.40)
Chloride: 102 mmol/L (ref 98–111)
Creatinine, Ser: 1.1 mg/dL — ABNORMAL HIGH (ref 0.44–1.00)
Glucose, Bld: 111 mg/dL — ABNORMAL HIGH (ref 70–99)
HCT: 34 % — ABNORMAL LOW (ref 36.0–46.0)
Hemoglobin: 11.6 g/dL — ABNORMAL LOW (ref 12.0–15.0)
Potassium: 4.5 mmol/L (ref 3.5–5.1)
Sodium: 135 mmol/L (ref 135–145)
TCO2: 25 mmol/L (ref 22–32)

## 2023-10-14 LAB — COMPREHENSIVE METABOLIC PANEL WITH GFR
ALT: 16 U/L (ref 0–44)
AST: 23 U/L (ref 15–41)
Albumin: 3.2 g/dL — ABNORMAL LOW (ref 3.5–5.0)
Alkaline Phosphatase: 48 U/L (ref 38–126)
Anion gap: 9 (ref 5–15)
BUN: 40 mg/dL — ABNORMAL HIGH (ref 8–23)
CO2: 25 mmol/L (ref 22–32)
Calcium: 8.7 mg/dL — ABNORMAL LOW (ref 8.9–10.3)
Chloride: 100 mmol/L (ref 98–111)
Creatinine, Ser: 1.01 mg/dL — ABNORMAL HIGH (ref 0.44–1.00)
GFR, Estimated: 56 mL/min — ABNORMAL LOW (ref >60)
Glucose, Bld: 116 mg/dL — ABNORMAL HIGH (ref 70–99)
Potassium: 4.6 mmol/L (ref 3.5–5.1)
Sodium: 134 mmol/L — ABNORMAL LOW (ref 135–145)
Total Bilirubin: 0.5 mg/dL (ref 0.0–1.2)
Total Protein: 6.5 g/dL (ref 6.5–8.1)

## 2023-10-14 LAB — PROTIME-INR
INR: 1.1 (ref 0.8–1.2)
Prothrombin Time: 13.9 s (ref 11.4–15.2)

## 2023-10-14 LAB — DIFFERENTIAL
Abs Immature Granulocytes: 0.05 10*3/uL (ref 0.00–0.07)
Basophils Absolute: 0 10*3/uL (ref 0.0–0.1)
Basophils Relative: 1 %
Eosinophils Absolute: 0.3 10*3/uL (ref 0.0–0.5)
Eosinophils Relative: 3 %
Immature Granulocytes: 1 %
Lymphocytes Relative: 20 %
Lymphs Abs: 1.6 10*3/uL (ref 0.7–4.0)
Monocytes Absolute: 0.8 10*3/uL (ref 0.1–1.0)
Monocytes Relative: 10 %
Neutro Abs: 5.2 10*3/uL (ref 1.7–7.7)
Neutrophils Relative %: 65 %

## 2023-10-14 LAB — APTT: aPTT: 27 s (ref 24–36)

## 2023-10-14 LAB — ETHANOL: Alcohol, Ethyl (B): <10 mg/dL (ref <10)

## 2023-10-14 LAB — CBG MONITORING, ED: Glucose-Capillary: 123 mg/dL — ABNORMAL HIGH (ref 70–99)

## 2023-10-14 MED ORDER — STROKE: EARLY STAGES OF RECOVERY BOOK
Freq: Once | Status: AC
  Filled 2023-10-14: qty 1

## 2023-10-14 MED ORDER — ASPIRIN 81 MG PO TBEC
81.0000 mg | DELAYED_RELEASE_TABLET | Freq: Every day | ORAL | Status: DC
  Administered 2023-10-14 – 2023-10-15 (×2): 81 mg via ORAL
  Filled 2023-10-14 (×2): qty 1

## 2023-10-14 MED ORDER — ATORVASTATIN CALCIUM 80 MG PO TABS
80.0000 mg | ORAL_TABLET | Freq: Every day | ORAL | Status: DC
  Administered 2023-10-14 – 2023-10-15 (×2): 80 mg via ORAL
  Filled 2023-10-14: qty 2
  Filled 2023-10-14: qty 1

## 2023-10-14 MED ORDER — CLOPIDOGREL BISULFATE 75 MG PO TABS
75.0000 mg | ORAL_TABLET | Freq: Every day | ORAL | Status: DC
  Administered 2023-10-15 – 2023-10-16 (×2): 75 mg via ORAL
  Filled 2023-10-14 (×2): qty 1

## 2023-10-14 MED ORDER — IOHEXOL 350 MG/ML SOLN
75.0000 mL | Freq: Once | INTRAVENOUS | Status: AC | PRN
  Administered 2023-10-14: 75 mL via INTRAVENOUS

## 2023-10-14 MED ORDER — SODIUM CHLORIDE 0.9% FLUSH: 3.0000 mL | Freq: Once | INTRAVENOUS | Status: DC

## 2023-10-14 MED ORDER — SODIUM CHLORIDE 0.9 % IV SOLN
1.0000 g | Freq: Once | INTRAVENOUS | Status: AC
  Administered 2023-10-14: 1 g via INTRAVENOUS
  Filled 2023-10-14: qty 10

## 2023-10-14 MED ORDER — SODIUM CHLORIDE 0.9 % IV SOLN
Freq: Once | INTRAVENOUS | Status: AC
Start: 2023-10-14 — End: 2023-10-14

## 2023-10-14 NOTE — ED Provider Notes (Signed)
 [place] EMERGENCY DEPARTMENT AT [facility] Provider Note   CSN: [id number] Arrival date & time: 10/14/23  4098     History {Add pertinent medical, surgical, social history, OB history to HPI:1} No chief complaint on file.   Jill Rose is a 83 y.o. adult.  HPI     Home Medications Prior to Admission medications   Medication Sig Start Date End Date Taking? Authorizing Provider  acetaminophen (TYLENOL) 325 MG tablet Take 2 tablets (650 mg total) by mouth every 4 (four) hours as needed for mild pain (or temp > 37.5 C (99.5 F)). 01/28/20   Angiulli, Mcarthur Rossetti, PA-C  amLODipine (NORVASC) 5 MG tablet Take 5 mg by mouth daily.    [provider]  atorvastatin (LIPITOR) 80 MG tablet Take 1 tablet (80 mg total) by mouth daily. 01/28/20 04/27/20  Angiulli, Mcarthur Rossetti, PA-C  Cholecalciferol (VITAMIN D3 PO) Take 1 tablet by mouth daily with lunch.    [provider]  clopidogrel (PLAVIX) 75 MG tablet Take 1 tablet (75 mg total) by mouth daily. 10/23/22   Micki Riley, MD  diclofenac Sodium (VOLTAREN) 1 % GEL APPLY 2 GRAMS TO AFFECTED AREA 4 TIMES A DAY 11/02/20   [name], MD  escitalopram (LEXAPRO) 10 MG tablet Take 10 mg by mouth every morning. 04/06/20   [provider]  hydrALAZINE (APRESOLINE) 10 MG tablet Take 1 tablet (10 mg total) by mouth at bedtime. 01/28/20   Angiulli, Mcarthur Rossetti, PA-C  levothyroxine (SYNTHROID) 100 MCG tablet Take 100 mcg by mouth daily. 10/04/20   [provider]  memantine (NAMENDA) 10 MG tablet Take 1 tablet (10 mg total) by mouth 2 (two) times daily. 09/24/23   Ihor Austin, NP  [facility] VERIO test strip 1 each daily. 05/24/20   [provider]  telmisartan (MICARDIS) 80 MG tablet Take 80 mg by mouth daily. 05/06/20   [provider]      Allergies    Ammonia and Peroxide [hydrogen peroxide]    Review of Systems   Review of Systems  Physical Exam Updated Vital Signs There were no  vitals taken for this visit. Physical Exam  ED Results / Procedures / Treatments   Labs (all labs ordered are listed, but only abnormal results are displayed) Labs Reviewed  CBG MONITORING, ED - Abnormal; Notable for the following components:      Result Value   Glucose-Capillary 123 (*)    All other components within normal limits  PROTIME-INR  APTT  CBC  DIFFERENTIAL  COMPREHENSIVE METABOLIC PANEL WITH GFR  ETHANOL  I-STAT CHEM 8, ED    EKG None  Radiology No results found.  Procedures Procedures  {Document cardiac monitor, telemetry assessment procedure when appropriate:1}  Medications Ordered in ED Medications  sodium chloride flush (NS) 0.9 % injection 3 mL (has no administration in time range)    ED Course/ Medical Decision Making/ A&P   {   Click here for ABCD2, HEART and other calculatorsREFRESH Note before signing :1}                              Medical Decision Making Amount and/or Complexity of Data Reviewed Labs: ordered. Radiology: ordered.   ***  {Document critical care time when appropriate:1} {Document review of labs and clinical decision tools ie heart score, Chads2Vasc2 etc:1}  {Document your independent review of radiology images, and any outside records:1} {Document your discussion  with family members, caretakers, and with consultants:1} {Document social determinants of health affecting pt's care:1} {Document your decision making why or why not admission, treatments were needed:1} Final Clinical Impression(s) / ED Diagnoses Final diagnoses:  None    Rx / DC Orders ED Discharge Orders     None

## 2023-10-14 NOTE — ED Triage Notes (Signed)
 Pt BIB [facility] from home as a code stroke. Pt's last seen normal was 12pm today by her caregiver. Pt had a stroke in the past w L side  deficits. Today her L side seems weaker along w slurred speech. VSS.

## 2023-10-14 NOTE — ED Notes (Signed)
 Patient transported to MRI

## 2023-10-14 NOTE — Consult Note (Signed)
 NEUROLOGY CONSULT NOTE   Date of service: October 14, 2023 Patient Name: Jill Rose MRN:  [id number] DOB:  1940/07/18 Chief Complaint: "Worse than baseline left-sided weakness" Requesting Provider: Wynetta Fines, Jill Rose  History of Present Illness  Jill Rose is a 83 y.o. 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  By the time of ED arrival her symptoms had markedly improved  LKW: Noon Modified rankin score: 4-Needs assistance to walk and tend to bodily needs IV Thrombolysis: No, out of the window EVT: No, too mild to treat   NIHSS components Score: Comment  1a Level of Conscious 0[x]  1[]  2[]  3[]      1b LOC Questions 0[]  1[]  2[x]     Reports month is April, age as 52  1c LOC Commands 0[x]  1[]  2[]       2 Best Gaze 0[x]  1[]  2[]       3 Visual 0[x]  1[]  2[]  3[]      4 Facial Palsy 0[]  1[x]  2[]  3[]    Mild right facial droop  5a Motor Arm - left 0[x]  1[]  2[]  3[]  4[]  UN[]    5b Motor Arm - Right 0[x]  1[]  2[]  3[]  4[]  UN[]    6a Motor Leg - Left 0[]  1[x]  2[]  3[]  4[]  UN[]    6b Motor Leg - Right 0[]  1[x]  2[]  3[]  4[]  UN[]    7 Limb Ataxia 0[x]  1[]  2[]  3[]  UN[]   Tested only in the upper extremities  8 Sensory 0[x]  1[]  2[]  UN[]      9 Best Language 0[]  1[x]  2[]  3[]      10 Dysarthria 0[x]  1[]  2[]  UN[]      11 Extinct. and Inattention 0[x]  1[]  2[]       TOTAL:       ROS  Limited by dementia  Past History   Past Medical History:  Diagnosis Date   Diabetes mellitus without complication (HCC)    Essential hypertension    High cholesterol    Hypothyroidism    Stroke [facility])    TIA (transient ischemic attack) 2018    No past surgical history on file.  Family  History: Family History  Problem Relation Age of Onset   Heart disease Mother     Social History  reports that she has never smoked. She has never used smokeless tobacco. She reports that she does not drink alcohol and does not use drugs.  Allergies  Allergen Reactions   Ammonia Shortness Of Breath, Itching, Swelling and Other (See Comments)    Confusion, Headache     Peroxide [Hydrogen Peroxide] Shortness Of Breath, Itching, Swelling and Other (See Comments)    Confusion, Headache      Medications   Current Facility-Administered Medications:    [START ON 10/15/2023]  stroke: early stages of recovery book, , Does not apply, Once, Jill Tessier Rose, Jill Rose   aspirin EC tablet 81 mg, 81 mg, Oral, Daily, Kyden Potash Rose, Jill Rose   atorvastatin (LIPITOR) tablet 80 mg, 80 mg, Oral, QHS, Mignonne Afonso Rose, Jill Rose   [START ON 10/15/2023] clopidogrel (PLAVIX) tablet 75 mg, 75 mg, Oral, Daily, Veria Stradley Rose, Jill Rose   sodium chloride flush (NS) 0.9 % injection 3 mL, 3 mL, Intravenous, Once, Wynetta Fines, Jill Rose  Current Outpatient Medications:  acetaminophen (TYLENOL) 325 MG tablet, Take 2 tablets (650 mg total) by mouth every 4 (four) hours as needed for mild pain (or temp > 37.5 C (99.5 F))., Disp: , Rfl:    amLODipine (NORVASC) 5 MG tablet, Take 5 mg by mouth daily., Disp: , Rfl:    atorvastatin (LIPITOR) 80 MG tablet, Take 1 tablet (80 mg total) by mouth daily., Disp: 90 tablet, Rfl: 0   Cholecalciferol (VITAMIN D3 PO), Take 1 tablet by mouth daily with lunch., Disp: , Rfl:    clopidogrel (PLAVIX) 75 MG tablet, Take 1 tablet (75 mg total) by mouth daily., Disp: 30 tablet, Rfl: 0   diclofenac Sodium (VOLTAREN) 1 % GEL, APPLY 2 GRAMS TO AFFECTED AREA 4 TIMES A DAY, Disp: 300 g, Rfl: 1   escitalopram (LEXAPRO) 10 MG tablet, Take 10 mg by mouth every morning., Disp: , Rfl:    hydrALAZINE (APRESOLINE) 10 MG tablet, Take 1 tablet (10 mg total) by mouth at bedtime., Disp: 30 tablet, Rfl: 0    levothyroxine (SYNTHROID) 100 MCG tablet, Take 100 mcg by mouth daily., Disp: , Rfl:    memantine (NAMENDA) 10 MG tablet, Take 1 tablet (10 mg total) by mouth 2 (two) times daily., Disp: 180 tablet, Rfl: 3   ONETOUCH VERIO test strip, 1 each daily., Disp: , Rfl:    telmisartan (MICARDIS) 80 MG tablet, Take 80 mg by mouth daily., Disp: , Rfl:   Vitals   Vitals:   11-06-2023 1842 06-Nov-2023 1915 2023/11/06 2015  BP:  (!) 145/50 (!) 139/53  Pulse:  87 86  Resp:  17 19  SpO2:  100% 99%  Weight: 59.4 kg      Body mass index is 23.95 kg/m.  Physical Exam   Constitutional: Appears comfortable Psych: Affect appropriate to situation.  Cardiovascular: Perfusing extremities well  Respiratory: Effort normal, non-labored breathing.  GI: Soft.  No distension. There is no tenderness.   Neurologic Examination   See NIH stroke scale above  Labs/Imaging/Neurodiagnostic studies   CBC:  Recent Labs  Lab November 06, 2023 1915 2023-11-06 1944  WBC 7.9  --   NEUTROABS 5.2  --   HGB 10.4* 11.6*  HCT 31.9* 34.0*  MCV 85.1  --   PLT 189  --    Basic Metabolic Panel:  Lab Results  Component Value Date   NA 135 Nov 06, 2023   K 4.5 11-06-2023   CO2 25 Nov 06, 2023   GLUCOSE 111 (H) November 06, 2023   BUN 42 (H) 11/06/2023   CREATININE 1.10 (H) 11-06-2023   CALCIUM 8.7 (Rose) November 06, 2023   GFRNONAA 56 (Rose) 11-06-2023   GFRAA >60 01/28/2020   Lipid Panel:  Lab Results  Component Value Date   LDLCALC 196 (H) 01/06/2020   HgbA1c:  Lab Results  Component Value Date   HGBA1C 7.6 (H) 01/06/2020   Urine Drug Screen:     Component Value Date/Time   LABOPIA NONE DETECTED 01/26/2017 1939   COCAINSCRNUR NONE DETECTED 01/26/2017 1939   LABBENZ NONE DETECTED 01/26/2017 1939   AMPHETMU NONE DETECTED 01/26/2017 1939   THCU NONE DETECTED 01/26/2017 1939   LABBARB NONE DETECTED 01/26/2017 1939    Alcohol Level     Component Value Date/Time   ETH <10 06-Nov-2023 1915   INR  Lab Results  Component Value Date    INR 1.1 11-06-23   APTT  Lab Results  Component Value Date   APTT 27 2023/11/06   AED levels: No results found for: "PHENYTOIN", "ZONISAMIDE", "LAMOTRIGINE", "LEVETIRACETA"  CT Head  without contrast(Personally reviewed): 1. No evidence of acute intracranial abnormality. ASPECTS of 10. 2. Severe chronic small vessel ischemic disease.  CT angio Head and Neck with contrast(Personally reviewed): 1. No large vessel occlusion. 2. Extensive intracranial atherosclerosis as detailed above, including a progressive, severe basilar artery stenosis. 3. 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 4.  Aortic Atherosclerosis (ICD10-I70.0).  ASSESSMENT   LAVETA GILKEY is a 83 y.o. adult presenting with transient worsening of her left-sided symptoms.  Etiology stroke/TIA versus possible recrudescence but in the absence of any reported recent systemic symptoms and well-appearing patient, will err on the side of assessing this as TIA/stroke  RECOMMENDATIONS  # TIA/stroke workup - Stroke labs HgbA1c, fasting lipid panel - MRI brain  - CTA head and neck completed - Frequent neuro checks - Echocardiogram - Start aspirin 81 mg daily, course to be determined pending full workup - Continue home Plavix 75 mg daily - Risk factor modification - Telemetry monitoring; 30 day event monitor on discharge if no arrythmias captured  - Blood pressure goal   - Permissive hypertension to 220/120 due to acute stroke until MRI completed, - If MRI negative for acute stroke blood pressure goal below 130/90  - PT consult, OT consult, Speech consult, unless patient is back to baseline - Stroke team to follow in consultation  ______________________________________________________________________  Signed, Gordy Councilman, Jill Rose [facility]

## 2023-10-15 ENCOUNTER — Encounter (HOSPITAL_COMMUNITY): Payer: Self-pay | Admitting: Internal Medicine

## 2023-10-15 ENCOUNTER — Observation Stay (HOSPITAL_BASED_OUTPATIENT_CLINIC_OR_DEPARTMENT_OTHER)

## 2023-10-15 DIAGNOSIS — L8990 Pressure ulcer of unspecified site, unspecified stage: Secondary | ICD-10-CM | POA: Insufficient documentation

## 2023-10-15 DIAGNOSIS — I69354 Hemiplegia and hemiparesis following cerebral infarction affecting left non-dominant side: Secondary | ICD-10-CM

## 2023-10-15 DIAGNOSIS — E785 Hyperlipidemia, unspecified: Secondary | ICD-10-CM | POA: Diagnosis not present

## 2023-10-15 DIAGNOSIS — F0150 Vascular dementia without behavioral disturbance: Secondary | ICD-10-CM

## 2023-10-15 DIAGNOSIS — G459 Transient cerebral ischemic attack, unspecified: Secondary | ICD-10-CM

## 2023-10-15 DIAGNOSIS — E039 Hypothyroidism, unspecified: Secondary | ICD-10-CM

## 2023-10-15 DIAGNOSIS — N390 Urinary tract infection, site not specified: Secondary | ICD-10-CM

## 2023-10-15 DIAGNOSIS — R8271 Bacteriuria: Secondary | ICD-10-CM

## 2023-10-15 DIAGNOSIS — E871 Hypo-osmolality and hyponatremia: Secondary | ICD-10-CM

## 2023-10-15 DIAGNOSIS — Z7902 Long term (current) use of antithrombotics/antiplatelets: Secondary | ICD-10-CM

## 2023-10-15 DIAGNOSIS — F0390 Unspecified dementia without behavioral disturbance: Secondary | ICD-10-CM

## 2023-10-15 DIAGNOSIS — D649 Anemia, unspecified: Secondary | ICD-10-CM

## 2023-10-15 DIAGNOSIS — E119 Type 2 diabetes mellitus without complications: Secondary | ICD-10-CM

## 2023-10-15 LAB — ECHOCARDIOGRAM COMPLETE
AR max vel: 1.39 cm2
AV Area VTI: 1.35 cm2
AV Area mean vel: 1.36 cm2
AV Mean grad: 13 mmHg
AV Peak grad: 22.7 mmHg
Ao pk vel: 2.38 m/s
Area-P 1/2: 2.22 cm2
Height: 62 in
MV VTI: 1.18 cm2
S' Lateral: 2.5 cm
Weight: 2095.25 [oz_av]

## 2023-10-15 LAB — CBC
HCT: 27.4 % — ABNORMAL LOW (ref 36.0–46.0)
Hemoglobin: 9.1 g/dL — ABNORMAL LOW (ref 12.0–15.0)
MCH: 27.7 pg (ref 26.0–34.0)
MCHC: 33.2 g/dL (ref 30.0–36.0)
MCV: 83.5 fL (ref 80.0–100.0)
Platelets: 191 10*3/uL (ref 150–400)
RBC: 3.28 MIL/uL — ABNORMAL LOW (ref 3.87–5.11)
RDW: 15 % (ref 11.5–15.5)
WBC: 7 10*3/uL (ref 4.0–10.5)
nRBC: 0 % (ref 0.0–0.2)

## 2023-10-15 LAB — URINE CULTURE: Culture: <10000 — AB

## 2023-10-15 LAB — BASIC METABOLIC PANEL WITH GFR
Anion gap: 8 (ref 5–15)
BUN: 30 mg/dL — ABNORMAL HIGH (ref 8–23)
CO2: 25 mmol/L (ref 22–32)
Calcium: 8.8 mg/dL — ABNORMAL LOW (ref 8.9–10.3)
Chloride: 105 mmol/L (ref 98–111)
Creatinine, Ser: 0.92 mg/dL (ref 0.44–1.00)
GFR, Estimated: >60 mL/min (ref >60)
Glucose, Bld: 84 mg/dL (ref 70–99)
Potassium: 4.2 mmol/L (ref 3.5–5.1)
Sodium: 138 mmol/L (ref 135–145)

## 2023-10-15 LAB — LIPID PANEL
Cholesterol: 104 mg/dL (ref 0–200)
HDL: 46 mg/dL (ref >40)
LDL Cholesterol: 54 mg/dL (ref 0–99)
Total CHOL/HDL Ratio: 2.3 ratio
Triglycerides: 20 mg/dL (ref <150)
VLDL: 4 mg/dL (ref 0–40)

## 2023-10-15 LAB — GLUCOSE, CAPILLARY
Glucose-Capillary: 74 mg/dL (ref 70–99)
Glucose-Capillary: 108 mg/dL — ABNORMAL HIGH (ref 70–99)
Glucose-Capillary: 102 mg/dL — ABNORMAL HIGH (ref 70–99)
Glucose-Capillary: 99 mg/dL (ref 70–99)

## 2023-10-15 LAB — HEMOGLOBIN A1C
Hgb A1c MFr Bld: 5.3 % (ref 4.8–5.6)
Mean Plasma Glucose: 105.41 mg/dL

## 2023-10-15 MED ORDER — ACETAMINOPHEN 650 MG RE SUPP: 650.0000 mg | Freq: Four times a day (QID) | RECTAL | Status: DC | PRN

## 2023-10-15 MED ORDER — ACETAMINOPHEN 325 MG PO TABS: 650.0000 mg | ORAL_TABLET | Freq: Four times a day (QID) | ORAL | Status: DC | PRN

## 2023-10-15 MED ORDER — LEVOTHYROXINE SODIUM 100 MCG PO TABS
100.0000 ug | ORAL_TABLET | Freq: Every day | ORAL | Status: DC
Start: 2023-10-15 — End: 2023-10-16
  Administered 2023-10-15 – 2023-10-16 (×2): 100 ug via ORAL
  Filled 2023-10-15 (×2): qty 1

## 2023-10-15 MED ORDER — AMLODIPINE BESYLATE 5 MG PO TABS
5.0000 mg | ORAL_TABLET | Freq: Every day | ORAL | Status: DC
  Administered 2023-10-15 – 2023-10-16 (×2): 5 mg via ORAL
  Filled 2023-10-15 (×2): qty 1

## 2023-10-15 MED ORDER — SODIUM CHLORIDE 0.9 % IV SOLN: INTRAVENOUS | Status: DC

## 2023-10-15 MED ORDER — VITAMIN D 25 MCG (1000 UNIT) PO TABS
1000.0000 [IU] | ORAL_TABLET | Freq: Every day | ORAL | Status: DC
  Administered 2023-10-15 – 2023-10-16 (×2): 1000 [IU] via ORAL
  Filled 2023-10-15 (×2): qty 1

## 2023-10-15 MED ORDER — MEMANTINE HCL 10 MG PO TABS
10.0000 mg | ORAL_TABLET | Freq: Two times a day (BID) | ORAL | Status: DC
  Administered 2023-10-15 – 2023-10-16 (×3): 10 mg via ORAL
  Filled 2023-10-15 (×3): qty 1

## 2023-10-15 MED ORDER — ENOXAPARIN SODIUM 40 MG/0.4ML IJ SOSY
40.0000 mg | PREFILLED_SYRINGE | INTRAMUSCULAR | Status: DC
  Administered 2023-10-15 – 2023-10-16 (×2): 40 mg via SUBCUTANEOUS
  Filled 2023-10-15 (×2): qty 0.4

## 2023-10-15 MED ORDER — PERFLUTREN LIPID MICROSPHERE
1.0000 mL | INTRAVENOUS | Status: AC | PRN
  Administered 2023-10-15: 3 mL via INTRAVENOUS

## 2023-10-15 MED ORDER — SODIUM CHLORIDE 0.9 % IV SOLN: INTRAVENOUS | Status: AC

## 2023-10-15 MED ORDER — SODIUM CHLORIDE 0.9 % IV SOLN
2.0000 g | INTRAVENOUS | Status: DC
  Administered 2023-10-15 – 2023-10-16 (×2): 2 g via INTRAVENOUS
  Filled 2023-10-15 (×2): qty 20

## 2023-10-15 MED ORDER — ESCITALOPRAM OXALATE 10 MG PO TABS
10.0000 mg | ORAL_TABLET | Freq: Every morning | ORAL | Status: DC
  Administered 2023-10-15 – 2023-10-16 (×2): 10 mg via ORAL
  Filled 2023-10-15 (×2): qty 1

## 2023-10-15 NOTE — Care Management Obs Status (Signed)
 MEDICARE OBSERVATION STATUS NOTIFICATION   Patient Details  Name: Jill Rose MRN: [id number] Date of Birth: [birth year]   Medicare Observation Status Notification Given:  Yes    Kermit Balo, RN 10/15/2023, 1:47 PM

## 2023-10-15 NOTE — Evaluation (Signed)
 Speech Language Pathology Evaluation Patient Details Name: Jill Rose MRN: [id number] DOB: 02-19-1941 Today's Date: 10/15/2023 Time: 1016-1029 SLP Time Calculation (min) (ACUTE ONLY): 13 min  Problem List:  Patient Active Problem List   Diagnosis Date Noted   Normocytic anemia 10/15/2023   Asymptomatic bacteriuria 10/15/2023   Dementia (HCC) 10/15/2023   TIA (transient ischemic attack) 10/15/2023   Primary osteoarthritis of left knee 08/30/2020   Controlled type 2 diabetes mellitus with hyperglycemia, without long-term current use of insulin (HCC) 04/06/2020   Abnormality of gait 02/03/2020   Benign essential HTN    CKD (chronic kidney disease), stage II    Hemiparesis affecting left side as late effect of stroke (HCC)    Labile blood glucose    Labile blood pressure    Elevated BUN    Hyponatremia    Diabetic peripheral neuropathy (HCC)    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   Dyslipidemia    Class 1 obesity due to excess calories with serious comorbidity and body mass index (BMI) of 30.0 to 30.9 in adult    Controlled type 2 diabetes mellitus with hyperglycemia (HCC)    Morbid obesity (HCC)    Leukocytosis    History of TIA (transient ischemic attack)    Acute CVA (cerebrovascular accident) (HCC) 01/06/2020   Acute ischemic stroke (HCC) 01/05/2020   Hypertensive emergency 01/26/2017   Hypothyroidism 01/26/2017   Aphasia    Essential hypertension    Diet-controlled diabetes mellitus (HCC)    Past Medical History:  Past Medical History:  Diagnosis Date   Diabetes mellitus without complication (HCC)    Essential hypertension    High cholesterol    Hypothyroidism    Stroke (HCC)    TIA (transient ischemic attack) 2018   Past Surgical History: History reviewed. No pertinent surgical history. HPI:  Jill Rose is a 83 y.o. female presenting 3/30 to ED as a code stroke for evaluation of increased L sided weakness from baseline, confusion, and  slurred speech. CT head negative for acute intracranial abnormality. CTA head and neck negative for LVO. Brain MRI negative for acute intracranial abnormality. PMHx includes diabetes, HTN, HLD, hypothyroidism, mixed vascular dementia, stroke, TIA   Assessment / Plan / Recommendation Clinical Impression  Patient presents with cognitive linguistic deficits that are aligned with her baseline performance. No acute changes to thinking skills nor speech/language endorsed by patient, although family not present to corroborate. Patient received OTPT SLP services in 2022 with notes endorses memory impairments. Patient endorses baseline memory deficits and notes that she writes things down to compensate. Patient has personal care attendant to assist with all iADLs as needed and lives with her spouse. Patient demonstrates impairments in attention, working memory, Arts administrator, delayed recall, organization, and executive functioning which likely are unchanged. No deficits in the areas of speech nor receptive/expressive language noted. SLP will sign off.    SLP Assessment  SLP Recommendation/Assessment: Patient does not need any further Speech Lanaguage Pathology Services SLP Visit Diagnosis: Cognitive communication deficit (R41.841)    Recommendations for follow up therapy are one component of a multi-disciplinary discharge planning process, led by the attending physician.  Recommendations may be updated based on patient status, additional functional criteria and insurance authorization.    Follow Up Recommendations  No SLP follow up    Assistance Recommended at Discharge  Frequent or constant Supervision/Assistance  Functional Status Assessment Patient has not had a recent decline in their functional status  Frequency and Duration  SLP Evaluation Cognition  Overall Cognitive Status: History of cognitive impairments - at baseline Arousal/Alertness: Awake/alert Orientation Level: Oriented  X4 Year: Other (Comment) (2004) Month: March Day of Week: Incorrect Attention: Sustained Sustained Attention: Impaired Sustained Attention Impairment: Functional basic;Verbal complex Memory: Impaired Memory Impairment: Storage deficit;Retrieval deficit;Decreased recall of new information Awareness: Impaired Awareness Impairment: Intellectual impairment;Emergent impairment;Anticipatory impairment Problem Solving: Impaired Problem Solving Impairment: Verbal basic;Functional basic Executive Function: Organizing;Self Monitoring;Self Correcting Organizing: Impaired Organizing Impairment: Functional basic Self Monitoring: Impaired Self Monitoring Impairment: Functional basic Self Correcting: Impaired Self Correcting Impairment: Functional basic Safety/Judgment: Appears intact       Comprehension  Auditory Comprehension Overall Auditory Comprehension: Appears within functional limits for tasks assessed    Expression Expression Primary Mode of Expression: Verbal Verbal Expression Overall Verbal Expression: Appears within functional limits for tasks assessed Initiation: No impairment   Oral / Motor  Motor Speech Overall Motor Speech: Appears within functional limits for tasks assessed Intelligibility: Intelligible           Jeannie Done, M.A., CCC-SLP  [name] [name] 10/15/2023, 1:14 PM

## 2023-10-15 NOTE — H&P (Signed)
 History and Physical    CINDA HARA XBM:[id number] DOB: [birth year] DOA: 10/14/2023  PCP: No primary care provider on file.  Patient coming from: Home  Chief Complaint: Left-sided weakness  HPI: Jill Rose is a 83 y.o. 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  Out of window for IV thrombolytics at the time of arrival.  CT head negative for acute intracranial abnormality.  CTA head and neck negative for LVO.  Brain MRI negative for acute intracranial abnormality. Labs notable for hemoglobin 10.4 (at baseline), MCV 85.1, sodium 134, glucose 116, BUN 40, creatinine 1.0, ethanol level <10.  UA with negative nitrite, large amount of leukocytes, and microscopy showing >50 WBCs and rare bacteria.  Urine culture pending.  Neurology consulted and [facility] called to admit.  Patient denies experiencing any left-sided weakness.  She tells me that she had 2 previous strokes but did not have any residual deficits although also telling me that at baseline she is not able to walk and has to use a wheelchair to ambulate at home.  She tells me that her husband called EMS and she is not sure why.  She is reporting mild right upper extremity weakness/difficulty writing with her right hand which has been going on for several months to a year.  She has no other complaints.  Denies fevers, chills, cough, shortness breath, chest pain, nausea, vomiting, abdominal pain, diarrhea, dysuria, or urinary frequency/urgency.  Review of Systems:  Review of Systems  All other systems reviewed and are negative.   Past Medical History:  Diagnosis Date   Diabetes mellitus without complication (HCC)    Essential hypertension    High cholesterol    Hypothyroidism    Stroke [facility])     TIA (transient ischemic attack) 2018    No past surgical history on file.   reports that she has never smoked. She has never used smokeless tobacco. She reports that she does not drink alcohol and does not use drugs.  Allergies  Allergen Reactions   Ammonia Shortness Of Breath, Itching, Swelling and Other (See Comments)    Confusion, Headache     Peroxide [Hydrogen Peroxide] Shortness Of Breath, Itching, Swelling and Other (See Comments)    Confusion, Headache      Family History  Problem Relation Age of Onset   Heart disease Mother     Prior to Admission medications   Medication Sig Start Date End Date Taking? Authorizing Provider  acetaminophen (TYLENOL) 325 MG tablet Take 2 tablets (650 mg total) by mouth every 4 (four) hours as needed for mild pain (or temp > 37.5 C (99.5 F)). 01/28/20   Angiulli, Mcarthur Rossetti, PA-C  amLODipine (NORVASC) 5 MG tablet Take 5 mg by mouth daily.    [provider]  atorvastatin (LIPITOR) 80 MG tablet Take 1 tablet (80 mg total) by mouth daily. 01/28/20 04/27/20  Angiulli, Mcarthur Rossetti, PA-C  Cholecalciferol (VITAMIN D3 PO) Take 1 tablet by mouth daily with lunch.    [provider]  clopidogrel (PLAVIX) 75 MG tablet Take 1 tablet (75 mg total) by mouth daily. 10/23/22   Micki Riley, MD  diclofenac Sodium (VOLTAREN) 1 % GEL APPLY 2 GRAMS TO AFFECTED AREA 4 TIMES A DAY 11/02/20   [name], MD  escitalopram (LEXAPRO) 10 MG tablet  Take 10 mg by mouth every morning. 04/06/20   [provider]  hydrALAZINE (APRESOLINE) 10 MG tablet Take 1 tablet (10 mg total) by mouth at bedtime. 01/28/20   Angiulli, Mcarthur Rossetti, PA-C  levothyroxine (SYNTHROID) 100 MCG tablet Take 100 mcg by mouth daily. 10/04/20   [provider]  memantine (NAMENDA) 10 MG tablet Take 1 tablet (10 mg total) by mouth 2 (two) times daily. 09/24/23   Ihor Austin, NP  [facility] VERIO test strip 1 each daily. 05/24/20   [provider]  telmisartan  (MICARDIS) 80 MG tablet Take 80 mg by mouth daily. 05/06/20   [provider]    Physical Exam: Vitals:   10/14/23 1842 10/14/23 1915 10/14/23 2015 10/14/23 2219  BP:  (!) 145/50 (!) 139/53 (!) 145/57  Pulse:  87 86 89  Resp:  17 19 19   Temp:    99.1 F (37.3 C)  TempSrc:    Oral  SpO2:  100% 99% 100%  Weight: 59.4 kg       Physical Exam Vitals reviewed.  Constitutional:      General: She is not in acute distress. HENT:     Head: Normocephalic and atraumatic.  Eyes:     Extraocular Movements: Extraocular movements intact.  Cardiovascular:     Rate and Rhythm: Normal rate and regular rhythm.     Pulses: Normal pulses.  Pulmonary:     Effort: Pulmonary effort is normal. No respiratory distress.     Breath sounds: Normal breath sounds. No wheezing or rales.  Abdominal:     General: Bowel sounds are normal. There is no distension.     Palpations: Abdomen is soft.     Tenderness: There is no abdominal tenderness. There is no guarding.  Musculoskeletal:     Cervical back: Normal range of motion.     Right lower leg: No edema.     Left lower leg: No edema.  Skin:    General: Skin is warm and dry.  Neurological:     Mental Status: She is alert and oriented to person, place, and time.     Cranial Nerves: No cranial nerve deficit.     Motor: Weakness present.     Comments: Mild left lower extremity weakness No right sided upper or lower extremity weakness     Labs on Admission: I have personally reviewed following labs and imaging studies  CBC: Recent Labs  Lab 10/14/23 1915 10/14/23 1944  WBC 7.9  --   NEUTROABS 5.2  --   HGB 10.4* 11.6*  HCT 31.9* 34.0*  MCV 85.1  --   PLT 189  --    Basic Metabolic Panel: Recent Labs  Lab 10/14/23 1915 10/14/23 1944  NA 134* 135  K 4.6 4.5  CL 100 102  CO2 25  --   GLUCOSE 116* 111*  BUN 40* 42*  CREATININE 1.01* 1.10*  CALCIUM 8.7*  --    GFR: Estimated Creatinine Clearance (by C-G formula based on SCr  of 1.1 mg/dL (H)) Female: 16.1 mL/min (A) Female: 41.4 mL/min (A) Liver Function Tests: Recent Labs  Lab 10/14/23 1915  AST 23  ALT 16  ALKPHOS 48  BILITOT 0.5  PROT 6.5  ALBUMIN 3.2*   No results for input(s): "LIPASE", "AMYLASE" in the last 168 hours. No results for input(s): "AMMONIA" in the last 168 hours. Coagulation Profile: Recent Labs  Lab 10/14/23 1915  INR 1.1   Cardiac Enzymes: No results for input(s): "CKTOTAL", "CKMB", "CKMBINDEX", "  TROPONINI" in the last 168 hours. BNP (last 3 results) No results for input(s): "PROBNP" in the last 8760 hours. HbA1C: No results for input(s): "HGBA1C" in the last 72 hours. CBG: Recent Labs  Lab 10/14/23 1831  GLUCAP 123*   Lipid Profile: No results for input(s): "CHOL", "HDL", "LDLCALC", "TRIG", "CHOLHDL", "LDLDIRECT" in the last 72 hours. Thyroid Function Tests: No results for input(s): "TSH", "T4TOTAL", "FREET4", "T3FREE", "THYROIDAB" in the last 72 hours. Anemia Panel: No results for input(s): "VITAMINB12", "FOLATE", "FERRITIN", "TIBC", "IRON", "RETICCTPCT" in the last 72 hours. Urine analysis:    Component Value Date/Time   COLORURINE STRAW (A) 10/14/2023 2122   APPEARANCEUR HAZY (A) 10/14/2023 2122   LABSPEC 1.025 10/14/2023 2122   PHURINE 6.0 10/14/2023 2122   GLUCOSEU NEGATIVE 10/14/2023 2122   HGBUR NEGATIVE 10/14/2023 2122   BILIRUBINUR NEGATIVE 10/14/2023 2122   KETONESUR NEGATIVE 10/14/2023 2122   PROTEINUR NEGATIVE 10/14/2023 2122   NITRITE NEGATIVE 10/14/2023 2122   LEUKOCYTESUR LARGE (A) 10/14/2023 2122    Radiological Exams on Admission: MR BRAIN WO CONTRAST Result Date: 10/14/2023 CLINICAL DATA:  Neuro deficit, acute, stroke suspected EXAM: MRI HEAD WITHOUT CONTRAST TECHNIQUE: Multiplanar, multiecho pulse sequences of the brain and surrounding structures were obtained without intravenous contrast. COMPARISON:  CT head March 30, 25. FINDINGS: Brain: No acute infarction, hemorrhage, hydrocephalus,  extra-axial collection or mass lesion. Extensive T2/FLAIR hyperintensities the white matter, compatible chronic microvascular disease or. Remote brainstem mediated right cerebellar lacunar infarcts. Remote lacunar infarcts in bilateral corona radiata. Vascular: Major arterial flow voids are maintained at the skull base. Skull and upper cervical spine: Normal marrow signal. Sinuses/Orbits: Mild paranasal sinus disease. No acute orbital findings. Other: No mastoid effusions. IMPRESSION: 1. No evidence of acute intracranial abnormality. 2. Severe chronic microvascular ischemic disease. Electronically Signed   By: Feliberto Harts M.D.   On: 10/14/2023 21:51   CT ANGIO HEAD NECK W WO CM (CODE STROKE) Result Date: 10/14/2023 CLINICAL DATA:  Stroke/TIA, determine embolic source. Slurred speech and left-sided weakness. EXAM: CT ANGIOGRAPHY HEAD AND NECK WITH AND WITHOUT CONTRAST TECHNIQUE: Multidetector CT imaging of the head and neck was performed using the standard protocol during bolus administration of intravenous contrast. Multiplanar CT image reconstructions and MIPs were obtained to evaluate the vascular anatomy. Carotid stenosis measurements (when applicable) are obtained utilizing NASCET criteria, using the distal internal carotid diameter as the denominator. RADIATION DOSE REDUCTION: This exam was performed according to the departmental dose-optimization program which includes automated exposure control, adjustment of the mA and/or kV according to patient size and/or use of iterative reconstruction technique. CONTRAST:  75mL OMNIPAQUE IOHEXOL 350 MG/ML SOLN COMPARISON:  CTA head and neck 01/06/2020 FINDINGS: CTA NECK FINDINGS Aortic arch: Normal variant aortic arch branching pattern with common origin of the brachiocephalic and left common carotid arteries. Mild atherosclerosis without significant stenosis of the arch vessel origins. Right carotid system: Patent with predominantly calcified plaque at the  carotid bifurcation resulting in 50% stenosis of the proximal ICA, progressed from prior. Left carotid system: Patent with mixed calcified and soft plaque in the carotid bulb without evidence of a significant stenosis. Vertebral arteries: Patent with the right being dominant. No more than mild atherosclerotic narrowing of the right V1 and V2 segment. Diffusely small left vertebral artery with extensive atherosclerosis including multiple chronic severe V1 stenoses. Skeleton: Moderate cervical disc and facet degeneration. Other neck: No evidence of cervical lymphadenopathy or mass. Upper chest: Clear lung apices. Review of the MIP images confirms the above findings CTA  HEAD FINDINGS Anterior circulation: The internal carotid arteries are patent from skull base to carotid termini with atherosclerosis resulting in chronic, mild bilateral cavernous and severe right and mild left supraclinoid stenoses. ACAs and MCAs are patent without evidence of a proximal branch occlusion. A severe stenosis of the origin of the left M2 superior division is unchanged. No aneurysm is identified. Posterior circulation: The intracranial vertebral arteries are patent to the basilar with multiple severe left V4 stenoses again noted. The basilar artery is patent with a progressive, severe stenosis in its midportion. There patent posterior communicating arteries with hypoplastic or absent P1 segments bilaterally. There are chronic severe proximal and mid right P2 and bilateral distal P2 stenoses. No aneurysm is identified. Venous sinuses: Patent. Anatomic variants: Fetal origin of the PCAs. Review of the MIP images confirms the above findings Preliminary finding of no large vessel occlusion was communicated to Dr. Iver Nestle at 7:11 pm on 10/14/2023 by text page via the [facility] messaging system. IMPRESSION: 1. No large vessel occlusion. 2. Extensive intracranial atherosclerosis as detailed above, including a progressive, severe basilar artery  stenosis. 3. 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 4.  Aortic Atherosclerosis (ICD10-I70.0). Electronically Signed   By: Sebastian Ache M.D.   On: 10/14/2023 19:55   CT HEAD CODE STROKE WO CONTRAST Result Date: 10/14/2023 CLINICAL DATA:  Code stroke. Neuro deficit, acute, stroke suspected. Slurred speech and left-sided weakness. EXAM: CT HEAD WITHOUT CONTRAST TECHNIQUE: Contiguous axial images were obtained from the base of the skull through the vertex without intravenous contrast. RADIATION DOSE REDUCTION: This exam was performed according to the departmental dose-optimization program which includes automated exposure control, adjustment of the mA and/or kV according to patient size and/or use of iterative reconstruction technique. COMPARISON:  Head CT 01/11/2020 and MRI 01/06/2020 FINDINGS: Brain: There is no evidence of an acute infarct, intracranial hemorrhage, mass, midline shift, or extra-axial fluid collection. Confluent hypodensities in the cerebral white matter have progressed and are nonspecific but compatible with severe chronic small vessel ischemic disease. There are chronic lacunar infarcts in the cerebral white matter bilaterally as well as in the left basal ganglia, left thalamic capsular region, and right cerebellum. There is moderate cerebral atrophy. Vascular: Calcified atherosclerosis at the skull base. No hyperdense vessel. Skull: No acute fracture or suspicious lesion. Sinuses/Orbits: Fluid/secretions in the right sphenoid sinus. Mild bilateral ethmoid air cell opacification. Clear mastoid air cells. Bilateral cataract extraction. Other: None. ASPECTS ([place] Stroke Program Early CT Score) - Ganglionic level infarction (caudate, lentiform nuclei, internal capsule, insula, M1-M3 cortex): 7 - Supraganglionic infarction (M4-M6 cortex): 3 Total score (0-10 with 10 being normal): 10 These results were communicated to Dr. Iver Nestle at 6:50 pm on 10/14/2023 by text page via the [facility] messaging  system. IMPRESSION: 1. No evidence of acute intracranial abnormality. ASPECTS of 10. 2. Severe chronic small vessel ischemic disease. Electronically Signed   By: Sebastian Ache M.D.   On: 10/14/2023 18:50    EKG: Independently reviewed.  Sinus rhythm with mild PR prolongation, no significant change compared to previous EKGs.  Assessment and Plan  TIA Patient with history of previous right MCA stroke with residual left-sided weakness presenting with transient worsening of her left-sided weakness.  Out of window for IV thrombolytics at the time of arrival. CT head negative for acute intracranial abnormality.  CTA head and neck negative for LVO.  Brain MRI negative for acute intracranial abnormality. -Neurology consulting, appreciate recommendations -Continue home Plavix 75 mg daily and neurology has also started aspirin  81 mg daily -Continue Lipitor 80 mg daily -Telemetry monitoring; 30-day event monitor on discharge if no arrhythmias captured -Echocardiogram -Hemoglobin A1c, fasting lipid panel -Frequent neurochecks -Risk factor modification -PT/OT/SLP eval  Normocytic anemia Hemoglobin at baseline, continue to monitor labs.  Mild hyponatremia Suspect due to hypovolemia/dehydration as BUN is also elevated.  Previous echo with preserved EF.  Give gentle IV fluid hydration with normal saline and monitor labs.  Asymptomatic bacteriuria UA with evidence of pyuria and bacteriuria although patient is not endorsing any urinary symptoms.  No fever or leukocytosis.  Will hold off starting antibiotics at this time.  Follow-up urine culture.  Diet controlled type 2 diabetes A1c 5.2 in December 2024, repeat ordered.  CBG checks ACHS.  Hypertension Since MRI is negative for acute stroke, neurology recommending normotension.  Resume home meds after pharmacy med rec is done.  Hypothyroidism Vascular dementia Continue home meds after pharmacy med rec is done.  DVT prophylaxis: Lovenox Code  Status: Full Code (discussed with the patient) Family Communication: No family available at this time. Consults called: Neurology Level of care: Telemetry bed Admission status: It is my clinical opinion that referral for OBSERVATION is reasonable and necessary in this patient based on the above information provided. The aforementioned taken together are felt to place the patient at high risk for further clinical deterioration. However, it is anticipated that the patient may be medically stable for discharge from the hospital within 24 to 48 hours.  John Giovanni MD [facility]  If 7PM-7AM, please contact night-coverage [email or web address]  10/15/2023, 12:40 AM

## 2023-10-15 NOTE — Progress Notes (Addendum)
 STROKE TEAM PROGRESS NOTE   INTERIM HISTORY/SUBJECTIVE MRI negative for any acute infarct.  UA is positive for a UTI.  We will additionally check P2Y12 in morning  Family is at the bedside.  They states she seems to be at her baseline.  Patient herself states she is 100% back to her baseline  OBJECTIVE  CBC    Component Value Date/Time   WBC 7.0 10/15/2023 0704   RBC 3.28 (L) 10/15/2023 0704   HGB 9.1 (L) 10/15/2023 0704   HCT 27.4 (L) 10/15/2023 0704   PLT 191 10/15/2023 0704   MCV 83.5 10/15/2023 0704   MCH 27.7 10/15/2023 0704   MCHC 33.2 10/15/2023 0704   RDW 15.0 10/15/2023 0704   LYMPHSABS 1.6 10/14/2023 1915   MONOABS 0.8 10/14/2023 1915   EOSABS 0.3 10/14/2023 1915   BASOSABS 0.0 10/14/2023 1915    BMET    Component Value Date/Time   NA 138 10/15/2023 0704   K 4.2 10/15/2023 0704   CL 105 10/15/2023 0704   CO2 25 10/15/2023 0704   GLUCOSE 84 10/15/2023 0704   BUN 30 (H) 10/15/2023 0704   CREATININE 0.92 10/15/2023 0704   CALCIUM 8.8 (L) 10/15/2023 0704   GFRNONAA >60 10/15/2023 0704    IMAGING past 24 hours MR BRAIN WO CONTRAST Result Date: 10/14/2023 CLINICAL DATA:  Neuro deficit, acute, stroke suspected EXAM: MRI HEAD WITHOUT CONTRAST TECHNIQUE: Multiplanar, multiecho pulse sequences of the brain and surrounding structures were obtained without intravenous contrast. COMPARISON:  CT head March 30, 25. FINDINGS: Brain: No acute infarction, hemorrhage, hydrocephalus, extra-axial collection or mass lesion. Extensive T2/FLAIR hyperintensities the white matter, compatible chronic microvascular disease or. Remote brainstem mediated right cerebellar lacunar infarcts. Remote lacunar infarcts in bilateral corona radiata. Vascular: Major arterial flow voids are maintained at the skull base. Skull and upper cervical spine: Normal marrow signal. Sinuses/Orbits: Mild paranasal sinus disease. No acute orbital findings. Other: No mastoid effusions. IMPRESSION: 1. No evidence of  acute intracranial abnormality. 2. Severe chronic microvascular ischemic disease. Electronically Signed   By: Feliberto Harts M.D.   On: 10/14/2023 21:51   CT ANGIO HEAD NECK W WO CM (CODE STROKE) Result Date: 10/14/2023 CLINICAL DATA:  Stroke/TIA, determine embolic source. Slurred speech and left-sided weakness. EXAM: CT ANGIOGRAPHY HEAD AND NECK WITH AND WITHOUT CONTRAST TECHNIQUE: Multidetector CT imaging of the head and neck was performed using the standard protocol during bolus administration of intravenous contrast. Multiplanar CT image reconstructions and MIPs were obtained to evaluate the vascular anatomy. Carotid stenosis measurements (when applicable) are obtained utilizing NASCET criteria, using the distal internal carotid diameter as the denominator. RADIATION DOSE REDUCTION: This exam was performed according to the departmental dose-optimization program which includes automated exposure control, adjustment of the mA and/or kV according to patient size and/or use of iterative reconstruction technique. CONTRAST:  75mL OMNIPAQUE IOHEXOL 350 MG/ML SOLN COMPARISON:  CTA head and neck 01/06/2020 FINDINGS: CTA NECK FINDINGS Aortic arch: Normal variant aortic arch branching pattern with common origin of the brachiocephalic and left common carotid arteries. Mild atherosclerosis without significant stenosis of the arch vessel origins. Right carotid system: Patent with predominantly calcified plaque at the carotid bifurcation resulting in 50% stenosis of the proximal ICA, progressed from prior. Left carotid system: Patent with mixed calcified and soft plaque in the carotid bulb without evidence of a significant stenosis. Vertebral arteries: Patent with the right being dominant. No more than mild atherosclerotic narrowing of the right V1 and V2 segment. Diffusely small left vertebral  artery with extensive atherosclerosis including multiple chronic severe V1 stenoses. Skeleton: Moderate cervical disc and facet  degeneration. Other neck: No evidence of cervical lymphadenopathy or mass. Upper chest: Clear lung apices. Review of the MIP images confirms the above findings CTA HEAD FINDINGS Anterior circulation: The internal carotid arteries are patent from skull base to carotid termini with atherosclerosis resulting in chronic, mild bilateral cavernous and severe right and mild left supraclinoid stenoses. ACAs and MCAs are patent without evidence of a proximal branch occlusion. A severe stenosis of the origin of the left M2 superior division is unchanged. No aneurysm is identified. Posterior circulation: The intracranial vertebral arteries are patent to the basilar with multiple severe left V4 stenoses again noted. The basilar artery is patent with a progressive, severe stenosis in its midportion. There patent posterior communicating arteries with hypoplastic or absent P1 segments bilaterally. There are chronic severe proximal and mid right P2 and bilateral distal P2 stenoses. No aneurysm is identified. Venous sinuses: Patent. Anatomic variants: Fetal origin of the PCAs. Review of the MIP images confirms the above findings Preliminary finding of no large vessel occlusion was communicated to Dr. Iver Nestle at 7:11 pm on 10/14/2023 by text page via the [facility] messaging system. IMPRESSION: 1. No large vessel occlusion. 2. Extensive intracranial atherosclerosis as detailed above, including a progressive, severe basilar artery stenosis. 3. 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 4.  Aortic Atherosclerosis (ICD10-I70.0). Electronically Signed   By: Sebastian Ache M.D.   On: 10/14/2023 19:55   CT HEAD CODE STROKE WO CONTRAST Result Date: 10/14/2023 CLINICAL DATA:  Code stroke. Neuro deficit, acute, stroke suspected. Slurred speech and left-sided weakness. EXAM: CT HEAD WITHOUT CONTRAST TECHNIQUE: Contiguous axial images were obtained from the base of the skull through the vertex without intravenous contrast. RADIATION DOSE REDUCTION:  This exam was performed according to the departmental dose-optimization program which includes automated exposure control, adjustment of the mA and/or kV according to patient size and/or use of iterative reconstruction technique. COMPARISON:  Head CT 01/11/2020 and MRI 01/06/2020 FINDINGS: Brain: There is no evidence of an acute infarct, intracranial hemorrhage, mass, midline shift, or extra-axial fluid collection. Confluent hypodensities in the cerebral white matter have progressed and are nonspecific but compatible with severe chronic small vessel ischemic disease. There are chronic lacunar infarcts in the cerebral white matter bilaterally as well as in the left basal ganglia, left thalamic capsular region, and right cerebellum. There is moderate cerebral atrophy. Vascular: Calcified atherosclerosis at the skull base. No hyperdense vessel. Skull: No acute fracture or suspicious lesion. Sinuses/Orbits: Fluid/secretions in the right sphenoid sinus. Mild bilateral ethmoid air cell opacification. Clear mastoid air cells. Bilateral cataract extraction. Other: None. ASPECTS ([place] Stroke Program Early CT Score) - Ganglionic level infarction (caudate, lentiform nuclei, internal capsule, insula, M1-M3 cortex): 7 - Supraganglionic infarction (M4-M6 cortex): 3 Total score (0-10 with 10 being normal): 10 These results were communicated to Dr. Iver Nestle at 6:50 pm on 10/14/2023 by text page via the [facility] messaging system. IMPRESSION: 1. No evidence of acute intracranial abnormality. ASPECTS of 10. 2. Severe chronic small vessel ischemic disease. Electronically Signed   By: Sebastian Ache M.D.   On: 10/14/2023 18:50    Vitals:   10/14/23 2219 10/15/23 0137 10/15/23 0356 10/15/23 0815  BP: (!) 145/57  (!) 140/55 (!) 140/58  Pulse: 89  74 74  Resp: 19  18   Temp: 99.1 F (37.3 C)  98.9 F (37.2 C) 98.4 F (36.9 C)  TempSrc: Oral  Oral Oral  SpO2: 100%  100% 98%  Weight:      Height:  5\' 2"  (1.575 m)       PHYSICAL  EXAM General:  Alert, well-nourished, well-developed patient in no acute distress Psych:  Mood and affect appropriate for situation CV: Regular rate and rhythm on monitor Respiratory:  Regular, unlabored respirations on room air GI: Abdomen soft and nontender   NEURO:  Mental Status: Alert, oriented to self, incorrect age.  States correct year but incorrect month.  Knows she is in the hospital. Speech/Language: speech is without dysarthria or aphasia.  Naming, repetition, fluency, and comprehension intact.  Cranial Nerves:  II: PERRL. Visual fields full.  III, IV, VI: EOMI. Eyelids elevate symmetrically.  V: Sensation is intact to light touch and symmetrical to face.  VII: Face is symmetrical resting and smiling VIII: hearing intact to voice. IX, X: Palate elevates symmetrically. Phonation is normal.  WU:[id number] shrug 5/5. XII: tongue is midline without fasciculations. Motor: 5/5 strength in bilateral upper extremities 4/5 strength in bilateral lower extremities-this is baseline Tone: is normal and bulk is normal Sensation- Intact to light touch bilaterally. Extinction absent to light touch to DSS.   Coordination: FTN intact bilaterally Gait- deferred-patient is nonambulatory at baseline  ASSESSMENT/PLAN  [name] is a 83 y.o. 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  NIH on Admission 6  Recrudescence of old stroke in the setting of UTI, less likely TIA Code Stroke CT head No acute abnormality. ASPECTS 10 CTA head & neck Extensive intracranial atherosclerosis as detailed above, including a progressive, severe basilar artery stenosis. 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 MRI no acute intracranial abnormality, severe chronic microvascular ischemic disease 2D Echo  Pending  LDL 54 HgbA1c 5.3 VTE prophylaxis - Lovenox clopidogrel 75 mg daily prior to admission, now on home clopidogrel 75 mg daily. Therapy recommendations:  HH PT and OT Disposition:  Pending   Hx of Stroke/TIA 01/2017 admitted for left BG/CR infarct.  MRI showed left VA occlusion, and moderate right P2 stenosis.  EF 55 to 60%.  LDL 168, A1c 8.5.  Change aspirin to Plavix.  Also discharged on Lipitor 40. 01/05/2020 to [facility] with left-sided weakness which started a week prior to presentation.  Found to have right MCA infarcts.  MRA showed multifocal atherosclerosis, CTA head and neck showed severe intracranial atherosclerosis.EF 65 to 70%.  LDL 196, A1c 7.6.  Discharged on DAPT and Lipitor 80.  Hypertension Home meds:  Amlodipine, telmisartan, hydralazine  Stable Long-term BP goal normotension   Hyperlipidemia Home meds: Atorvastatin 80 mg, resumed in hospital LDL 54, goal < 70 High intensity statin not indicated  Continue statin at discharge  UTI UA WBC more than 50 Culture pending On ceftriaxone  Other stroke risk factors Advanced age  Other Active Problems Vascular dementia Home meds-Namenda and Lexapro  Hospital day # 0  Patient seen and examined by NP/APP with MD. MD to update note as needed.   Jill Picker, DNP, FNP-BC [facility] Pager: [phone]  ATTENDING NOTE: I reviewed above note and agree with the assessment and Rose. Pt was seen and examined.   Echo tech in room for echo testing.  Patient lying bed, awake, alert, eyes open, orientated to age, place, time but not to situation. No aphasia, mild dysarthria, paucity of speech, following all simple commands. Able to  name 2/4 and repeat single sentence in mild dysarthria voice. No gaze palsy, tracking bilaterally, visual field full.  Mild right facial droop. Tongue midline. Bilateral UEs 5/5, no drift. Bilaterally LEs 4/5, no drift. Sensation symmetrical bilaterally, b/l FTN intact,  gait not tested.   Patient stroke workup this time negative.  Symptoms likely from recrudescence from old infarct in the setting of UTI.  Patient denies any change from baseline at this time.  On Rocephin treatment for UTI, continue home Plavix and statin.  PT and OT recommend home health  For detailed assessment and Rose, please refer to above/below as I have made changes wherever appropriate.   Neurology will sign off. Please call with questions. Pt will follow up with stroke clinic NP Ihor Austin at [facility] in about 4 weeks. Thanks for the consult.   Jill Plan, MD PhD Stroke Neurology 10/15/2023 4:14 PM     To contact Stroke Continuity provider, please refer to [email or web address]. After hours, contact General Neurology

## 2023-10-15 NOTE — Progress Notes (Signed)
 Echocardiogram 2D Echocardiogram has been performed.  Jill Rose 10/15/2023, 3:18 PM

## 2023-10-15 NOTE — Evaluation (Signed)
 Physical Therapy Evaluation Patient Details Name: Jill Rose MRN: [id number] DOB: [birth year] Today's Date: 10/15/2023  History of Present Illness  Jill Rose is a 83 y.o. female presenting 3/30 to ED as a code stroke for evaluation of increased L sided weakness from baseline, confusion, and slurred speech. CT head negative for acute intracranial abnormality. CTA head and neck negative for LVO. Brain MRI negative for acute intracranial abnormality. PMHx includes diabetes, HTN, HLD, hypothyroidism, mixed vascular dementia, stroke, TIA   Clinical Impression  EMER ONNEN is 83 y.o. female admitted with above HPI and diagnosis. Patient is currently limited by functional impairments below (see PT problem list). Patient lives with spouse and has personal care attendant 7 days/week from 8 am-8 pm. At baseline pt requires Max assist for bed mobility stand pivot transfers and for ADL's. Pt's husband also requires some assist from aids at home and they provide all assist with meals and medications and Mobility and ADL's for both. Patient will benefit from continued skilled PT interventions to address impairments and progress independence with mobility, recommending return home with assist from aids and HHPT. Acute PT will follow and progress as able.         If plan is discharge home, recommend the following: Two people to help with walking and/or transfers;A lot of help with bathing/dressing/bathroom;Assistance with cooking/housework;Direct supervision/assist for medications management;Assist for transportation;Help with stairs or ramp for entrance   Can travel by private vehicle        Equipment Recommendations None recommended by PT  Recommendations for Other Services       Functional Status Assessment Patient has not had a recent decline in their functional status     Precautions / Restrictions Precautions Precautions: Fall Restrictions Weight Bearing Restrictions Per Provider Order:  No      Mobility  Bed Mobility               General bed mobility comments: pt OOB    Transfers Overall transfer level: Needs assistance Equipment used: None Transfers: Sit to/from Stand Sit to Stand: Max assist (Simultaneous filing. User may not have seen previous data.)           General transfer comment: 2x sit<>stand from recliner, max assist with cues for anterior trunk weight shift and power up through UE's and attempt to press through bil LE, limited greatly by LE weakness.    Ambulation/Gait                  Stairs            Wheelchair Mobility     Tilt Bed    Modified Rankin (Stroke Patients Only)       Balance Overall balance assessment: Needs assistance Sitting-balance support: Feet supported, Bilateral upper extremity supported Sitting balance-Leahy Scale: Poor Sitting balance - Comments: back supported in recliner   Standing balance support: Bilateral upper extremity supported Standing balance-Leahy Scale: Zero Standing balance comment: Max assist                             Pertinent Vitals/Pain Pain Assessment Pain Assessment: No/denies pain    Home Living Family/patient expects to be discharged to:: Private residence Living Arrangements: Spouse/significant other Available Help at Discharge: Personal care attendant;Family Type of Home: House           Home Equipment: Wheelchair - manual;Grab bars - toilet;Grab bars - tub/shower;Shower seat Additional Comments: Pt has PCA  everyday from 8 AM-8PM that assists with all ADLs/IADLs. pt reports they really come 9-7 typically. She stays in bed until they arrive in the morning and they put her to bed after. Pt reports the PCA that provides assistance on Sunday and Monday is not able to provide heavy physical assist    Prior Function Prior Level of Function : Needs assist  Cognitive Assist : ADLs (cognitive)   ADLs (Cognitive): Intermittent cues Physical Assist  : Mobility (physical);ADLs (physical) Mobility (physical): Bed mobility;Transfers ADLs (physical): Bathing;Dressing;Toileting;IADLs Mobility Comments: Pt reports being able to stand to pivot to bed, wheelchair, commode but uses a w/c for mobility at baseline ADLs Comments: Pt reports PCA assists with bathing dressing, medication management, meals, etc. but she was able to feed herself and complete grooming task     Extremity/Trunk Assessment   Upper Extremity Assessment Upper Extremity Assessment: Defer to OT evaluation;Generalized weakness    Lower Extremity Assessment Lower Extremity Assessment: LLE deficits/detail;RLE deficits/detail;Generalized weakness    Muscle bulk: normal, tone normal, pronator drift none tremor none  MMT: Mvmt Root Nerve  Muscle Right Left Comments  HF L1/2/3 Fem Illopsoas 3 2+    KE L2/3/4 Fem Quad  4-  3-    KF L5/S1/2 Sciatic Ham 4 4-   DF L4/5 D Peron Tib Ant 4 3-    PF S1/2 Tibial Grc/Sol 4 3     Sensation:  Light touch intact   Pin prick  NT   Temperature  NT   Vibration  NT  Proprioception  intact    Coordination/Complex Motor:  - Finger to Nose: NT - Heel to shin: NT - Rapid alternating movement: NT - Gait: NT  Cervical / Trunk Assessment Cervical / Trunk Assessment: Normal  Communication   Communication Communication: Impaired Factors Affecting Communication: Reduced clarity of speech    Cognition Arousal: Alert Behavior During Therapy: WFL for tasks assessed/performed, Flat affect   PT - Cognitive impairments: No apparent impairments                         Following commands: Intact       Cueing Cueing Techniques: Verbal cues, Tactile cues     General Comments      Exercises     Assessment/Plan    PT Assessment Patient needs continued PT services  PT Problem List Decreased strength;Decreased activity tolerance;Decreased balance;Decreased mobility;Decreased coordination;Decreased knowledge of use of  DME;Obesity       PT Treatment Interventions DME instruction;Functional mobility training;Therapeutic activities;Therapeutic exercise;Balance training;Neuromuscular re-education;Cognitive remediation;Patient/family education;Wheelchair mobility training;Manual techniques    PT Goals (Current goals can be found in the Care Plan section)  Acute Rehab PT Goals Patient Stated Goal: get better help from Sunday and Monday, improve transfers and go home PT Goal Formulation: With patient Time For Goal Achievement: 10/29/23 Potential to Achieve Goals: Fair    Frequency Min 1X/week     Co-evaluation               AM-PAC PT "6 Clicks" Mobility  Outcome Measure Help needed turning from your back to your side while in a flat bed without using bedrails?: Total Help needed moving from lying on your back to sitting on the side of a flat bed without using bedrails?: Total Help needed moving to and from a bed to a chair (including a wheelchair)?: Total Help needed standing up from a chair using your arms (e.g., wheelchair or bedside chair)?: Total Help needed to  walk in hospital room?: Total Help needed climbing 3-5 steps with a railing? : Total 6 Click Score: 6    End of Session Equipment Utilized During Treatment: Gait belt Activity Tolerance: Patient tolerated treatment well Patient left: in chair;with call bell/phone within reach;with chair alarm set Nurse Communication: Mobility status PT Visit Diagnosis: Muscle weakness (generalized) (M62.81);Other abnormalities of gait and mobility (R26.89);Difficulty in walking, not elsewhere classified (R26.2);Other symptoms and signs involving the nervous system (R29.898)    Time: 0981-1914 PT Time Calculation (min) (ACUTE ONLY): 20 min   Charges:   PT Evaluation $PT Eval Moderate Complexity: 1 Mod   PT General Charges $$ ACUTE PT VISIT: 1 Visit         Wynn Maudlin, DPT Acute Rehabilitation Services Office [phone]  10/15/23  11:09 AM

## 2023-10-15 NOTE — Progress Notes (Signed)
 PROGRESS NOTE    Jill Rose  ZOX:[id number] DOB: [birth year] DOA: 10/14/2023 PCP: No primary care provider on file. (Confirm with patient/family/NH records and if not entered, this HAS to be entered at [facility] point of entry. 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  MRI brain done negative for any acute abnormalities.  CT head done negative for acute abnormalities.  CTA head and neck negative for LVO.  Urinalysis concerning for UTI.  Patient placed on IV antibiotics.  Neurology consulted and recommended admission for evaluation due to concerns for possible TIA.   Assessment & Plan:   Principal Problem:   TIA (transient ischemic attack) Active Problems:   Hypothyroidism   Diet-controlled diabetes mellitus (HCC)   Hyponatremia   Normocytic anemia   Asymptomatic bacteriuria   Dementia (HCC)   Urinary tract infection without hematuria   Hyperlipidemia   Pressure injury of skin  #1 TIA/stroke workup/left-sided weakness/versus recrudescence of old CVA in the setting of UTI -Patient presented transient worsening of left-sided weakness with improvement with symptoms. -Concern for CVA versus TIA. -CT head done negative for any acute abnormalities. -MRI brain negative for any acute abnormalities. -CT angiogram head and neck done with no LVO, extensive intracranial atherosclerosis including a progressive, severe basilar artery stenosis.  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  Aortic atherosclerosis. -Hemoglobin A1c noted at 5.3. -Fasting lipid panel with LDL of 54. -2D echo done with EF of 70  to 75%,NWMA, moderate mitral stenosis, mild to moderate aortic valve stenosis, no atrial level shunt detected by color-flow Doppler. -PT/OT. -Continue home regimen Plavix, statin. -Urinalysis concerning for UTI. -Urine cultures pending. -Continue IV Rocephin pending urine culture results. -Neurology following.  2.  UTI -Urine cultures pending. -IV Rocephin.  3.  Hyperlipidemia -LDL of 54. -Continue statin.  4.  Hypothyroidism -Continue Synthroid.  5.  Dementia -Stable. -Continue Namenda.  6.  Hypertension -Norvasc 5 mg daily.  7.  Diet-controlled type 2 diabetes mellitus -Hemoglobin A1c 5.3. -Outpatient follow-up.  8.  Mild hyponatremia -Improved with hydration.  9. Moderate Mitral stenosis/Mild to moderate Aortic stenosis - Outpatient f/u with cardiology  10. Pressure injury. POA Pressure Injury 10/14/23 Sacrum Mid Stage 2 -  Partial thickness loss of dermis presenting as a shallow open injury with a red, pink wound bed without slough. (Active)  10/14/23 2220  Location: Sacrum  Location Orientation: Mid  Staging: Stage 2 -  Partial thickness loss of dermis presenting as a shallow open injury with a red, pink wound bed without slough.  Wound Description (Comments):   Present on Admission: Yes       DVT prophylaxis: Lovenox Code Status: Full Family Communication: Updated patient and 2 sisters at bedside. Updated daughter on telephone. Disposition: TBD  Status is: Observation The patient remains OBS appropriate and will d/c before 2 midnights.   Consultants:  Neurology: Dr. Iver Nestle 10/14/2023  Procedures:  MRI brain 10/14/2023 2D echo 10/15/2023 CT angiogram head and neck 10/14/2023 CT head 10/14/2023  Antimicrobials:  Anti-infectives (From admission, onward)    Start     Dose/Rate Route Frequency Ordered Stop   10/15/23 0900  cefTRIAXone (ROCEPHIN) 2 g in  sodium chloride 0.9 % 100 mL IVPB        2 g 200 mL/hr over 30 Minutes Intravenous Every 24 hours  10/15/23 0810     10/14/23 2015  cefTRIAXone (ROCEPHIN) 1 g in sodium chloride 0.9 % 100 mL IVPB        1 g 200 mL/hr over 30 Minutes Intravenous  Once 10/14/23 2003 10/14/23 2124         Subjective: Patient laying in bed.  Sister is at bedside.  Patient denies any chest pain or shortness of breath.  No abdominal pain.  Feels she is back at baseline.  Patient states she was getting up to go to the bathroom and caretaker made her get up to try to walk and that is when she fell.  Patient states usually uses a wheelchair to go back and forth to the bathroom.   Objective: Vitals:   10/15/23 0356 10/15/23 0815 10/15/23 1111 10/15/23 1657  BP: (!) 140/55 (!) 140/58 (!) 129/54 (!) 131/53  Pulse: 74 74 70 74  Resp: 18  17   Temp: 98.9 F (37.2 C) 98.4 F (36.9 C) 98.6 F (37 C) 97.6 F (36.4 C)  TempSrc: Oral Oral Oral Oral  SpO2: 100% 98% 100% 100%  Weight:      Height:        Intake/Output Summary (Last 24 hours) at 10/15/2023 1853 Last data filed at 10/15/2023 1650 Gross per 24 hour  Intake 755.64 ml  Output --  Net 755.64 ml   Filed Weights   10/14/23 1842  Weight: 59.4 kg    Examination:  General exam: Appears calm and comfortable  Respiratory system: Clear to auscultation. Respiratory effort normal. Cardiovascular system: S1 & S2 heard, RRR. No JVD, murmurs, rubs, gallops or clicks. No pedal edema. Gastrointestinal system: Abdomen is nondistended, soft and nontender. No organomegaly or masses felt. Normal bowel sounds heard. Central nervous system: Alert and oriented.  Moving extremities spontaneously.  No focal neurological deficits. Extremities: 4-5/5 left upper extremity and left lower extremity strength.  5/5 right upper extremity and right lower extremity strength.  Skin: No rashes, lesions or ulcers Psychiatry: Judgement and insight appear normal. Mood & affect appropriate.     Data Reviewed: I have personally reviewed following labs and imaging  studies  CBC: Recent Labs  Lab 10/14/23 1915 10/14/23 1944 10/15/23 0704  WBC 7.9  --  7.0  NEUTROABS 5.2  --   --   HGB 10.4* 11.6* 9.1*  HCT 31.9* 34.0* 27.4*  MCV 85.1  --  83.5  PLT 189  --  191    Basic Metabolic Panel: Recent Labs  Lab 10/14/23 1915 10/14/23 1944 10/15/23 0704  NA 134* 135 138  K 4.6 4.5 4.2  CL 100 102 105  CO2 25  --  25  GLUCOSE 116* 111* 84  BUN 40* 42* 30*  CREATININE 1.01* 1.10* 0.92  CALCIUM 8.7*  --  8.8*    GFR: Estimated Creatinine Clearance (by C-G formula based on SCr of 0.92 mg/dL) Female: 81.1 mL/min Female: 49.5 mL/min  Liver Function Tests: Recent Labs  Lab 10/14/23 1915  AST 23  ALT 16  ALKPHOS 48  BILITOT 0.5  PROT 6.5  ALBUMIN 3.2*    CBG: Recent Labs  Lab 10/14/23 1831 10/15/23 0616 10/15/23 1112 10/15/23 1655  GLUCAP 123* 74 108* 102*     Recent Results (from the past 240 hours)  Urine Culture     Status: Abnormal  Collection Time: 10/14/23  9:22 PM   Specimen: Urine, Random  Result Value Ref Range Status   Specimen Description URINE, RANDOM  Final   Special Requests NONE Reflexed from [id number]  Final   Culture (A)  Final    <10,000 COLONIES/mL INSIGNIFICANT GROWTH Performed at [facility], [address] [place], [place] [address]    Report Status 10/15/2023 FINAL  Final         Radiology Studies: ECHOCARDIOGRAM COMPLETE Result Date: 10/15/2023    ECHOCARDIOGRAM REPORT   Patient Name:   WRENNA SAKS Date of Exam: 10/15/2023 Medical Rec #:  [id number]      Height:       62.0 in Accession #:    [id number]     Weight:       131.0 lb Date of Birth:  05-Mar-1941       BSA:          1.597 m Patient Age:    82 years       BP:           140/55 mmHg Patient Gender: F              HR:           82 bpm. Exam Location:  Inpatient Procedure: 2D Echo, Cardiac Doppler, Color Doppler and Intracardiac            Opacification Agent (Both Spectral and Color Flow Doppler were            utilized during  procedure). Indications:    Stroke  History:        Patient has prior history of Echocardiogram examinations, most                 recent 01/06/2020. Risk Factors:Hypertension, Diabetes and                 Dyslipidemia.  Sonographer:    Karma Ganja Referring Phys: [id number] VASUNDHRA RATHORE IMPRESSIONS  1. Left ventricular ejection fraction, by estimation, is 70 to 75%. The left ventricle has hyperdynamic function. The left ventricle has no regional wall motion abnormalities. Left ventricular diastolic function could not be evaluated. Elevated left atrial pressure.  2. Right ventricular systolic function is normal. The right ventricular size is normal.  3. Left atrial size was moderately dilated.  4. The mitral valve is degenerative. Trivial mitral valve regurgitation. Moderate mitral stenosis. The mean mitral valve gradient is 9.0 mmHg with average heart rate of 84 bpm. Severe mitral annular calcification.  5. The aortic valve is tricuspid. There is mild calcification of the aortic valve. There is moderate thickening of the aortic valve. Aortic valve regurgitation is not visualized. Mild to moderate aortic valve stenosis. Aortic valve mean gradient measures 13.0 mmHg. Aortic valve Vmax measures 2.38 m/s.  6. The inferior vena cava is normal in size with <50% respiratory variability, suggesting right atrial pressure of 8 mmHg. Comparison(s): Prior images reviewed side by side. Changes from prior study are noted. Both the aortic stenosis and mitral gradients have worsened. This appears to be due to true stenosis progression and not due to change in heart rate or cardiac output,  as both the aortic dimensionless index and the mitral pressure half time are worse. FINDINGS  Left Ventricle: Left ventricular ejection fraction, by estimation, is 70 to 75%. The left ventricle has hyperdynamic function. The left ventricle has no regional wall motion abnormalities. Definity contrast agent was given IV to delineate the left  ventricular endocardial borders. The left ventricular internal cavity size was normal in size. There is borderline concentric left ventricular hypertrophy. Left ventricular diastolic function could not be evaluated due to mitral stenosis. Left ventricular diastolic function could not be evaluated. Elevated left atrial pressure. Right Ventricle: The right ventricular size is normal. No increase in right ventricular wall thickness. Right ventricular systolic function is normal. Left Atrium: Left atrial size was moderately dilated. Right Atrium: Right atrial size was normal in size. Pericardium: There is no evidence of pericardial effusion. Mitral Valve: The mitral valve is degenerative in appearance. Severe mitral annular calcification. Trivial mitral valve regurgitation. Moderate mitral valve stenosis. MV peak gradient, 17.5 mmHg. The mean mitral valve gradient is 9.0 mmHg with average heart rate of 84 bpm. Tricuspid Valve: The tricuspid valve is normal in structure. Tricuspid valve regurgitation is not demonstrated. Aortic Valve: The aortic valve is tricuspid. There is mild calcification of the aortic valve. There is moderate thickening of the aortic valve. Aortic valve regurgitation is not visualized. Mild to moderate aortic stenosis is present. Aortic valve mean gradient measures 13.0 mmHg. Aortic valve peak gradient measures 22.7 mmHg. Aortic valve area, by VTI measures 1.35 cm. Pulmonic Valve: The pulmonic valve was normal in structure. Pulmonic valve regurgitation is mild. No evidence of pulmonic stenosis. Aorta: The aortic root is normal in size and structure. Venous: The inferior vena cava is normal in size with less than 50% respiratory variability, suggesting right atrial pressure of 8 mmHg. IAS/Shunts: There is right bowing of the interatrial septum, suggestive of elevated left atrial pressure. No atrial level shunt detected by color flow Doppler.  LEFT VENTRICLE PLAX 2D LVIDd:         3.90 cm    Diastology LVIDs:         2.50 cm   LV e' medial:    4.68 cm/s LV PW:         1.00 cm   LV E/e' medial:  38.7 LV IVS:        1.10 cm   LV e' lateral:   6.64 cm/s LVOT diam:     2.00 cm   LV E/e' lateral: 27.3 LV SV:         72 LV SV Index:   45 LVOT Area:     3.14 cm  RIGHT VENTRICLE             IVC RV Basal diam:  2.90 cm     IVC diam: 1.70 cm RV S prime:     11.50 cm/s TAPSE (M-mode): 2.3 cm LEFT ATRIUM             Index        RIGHT ATRIUM          Index LA diam:        4.00 cm 2.50 cm/m   RA Area:     9.96 cm LA Vol (A2C):   77.2 ml 48.35 ml/m  RA Volume:   16.00 ml 10.02 ml/m LA Vol (A4C):   46.6 ml 29.18 ml/m LA Biplane Vol: 60.9 ml 38.14 ml/m  AORTIC VALVE AV Area (Vmax):    1.39 cm AV Area (Vmean):   1.36 cm AV Area (VTI):     1.35 cm AV Vmax:           238.00 cm/s AV Vmean:          167.000 cm/s AV VTI:            0.529  m AV Peak Grad:      22.7 mmHg AV Mean Grad:      13.0 mmHg LVOT Vmax:         105.00 cm/s LVOT Vmean:        72.400 cm/s LVOT VTI:          0.228 m LVOT/AV VTI ratio: 0.43  AORTA Ao Root diam: 3.10 cm MITRAL VALVE MV Area (PHT): 2.22 cm     SHUNTS MV Area VTI:   1.18 cm     Systemic VTI:  0.23 m MV Peak grad:  17.5 mmHg    Systemic Diam: 2.00 cm MV Mean grad:  9.0 mmHg MV Vmax:       2.09 m/s MV Vmean:      147.0 cm/s MV Decel Time: 342 msec MV E velocity: 181.00 cm/s MV A velocity: 200.00 cm/s MV E/A ratio:  0.90 Mihai Croitoru MD Electronically signed by Thurmon Fair MD Signature Date/Time: 10/15/2023/5:34:25 PM    Final    MR BRAIN WO CONTRAST Result Date: 10/14/2023 CLINICAL DATA:  Neuro deficit, acute, stroke suspected EXAM: MRI HEAD WITHOUT CONTRAST TECHNIQUE: Multiplanar, multiecho pulse sequences of the brain and surrounding structures were obtained without intravenous contrast. COMPARISON:  CT head March 30, 25. FINDINGS: Brain: No acute infarction, hemorrhage, hydrocephalus, extra-axial collection or mass lesion. Extensive T2/FLAIR hyperintensities the white  matter, compatible chronic microvascular disease or. Remote brainstem mediated right cerebellar lacunar infarcts. Remote lacunar infarcts in bilateral corona radiata. Vascular: Major arterial flow voids are maintained at the skull base. Skull and upper cervical spine: Normal marrow signal. Sinuses/Orbits: Mild paranasal sinus disease. No acute orbital findings. Other: No mastoid effusions. IMPRESSION: 1. No evidence of acute intracranial abnormality. 2. Severe chronic microvascular ischemic disease. Electronically Signed   By: Feliberto Harts M.D.   On: 10/14/2023 21:51   CT ANGIO HEAD NECK W WO CM (CODE STROKE) Result Date: 10/14/2023 CLINICAL DATA:  Stroke/TIA, determine embolic source. Slurred speech and left-sided weakness. EXAM: CT ANGIOGRAPHY HEAD AND NECK WITH AND WITHOUT CONTRAST TECHNIQUE: Multidetector CT imaging of the head and neck was performed using the standard protocol during bolus administration of intravenous contrast. Multiplanar CT image reconstructions and MIPs were obtained to evaluate the vascular anatomy. Carotid stenosis measurements (when applicable) are obtained utilizing NASCET criteria, using the distal internal carotid diameter as the denominator. RADIATION DOSE REDUCTION: This exam was performed according to the departmental dose-optimization program which includes automated exposure control, adjustment of the mA and/or kV according to patient size and/or use of iterative reconstruction technique. CONTRAST:  75mL OMNIPAQUE IOHEXOL 350 MG/ML SOLN COMPARISON:  CTA head and neck 01/06/2020 FINDINGS: CTA NECK FINDINGS Aortic arch: Normal variant aortic arch branching pattern with common origin of the brachiocephalic and left common carotid arteries. Mild atherosclerosis without significant stenosis of the arch vessel origins. Right carotid system: Patent with predominantly calcified plaque at the carotid bifurcation resulting in 50% stenosis of the proximal ICA, progressed from prior.  Left carotid system: Patent with mixed calcified and soft plaque in the carotid bulb without evidence of a significant stenosis. Vertebral arteries: Patent with the right being dominant. No more than mild atherosclerotic narrowing of the right V1 and V2 segment. Diffusely small left vertebral artery with extensive atherosclerosis including multiple chronic severe V1 stenoses. Skeleton: Moderate cervical disc and facet degeneration. Other neck: No evidence of cervical lymphadenopathy or mass. Upper chest: Clear lung apices. Review of the MIP images confirms the above findings CTA HEAD FINDINGS Anterior circulation:  The internal carotid arteries are patent from skull base to carotid termini with atherosclerosis resulting in chronic, mild bilateral cavernous and severe right and mild left supraclinoid stenoses. ACAs and MCAs are patent without evidence of a proximal branch occlusion. A severe stenosis of the origin of the left M2 superior division is unchanged. No aneurysm is identified. Posterior circulation: The intracranial vertebral arteries are patent to the basilar with multiple severe left V4 stenoses again noted. The basilar artery is patent with a progressive, severe stenosis in its midportion. There patent posterior communicating arteries with hypoplastic or absent P1 segments bilaterally. There are chronic severe proximal and mid right P2 and bilateral distal P2 stenoses. No aneurysm is identified. Venous sinuses: Patent. Anatomic variants: Fetal origin of the PCAs. Review of the MIP images confirms the above findings Preliminary finding of no large vessel occlusion was communicated to Dr. Iver Nestle at 7:11 pm on 10/14/2023 by text page via the [facility] messaging system. IMPRESSION: 1. No large vessel occlusion. 2. Extensive intracranial atherosclerosis as detailed above, including a progressive, severe basilar artery stenosis. 3. 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 4.  Aortic Atherosclerosis  (ICD10-I70.0). Electronically Signed   By: Sebastian Ache M.D.   On: 10/14/2023 19:55   CT HEAD CODE STROKE WO CONTRAST Result Date: 10/14/2023 CLINICAL DATA:  Code stroke. Neuro deficit, acute, stroke suspected. Slurred speech and left-sided weakness. EXAM: CT HEAD WITHOUT CONTRAST TECHNIQUE: Contiguous axial images were obtained from the base of the skull through the vertex without intravenous contrast. RADIATION DOSE REDUCTION: This exam was performed according to the departmental dose-optimization program which includes automated exposure control, adjustment of the mA and/or kV according to patient size and/or use of iterative reconstruction technique. COMPARISON:  Head CT 01/11/2020 and MRI 01/06/2020 FINDINGS: Brain: There is no evidence of an acute infarct, intracranial hemorrhage, mass, midline shift, or extra-axial fluid collection. Confluent hypodensities in the cerebral white matter have progressed and are nonspecific but compatible with severe chronic small vessel ischemic disease. There are chronic lacunar infarcts in the cerebral white matter bilaterally as well as in the left basal ganglia, left thalamic capsular region, and right cerebellum. There is moderate cerebral atrophy. Vascular: Calcified atherosclerosis at the skull base. No hyperdense vessel. Skull: No acute fracture or suspicious lesion. Sinuses/Orbits: Fluid/secretions in the right sphenoid sinus. Mild bilateral ethmoid air cell opacification. Clear mastoid air cells. Bilateral cataract extraction. Other: None. ASPECTS ([place] Stroke Program Early CT Score) - Ganglionic level infarction (caudate, lentiform nuclei, internal capsule, insula, M1-M3 cortex): 7 - Supraganglionic infarction (M4-M6 cortex): 3 Total score (0-10 with 10 being normal): 10 These results were communicated to Dr. Iver Nestle at 6:50 pm on 10/14/2023 by text page via the [facility] messaging system. IMPRESSION: 1. No evidence of acute intracranial abnormality. ASPECTS of 10. 2.  Severe chronic small vessel ischemic disease. Electronically Signed   By: Sebastian Ache M.D.   On: 10/14/2023 18:50        Scheduled Meds:  amLODipine  5 mg Oral Daily   atorvastatin  80 mg Oral QHS   cholecalciferol  1,000 Units Oral Q lunch   clopidogrel  75 mg Oral Daily   enoxaparin (LOVENOX) injection  40 mg Subcutaneous Q24H   escitalopram  10 mg Oral q morning   levothyroxine  100 mcg Oral Daily   memantine  10 mg Oral BID   sodium chloride flush  3 mL Intravenous Once   Continuous Infusions:  sodium chloride 75 mL/hr at 10/15/23 1701   cefTRIAXone (  ROCEPHIN)  IV Stopped (10/15/23 1133)     LOS: 0 days    Time spent: 35 minutes    Ramiro Harvest, MD [facility]   To contact the attending provider between 7A-7P or the covering provider during after hours 7P-7A, please log into the web site [email or web address] and access using universal [place] password for that web site. If you do not have the password, please call the hospital operator.  10/15/2023, 6:53 PM

## 2023-10-15 NOTE — Plan of Care (Signed)
 Problem: Education: Goal: Knowledge of disease or condition will improve 10/15/2023 1842 by Wylie Hail, RN Outcome: Progressing 10/15/2023 1842 by Wylie Hail, RN Outcome: Progressing Goal: Knowledge of secondary prevention will improve (MUST DOCUMENT ALL) 10/15/2023 1842 by Wylie Hail, RN Outcome: Progressing 10/15/2023 1842 by Wylie Hail, RN Outcome: Progressing Goal: Knowledge of patient specific risk factors will improve (DELETE if not current risk factor) 10/15/2023 1842 by Wylie Hail, RN Outcome: Progressing 10/15/2023 1842 by Wylie Hail, RN Outcome: Progressing   Problem: Ischemic Stroke/TIA Tissue Perfusion: Goal: Complications of ischemic stroke/TIA will be minimized 10/15/2023 1842 by Wylie Hail, RN Outcome: Progressing 10/15/2023 1842 by Wylie Hail, RN Outcome: Progressing   Problem: Coping: Goal: Will verbalize positive feelings about self 10/15/2023 1842 by Wylie Hail, RN Outcome: Progressing 10/15/2023 1842 by Wylie Hail, RN Outcome: Progressing Goal: Will identify appropriate support needs 10/15/2023 1842 by Wylie Hail, RN Outcome: Progressing 10/15/2023 1842 by Wylie Hail, RN Outcome: Progressing   Problem: Health Behavior/Discharge Planning: Goal: Ability to manage health-related needs will improve 10/15/2023 1842 by Wylie Hail, RN Outcome: Progressing 10/15/2023 1842 by Wylie Hail, RN Outcome: Progressing Goal: Goals will be collaboratively established with patient/family 10/15/2023 1842 by Wylie Hail, RN Outcome: Progressing 10/15/2023 1842 by Wylie Hail, RN Outcome: Progressing   Problem: Self-Care: Goal: Ability to participate in self-care as condition permits will improve 10/15/2023 1842 by Wylie Hail, RN Outcome: Progressing 10/15/2023 1842 by Wylie Hail, RN Outcome: Progressing Goal: Verbalization of feelings and concerns over difficulty with self-care will improve 10/15/2023 1842 by  Wylie Hail, RN Outcome: Progressing 10/15/2023 1842 by Wylie Hail, RN Outcome: Progressing Goal: Ability to communicate needs accurately will improve 10/15/2023 1842 by Wylie Hail, RN Outcome: Progressing 10/15/2023 1842 by Wylie Hail, RN Outcome: Progressing   Problem: Nutrition: Goal: Risk of aspiration will decrease 10/15/2023 1842 by Wylie Hail, RN Outcome: Progressing 10/15/2023 1842 by Wylie Hail, RN Outcome: Progressing Goal: Dietary intake will improve 10/15/2023 1842 by Wylie Hail, RN Outcome: Progressing 10/15/2023 1842 by Wylie Hail, RN Outcome: Progressing   Problem: Education: Goal: Knowledge of General Education information will improve Description: Including pain rating scale, medication(s)/side effects and non-pharmacologic comfort measures 10/15/2023 1842 by Wylie Hail, RN Outcome: Progressing 10/15/2023 1842 by Wylie Hail, RN Outcome: Progressing   Problem: Health Behavior/Discharge Planning: Goal: Ability to manage health-related needs will improve 10/15/2023 1842 by Wylie Hail, RN Outcome: Progressing 10/15/2023 1842 by Wylie Hail, RN Outcome: Progressing   Problem: Clinical Measurements: Goal: Ability to maintain clinical measurements within normal limits will improve 10/15/2023 1842 by Wylie Hail, RN Outcome: Progressing 10/15/2023 1842 by Wylie Hail, RN Outcome: Progressing Goal: Will remain free from infection 10/15/2023 1842 by Wylie Hail, RN Outcome: Progressing 10/15/2023 1842 by Wylie Hail, RN Outcome: Progressing Goal: Diagnostic test results will improve 10/15/2023 1842 by Wylie Hail, RN Outcome: Progressing 10/15/2023 1842 by Wylie Hail, RN Outcome: Progressing Goal: Respiratory complications will improve 10/15/2023 1842 by Wylie Hail, RN Outcome: Progressing 10/15/2023 1842 by Wylie Hail, RN Outcome: Progressing Goal: Cardiovascular complication will be  avoided 10/15/2023 1842 by Wylie Hail, RN Outcome: Progressing 10/15/2023 1842 by Wylie Hail, RN Outcome: Progressing   Problem: Activity: Goal: Risk for activity intolerance will decrease 10/15/2023 1842 by Wylie Hail, RN Outcome: Progressing 10/15/2023 1842 by  Wylie Hail, RN Outcome: Not Progressing   Problem: Nutrition: Goal: Adequate nutrition will be maintained 10/15/2023 1842 by Wylie Hail, RN Outcome: Progressing 10/15/2023 1842 by Wylie Hail, RN Outcome: Progressing   Problem: Coping: Goal: Level of anxiety will decrease 10/15/2023 1842 by Wylie Hail, RN Outcome: Progressing 10/15/2023 1842 by Wylie Hail, RN Outcome: Progressing   Problem: Elimination: Goal: Will not experience complications related to bowel motility 10/15/2023 1842 by Wylie Hail, RN Outcome: Progressing 10/15/2023 1842 by Wylie Hail, RN Outcome: Progressing Goal: Will not experience complications related to urinary retention 10/15/2023 1842 by Wylie Hail, RN Outcome: Progressing 10/15/2023 1842 by Wylie Hail, RN Outcome: Progressing   Problem: Pain Managment: Goal: General experience of comfort will improve and/or be controlled 10/15/2023 1842 by Wylie Hail, RN Outcome: Progressing 10/15/2023 1842 by Wylie Hail, RN Outcome: Progressing   Problem: Safety: Goal: Ability to remain free from injury will improve 10/15/2023 1842 by Wylie Hail, RN Outcome: Progressing 10/15/2023 1842 by Wylie Hail, RN Outcome: Progressing   Problem: Skin Integrity: Goal: Risk for impaired skin integrity will decrease 10/15/2023 1842 by Wylie Hail, RN Outcome: Progressing 10/15/2023 1842 by Wylie Hail, RN Outcome: Progressing

## 2023-10-15 NOTE — TOC Initial Note (Signed)
 Transition of Care [facility]) - Initial/Assessment Note    Patient Details  Name: Jill Rose MRN: [id number] Date of Birth: 02-04-1941  Transition [facility]) CM/SW Contact:    Kermit Balo, RN Phone Number: 10/15/2023, 2:24 PM  Clinical Narrative:                  Pt is from  home with her spouse and a caregiver that is there from am until 8 pm.  Caregiver manges their medications with use of medical dispenser.  Son and caregiver provide needed transportation. Home health recommended. Pt has used [facility] in the past and asked to use them again. [facility] accepted and information on the AVS.  PCP: [facility]. TOC following.  Expected Discharge Plan: Home w Home Health Services Barriers to Discharge: Continued Medical Work up   Patient Goals and CMS Choice   CMS Medicare.gov Compare Post Acute Care list provided to:: Patient Choice offered to / list presented to : Patient      Expected Discharge Plan and Services   Discharge Planning Services: CM Consult Post Acute Care Choice: Home Health Living arrangements for the past 2 months: Single Family Home                           HH Arranged: PT, OT HH Agency: [facility] Date [facility] Agency Contacted: 10/15/23   Representative spoke with at [facility] Agency: Kandee Keen  Prior Living Arrangements/Services Living arrangements for the past 2 months: Single Family Home Lives with:: Spouse Patient language and need for interpreter reviewed:: Yes Do you feel safe going back to the place where you live?: Yes        Care giver support system in place?: Yes (comment) Current home services: DME (wheelchair/ walker/ shower seat/ ramp) Criminal Activity/Legal Involvement Pertinent to Current Situation/Hospitalization: No - Comment as needed  Activities of Daily Living   ADL Screening (condition at time of admission) Independently performs ADLs?: No Does the patient have a NEW difficulty with  bathing/dressing/toileting/self-feeding that is expected to last >3 days?: No Does the patient have a NEW difficulty with getting in/out of bed, walking, or climbing stairs that is expected to last >3 days?: No Does the patient have a NEW difficulty with communication that is expected to last >3 days?: No Is the patient deaf or have difficulty hearing?: No Does the patient have difficulty seeing, even when wearing glasses/contacts?: No Does the patient have difficulty concentrating, remembering, or making decisions?: No  Permission Sought/Granted                  Emotional Assessment Appearance:: Appears stated age Attitude/Demeanor/Rapport: Engaged Affect (typically observed): Accepting Orientation: : Oriented to Self, Oriented to Place, Oriented to Situation   Psych Involvement: No (comment)  Admission diagnosis:  TIA (transient ischemic attack) [G45.9] Patient Active Problem List   Diagnosis Date Noted   Normocytic anemia 10/15/2023   Asymptomatic bacteriuria 10/15/2023   Dementia (HCC) 10/15/2023   TIA (transient ischemic attack) 10/15/2023   Primary osteoarthritis of left knee 08/30/2020   Controlled type 2 diabetes mellitus with hyperglycemia, without long-term current use of insulin (HCC) 04/06/2020   Abnormality of gait 02/03/2020   Benign essential HTN    CKD (chronic kidney disease), stage II    Hemiparesis affecting left side as late effect of stroke (HCC)    Labile blood glucose    Labile blood pressure  Elevated BUN    Hyponatremia    Diabetic peripheral neuropathy (HCC)    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 Pharmacy:   [facility] [address] [place] [address] [place] [address] Phone: [phone] Fax: [phone]  [facility] [address] [place] [address] Phone: [phone] Fax: [phone]     Social Drivers of Health (SDOH) Social History: SDOH Screenings   Food Insecurity: No Food Insecurity (10/15/2023)  Housing: Low Risk  (10/15/2023)  Transportation Needs: No Transportation Needs (10/15/2023)  Utilities: Not At Risk (10/15/2023)  Depression (PHQ2-9): Low Risk  (12/14/2020)  Tobacco Use: Low Risk  (10/15/2023)   SDOH Interventions:     Readmission Risk Interventions     No data to display

## 2023-10-15 NOTE — Evaluation (Signed)
 Occupational Therapy Evaluation Patient Details Name: Jill Rose MRN: [id number] DOB: 08/15/1940 Today's Date: 10/15/2023   History of Present Illness   Jill Rose is a 83 y.o. female presenting 3/30 to ED as a code stroke for evaluation of increased L sided weakness from baseline, confusion, and slurred speech. CT head negative for acute intracranial abnormality. CTA head and neck negative for LVO. Brain MRI negative for acute intracranial abnormality. PMHx includes diabetes, HTN, HLD, hypothyroidism, mixed vascular dementia, stroke, TIA     Clinical Impressions Pt evaluated s/p the admission list above. At baseline, pt lives at home with husband and has PCAs 7 days a week to assist with ADLs/IADLs and functional mobility. Pt reports standing and pivoting but using wheelchair for functional mobility in the home. Upon evaluation, pt required MAX A for bed mobility, STS, and functional stand pivot transfers from bed to chair. Pt with difficulty advancing RLE during transfer. Based on evaluation, pt will require up to MAX A for ADLs. OT will continue following pt acutely to address functional needs with discharge recommendations of with [facility] OT with caregiver support to maximize functional independence and to ensure safe discharge to home environment.     If plan is discharge home, recommend the following:   A lot of help with walking and/or transfers;A lot of help with bathing/dressing/bathroom;Assistance with cooking/housework;Direct supervision/assist for medications management;Direct supervision/assist for financial management;Assist for transportation;Help with stairs or ramp for entrance;Supervision due to cognitive status     Functional Status Assessment   Patient has had a recent decline in their functional status and demonstrates the ability to make significant improvements in function in a reasonable and predictable amount of time.     Equipment Recommendations     (TBD)     Recommendations for Other Services         Precautions/Restrictions   Precautions Precautions: Fall Restrictions Weight Bearing Restrictions Per Provider Order: No     Mobility Bed Mobility Overal bed mobility: Needs Assistance Bed Mobility: Rolling, Sidelying to Sit Rolling: Mod assist, Used rails Sidelying to sit: Max assist, Used rails       General bed mobility comments: Pt required verbal cues for sequencing. Pt with good intiation bring BLE to EOB but required assistance facilitating trunk to upright position and scooting forward until B feet were flat on the floor.    Transfers Overall transfer level: Needs assistance Equipment used: None Transfers: Sit to/from Stand, Bed to chair/wheelchair/BSC Sit to Stand: Max assist Stand pivot transfers: Max assist         General transfer comment: Pt with BUE/BLE weakness. Pt with difficulty advancing RLE during functional transfer.      Balance Overall balance assessment: Needs assistance Sitting-balance support: Bilateral upper extremity supported, Feet supported Sitting balance-Leahy Scale: Poor Sitting balance - Comments: static sitting EOB   Standing balance support: Bilateral upper extremity supported Standing balance-Leahy Scale: Zero Standing balance comment: Pt requires MAX A to maintain balance while standing                           ADL either performed or assessed with clinical judgement   ADL Overall ADL's : Needs assistance/impaired Eating/Feeding: Set up;Sitting   Grooming: Set up;Sitting   Upper Body Bathing: Minimal assistance;Sitting   Lower Body Bathing: Maximal assistance;Sitting/lateral leans;Sit to/from stand   Upper Body Dressing : Minimal assistance;Sitting   Lower Body Dressing: Maximal assistance;Sitting/lateral leans;Sit to/from stand   Toilet Transfer:  Maximal assistance;Stand-pivot;Regular Toilet   Toileting- Clothing Manipulation and Hygiene:  Sitting/lateral lean;Sit to/from stand;Maximal assistance       Functional mobility during ADLs: Maximal assistance;Cueing for sequencing General ADL Comments: Pt with weakness in BUE/BLE.     Vision Baseline Vision/History: 1 Wears glasses Ability to See in Adequate Light: 0 Adequate Patient Visual Report: No change from baseline Vision Assessment?: No apparent visual deficits     Perception Perception: Not tested       Praxis Praxis: Not tested       Pertinent Vitals/Pain Pain Assessment Pain Assessment: No/denies pain     Extremity/Trunk Assessment Upper Extremity Assessment Upper Extremity Assessment: Defer to OT evaluation;Generalized weakness   Lower Extremity Assessment Lower Extremity Assessment: LLE deficits/detail;RLE deficits/detail;Generalized weakness   Cervical / Trunk Assessment Cervical / Trunk Assessment: Normal   Communication Communication Communication: Impaired Factors Affecting Communication: Reduced clarity of speech   Cognition Arousal: Alert Behavior During Therapy: WFL for tasks assessed/performed Cognition: History of cognitive impairments, No family/caregiver present to determine baseline             OT - Cognition Comments: Pt oriented to self, place.                 Following commands: Intact       Cueing  General Comments   Cueing Techniques: Verbal cues;Tactile cues  VSS on RA   Exercises     Shoulder Instructions      Home Living Family/patient expects to be discharged to:: Private residence Living Arrangements: Spouse/significant other Available Help at Discharge: Personal care attendant;Family Type of Home: House             Bathroom Shower/Tub: Walk-in Human resources officer: Standard Bathroom Accessibility: Yes How Accessible: Accessible via wheelchair Home Equipment: Wheelchair - manual;Grab bars - toilet;Grab bars - tub/shower;Shower seat   Additional Comments: Pt has PCA everyday from  8 AM-8PM that assists with all ADLs/IADLs. pt reports they really come 9-7 typically. She stays in bed until they arrive in the morning and they put her to bed after. Pt reports the PCA that provides assistance on Sunday and Monday is not able to provide heavy physical assist  Lives With: Spouse    Prior Functioning/Environment Prior Level of Function : Needs assist  Cognitive Assist : ADLs (cognitive)   ADLs (Cognitive): Intermittent cues Physical Assist : Mobility (physical);ADLs (physical) Mobility (physical): Bed mobility;Transfers ADLs (physical): Bathing;Dressing;Toileting;IADLs Mobility Comments: Pt reports being able to stand to pivot to bed, wheelchair, commode but uses a w/c for mobility at baseline ADLs Comments: Pt reports PCA assists with bathing dressing, medication management, meals, etc. but she was able to feed herself and complete grooming task    OT Problem List: Decreased strength;Decreased range of motion;Decreased activity tolerance;Impaired balance (sitting and/or standing);Decreased cognition;Decreased knowledge of use of DME or AE;Decreased coordination   OT Treatment/Interventions: Therapeutic exercise;Self-care/ADL training;Energy conservation;DME and/or AE instruction;Therapeutic activities;Cognitive remediation/compensation;Patient/family education;Balance training      OT Goals(Current goals can be found in the care plan section)   Acute Rehab OT Goals Patient Stated Goal: none stated OT Goal Formulation: With patient Time For Goal Achievement: 10/29/23 Potential to Achieve Goals: Good ADL Goals Pt Will Perform Upper Body Dressing: with set-up;sitting Pt Will Perform Lower Body Dressing: with mod assist;sit to/from stand;sitting/lateral leans Pt Will Transfer to Toilet: with min assist;stand pivot transfer;bedside commode Pt Will Perform Toileting - Clothing Manipulation and hygiene: with mod assist;sitting/lateral leans;sit to/from stand Pt/caregiver  will Perform  Home Exercise Program: Both right and left upper extremity;Increased strength;With theraputty;With written HEP provided;Increased ROM;With minimal assist Additional ADL Goal #1: Pt will complete bed mobility with MIN A as a precursor to OOB ADLs   OT Frequency:  Min 2X/week    Co-evaluation              AM-PAC OT "6 Clicks" Daily Activity     Outcome Measure Help from another person eating meals?: A Little Help from another person taking care of personal grooming?: A Little Help from another person toileting, which includes using toliet, bedpan, or urinal?: A Lot Help from another person bathing (including washing, rinsing, drying)?: A Lot Help from another person to put on and taking off regular upper body clothing?: A Little Help from another person to put on and taking off regular lower body clothing?: A Lot 6 Click Score: 15   End of Session Equipment Utilized During Treatment: Gait belt Nurse Communication: Mobility status  Activity Tolerance: Patient tolerated treatment well Patient left: in chair;with call bell/phone within reach;with chair alarm set  OT Visit Diagnosis: Unsteadiness on feet (R26.81);Other abnormalities of gait and mobility (R26.89);Muscle weakness (generalized) (M62.81)                Time: 8295-6213 OT Time Calculation (min): 36 min Charges:  OT General Charges $OT Visit: 1 Visit OT Evaluation $OT Eval Moderate Complexity: 1 Mod OT Treatments $Self Care/Home Management : 8-22 mins  [address] MOTS  Xaria Judon 10/15/2023, 1:52 PM

## 2023-10-16 DIAGNOSIS — R8271 Bacteriuria: Secondary | ICD-10-CM | POA: Diagnosis not present

## 2023-10-16 DIAGNOSIS — D649 Anemia, unspecified: Secondary | ICD-10-CM

## 2023-10-16 DIAGNOSIS — G459 Transient cerebral ischemic attack, unspecified: Secondary | ICD-10-CM | POA: Diagnosis not present

## 2023-10-16 DIAGNOSIS — N3000 Acute cystitis without hematuria: Secondary | ICD-10-CM

## 2023-10-16 DIAGNOSIS — E785 Hyperlipidemia, unspecified: Secondary | ICD-10-CM | POA: Diagnosis not present

## 2023-10-16 DIAGNOSIS — E871 Hypo-osmolality and hyponatremia: Secondary | ICD-10-CM | POA: Diagnosis not present

## 2023-10-16 LAB — BASIC METABOLIC PANEL WITH GFR
Anion gap: 13 (ref 5–15)
BUN: 27 mg/dL — ABNORMAL HIGH (ref 8–23)
CO2: 21 mmol/L — ABNORMAL LOW (ref 22–32)
Calcium: 9 mg/dL (ref 8.9–10.3)
Chloride: 105 mmol/L (ref 98–111)
Creatinine, Ser: 0.88 mg/dL (ref 0.44–1.00)
GFR, Estimated: >60 mL/min (ref >60)
Glucose, Bld: 75 mg/dL (ref 70–99)
Potassium: 4.4 mmol/L (ref 3.5–5.1)
Sodium: 139 mmol/L (ref 135–145)

## 2023-10-16 LAB — CBC
HCT: 30.8 % — ABNORMAL LOW (ref 36.0–46.0)
Hemoglobin: 10 g/dL — ABNORMAL LOW (ref 12.0–15.0)
MCH: 27.5 pg (ref 26.0–34.0)
MCHC: 32.5 g/dL (ref 30.0–36.0)
MCV: 84.8 fL (ref 80.0–100.0)
Platelets: 184 10*3/uL (ref 150–400)
RBC: 3.63 MIL/uL — ABNORMAL LOW (ref 3.87–5.11)
RDW: 15.2 % (ref 11.5–15.5)
WBC: 7.7 10*3/uL (ref 4.0–10.5)
nRBC: 0 % (ref 0.0–0.2)

## 2023-10-16 LAB — GLUCOSE, CAPILLARY
Glucose-Capillary: 72 mg/dL (ref 70–99)
Glucose-Capillary: 148 mg/dL — ABNORMAL HIGH (ref 70–99)

## 2023-10-16 MED ORDER — CEFADROXIL 500 MG PO CAPS: 500.0000 mg | ORAL_CAPSULE | Freq: Two times a day (BID) | ORAL | Status: DC

## 2023-10-16 MED ORDER — CEFADROXIL 500 MG PO CAPS: 500.0000 mg | ORAL_CAPSULE | Freq: Two times a day (BID) | ORAL | 0 refills | Status: AC

## 2023-10-16 MED ORDER — ASPIRIN 81 MG PO TBEC: 81.0000 mg | DELAYED_RELEASE_TABLET | Freq: Every day | ORAL | Status: DC
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
## 2023-10-16 NOTE — Discharge Summary (Signed)
 Physician Discharge Summary  Jill Rose:[id number] DOB: [birth year] DOA: 81/30/2025  PCP: No primary care provider on file.  Admit date: 10/14/2023 Discharge date: 10/16/2023  Time spent: 60 minutes  Recommendations for Outpatient Follow-up:  Follow-up with PCP in 2 weeks.  On follow-up patient will need a basic metabolic profile done to follow-up on electrolytes and renal function.  Patient will likely benefit from referral to cardiology for follow-up on moderate mitral stenosis and mild aortic valvular stenosis noted on 2D echo. Follow-up with [facility] neurology in 1 month.   Discharge Diagnoses:  Principal Problem:   TIA (transient ischemic attack) Active Problems:   Hypothyroidism   Diet-controlled diabetes mellitus (HCC)   Hyponatremia   Normocytic anemia   Asymptomatic bacteriuria   Dementia (HCC)   Urinary tract infection without hematuria   Hyperlipidemia   Pressure injury of skin   Discharge Condition: Stable and improved.  Diet recommendation: Heart healthy.  Filed Weights   10/14/23 1842  Weight: 59.4 kg    History of present illness:  HPI per Dr. Carlyon Rose is a 83 y.o. 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  Out of window for IV thrombolytics at the time of arrival.  CT head negative for acute intracranial abnormality.  CTA head and neck negative for LVO.  Brain MRI negative for acute intracranial abnormality. Labs notable for hemoglobin 10.4 (at baseline), MCV 85.1, sodium 134, glucose 116, BUN 40, creatinine 1.0, ethanol level <10.  UA with negative nitrite, large amount of leukocytes, and microscopy showing >50 WBCs and rare bacteria.  Urine culture pending.  Neurology consulted and [facility] called to  admit.   Patient denies experiencing any left-sided weakness.  She tells me that she had 2 previous strokes but did not have any residual deficits although also telling me that at baseline she is not able to walk and has to use a wheelchair to ambulate at home.  She tells me that her husband called EMS and she is not sure why.  She is reporting mild right upper extremity weakness/difficulty writing with her right hand which has been going on for several months to a year.  She has no other complaints.  Denies fevers, chills, cough, shortness breath, chest pain, nausea, vomiting, abdominal pain, diarrhea, dysuria, or urinary frequency/urgency.   Hospital Course:  #1 TIA/stroke workup/left-sided weakness/versus recrudescence of old CVA in the setting of UTI -Patient presented transient worsening of left-sided weakness with improvement with symptoms rapidly. -Concern for CVA versus TIA. -CT head done negative for any acute abnormalities. -MRI brain negative for any acute abnormalities. -CT angiogram head and neck done with no LVO, extensive intracranial atherosclerosis including a progressive, severe basilar artery stenosis.  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  Aortic atherosclerosis. -Hemoglobin A1c noted at 5.3. -Fasting lipid panel with LDL of 54. -2D echo done with EF of 70 to 75%,NWMA, moderate mitral stenosis, mild to moderate aortic valve stenosis, no atrial level shunt detected by color-flow Doppler. -PT/OT. -Patient maintained on home regimen Plavix, statin. -Urinalysis concerning for UTI. -Urine cultures negative. -Patient received 2 days of IV Rocephin during the hospitalization be discharged on 1 more day of Duricef to complete a course of antibiotic treatment. -Patient seen by neurology during the hospitalization who recommended outpatient follow-up. -Patient will be discharged  in stable and improved condition.   2.  UTI -Urine cultures negative.   -Patient received 2 days  of IV Rocephin during the hospitalization and will be discharged home on 1 more day of Duricef to complete a 3-day course of antibiotic treatment.    3.  Hyperlipidemia -LDL of 54. -Maintained on statin.   4.  Hypothyroidism -Patient maintained on home regimen Synthroid.   5.  Dementia -Stable. -Patient maintained on home regimen Namenda.   6.  Hypertension -Patient maintained on home regimen Norvasc 5 mg daily.   7.  Diet-controlled type 2 diabetes mellitus -Hemoglobin A1c 5.3. -Outpatient follow-up.   8.  Mild hyponatremia -Improved with hydration.   9. Moderate Mitral stenosis/Mild to moderate Aortic stenosis - Outpatient f/u with cardiology   10. Pressure injury. POA Pressure Injury 10/14/23 Sacrum Mid Stage 2 -  Partial thickness loss of dermis presenting as a shallow open injury with a red, pink wound bed without slough. (Active)  10/14/23 2220  Location: Sacrum  Location Orientation: Mid  Staging: Stage 2 -  Partial thickness loss of dermis presenting as a shallow open injury with a red, pink wound bed without slough.  Wound Description (Comments):   Present on Admission: Yes     Procedures: MRI brain 10/14/2023 2D echo 10/15/2023 CT angiogram head and neck 10/14/2023 CT head 10/14/2023  Consultations: Neurology: Dr. Iver Nestle 10/14/2023    Discharge Exam: Vitals:   10/16/23 0758 10/16/23 1147  BP: (!) 147/56 (!) 141/54  Pulse: 70 69  Resp: 17 17  Temp: 98.4 F (36.9 C) 98 F (36.7 C)  SpO2: 99% 99%    General: NAD Cardiovascular: RRR no murmurs rubs or gallops.  No JVD.  No lower extremity edema. Respiratory: Clear to auscultation bilaterally.  No wheezes, no crackles, no rhonchi.  Fair air movement.  Speaking in full sentences.  Discharge Instructions   Discharge Instructions     Ambulatory referral to Neurology   Complete by: As directed    An appointment is requested in approximately: 4 weeks   Diet - low sodium heart healthy   Complete by: As  directed    Discharge wound care:   Complete by: As directed    Pressure Injury 10/14/23 Sacrum Mid Stage 2 -  Partial thickness loss of dermis presenting as a shallow open injury with a red, pink wound bed without slough.   Increase activity slowly   Complete by: As directed       Allergies as of 10/16/2023       Reactions   Ammonia Shortness Of Breath, Itching, Swelling, Other (See Comments)   Confusion, Headache    Peroxide [hydrogen Peroxide] Shortness Of Breath, Itching, Swelling, Other (See Comments)   Confusion, Headache         Medication List     TAKE these medications    acetaminophen 325 MG tablet Commonly known as: TYLENOL Take 2 tablets (650 mg total) by mouth every 4 (four) hours as needed for mild pain (or temp > 37.5 C (99.5 F)).   amLODipine 5 MG tablet Commonly known as: NORVASC Take 5 mg by mouth daily.   atorvastatin 80 MG tablet Commonly known as: LIPITOR Take 1 tablet (80 mg total) by mouth daily.   cefadroxil 500 MG capsule Commonly known as: DURICEF Take 1 capsule (500 mg total) by mouth 2 (two) times daily for 1 day. Start taking on: October 17, 2023   clopidogrel 75 MG tablet Commonly known as: PLAVIX Take 1  tablet (75 mg total) by mouth daily.   diclofenac Sodium 1 % Gel Commonly known as: VOLTAREN APPLY 2 GRAMS TO AFFECTED AREA 4 TIMES A DAY   escitalopram 10 MG tablet Commonly known as: LEXAPRO Take 10 mg by mouth every morning.   hydrALAZINE 10 MG tablet Commonly known as: APRESOLINE Take 1 tablet (10 mg total) by mouth at bedtime. What changed: when to take this   levothyroxine 100 MCG tablet Commonly known as: SYNTHROID Take 100 mcg by mouth daily.   Magnesium 250 MG Tabs Take 2 tablets by mouth daily. Sugar free gummy   Melatonin 5 MG Chew Chew 1 tablet by mouth daily. Sugar free gummy   memantine 10 MG tablet Commonly known as: NAMENDA Take 1 tablet (10 mg total) by mouth 2 (two) times daily.   multivitamin with  minerals Tabs tablet Take 1 tablet by mouth daily.   silver sulfADIAZINE 1 % cream Commonly known as: SILVADENE Apply 1 Application topically as needed (wound care).   telmisartan 80 MG tablet Commonly known as: MICARDIS Take 80 mg by mouth daily.   VITAMIN D3 PO Take 1 tablet by mouth daily with lunch.               Discharge Care Instructions  (From admission, onward)           Start     Ordered   10/16/23 0000  Discharge wound care:       Comments: Pressure Injury 10/14/23 Sacrum Mid Stage 2 -  Partial thickness loss of dermis presenting as a shallow open injury with a red, pink wound bed without slough.   10/16/23 1432           Allergies  Allergen Reactions   Ammonia Shortness Of Breath, Itching, Swelling and Other (See Comments)    Confusion, Headache     Peroxide [Hydrogen Peroxide] Shortness Of Breath, Itching, Swelling and Other (See Comments)    Confusion, Headache      Follow-up Information     Care, [facility] Follow up.   Specialty: Home Health Services Why: Frances Furbish will contact you for the first home visit. Contact information: [address] [place] [address] [phone]         pcp. Schedule an appointment as soon as possible for a visit in 2 week(s).          [facility] Follow up in 1 month(s).   Specialty: Neurology Contact information: [address] [place] [address] [phone]                 The results of significant diagnostics from this hospitalization (including imaging, microbiology, ancillary and laboratory) are listed below for reference.    Significant Diagnostic Studies: ECHOCARDIOGRAM COMPLETE Result Date: 10/15/2023    ECHOCARDIOGRAM REPORT   Patient Name:   Jill Rose Date of Exam: 10/15/2023 Medical Rec #:  [id number]      Height:       62.0 in Accession #:    [id number]     Weight:       131.0 lb Date of Birth:   May 03, 1941       BSA:          1.597 m Patient Age:    82 years       BP:           140/55 mmHg Patient Gender: F  HR:           82 bpm. Exam Location:  Inpatient Procedure: 2D Echo, Cardiac Doppler, Color Doppler and Intracardiac            Opacification Agent (Both Spectral and Color Flow Doppler were            utilized during procedure). Indications:    Stroke  History:        Patient has prior history of Echocardiogram examinations, most                 recent 01/06/2020. Risk Factors:Hypertension, Diabetes and                 Dyslipidemia.  Sonographer:    Karma Ganja Referring Phys: [id number] VASUNDHRA RATHORE IMPRESSIONS  1. Left ventricular ejection fraction, by estimation, is 70 to 75%. The left ventricle has hyperdynamic function. The left ventricle has no regional wall motion abnormalities. Left ventricular diastolic function could not be evaluated. Elevated left atrial pressure.  2. Right ventricular systolic function is normal. The right ventricular size is normal.  3. Left atrial size was moderately dilated.  4. The mitral valve is degenerative. Trivial mitral valve regurgitation. Moderate mitral stenosis. The mean mitral valve gradient is 9.0 mmHg with average heart rate of 84 bpm. Severe mitral annular calcification.  5. The aortic valve is tricuspid. There is mild calcification of the aortic valve. There is moderate thickening of the aortic valve. Aortic valve regurgitation is not visualized. Mild to moderate aortic valve stenosis. Aortic valve mean gradient measures 13.0 mmHg. Aortic valve Vmax measures 2.38 m/s.  6. The inferior vena cava is normal in size with <50% respiratory variability, suggesting right atrial pressure of 8 mmHg. Comparison(s): Prior images reviewed side by side. Changes from prior study are noted. Both the aortic stenosis and mitral gradients have worsened. This appears to be due to true stenosis progression and not due to change in heart rate or cardiac output,  as  both the aortic dimensionless index and the mitral pressure half time are worse. FINDINGS  Left Ventricle: Left ventricular ejection fraction, by estimation, is 70 to 75%. The left ventricle has hyperdynamic function. The left ventricle has no regional wall motion abnormalities. Definity contrast agent was given IV to delineate the left ventricular endocardial borders. The left ventricular internal cavity size was normal in size. There is borderline concentric left ventricular hypertrophy. Left ventricular diastolic function could not be evaluated due to mitral stenosis. Left ventricular diastolic function could not be evaluated. Elevated left atrial pressure. Right Ventricle: The right ventricular size is normal. No increase in right ventricular wall thickness. Right ventricular systolic function is normal. Left Atrium: Left atrial size was moderately dilated. Right Atrium: Right atrial size was normal in size. Pericardium: There is no evidence of pericardial effusion. Mitral Valve: The mitral valve is degenerative in appearance. Severe mitral annular calcification. Trivial mitral valve regurgitation. Moderate mitral valve stenosis. MV peak gradient, 17.5 mmHg. The mean mitral valve gradient is 9.0 mmHg with average heart rate of 84 bpm. Tricuspid Valve: The tricuspid valve is normal in structure. Tricuspid valve regurgitation is not demonstrated. Aortic Valve: The aortic valve is tricuspid. There is mild calcification of the aortic valve. There is moderate thickening of the aortic valve. Aortic valve regurgitation is not visualized. Mild to moderate aortic stenosis is present. Aortic valve mean gradient measures 13.0 mmHg. Aortic valve peak gradient measures 22.7 mmHg. Aortic valve area, by VTI measures 1.35 cm. Pulmonic Valve:  The pulmonic valve was normal in structure. Pulmonic valve regurgitation is mild. No evidence of pulmonic stenosis. Aorta: The aortic root is normal in size and structure. Venous: The  inferior vena cava is normal in size with less than 50% respiratory variability, suggesting right atrial pressure of 8 mmHg. IAS/Shunts: There is right bowing of the interatrial septum, suggestive of elevated left atrial pressure. No atrial level shunt detected by color flow Doppler.  LEFT VENTRICLE PLAX 2D LVIDd:         3.90 cm   Diastology LVIDs:         2.50 cm   LV e' medial:    4.68 cm/s LV PW:         1.00 cm   LV E/e' medial:  38.7 LV IVS:        1.10 cm   LV e' lateral:   6.64 cm/s LVOT diam:     2.00 cm   LV E/e' lateral: 27.3 LV SV:         72 LV SV Index:   45 LVOT Area:     3.14 cm  RIGHT VENTRICLE             IVC RV Basal diam:  2.90 cm     IVC diam: 1.70 cm RV S prime:     11.50 cm/s TAPSE (M-mode): 2.3 cm LEFT ATRIUM             Index        RIGHT ATRIUM          Index LA diam:        4.00 cm 2.50 cm/m   RA Area:     9.96 cm LA Vol (A2C):   77.2 ml 48.35 ml/m  RA Volume:   16.00 ml 10.02 ml/m LA Vol (A4C):   46.6 ml 29.18 ml/m LA Biplane Vol: 60.9 ml 38.14 ml/m  AORTIC VALVE AV Area (Vmax):    1.39 cm AV Area (Vmean):   1.36 cm AV Area (VTI):     1.35 cm AV Vmax:           238.00 cm/s AV Vmean:          167.000 cm/s AV VTI:            0.529 m AV Peak Grad:      22.7 mmHg AV Mean Grad:      13.0 mmHg LVOT Vmax:         105.00 cm/s LVOT Vmean:        72.400 cm/s LVOT VTI:          0.228 m LVOT/AV VTI ratio: 0.43  AORTA Ao Root diam: 3.10 cm MITRAL VALVE MV Area (PHT): 2.22 cm     SHUNTS MV Area VTI:   1.18 cm     Systemic VTI:  0.23 m MV Peak grad:  17.5 mmHg    Systemic Diam: 2.00 cm MV Mean grad:  9.0 mmHg MV Vmax:       2.09 m/s MV Vmean:      147.0 cm/s MV Decel Time: 342 msec MV E velocity: 181.00 cm/s MV A velocity: 200.00 cm/s MV E/A ratio:  0.90 Mihai Croitoru MD Electronically signed by Thurmon Fair MD Signature Date/Time: 10/15/2023/5:34:25 PM    Final    MR BRAIN WO CONTRAST Result Date: 10/14/2023 CLINICAL DATA:  Neuro deficit, acute, stroke suspected EXAM: MRI HEAD WITHOUT  CONTRAST TECHNIQUE: Multiplanar, multiecho pulse sequences of the brain and surrounding structures  were obtained without intravenous contrast. COMPARISON:  CT head March 30, 25. FINDINGS: Brain: No acute infarction, hemorrhage, hydrocephalus, extra-axial collection or mass lesion. Extensive T2/FLAIR hyperintensities the white matter, compatible chronic microvascular disease or. Remote brainstem mediated right cerebellar lacunar infarcts. Remote lacunar infarcts in bilateral corona radiata. Vascular: Major arterial flow voids are maintained at the skull base. Skull and upper cervical spine: Normal marrow signal. Sinuses/Orbits: Mild paranasal sinus disease. No acute orbital findings. Other: No mastoid effusions. IMPRESSION: 1. No evidence of acute intracranial abnormality. 2. Severe chronic microvascular ischemic disease. Electronically Signed   By: Feliberto Harts M.D.   On: 10/14/2023 21:51   CT ANGIO HEAD NECK W WO CM (CODE STROKE) Result Date: 10/14/2023 CLINICAL DATA:  Stroke/TIA, determine embolic source. Slurred speech and left-sided weakness. EXAM: CT ANGIOGRAPHY HEAD AND NECK WITH AND WITHOUT CONTRAST TECHNIQUE: Multidetector CT imaging of the head and neck was performed using the standard protocol during bolus administration of intravenous contrast. Multiplanar CT image reconstructions and MIPs were obtained to evaluate the vascular anatomy. Carotid stenosis measurements (when applicable) are obtained utilizing NASCET criteria, using the distal internal carotid diameter as the denominator. RADIATION DOSE REDUCTION: This exam was performed according to the departmental dose-optimization program which includes automated exposure control, adjustment of the mA and/or kV according to patient size and/or use of iterative reconstruction technique. CONTRAST:  75mL OMNIPAQUE IOHEXOL 350 MG/ML SOLN COMPARISON:  CTA head and neck 01/06/2020 FINDINGS: CTA NECK FINDINGS Aortic arch: Normal variant aortic arch  branching pattern with common origin of the brachiocephalic and left common carotid arteries. Mild atherosclerosis without significant stenosis of the arch vessel origins. Right carotid system: Patent with predominantly calcified plaque at the carotid bifurcation resulting in 50% stenosis of the proximal ICA, progressed from prior. Left carotid system: Patent with mixed calcified and soft plaque in the carotid bulb without evidence of a significant stenosis. Vertebral arteries: Patent with the right being dominant. No more than mild atherosclerotic narrowing of the right V1 and V2 segment. Diffusely small left vertebral artery with extensive atherosclerosis including multiple chronic severe V1 stenoses. Skeleton: Moderate cervical disc and facet degeneration. Other neck: No evidence of cervical lymphadenopathy or mass. Upper chest: Clear lung apices. Review of the MIP images confirms the above findings CTA HEAD FINDINGS Anterior circulation: The internal carotid arteries are patent from skull base to carotid termini with atherosclerosis resulting in chronic, mild bilateral cavernous and severe right and mild left supraclinoid stenoses. ACAs and MCAs are patent without evidence of a proximal branch occlusion. A severe stenosis of the origin of the left M2 superior division is unchanged. No aneurysm is identified. Posterior circulation: The intracranial vertebral arteries are patent to the basilar with multiple severe left V4 stenoses again noted. The basilar artery is patent with a progressive, severe stenosis in its midportion. There patent posterior communicating arteries with hypoplastic or absent P1 segments bilaterally. There are chronic severe proximal and mid right P2 and bilateral distal P2 stenoses. No aneurysm is identified. Venous sinuses: Patent. Anatomic variants: Fetal origin of the PCAs. Review of the MIP images confirms the above findings Preliminary finding of no large vessel occlusion was  communicated to Dr. Iver Nestle at 7:11 pm on 10/14/2023 by text page via the [facility] messaging system. IMPRESSION: 1. No large vessel occlusion. 2. Extensive intracranial atherosclerosis as detailed above, including a progressive, severe basilar artery stenosis. 3. 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 4.  Aortic Atherosclerosis (ICD10-I70.0). Electronically Signed   By: Jolaine Click.D.  On: 10/14/2023 19:55   CT HEAD CODE STROKE WO CONTRAST Result Date: 10/14/2023 CLINICAL DATA:  Code stroke. Neuro deficit, acute, stroke suspected. Slurred speech and left-sided weakness. EXAM: CT HEAD WITHOUT CONTRAST TECHNIQUE: Contiguous axial images were obtained from the base of the skull through the vertex without intravenous contrast. RADIATION DOSE REDUCTION: This exam was performed according to the departmental dose-optimization program which includes automated exposure control, adjustment of the mA and/or kV according to patient size and/or use of iterative reconstruction technique. COMPARISON:  Head CT 01/11/2020 and MRI 01/06/2020 FINDINGS: Brain: There is no evidence of an acute infarct, intracranial hemorrhage, mass, midline shift, or extra-axial fluid collection. Confluent hypodensities in the cerebral white matter have progressed and are nonspecific but compatible with severe chronic small vessel ischemic disease. There are chronic lacunar infarcts in the cerebral white matter bilaterally as well as in the left basal ganglia, left thalamic capsular region, and right cerebellum. There is moderate cerebral atrophy. Vascular: Calcified atherosclerosis at the skull base. No hyperdense vessel. Skull: No acute fracture or suspicious lesion. Sinuses/Orbits: Fluid/secretions in the right sphenoid sinus. Mild bilateral ethmoid air cell opacification. Clear mastoid air cells. Bilateral cataract extraction. Other: None. ASPECTS (Alberta Stroke Program Early CT Score) - Ganglionic level infarction (caudate, lentiform  nuclei, internal capsule, insula, M1-M3 cortex): 7 - Supraganglionic infarction (M4-M6 cortex): 3 Total score (0-10 with 10 being normal): 10 These results were communicated to Dr. Iver Nestle at 6:50 pm on 10/14/2023 by text page via the [facility] messaging system. IMPRESSION: 1. No evidence of acute intracranial abnormality. ASPECTS of 10. 2. Severe chronic small vessel ischemic disease. Electronically Signed   By: Sebastian Ache M.D.   On: 10/14/2023 18:50    Microbiology: Recent Results (from the past 240 hours)  Urine Culture     Status: Abnormal   Collection Time: 10/14/23  9:22 PM   Specimen: Urine, Random  Result Value Ref Range Status   Specimen Description URINE, RANDOM  Final   Special Requests NONE Reflexed from [id number]  Final   Culture (A)  Final    <10,000 COLONIES/mL INSIGNIFICANT GROWTH Performed at [facility], [address], [place], [place] [address]    Report Status 10/15/2023 FINAL  Final     Labs: Basic Metabolic Panel: Recent Labs  Lab 10/14/23 1915 10/14/23 1944 10/15/23 0704 10/16/23 0546  NA 134* 135 138 139  K 4.6 4.5 4.2 4.4  CL 100 102 105 105  CO2 25  --  25 21*  GLUCOSE 116* 111* 84 75  BUN 40* 42* 30* 27*  CREATININE 1.01* 1.10* 0.92 0.88  CALCIUM 8.7*  --  8.8* 9.0   Liver Function Tests: Recent Labs  Lab 10/14/23 1915  AST 23  ALT 16  ALKPHOS 48  BILITOT 0.5  PROT 6.5  ALBUMIN 3.2*   No results for input(s): "LIPASE", "AMYLASE" in the last 168 hours. No results for input(s): "AMMONIA" in the last 168 hours. CBC: Recent Labs  Lab 10/14/23 1915 10/14/23 1944 10/15/23 0704 10/16/23 0546  WBC 7.9  --  7.0 7.7  NEUTROABS 5.2  --   --   --   HGB 10.4* 11.6* 9.1* 10.0*  HCT 31.9* 34.0* 27.4* 30.8*  MCV 85.1  --  83.5 84.8  PLT 189  --  191 184   Cardiac Enzymes: No results for input(s): "CKTOTAL", "CKMB", "CKMBINDEX", "TROPONINI" in the last 168 hours. BNP: BNP (last 3 results) No results for input(s): "BNP" in the last 8760  hours.  ProBNP (last 3 results) No results for input(s): "PROBNP" in the last 8760 hours.  CBG: Recent Labs  Lab 10/15/23 1112 10/15/23 1655 10/15/23 2108 10/16/23 0620 10/16/23 1148  GLUCAP 108* 102* 99 72 148*       Signed:  Ramiro Harvest MD.  [facility] 10/16/2023, 2:50 PM

## 2023-10-16 NOTE — Progress Notes (Signed)
 Jill Rose to be D/C'd Home with home health per MD order.  Discussed with the patient and all questions fully answered.  IV catheter discontinued intact. Site without signs and symptoms of complications. Dressing and pressure applied.  An After Visit Summary was printed and given to the patient. Patient prescription sent to her pharmacy.  D/c education completed with patient/family including follow up instructions, medication list, d/c activities limitations if indicated, with other d/c instructions as indicated by MD - patient able to verbalize understanding, all questions fully answered.   Patient instructed to return to ED, call 911, or call MD for any changes in condition.   Patient escorted via WC, and D/C home via private auto.  Pauletta Browns 10/16/2023 3:52 PM

## 2023-10-16 NOTE — Plan of Care (Signed)
  Problem: Education: Goal: Knowledge of secondary prevention will improve (MUST DOCUMENT ALL) Outcome: Progressing Goal: Knowledge of patient specific risk factors will improve (DELETE if not current risk factor) Outcome: Progressing   Problem: Ischemic Stroke/TIA Tissue Perfusion: Goal: Complications of ischemic stroke/TIA will be minimized Outcome: Progressing

## 2023-10-16 NOTE — TOC Transition Note (Signed)
 Transition of Care [facility]) - Discharge Note   Patient Details  Name: Jill Rose MRN: [id number] Date of Birth: 03/15/1941  Transition of Care [facility]) CM/SW Contact:  Kermit Balo, RN Phone Number: 10/16/2023, 1:53 PM   Clinical Narrative:     Pt is discharging home with home health through [facility]. Information on the AVS.  Pt has transportation home.  Final next level of care: Home w Home Health Services Barriers to Discharge: No Barriers Identified   Patient Goals and CMS Choice   CMS Medicare.gov Compare Post Acute Care list provided to:: Patient Choice offered to / list presented to : Patient      Discharge Placement                       Discharge Plan and Services Additional resources added to the After Visit Summary for     Discharge Planning Services: CM Consult Post Acute Care Choice: Home Health                    HH Arranged: PT, OT [facility] Agency: [facility] Date [facility] Agency Contacted: 10/15/23   Representative spoke with at [facility] Agency: Kandee Keen  Social Drivers of Health (SDOH) Interventions SDOH Screenings   Food Insecurity: No Food Insecurity (10/15/2023)  Housing: Low Risk  (10/15/2023)  Transportation Needs: No Transportation Needs (10/15/2023)  Utilities: Not At Risk (10/15/2023)  Depression (PHQ2-9): Low Risk  (12/14/2020)  Tobacco Use: Low Risk  (10/15/2023)     Readmission Risk Interventions     No data to display

## 2023-11-21 ENCOUNTER — Telehealth: Payer: Self-pay | Admitting: Adult Health

## 2023-11-21 ENCOUNTER — Inpatient Hospital Stay: Admitting: Adult Health

## 2023-11-21 NOTE — Progress Notes (Deleted)
 [facility] [address] [place]. [place] [address] [phone]       OFFICE FOLLOW-UP NOTE  [name] Date of Birth:  03/31/1941 Medical Record Number:  [id number]    Primary neurologist: Dr. Janett Medin Reason for visit: Mixed dementia, hx of stroke   No chief complaint on file.     HPI:   Update 11/21/2023 JM: Patient returns for sooner scheduled visit for hospital follow-up.  She presented to [facility] ED on 10/14/2023 with worsening left-sided weakness.  Workup largely unremarkable for acute abnormalities and felt symptoms in setting of recrudescence of old stroke symptoms in setting of UTI.  She remained on Plavix  and atorvastatin .  Treated for UTI.  Therapies recommended home health therapy.      History provided for reference purposes only Update 09/24/2023 JM: Patient returns for yearly follow-up accompanied by her daughter, Jill Rose.  Overall stable from neurological standpoint without any new stroke/TIA symptoms and reports cognition has been stable.  Remains on Namenda  10 mg twice daily without side effects.  Occasional issues with more short-term memory difficulties such as forgetting that she took a shower 2 days prior or mixing up months of family members birthdays.  Continues to have aide assistance who helps with ADLs.  Transfers via wheelchair, able to stand/pivot for transfers, no recent falls.  Reports she sleeps well and appetite satisfactory.  Received hearing aids back in the spring and daughter feels this has helped some with her memory.  No behavioral concerns.  No new stroke/TIA symptoms.  Compliant on Plavix  and atorvastatin .  Routinely follows with PCP for stroke risk factor management.  Update 09/19/2022 JM: Patient returns for follow-up visit after prior visit with Dr. Janett Medin 6 months ago.  She is accompanied by her caregiver [name]. Cognition has been stable since prior visit.  Remains on Namenda  10 mg twice daily, tolerating well.  No significant  behavioral concerns.  Sleeping well at night and appetite good. Continues to have caregiver assistance during the day and family monitors her at night via cameras.  Denies any new or reoccurring stroke/TIA symptoms.  Remains on Plavix  and atorvastatin .  Blood pressure well-controlled.  No questions or concerns at this time.  Update 03/21/2022 Dr. Janett Medin: She returns for follow-up after last visit 2 months ago with Camilo Cella nurse practitioner.  She is accompanied by her caregiver and also spoke to her daughter over the phone.  Patient seems to be doing all right.  She is cognitively stable.  She remains on Namenda  10 mg twice daily which is tolerating well without side effects.  She gets occasionally disoriented particularly with respect to time but can recognize family members and caregivers on most days.  There are no delusions, hallucinations, behavior agitation or unsafe behavior.  Has no nightmares and sleeps quite well.  She has a caregiver during the day from 8 AM to 8 PM and at night family keeps a close watch remotely cameras.  She stays mostly in bed and does not get out the caregivers arrive in the morning.  Not had any recurrent stroke or TIA symptoms..She remains on Plavix  which she is tolerating well without bleeding or bruising.  Blood pressures under good control.  He is tolerating Lipitor  well without muscle aches complaints.  His sugars have been in the.  Mental status exam today she scored 24/30 which is stable compared to last visit.   Update 01/09/2022 JM: Patient returns for follow-up after prior visit over 1 year ago with Dr. Janett Medin (  although 6 mo f/u visit requested). Accompanied by caregiver. Overall stable.  Reports cognition has been stable since prior visit.  Did work with SLP a few months ago with caregiver noting some improvement of cognition.  MMSE today 23/30 (prior 23/30). remains on Namenda  10 mg twice daily, denies side effects.  Remains sedentary with limited ambulation.  Completed  PT 04/2021 as she plateaued in progress and recommended continued HEP. Requires 24-hour supervision and assistance for all ADLs.  She has caregiver assistance 5 days weekly.  Denies any behavioral concerns.  Sleeps well.  Has been experiencing decreased appetite over the past few months.  Denies new stroke/TIA symptoms.  Compliant on Plavix  and atorvastatin , denies side effects.  Blood pressure today 159/71.  Routinely monitors at home and typically stable.  She is unsure when she last saw PCP.   No further concerns at this time  Update 10/05/2020 Dr. Janett Medin: She returns for follow-up after last visit 4 months ago.  She is accompanied by her son.  She has tolerated Namenda  well without any dizziness, sleepiness or other side effects.  She has noticed improvement in her communication and speaking as well as short-term memory.  She still has trouble with multitasking and gets easy distractibility.  The daughter had some concerns as to whether she was depressed and had called requesting referral to neuropsychologist but I was unable to speak to her when I called and left message.  She still continues to spend most of the time in a wheelchair and she has intense fear of walking and falling and hence does not walk even with a walker.  She is mostly calm and composed and does not have any agitation, delusions or hallucinations.  She requires 24-hour care.  She remains on Plavix  which is tolerating well without bruising or bleeding.  Blood pressure is usually well controlled today it is elevated in office at 183/84.  Sugar and cholesterol have been under good control.  No new complaints today.  Mini-Mental status exam today is 23/30 which is slightly improved from 21/30 in September 21   Update 05/04/2020 Dr. Janett Medin: She returns for follow-up after last visit a month ago.  She is accompanied by her son and husband.  They feel there has been slight regression in her memory and cognitive issues.  She keeps on asking  the same question over and over again.  She has poor appetite and has low energy level.  She has regressed with her walking and refuses to walk and has extreme fear of falling.  She is getting some home physical and occupational therapy and gets up only with assistance.  She did undergo lab work at last visit and B12, RPR and homocystine were normal.  TSH was slightly elevated and she has seen her primary care physician since who has adjusted her thyroid medications.  EEG showed mild generalized slowing with 6 to 7 Hz theta activity throughout.  No definite epileptiform activity was noted.  She is still has some dragging of the right foot for walking and is scared that she will fall.  She does have a walker but does not use it a lot.  She is tolerating Plavix  well without bruising or bleeding.  Blood pressures well controlled today it is 137/72.  Last hemoglobin A1c was 7.3 and fasting sugars are usually in the 150s.  She has no family history of dementia.  She has never tried Aricept or Namenda  like medications but is willing to try them now.  She was seen in the ER briefly a few weeks ago for transient confusion which resolved and she left without any significant work-up being done.  Initial visit 04/08/2020 Dr. Janett Medin: Ms. Ayoub is a 83 year old [place] origin lady who is seen for first office follow-up visit following hospital consultation for stroke.  She is accompanied by her daughter.  History is obtained from them, review of electronic medical records and I personally reviewed pertinent available imaging films in PACS.  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  She presented on 01/05/2020 to [facility] with left-sided weakness which started a week prior to presentation.  She was not able to tell initially whether this was due to her arthritis or a stroke.  She continued to have left-sided problems and  getting in and out of bed in chair and also had some slurred speech eventually prompting hospital visit.  CT scan of the head showed no acute abnormality but old left basal ganglia lacunar infarct.  MRI scan confirmed acute right corona radiata and posterior right temporal and occipital white matter infarcts.  There is also old bilateral corona radiata, left thalamus and left paramedian pontine lacunar infarcts.  MRI of the brain showed multifocal intracranial atherosclerotic changes and MRA of the neck showed 25% right ICA and left vertebral artery atherosclerotic multifocal narrowing.  2D echo showed normal ejection fraction without cardiac source of embolism.  LDL cholesterol of 196 mg percent and hemoglobin A1c was 7.6.  Patient was started on dual antiplatelet therapy aspirin  Plavix  for 3 months and was transferred to inpatient rehab.  She is finished rehab stay is currently living at home.  She is doing outpatient physical occupational therapy.  She is able to walk with a walker.  She is shown some progressive improvement and can get in and out of her couch in bed by herself.  She still has mild residual left-sided weakness but it is improving.  The family feels that the patient is depressed she gets frustrated easily and she feels she is very poor stamina.  He saw primary care physician last week and she was prescribed Lexapro  but she has not yet started it.  She is living at home with her husband.  They have a CNA who comes in for few hours every day.  Patient's husband is also having cognitive issues and daughter is concerned about the long-term care.  Patient also notices increased forgetfulness and short-term memory difficulties.  She has not yet had any lab work for evaluation for reversible causes.         ROS:   14 system review of systems is positive for those listed in HPI and all other systems negative     PMH:  Past Medical History:  Diagnosis Date   Diabetes mellitus without  complication (HCC)    Essential hypertension    High cholesterol    Hypothyroidism    Stroke (HCC)    TIA (transient ischemic attack) 2018    Social History:  Social History   Socioeconomic History   Marital status: Married    Spouse name: John   Number of children: Not on file   Years of education: Not on file   Highest education level: Not on file  Occupational History   Not on file  Tobacco Use   Smoking status: Never   Smokeless tobacco: Never  Vaping Use   Vaping status: Never Used  Substance and Sexual Activity   Alcohol use: No   Drug use: No   Sexual activity: Not on file  Other Topics Concern   Not on file  Social History Narrative   Lives with spouse   Right Handed   Drinks 1 cup caffeine/daily   Social Drivers of Health   Financial Resource Strain: Not on file  Food Insecurity: No Food Insecurity (10/15/2023)   Hunger Vital Sign    Worried About Running Out of Food in the Last Year: Never true    Ran Out of Food in the Last Year: Never true  Transportation Needs: No Transportation Needs (10/15/2023)   PRAPARE - Administrator, Civil Service (Medical): No    Lack of Transportation (Non-Medical): No  Physical Activity: Not on file  Stress: Not on file  Social Connections: Not on file  Intimate Partner Violence: Not At Risk (10/15/2023)   Humiliation, Afraid, Rape, and Kick questionnaire    Fear of Current or Ex-Partner: No    Emotionally Abused: No    Physically Abused: No    Sexually Abused: No    Medications:   Current Outpatient Medications on File Prior to Visit  Medication Sig Dispense Refill   acetaminophen  (TYLENOL ) 325 MG tablet Take 2 tablets (650 mg total) by mouth every 4 (four) hours as needed for mild pain (or temp > 37.5 C (99.5 F)).     amLODipine  (NORVASC ) 5 MG tablet Take 5 mg by mouth daily.     atorvastatin  (LIPITOR ) 80 MG tablet Take 1 tablet (80 mg total) by mouth daily. 90 tablet 0   Cholecalciferol  (VITAMIN D3 PO)  Take 1 tablet by mouth daily with lunch.     clopidogrel  (PLAVIX ) 75 MG tablet Take 1 tablet (75 mg total) by mouth daily. 30 tablet 0   diclofenac  Sodium (VOLTAREN ) 1 % GEL APPLY 2 GRAMS TO AFFECTED AREA 4 TIMES A DAY 300 g 1   escitalopram  (LEXAPRO ) 10 MG tablet Take 10 mg by mouth every morning.     hydrALAZINE  (APRESOLINE ) 10 MG tablet Take 1 tablet (10 mg total) by mouth at bedtime. (Patient taking differently: Take 10 mg by mouth 3 (three) times daily.) 30 tablet 0   levothyroxine  (SYNTHROID ) 100 MCG tablet Take 100 mcg by mouth daily.     Magnesium 250 MG TABS Take 2 tablets by mouth daily. Sugar free gummy     Melatonin 5 MG CHEW Chew 1 tablet by mouth daily. Sugar free gummy     memantine  (NAMENDA ) 10 MG tablet Take 1 tablet (10 mg total) by mouth 2 (two) times daily. 180 tablet 3   Multiple Vitamin (MULTIVITAMIN WITH MINERALS) TABS tablet Take 1 tablet by mouth daily.     silver sulfADIAZINE (SILVADENE) 1 % cream Apply 1 Application topically as needed (wound care).     telmisartan (MICARDIS) 80 MG tablet Take 80 mg by mouth daily.     No current facility-administered medications on file prior to visit.    Allergies:   Allergies  Allergen Reactions   Ammonia Shortness Of Breath, Itching, Swelling and Other (See Comments)    Confusion, Headache     Peroxide [Hydrogen Peroxide] Shortness Of Breath, Itching, Swelling and Other (See Comments)    Confusion, Headache        Physical Exam There were no vitals filed for this visit.  There is no height or weight on file to calculate BMI.  General: well developed, well nourished elderly pleasant [place]  Asian lady, seated, in no evident distress  Neurologic Exam Mental Status: Awake and fully alert. Attention span,concentration and fund of knowledge appropriate during visit.  Mood and affect appropriate.      09/24/2023    1:10 PM 09/19/2022    2:11 PM 03/21/2022    2:25 PM  MMSE - Mini Mental State Exam  Orientation to time 5 3  4   Orientation to Place 5 4 5   Registration 3 3 3   Attention/ Calculation 1 1 2   Recall 2 2 2   Language- name 2 objects 2 2 2   Language- repeat 1 1 0  Language- follow 3 step command 3 3 3   Language- read & follow direction 1 1 1   Write a sentence 1 1 1   Copy design 0 0 1  Total score 24 21 24    Cranial Nerves: Pupils equal, briskly reactive to light. Extraocular movements full without nystagmus. Visual fields full to confrontation. Hearing intact. Facial sensation intact. Face, tongue, palate moves normally and symmetrically.  Motor: Normal bulk and tone. Normal strength in all tested extremity muscles except mild left hemiparesis 4+/5 strength with weakness of left grip and intrinsic hand muscles.  Orbits right over left upper extremity.  Diminished fine finger movements and foot tapping on the left.. Sensory.: intact to touch ,pinprick .position and vibratory sensation.  Coordination: Mildly impaired finger-to-nose and needle coordination on the left. Gait and Station: Deferred  Reflexes: 2+ and asymmetric and brisker on the left. Toes downgoing.       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   -Continue Plavix  and atorvastatin  80 mg daily for secondary stroke prevention managed/prescribed by PCP -Ensure close PCP follow-up for aggressive stroke risk factor management, maintain aggressive risk factor modification strict control of hypertension with blood pressure goal below  130/90, lipids with LDL cholesterol goal below 70 mg percent.   -Lipid panel 02/2023 LDL 106, has f/u with PCP end of this month, request repeat lipid panel and if LDL remains above goal of 70, consider adding Zetia in addition to atorvastatin  80 mg daily or switch to Crestor 40 mg daily but will defer to PCP     Follow-up in 1 year or call earlier if needed     CC:  No primary care provider on file.   I spent 25 minutes of face-to-face and non-face-to-face time with patient and daughter.  This included previsit chart review, lab review, study review, order entry, electronic health record documentation, patient and daughter education and discussion regarding the above and answered all the questions to patient and daughter satisfaction  Johny Nap, [facility]  [facility] [address] [place], [place] [address]  Phone [phone] Fax [phone] Note: This document was prepared with digital dictation and possible smart phrase technology. Any transcriptional errors that result from this process are unintentional.

## 2023-11-21 NOTE — Telephone Encounter (Signed)
 Daughter r/s appointment, she is unable to bring pt today as a result of yesterday pt's spouse being placed under Hospice.

## 2023-12-20 ENCOUNTER — Inpatient Hospital Stay: Admitting: Adult Health

## 2024-02-25 NOTE — Progress Notes (Deleted)
 [facility] [address] [place]. [place] [address] [phone]       OFFICE FOLLOW-UP NOTE  Jill Rose Date of Birth:  [birth year] Medical Record Number:  [id number]    Primary neurologist: Dr. Rosemarie Reason for visit: Mixed dementia, hx of stroke   No chief complaint on file.     HPI:   Update 02/25/2024 JM: Patient is being seen for sooner scheduled visit for hospital follow-up.  She presented to [facility] ED on 10/14/2023 with worsening of left-sided weakness.  MRI negative for acute abnormality and suspected symptoms in setting of recrudescence of old stroke in setting of UTI.  CTA head/neck showed extensive intracranial arthrosclerosis including a progressive, severe basilar artery stenosis and progressive 50% proximal right ICA stenosis.  2D echo showed EF of 70 to 75% with moderate mitral stenosis and mild to moderate aortic valve stenosis -advised outpatient follow-up with cardiology.  LDL 54, A1c 5.3.  Recommend continuation of Plavix  and atorvastatin  80 mg daily.  Remained on home hypertensive regimen.  UTI treated with Rocephin  and advised outpatient PCP follow-up.  Therapies recommended home health therapy.       History provided for reference purposes only Update 09/24/2023 JM: Patient returns for yearly follow-up accompanied by her daughter, Jill Rose.  Overall stable from neurological standpoint without any new stroke/TIA symptoms and reports cognition has been stable.  Remains on Namenda  10 mg twice daily without side effects.  Occasional issues with more short-term memory difficulties such as forgetting that she took a shower 2 days prior or mixing up months of family members birthdays.  Continues to have aide assistance who helps with ADLs.  Transfers via wheelchair, able to stand/pivot for transfers, no recent falls.  Reports she sleeps well and appetite satisfactory.  Received hearing aids back in the spring and daughter feels this has helped some with  her memory.  No behavioral concerns.  No new stroke/TIA symptoms.  Compliant on Plavix  and atorvastatin .  Routinely follows with PCP for stroke risk factor management.  Update 09/19/2022 JM: Patient returns for follow-up visit after prior visit with Dr. Rosemarie 81 months ago.  She is accompanied by her caregiver Jill Rose. Cognition has been stable since prior visit.  Remains on Namenda  10 mg twice daily, tolerating well.  No significant behavioral concerns.  Sleeping well at night and appetite good. Continues to have caregiver assistance during the day and family monitors her at night via cameras.  Denies any new or reoccurring stroke/TIA symptoms.  Remains on Plavix  and atorvastatin .  Blood pressure well-controlled.  No questions or concerns at this time.  Update 03/21/2022 Dr. Rosemarie: She returns for follow-up after last visit 2 months ago with [name] nurse practitioner.  She is accompanied by her caregiver and also spoke to her daughter over the phone.  Patient seems to be doing all right.  She is cognitively stable.  She remains on Namenda  10 mg twice daily which is tolerating well without side effects.  She gets occasionally disoriented particularly with respect to time but can recognize family members and caregivers on most days.  There are no delusions, hallucinations, behavior agitation or unsafe behavior.  Has no nightmares and sleeps quite well.  She has a caregiver during the day from 8 AM to 8 PM and at night family keeps a close watch remotely cameras.  She stays mostly in bed and does not get out the caregivers arrive in the morning.  Not had any recurrent stroke or TIA symptoms..She remains  on Plavix  which she is tolerating well without bleeding or bruising.  Blood pressures under good control.  He is tolerating Lipitor  well without muscle aches complaints.  His sugars have been in the.  Mental status exam today she scored 24/30 which is stable compared to last visit.   Update 01/09/2022 JM: Patient  returns for follow-up after prior visit over 1 year ago with Dr. Rosemarie (although 6 mo f/u visit requested). Accompanied by caregiver. Overall stable.  Reports cognition has been stable since prior visit.  Did work with SLP a few months ago with caregiver noting some improvement of cognition.  MMSE today 23/30 (prior 23/30). remains on Namenda  10 mg twice daily, denies side effects.  Remains sedentary with limited ambulation.  Completed PT 04/2021 as she plateaued in progress and recommended continued HEP. Requires 24-hour supervision and assistance for all ADLs.  She has caregiver assistance 5 days weekly.  Denies any behavioral concerns.  Sleeps well.  Has been experiencing decreased appetite over the past few months.  Denies new stroke/TIA symptoms.  Compliant on Plavix  and atorvastatin , denies side effects.  Blood pressure today 159/71.  Routinely monitors at home and typically stable.  She is unsure when she last saw PCP.   No further concerns at this time  Update 10/05/2020 Dr. Rosemarie: She returns for follow-up after last visit 4 months ago.  She is accompanied by her son.  She has tolerated Namenda  well without any dizziness, sleepiness or other side effects.  She has noticed improvement in her communication and speaking as well as short-term memory.  She still has trouble with multitasking and gets easy distractibility.  The daughter had some concerns as to whether she was depressed and had called requesting referral to neuropsychologist but I was unable to speak to her when I called and left message.  She still continues to spend most of the time in a wheelchair and she has intense fear of walking and falling and hence does not walk even with a walker.  She is mostly calm and composed and does not have any agitation, delusions or hallucinations.  She requires 24-hour care.  She remains on Plavix  which is tolerating well without bruising or bleeding.  Blood pressure is usually well controlled today it is  elevated in office at 183/84.  Sugar and cholesterol have been under good control.  No new complaints today.  Mini-Mental status exam today is 23/30 which is slightly improved from 21/30 in September 21   Update 05/04/2020 Dr. Rosemarie: She returns for follow-up after last visit a month ago.  She is accompanied by her son and husband.  They feel there has been slight regression in her memory and cognitive issues.  She keeps on asking the same question over and over again.  She has poor appetite and has low energy level.  She has regressed with her walking and refuses to walk and has extreme fear of falling.  She is getting some home physical and occupational therapy and gets up only with assistance.  She did undergo lab work at last visit and B12, RPR and homocystine were normal.  TSH was slightly elevated and she has seen her primary care physician since who has adjusted her thyroid medications.  EEG showed mild generalized slowing with 6 to 7 Hz theta activity throughout.  No definite epileptiform activity was noted.  She is still has some dragging of the right foot for walking and is scared that she will fall.  She does have  a walker but does not use it a lot.  She is tolerating Plavix  well without bruising or bleeding.  Blood pressures well controlled today it is 137/72.  Last hemoglobin A1c was 7.3 and fasting sugars are usually in the 150s.  She has no family history of dementia.  She has never tried Aricept or Namenda  like medications but is willing to try them now.  She was seen in the ER briefly a few weeks ago for transient confusion which resolved and she left without any significant work-up being done.  Initial visit 04/08/2020 Dr. Rosemarie: Ms. Vasudevan is a 83 year old [place] origin lady who is seen for first office follow-up visit following hospital consultation for stroke.  She is accompanied by her daughter.  History is obtained from them, review of electronic medical records and I  personally reviewed pertinent available imaging films in PACS.  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  She presented on 01/05/2020 to [facility] with left-sided weakness which started a week prior to presentation.  She was not able to tell initially whether this was due to her arthritis or a stroke.  She continued to have left-sided problems and getting in and out of bed in chair and also had some slurred speech eventually prompting hospital visit.  CT scan of the head showed no acute abnormality but old left basal ganglia lacunar infarct.  MRI scan confirmed acute right corona radiata and posterior right temporal and occipital white matter infarcts.  There is also old bilateral corona radiata, left thalamus and left paramedian pontine lacunar infarcts.  MRI of the brain showed multifocal intracranial atherosclerotic changes and MRA of the neck showed 25% right ICA and left vertebral artery atherosclerotic multifocal narrowing.  2D echo showed normal ejection fraction without cardiac source of embolism.  LDL cholesterol of 196 mg percent and hemoglobin A1c was 7.6.  Patient was started on dual antiplatelet therapy aspirin  Plavix  for 3 months and was transferred to inpatient rehab.  She is finished rehab stay is currently living at home.  She is doing outpatient physical occupational therapy.  She is able to walk with a walker.  She is shown some progressive improvement and can get in and out of her couch in bed by herself.  She still has mild residual left-sided weakness but it is improving.  The family feels that the patient is depressed she gets frustrated easily and she feels she is very poor stamina.  He saw primary care physician last week and she was prescribed Lexapro  but she has not yet started it.  She is living at home with her husband.  They have a CNA who comes in for few hours every day.  Patient's husband is  also having cognitive issues and daughter is concerned about the long-term care.  Patient also notices increased forgetfulness and short-term memory difficulties.  She has not yet had any lab work for evaluation for reversible causes.         ROS:   14 system review of systems is positive for those listed in HPI and all other systems negative     PMH:  Past Medical History:  Diagnosis Date   Diabetes mellitus without complication (HCC)    Essential hypertension    High cholesterol    Hypothyroidism    Stroke [facility])    TIA (transient ischemic attack) 2018    Social History:  Social History  Socioeconomic History   Marital status: Married    Spouse name: John   Number of children: Not on file   Years of education: Not on file   Highest education level: Not on file  Occupational History   Not on file  Tobacco Use   Smoking status: Never   Smokeless tobacco: Never  Vaping Use   Vaping status: Never Used  Substance and Sexual Activity   Alcohol use: No   Drug use: No   Sexual activity: Not on file  Other Topics Concern   Not on file  Social History Narrative   Lives with spouse   Right Handed   Drinks 1 cup caffeine/daily   Social Drivers of Health   Financial Resource Strain: Not on file  Food Insecurity: No Food Insecurity (10/15/2023)   Hunger Vital Sign    Worried About Running Out of Food in the Last Year: Never true    Ran Out of Food in the Last Year: Never true  Transportation Needs: No Transportation Needs (10/15/2023)   PRAPARE - Administrator, Civil Service (Medical): No    Lack of Transportation (Non-Medical): No  Physical Activity: Not on file  Stress: Not on file  Social Connections: Not on file  Intimate Partner Violence: Not At Risk (10/15/2023)   Humiliation, Afraid, Rape, and Kick questionnaire    Fear of Current or Ex-Partner: No    Emotionally Abused: No    Physically Abused: No    Sexually Abused: No    Medications:    Current Outpatient Medications on File Prior to Visit  Medication Sig Dispense Refill   acetaminophen  (TYLENOL ) 325 MG tablet Take 2 tablets (650 mg total) by mouth every 4 (four) hours as needed for mild pain (or temp > 37.5 C (99.5 F)).     amLODipine  (NORVASC ) 5 MG tablet Take 5 mg by mouth daily.     atorvastatin  (LIPITOR ) 80 MG tablet Take 1 tablet (80 mg total) by mouth daily. 90 tablet 0   Cholecalciferol  (VITAMIN D3 PO) Take 1 tablet by mouth daily with lunch.     clopidogrel  (PLAVIX ) 75 MG tablet Take 1 tablet (75 mg total) by mouth daily. 30 tablet 0   diclofenac  Sodium (VOLTAREN ) 1 % GEL APPLY 2 GRAMS TO AFFECTED AREA 4 TIMES A DAY 300 g 1   escitalopram  (LEXAPRO ) 10 MG tablet Take 10 mg by mouth every morning.     hydrALAZINE  (APRESOLINE ) 10 MG tablet Take 1 tablet (10 mg total) by mouth at bedtime. (Patient taking differently: Take 10 mg by mouth 3 (three) times daily.) 30 tablet 0   levothyroxine  (SYNTHROID ) 100 MCG tablet Take 100 mcg by mouth daily.     Magnesium 250 MG TABS Take 2 tablets by mouth daily. Sugar free gummy     Melatonin 5 MG CHEW Chew 1 tablet by mouth daily. Sugar free gummy     memantine  (NAMENDA ) 10 MG tablet Take 1 tablet (10 mg total) by mouth 2 (two) times daily. 180 tablet 3   Multiple Vitamin (MULTIVITAMIN WITH MINERALS) TABS tablet Take 1 tablet by mouth daily.     silver sulfADIAZINE (SILVADENE) 1 % cream Apply 1 Application topically as needed (wound care).     telmisartan (MICARDIS) 80 MG tablet Take 80 mg by mouth daily.     No current facility-administered medications on file prior to visit.    Allergies:   Allergies  Allergen Reactions   Ammonia Shortness Of Breath, Itching, Swelling  and Other (See Comments)    Confusion, Headache     Peroxide [Hydrogen Peroxide] Shortness Of Breath, Itching, Swelling and Other (See Comments)    Confusion, Headache        Physical Exam There were no vitals filed for this visit.  There is no height  or weight on file to calculate BMI.  General: well developed, well nourished elderly pleasant [place] lady, seated, in no evident distress  Neurologic Exam Mental Status: Awake and fully alert. Attention span,concentration and fund of knowledge appropriate during visit.  Mood and affect appropriate.      09/24/2023    1:10 PM 09/19/2022    2:11 PM 03/21/2022    2:25 PM  MMSE - Mini Mental State Exam  Orientation to time 5 3 4   Orientation to Place 5 4 5   Registration 3 3 3   Attention/ Calculation 1 1 2   Recall 2 2 2   Language- name 2 objects 2 2 2   Language- repeat 1 1 0  Language- follow 3 step command 3 3 3   Language- read & follow direction 1 1 1   Write a sentence 1 1 1   Copy design 0 0 1  Total score 24 21 24    Cranial Nerves: Pupils equal, briskly reactive to light. Extraocular movements full without nystagmus. Visual fields full to confrontation. Hearing intact. Facial sensation intact. Face, tongue, palate moves normally and symmetrically.  Motor: Normal bulk and tone. Normal strength in all tested extremity muscles except mild left hemiparesis 4+/5 strength with weakness of left grip and intrinsic hand muscles.  Orbits right over left upper extremity.  Diminished fine finger movements and foot tapping on the left.. Sensory.: intact to touch ,pinprick .position and vibratory sensation.  Coordination: Mildly impaired finger-to-nose and needle coordination on the left. Gait and Station: Deferred  Reflexes: 2+ and asymmetric and brisker on the left. Toes downgoing.       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 Multiple vascular risk factors of diabetes, hypertension, hyperlipidemia, right carotid stenosis and extensive progressive intracranial stenosis.   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 which has shown some response to Namenda  and stable over the past year     Right MCA stroke Residual left-sided weakness Continue clopidogrel  and atorvastatin  80 mg daily for secondary stroke prevention managed/prescribed by PCP Continue close PCP follow-up for aggressive stroke risk factor management including BP goal<130/90 with avoidance of hypotension, HLD with LDL goal<70 and DM with A1c.<7  Stroke labs 09/2023: LDL 54, A1c 5.3\  Right carotid stenosis Obtain carotid ultrasound next month for surveillance monitoring CTA neck 09/2023 R ICA 50% stenosis, L ICA mixed calcified and soft plaque without evidence of significant stenosis  Mixed dementia MMSE 23/30 (09/2023) Continue Namenda  10 mg twice daily - refill provided increase participation in cognitively challenging activities.         Follow-up in 1 year or call earlier if needed     CC:  No primary care provider on file.   I personally spent a total of *** minutes in the care of the patient today including {Time Based Coding:210964241}.  [name] Bogaert, AGNP-BC  [facility] [address] [place], [place] [address]  Phone [phone] Fax [phone] Note: This document was prepared with digital dictation and possible  smart Lobbyist. Any transcriptional errors that result from this process are unintentional.

## 2024-02-26 ENCOUNTER — Encounter: Payer: Self-pay | Admitting: Adult Health

## 2024-02-26 ENCOUNTER — Inpatient Hospital Stay: Admitting: Adult Health

## 2024-02-26 ENCOUNTER — Telehealth: Payer: Self-pay | Admitting: Adult Health

## 2024-02-26 NOTE — Telephone Encounter (Signed)
 Patient's daughter reschedule appointment due to patient not feeling well

## 2024-03-18 ENCOUNTER — Telehealth: Payer: Self-pay | Admitting: Adult Health

## 2024-03-18 NOTE — Telephone Encounter (Signed)
 Patient's daughter,  [name] Late called to reschedule appointment due to have to car pool kids to school.  Informed Ms. Zacharias of next available. Ms. Zacharias stated, put her on the waitlist and if do not get an appointment any earlier will find another neurologist.

## 2024-03-25 ENCOUNTER — Inpatient Hospital Stay: Admitting: Adult Health

## 2024-06-19 ENCOUNTER — Inpatient Hospital Stay: Admitting: Adult Health

## 2024-07-21 ENCOUNTER — Inpatient Hospital Stay: Admitting: Adult Health

## 2024-07-24 NOTE — Progress Notes (Signed)
 " [facility] [address] [place]. [place] [address] [phone]       OFFICE FOLLOW-UP NOTE  [name] Date of Birth:  August 10, 1940 Medical Record Number:  [id number]    Primary neurologist: Dr. Rosemarie Reason for visit: Mixed dementia, hx of stroke   Chief Complaint  Patient presents with   Follow-up    Pt in room 8. Daughter in room. Here for hospital follow up for TIA on 10/14/23. Daughter reports no changes.       HPI:   Update 07/25/2024 JM: Patient returns for hospital follow-up visit that occurred almost 1 year ago after multiple cancellations.  He is accompanied by her daughter.  She was seen in the ED on 10/14/2023 for increased left-sided weakness compared to baseline.  CT head and MRI brain negative for acute abnormality.  CTA head/neck negative LVO.  2D echo  WNL.  Urinalysis concerning for UTI.  Suspected symptoms in setting of TIA vs recrudescence of prior stroke symptoms in setting of UTI.  LDL 54, A1c 5.3.  She continued on Plavix  and statin and recommended outpatient follow-up.  Since that time, she has been doing well without any new or worsening stroke/TIA symptoms.  Believes cognition overall has been stable without any significant decline, still occasional short-term memory difficulties, continues memantine  10 mg twice daily without side effects.  Primarily transfers via wheelchair, able to stand/pivot for transfers, no recent falls.  Unfortunately, her husband passed away back in 2024/12/27, she continues to live in her home, has aide assistance 12 hrs per day, son lives 5 minutes away, daughter lives in [place].  Continues on Plavix  and atorvastatin  without side effects.  Blood pressure well-controlled.  She has been able to come off her metformin  and hydralazine  as blood pressure and diabetes well-controlled.  She has follow-up visit with PCP at the end of this month for routine follow-up.  No questions or concerns at this    History provided for  reference purposes only Update 09/24/2023 JM: Patient returns for yearly follow-up accompanied by her daughter, [name].  Overall stable from neurological standpoint without any new stroke/TIA symptoms and reports cognition has been stable.  Remains on Namenda  10 mg twice daily without side effects.  Occasional issues with more short-term memory difficulties such as forgetting that she took a shower 2 days prior or mixing up months of family members birthdays.  Continues to have aide assistance who helps with ADLs.  Transfers via wheelchair, able to stand/pivot for transfers, no recent falls.  Reports she sleeps well and appetite satisfactory.  Received hearing aids back in the spring and daughter feels this has helped some with her memory.  No behavioral concerns.  No new stroke/TIA symptoms.  Compliant on Plavix  and atorvastatin .  Routinely follows with PCP for stroke risk factor management.  Update 09/19/2022 JM: Patient returns for follow-up visit after prior visit with Dr. Rosemarie 6 months ago.  She is accompanied by her caregiver Latresa. Cognition has been stable since prior visit.  Remains on Namenda  10 mg twice daily, tolerating well.  No significant behavioral concerns.  Sleeping well at night and appetite good. Continues to have caregiver assistance during the day and family monitors her at night via cameras.  Denies any new or reoccurring stroke/TIA symptoms.  Remains on Plavix  and atorvastatin .  Blood pressure well-controlled.  No questions or concerns at this time.  Update 03/21/2022 Dr. Rosemarie: She returns for follow-up after last visit 2 months ago with [name] nurse  practitioner.  She is accompanied by her caregiver and also spoke to her daughter over the phone.  Patient seems to be doing all right.  She is cognitively stable.  She remains on Namenda  10 mg twice daily which is tolerating well without side effects.  She gets occasionally disoriented particularly with respect to time but can recognize  family members and caregivers on most days.  There are no delusions, hallucinations, behavior agitation or unsafe behavior.  Has no nightmares and sleeps quite well.  She has a caregiver during the day from 8 AM to 8 PM and at night family keeps a close watch remotely cameras.  She stays mostly in bed and does not get out the caregivers arrive in the morning.  Not had any recurrent stroke or TIA symptoms..She remains on Plavix  which she is tolerating well without bleeding or bruising.  Blood pressures under good control.  He is tolerating Lipitor  well without muscle aches complaints.  His sugars have been in the.  Mental status exam today she scored 24/30 which is stable compared to last visit.   Update 01/09/2022 JM: Patient returns for follow-up after prior visit over 1 year ago with Dr. Rosemarie (although 6 mo f/u visit requested). Accompanied by caregiver. Overall stable.  Reports cognition has been stable since prior visit.  Did work with SLP a few months ago with caregiver noting some improvement of cognition.  MMSE today 23/30 (prior 23/30). remains on Namenda  10 mg twice daily, denies side effects.  Remains sedentary with limited ambulation.  Completed PT 04/2021 as she plateaued in progress and recommended continued HEP. Requires 24-hour supervision and assistance for all ADLs.  She has caregiver assistance 5 days weekly.  Denies any behavioral concerns.  Sleeps well.  Has been experiencing decreased appetite over the past few months.  Denies new stroke/TIA symptoms.  Compliant on Plavix  and atorvastatin , denies side effects.  Blood pressure today 159/71.  Routinely monitors at home and typically stable.  She is unsure when she last saw PCP.   No further concerns at this time  Update 10/05/2020 Dr. Rosemarie: She returns for follow-up after last visit 4 months ago.  She is accompanied by her son.  She has tolerated Namenda  well without any dizziness, sleepiness or other side effects.  She has noticed  improvement in her communication and speaking as well as short-term memory.  She still has trouble with multitasking and gets easy distractibility.  The daughter had some concerns as to whether she was depressed and had called requesting referral to neuropsychologist but I was unable to speak to her when I called and left message.  She still continues to spend most of the time in a wheelchair and she has intense fear of walking and falling and hence does not walk even with a walker.  She is mostly calm and composed and does not have any agitation, delusions or hallucinations.  She requires 24-hour care.  She remains on Plavix  which is tolerating well without bruising or bleeding.  Blood pressure is usually well controlled today it is elevated in office at 183/84.  Sugar and cholesterol have been under good control.  No new complaints today.  Mini-Mental status exam today is 23/30 which is slightly improved from 21/30 in September 21   Update 05/04/2020 Dr. Rosemarie: She returns for follow-up after last visit a month ago.  She is accompanied by her son and husband.  They feel there has been slight regression in her memory and cognitive issues.  She keeps on asking the same question over and over again.  She has poor appetite and has low energy level.  She has regressed with her walking and refuses to walk and has extreme fear of falling.  She is getting some home physical and occupational therapy and gets up only with assistance.  She did undergo lab work at last visit and B12, RPR and homocystine were normal.  TSH was slightly elevated and she has seen her primary care physician since who has adjusted her thyroid medications.  EEG showed mild generalized slowing with 6 to 7 Hz theta activity throughout.  No definite epileptiform activity was noted.  She is still has some dragging of the right foot for walking and is scared that she will fall.  She does have a walker but does not use it a lot.  She is tolerating  Plavix  well without bruising or bleeding.  Blood pressures well controlled today it is 137/72.  Last hemoglobin A1c was 7.3 and fasting sugars are usually in the 150s.  She has no family history of dementia.  She has never tried Aricept or Namenda  like medications but is willing to try them now.  She was seen in the ER briefly a few weeks ago for transient confusion which resolved and she left without any significant work-up being done.  Initial visit 04/08/2020 Dr. Rosemarie: Ms. Millikan is a 84 year old South Asian Indian origin lady who is seen for first office follow-up visit following hospital consultation for stroke.  She is accompanied by her daughter.  History is obtained from them, review of electronic medical records and I personally reviewed pertinent available imaging films in PACS.  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  She presented on 01/05/2020 to [facility] with left-sided weakness which started a week prior to presentation.  She was not able to tell initially whether this was due to her arthritis or a stroke.  She continued to have left-sided problems and getting in and out of bed in chair and also had some slurred speech eventually prompting hospital visit.  CT scan of the head showed no acute abnormality but old left basal ganglia lacunar infarct.  MRI scan confirmed acute right corona radiata and posterior right temporal and occipital white matter infarcts.  There is also old bilateral corona radiata, left thalamus and left paramedian pontine lacunar infarcts.  MRI of the brain showed multifocal intracranial atherosclerotic changes and MRA of the neck showed 25% right ICA and left vertebral artery atherosclerotic multifocal narrowing.  2D echo showed normal ejection fraction without cardiac source of embolism.  LDL cholesterol of 196 mg percent and hemoglobin A1c was 7.6.  Patient was started on dual  antiplatelet therapy aspirin  Plavix  for 3 months and was transferred to inpatient rehab.  She is finished rehab stay is currently living at home.  She is doing outpatient physical occupational therapy.  She is able to walk with a walker.  She is shown some progressive improvement and can get in and out of her couch in bed by herself.  She still has mild residual left-sided weakness but it is improving.  The family feels that the patient is depressed she gets frustrated easily and she feels she is very poor stamina.  He saw primary care physician last week and she was prescribed Lexapro  but she has not yet started it.  She is living at home with her husband.  They  have a CNA who comes in for few hours every day.  Patient's husband is also having cognitive issues and daughter is concerned about the long-term care.  Patient also notices increased forgetfulness and short-term memory difficulties.  She has not yet had any lab work for evaluation for reversible causes.         ROS:   14 system review of systems is positive for those listed in HPI and all other systems negative     PMH:  Past Medical History:  Diagnosis Date   Diabetes mellitus without complication (HCC)    Essential hypertension    High cholesterol    Hypothyroidism    Stroke (HCC)    TIA (transient ischemic attack) 2018    Social History:  Social History   Socioeconomic History   Marital status: Married    Spouse name: John   Number of children: Not on file   Years of education: Not on file   Highest education level: Not on file  Occupational History   Not on file  Tobacco Use   Smoking status: Never   Smokeless tobacco: Never  Vaping Use   Vaping status: Never Used  Substance and Sexual Activity   Alcohol use: No   Drug use: No   Sexual activity: Not on file  Other Topics Concern   Not on file  Social History Narrative   Lives with spouse   Right Handed   Drinks 1 cup caffeine/daily   Social Drivers  of Health   Tobacco Use: Low Risk (07/25/2024)   Patient History    Smoking Tobacco Use: Never    Smokeless Tobacco Use: Never    Passive Exposure: Not on file  Financial Resource Strain: Not on file  Food Insecurity: No Food Insecurity (10/15/2023)   Hunger Vital Sign    Worried About Running Out of Food in the Last Year: Never true    Ran Out of Food in the Last Year: Never true  Transportation Needs: No Transportation Needs (10/15/2023)   PRAPARE - Administrator, Civil Service (Medical): No    Lack of Transportation (Non-Medical): No  Physical Activity: Not on file  Stress: Not on file  Social Connections: Not on file  Intimate Partner Violence: Not At Risk (10/15/2023)   Humiliation, Afraid, Rape, and Kick questionnaire    Fear of Current or Ex-Partner: No    Emotionally Abused: No    Physically Abused: No    Sexually Abused: No  Depression (PHQ2-9): Not on file  Alcohol Screen: Not on file  Housing: Low Risk (10/15/2023)   Housing Stability Vital Sign    Unable to Pay for Housing in the Last Year: No    Number of Times Moved in the Last Year: 0    Homeless in the Last Year: No  Utilities: Not At Risk (10/15/2023)   AHC Utilities    Threatened with loss of utilities: No  Health Literacy: Not on file    Medications:   Current Outpatient Medications on File Prior to Visit  Medication Sig Dispense Refill   acetaminophen  (TYLENOL ) 325 MG tablet Take 2 tablets (650 mg total) by mouth every 4 (four) hours as needed for mild pain (or temp > 37.5 C (99.5 F)).     amLODipine  (NORVASC ) 5 MG tablet Take 5 mg by mouth daily.     atorvastatin  (LIPITOR ) 80 MG tablet Take 1 tablet (80 mg total) by mouth daily. 90 tablet 0   Cholecalciferol  (VITAMIN D3  PO) Take 1 tablet by mouth daily with lunch.     clopidogrel  (PLAVIX ) 75 MG tablet Take 1 tablet (75 mg total) by mouth daily. 30 tablet 0   diclofenac  Sodium (VOLTAREN ) 1 % GEL APPLY 2 GRAMS TO AFFECTED AREA 4 TIMES A DAY 300 g  1   escitalopram  (LEXAPRO ) 10 MG tablet Take 10 mg by mouth every morning.     levothyroxine  (SYNTHROID ) 100 MCG tablet Take 100 mcg by mouth daily.     Magnesium 250 MG TABS Take 2 tablets by mouth daily. Sugar free gummy     Melatonin 5 MG CHEW Chew 1 tablet by mouth daily. Sugar free gummy     Multiple Vitamin (MULTIVITAMIN WITH MINERALS) TABS tablet Take 1 tablet by mouth daily.     silver sulfADIAZINE (SILVADENE) 1 % cream Apply 1 Application topically as needed (wound care).     telmisartan (MICARDIS) 80 MG tablet Take 80 mg by mouth daily.     No current facility-administered medications on file prior to visit.    Allergies:   Allergies  Allergen Reactions   Ammonia Shortness Of Breath, Itching, Swelling and Other (See Comments)    Confusion, Headache     Peroxide [Hydrogen Peroxide] Shortness Of Breath, Itching, Swelling and Other (See Comments)    Confusion, Headache        Physical Exam Today's Vitals   07/25/24 0957  BP: 135/68  Pulse: 69   There is no height or weight on file to calculate BMI.  General: well developed, well nourished elderly pleasant South Asian lady, seated, in no evident distress  Neurologic Exam Mental Status: Awake and fully alert. Attention span,concentration and fund of knowledge appropriate during visit.  Mood and affect appropriate.      07/25/2024   10:13 AM 09/24/2023    1:10 PM 09/19/2022    2:11 PM  MMSE - Mini Mental State Exam  Orientation to time 2 5 3   Orientation to Place 5 5 4   Registration 3 3 3   Attention/ Calculation 1 1 1   Recall 3 2 2   Language- name 2 objects 2 2 2   Language- repeat 0 1 1  Language- follow 3 step command 3 3 3   Language- read & follow direction 1 1 1   Write a sentence 1 1 1   Copy design 0 0 0  Total score 21 24 21    Cranial Nerves: Pupils equal, briskly reactive to light. Extraocular movements full without nystagmus. Visual fields full to confrontation. HOH, hearing aides bilaterally.  Facial  sensation intact. Face, tongue, palate moves normally and symmetrically.  Motor: Normal bulk and tone. Normal strength in all tested extremity muscles except mild left hemiparesis 4+/5 strength with weakness of left grip and intrinsic hand muscles.  Orbits right over left upper extremity.  Diminished fine finger movements and foot tapping on the left.. Sensory.: intact to touch ,pinprick .position and vibratory sensation.  Coordination: Mildly impaired finger-to-nose and needle coordination on the left. Gait and Station: Deferred  Reflexes: 2+ and asymmetric and brisker on the left. Toes downgoing.          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  Vascular risk factors of diabetes, hypertension, hyperlipidemia and atherosclerosis.  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   -Continue Plavix  and atorvastatin  80 mg daily for secondary stroke prevention managed/prescribed by PCP -Ensure close PCP follow-up for aggressive stroke risk factor management, maintain aggressive risk factor modification strict control of hypertension with blood pressure goal below 130/90, lipids with LDL cholesterol goal below 70 mg percent.        Follow-up in 1 year or call earlier if needed     CC:  Justino, Jill Carte, FNP     [name] Bogaert, AGNP-BC  [facility] [address] [place], [place] [address]  Phone [phone] Fax [phone] Note:  This document was prepared with digital dictation and possible smart phrase technology. Any transcriptional errors that result from this process are unintentional. "

## 2024-07-25 ENCOUNTER — Encounter: Payer: Self-pay | Admitting: Adult Health

## 2024-07-25 ENCOUNTER — Ambulatory Visit: Admitting: Adult Health

## 2024-07-25 VITALS — BP 135/68 | HR 69

## 2024-07-25 DIAGNOSIS — G309 Alzheimer's disease, unspecified: Secondary | ICD-10-CM

## 2024-07-25 DIAGNOSIS — F0150 Vascular dementia without behavioral disturbance: Secondary | ICD-10-CM | POA: Diagnosis not present

## 2024-07-25 DIAGNOSIS — Z8673 Personal history of transient ischemic attack (TIA), and cerebral infarction without residual deficits: Secondary | ICD-10-CM

## 2024-07-25 DIAGNOSIS — F0280 Dementia in other diseases classified elsewhere without behavioral disturbance: Secondary | ICD-10-CM | POA: Diagnosis not present

## 2024-07-25 MED ORDER — MEMANTINE HCL 10 MG PO TABS: 10.0000 mg | ORAL_TABLET | Freq: Two times a day (BID) | ORAL | 4 refills | Status: AC

## 2024-07-25 NOTE — Patient Instructions (Addendum)
 Overall memory has been stable which is great! Please continue Namenda  10mg  twice daily   Continue clopidogrel  75 mg daily and atorvastatin  for secondary stroke prevention  Continue to follow up with PCP regarding blood pressure and cholesterol management  Maintain strict control of hypertension with blood pressure goal below 130/90 and cholesterol with LDL cholesterol (bad cholesterol) goal below 70 mg/dL.   Signs of a Stroke? Follow the BEFAST method:  Balance Watch for a sudden loss of balance, trouble with coordination or vertigo Eyes Is there a sudden loss of vision in one or both eyes? Or double vision?  Face: Ask the person to smile. Does one side of the face droop or is it numb?  Arms: Ask the person to raise both arms. Does one arm drift downward? Is there weakness or numbness of a leg? Speech: Ask the person to repeat a simple phrase. Does the speech sound slurred/strange? Is the person confused ? Time: If you observe any of these signs, call 911.      Followup in the future with me in 1 year or call earlier if needed       Thank you for coming to see us  at [facility]. I hope we have been able to provide you high quality care today.  You may receive a patient satisfaction survey over the next few weeks. We would appreciate your feedback and comments so that we may continue to improve ourselves and the health of our patients.

## 2025-07-28 ENCOUNTER — Ambulatory Visit: Admitting: Adult Health
